# Patient Record
Sex: Male | Born: 1960
Health system: Southern US, Community
[De-identification: ages and names within clinical notes are randomized; demographics above are authoritative.]

## PROBLEM LIST (undated history)

## (undated) DIAGNOSIS — E785 Hyperlipidemia, unspecified: Secondary | ICD-10-CM

## (undated) DIAGNOSIS — I639 Cerebral infarction, unspecified: Secondary | ICD-10-CM

## (undated) DIAGNOSIS — I10 Essential (primary) hypertension: Secondary | ICD-10-CM

## (undated) DIAGNOSIS — M109 Gout, unspecified: Secondary | ICD-10-CM

## (undated) HISTORY — DX: Hyperlipidemia, unspecified: E78.5

## (undated) HISTORY — DX: Essential (primary) hypertension: I10

## (undated) HISTORY — PX: HERNIA REPAIR: SHX51

## (undated) HISTORY — PX: NO PAST SURGERIES: SHX2092

## (undated) HISTORY — DX: Cerebral infarction, unspecified: I63.9

## (undated) HISTORY — DX: Gout, unspecified: M10.9

---

## 2009-10-29 ENCOUNTER — Encounter: Payer: Self-pay | Admitting: Internal Medicine

## 2010-04-11 NOTE — Letter (Signed)
Summary: Previsit letter  [facility]  [address] [place], [place] [address]   Phone: [phone]  Fax: [phone]       10/29/2009 MRN: [id number]  Bruce Guerrero [address] [place], [place]  [address]  Dear Mr. Atienza,  Welcome to the Gastroenterology Division at [facility].    You are scheduled to see a nurse for your pre-procedure visit on 12-14-09 at 1:30pm on the 3rd floor at [facility], [address]  We ask that you try to arrive at our office 15 minutes prior to your appointment time to allow for check-in.  Your nurse visit will consist of discussing your medical and surgical history, your immediate family medical history, and your medications.    Please bring a complete list of all your medications or, if you prefer, bring the medication bottles and we will list them.  We will need to be aware of both prescribed and over the counter drugs.  We will need to know exact dosage information as well.  If you are on blood thinners (Coumadin, Plavix, Aggrenox, Ticlid, etc.) please call our office today/prior to your appointment, as we need to consult with your physician about holding your medication.   Please be prepared to read and sign documents such as consent forms, a financial agreement, and acknowledgement forms.  If necessary, and with your consent, a friend or relative is welcome to sit-in on the nurse visit with you.  Please bring your insurance card so that we may make a copy of it.  If your insurance requires a referral to see a specialist, please bring your referral form from your primary care physician.  No co-pay is required for this nurse visit.     If you cannot keep your appointment, please call [phone] to cancel or reschedule prior to your appointment date.  This allows [name] the opportunity to schedule an appointment for another patient in need of care.    Thank you for choosing [facility] for your medical needs.   We appreciate the opportunity to care for you.  Please visit [name] at our website  to learn more about our practice.                     Sincerely.                                                                                                                   The Gastroenterology Division

## 2012-03-10 DIAGNOSIS — I639 Cerebral infarction, unspecified: Secondary | ICD-10-CM

## 2012-03-10 HISTORY — DX: Cerebral infarction, unspecified: I63.9

## 2012-07-05 ENCOUNTER — Other Ambulatory Visit: Payer: Self-pay | Admitting: *Deleted

## 2012-07-05 MED ORDER — COLCHICINE 0.6 MG PO TABS: ORAL_TABLET | ORAL | Status: DC

## 2012-07-05 NOTE — Telephone Encounter (Signed)
LAST SEEN 12/10/11

## 2012-08-04 ENCOUNTER — Other Ambulatory Visit: Payer: Self-pay | Admitting: Family Medicine

## 2012-08-05 ENCOUNTER — Ambulatory Visit (INDEPENDENT_AMBULATORY_CARE_PROVIDER_SITE_OTHER): Payer: BC Managed Care – PPO | Admitting: Physician Assistant

## 2012-08-05 ENCOUNTER — Encounter: Payer: Self-pay | Admitting: Physician Assistant

## 2012-08-05 ENCOUNTER — Telehealth: Payer: Self-pay | Admitting: Nurse Practitioner

## 2012-08-05 VITALS — BP 179/89 | HR 79 | Temp 99.0°F | Ht 64.5 in | Wt 200.2 lb

## 2012-08-05 DIAGNOSIS — I10 Essential (primary) hypertension: Secondary | ICD-10-CM | POA: Insufficient documentation

## 2012-08-05 DIAGNOSIS — M109 Gout, unspecified: Secondary | ICD-10-CM

## 2012-08-05 MED ORDER — PREDNISONE (PAK) 10 MG PO TABS: 10.0000 mg | ORAL_TABLET | Freq: Every day | ORAL | Status: DC

## 2012-08-05 MED ORDER — COLCHICINE 0.6 MG PO TABS: ORAL_TABLET | ORAL | Status: DC

## 2012-08-05 NOTE — Patient Instructions (Signed)
Gout  Gout is an inflammatory condition (arthritis) caused by a buildup of uric acid crystals in the joints. Uric acid is a chemical that is normally present in the blood. Under some circumstances, uric acid can form into crystals in your joints. This causes joint redness, soreness, and swelling (inflammation). Repeat attacks are common. Over time, uric acid crystals can form into masses (tophi) near a joint, causing disfigurement. Gout is treatable and often preventable.  CAUSES   The disease begins with elevated levels of uric acid in the blood. Uric acid is produced by your body when it breaks down a naturally found substance called purines. This also happens when you eat certain foods such as meats and fish. Causes of an elevated uric acid level include:   Being passed down from parent to child (heredity).   Diseases that cause increased uric acid production (obesity, psoriasis, some cancers).   Excessive alcohol use.   Diet, especially diets rich in meat and seafood.   Medicines, including certain cancer-fighting drugs (chemotherapy), diuretics, and aspirin.   Chronic kidney disease. The kidneys are no longer able to remove uric acid well.   Problems with metabolism.  Conditions strongly associated with gout include:   Obesity.   High blood pressure.   High cholesterol.   Diabetes.  Not everyone with elevated uric acid levels gets gout. It is not understood why some people get gout and others do not. Surgery, joint injury, and eating too much of certain foods are some of the factors that can lead to gout.  SYMPTOMS    An attack of gout comes on quickly. It causes intense pain with redness, swelling, and warmth in a joint.   Fever can occur.   Often, only one joint is involved. Certain joints are more commonly involved:   Base of the big toe.   Knee.   Ankle.   Wrist.   Finger.  Without treatment, an attack usually goes away in a few days to weeks. Between attacks, you usually will not have  symptoms, which is different from many other forms of arthritis.  DIAGNOSIS   Your caregiver will suspect gout based on your symptoms and exam. Removal of fluid from the joint (arthrocentesis) is done to check for uric acid crystals. Your caregiver will give you a medicine that numbs the area (local anesthetic) and use a needle to remove joint fluid for exam. Gout is confirmed when uric acid crystals are seen in joint fluid, using a special microscope. Sometimes, blood, urine, and X-ray tests are also used.  TREATMENT   There are 2 phases to gout treatment: treating the sudden onset (acute) attack and preventing attacks (prophylaxis).  Treatment of an Acute Attack   Medicines are used. These include anti-inflammatory medicines or steroid medicines.   An injection of steroid medicine into the affected joint is sometimes necessary.   The painful joint is rested. Movement can worsen the arthritis.   You may use warm or cold treatments on painful joints, depending which works best for you.   Discuss the use of coffee, vitamin C, or cherries with your caregiver. These may be helpful treatment options.  Treatment to Prevent Attacks  After the acute attack subsides, your caregiver may advise prophylactic medicine. These medicines either help your kidneys eliminate uric acid from your body or decrease your uric acid production. You may need to stay on these medicines for a very long time.  The early phase of treatment with prophylactic medicine can be associated   with an increase in acute gout attacks. For this reason, during the first few months of treatment, your caregiver may also advise you to take medicines usually used for acute gout treatment. Be sure you understand your caregiver's directions.  You should also discuss dietary treatment with your caregiver. Certain foods such as meats and fish can increase uric acid levels. Other foods such as dairy can decrease levels. Your caregiver can give you a list of foods  to avoid.  HOME CARE INSTRUCTIONS    Do not take aspirin to relieve pain. This raises uric acid levels.   Only take over-the-counter or prescription medicines for pain, discomfort, or fever as directed by your caregiver.   Rest the joint as much as possible. When in bed, keep sheets and blankets off painful areas.   Keep the affected joint raised (elevated).   Use crutches if the painful joint is in your leg.   Drink enough water and fluids to keep your urine clear or pale yellow. This helps your body get rid of uric acid. Do not drink alcoholic beverages. They slow the passage of uric acid.   Follow your caregiver's dietary instructions. Pay careful attention to the amount of protein you eat. Your daily diet should emphasize fruits, vegetables, whole grains, and fat-free or low-fat milk products.   Maintain a healthy body weight.  SEEK MEDICAL CARE IF:    You have an oral temperature above 102 F (38.9 C).   You develop diarrhea, vomiting, or any side effects from medicines.   You do not feel better in 24 hours, or you are getting worse.  SEEK IMMEDIATE MEDICAL CARE IF:    Your joint becomes suddenly more tender and you have:   Chills.   An oral temperature above 102 F (38.9 C), not controlled by medicine.  MAKE SURE YOU:    Understand these instructions.   Will watch your condition.   Will get help right away if you are not doing well or get worse.  Document Released: 02/22/2000 Document Revised: 05/19/2011 Document Reviewed: 06/04/2009  [employer] Patient Information 2014 [employer].

## 2012-08-05 NOTE — Telephone Encounter (Signed)
appt made

## 2012-08-05 NOTE — Progress Notes (Signed)
Subjective:     Patient ID: Bruce Guerrero, male   DOB: 1960/10/28, 52 y.o.   MRN: [id number]  HPI Pt with a flare of his gout to the L wrist + Pain and swelling Hx of same to the same wrist He has tried Indocin but this causes N/V Prefers Colchicine Pt admits to large caff intake and poor water intake  Review of Systems  All other systems reviewed and are negative.       Objective:   Physical Exam  Nursing note and vitals reviewed. + edema to the L wrist + TTP entire wrist to the thenar area Decrease in ROM due to pain Good pulses/sensory Good ROM fingers     Assessment:     1. Gout   2. HTN (hypertension)        Plan:     Stressed the importance of hydrating Try to minimize caff Rf of meds to pharmacy Keep reg f/u for HTN

## 2012-08-20 ENCOUNTER — Encounter: Payer: Self-pay | Admitting: Family Medicine

## 2012-08-20 ENCOUNTER — Ambulatory Visit (INDEPENDENT_AMBULATORY_CARE_PROVIDER_SITE_OTHER): Payer: BC Managed Care – PPO | Admitting: Family Medicine

## 2012-08-20 ENCOUNTER — Telehealth: Payer: Self-pay | Admitting: General Practice

## 2012-08-20 VITALS — BP 164/86 | HR 82 | Temp 98.4°F | Ht 64.0 in | Wt 194.0 lb

## 2012-08-20 DIAGNOSIS — H109 Unspecified conjunctivitis: Secondary | ICD-10-CM

## 2012-08-20 MED ORDER — POLYMYXIN B-TRIMETHOPRIM 10000-0.1 UNIT/ML-% OP SOLN: 1.0000 [drp] | OPHTHALMIC | Status: DC

## 2012-08-20 NOTE — Telephone Encounter (Signed)
appt given  

## 2012-08-20 NOTE — Patient Instructions (Signed)

## 2012-08-20 NOTE — Progress Notes (Signed)
  Subjective:    Patient ID: Bruce Guerrero, male    DOB: Nov 02, 1960, 52 y.o.   MRN: [id number]  HPI This 52 y.o. male presents for evaluation of left eye irritation and discomfort.  He states he was drilling a plexiglass yesterday but didn't remember any trauma or injury.   Review of Systems No chest pain, SOB, HA, dizziness, vision change, N/V, diarrhea, constipation, dysuria, urinary urgency or frequency, myalgias, arthralgias or rash.      Objective:   Physical Exam Vital signs noted  Well developed well nourished male.  HEENT - Head atraumatic Normocephalic                Eyes - PERRLA, Conjuctiva - Injected OS and no FB is seen.  Flouresciene exam- Normal OS. EOMI.                Ears - EAC's Wnl TM's Wnl Gross Hearing WNL                Nose - Nares patent                 Throat - oropharanx wnl Respiratory - Lungs CTA bilateral Cardiac - RRR S1 and S2 without murmur        Assessment & Plan:  Conjunctivitis Polytrim eye gtt's. If not better then follow up prn.

## 2012-09-28 ENCOUNTER — Other Ambulatory Visit: Payer: Self-pay | Admitting: *Deleted

## 2012-09-28 DIAGNOSIS — M109 Gout, unspecified: Secondary | ICD-10-CM

## 2012-09-28 NOTE — Telephone Encounter (Signed)
Patient last seen on 08-21-12. Please advise.

## 2012-09-29 ENCOUNTER — Other Ambulatory Visit: Payer: Self-pay | Admitting: Nurse Practitioner

## 2012-09-30 ENCOUNTER — Other Ambulatory Visit: Payer: Self-pay | Admitting: Family Medicine

## 2012-09-30 DIAGNOSIS — M109 Gout, unspecified: Secondary | ICD-10-CM

## 2012-09-30 MED ORDER — COLCHICINE 0.6 MG PO TABS: ORAL_TABLET | ORAL | Status: DC

## 2012-09-30 NOTE — Telephone Encounter (Signed)
Colchicine sent to pharmacyp

## 2012-12-01 ENCOUNTER — Encounter: Payer: Self-pay | Admitting: General Practice

## 2012-12-01 ENCOUNTER — Other Ambulatory Visit: Payer: Self-pay | Admitting: Family Medicine

## 2012-12-01 ENCOUNTER — Ambulatory Visit (INDEPENDENT_AMBULATORY_CARE_PROVIDER_SITE_OTHER): Payer: BC Managed Care – PPO | Admitting: General Practice

## 2012-12-01 VITALS — BP 184/99 | HR 85 | Temp 98.5°F | Ht 64.0 in | Wt 198.5 lb

## 2012-12-01 DIAGNOSIS — M109 Gout, unspecified: Secondary | ICD-10-CM

## 2012-12-01 DIAGNOSIS — I10 Essential (primary) hypertension: Secondary | ICD-10-CM

## 2012-12-01 MED ORDER — PREDNISONE (PAK) 10 MG PO TABS: ORAL_TABLET | ORAL | Status: DC

## 2012-12-01 MED ORDER — AMLODIPINE BESYLATE 10 MG PO TABS: 10.0000 mg | ORAL_TABLET | Freq: Every day | ORAL | Status: DC

## 2012-12-01 MED ORDER — COLCHICINE 0.6 MG PO TABS: ORAL_TABLET | ORAL | Status: DC

## 2012-12-01 NOTE — Patient Instructions (Addendum)
Gout  Gout is an inflammatory condition (arthritis) caused by a buildup of uric acid crystals in the joints. Uric acid is a chemical that is normally present in the blood. Under some circumstances, uric acid can form into crystals in your joints. This causes joint redness, soreness, and swelling (inflammation). Repeat attacks are common. Over time, uric acid crystals can form into masses (tophi) near a joint, causing disfigurement. Gout is treatable and often preventable.  CAUSES   The disease begins with elevated levels of uric acid in the blood. Uric acid is produced by your body when it breaks down a naturally found substance called purines. This also happens when you eat certain foods such as meats and fish. Causes of an elevated uric acid level include:   Being passed down from parent to child (heredity).   Diseases that cause increased uric acid production (obesity, psoriasis, some cancers).   Excessive alcohol use.   Diet, especially diets rich in meat and seafood.   Medicines, including certain cancer-fighting drugs (chemotherapy), diuretics, and aspirin.   Chronic kidney disease. The kidneys are no longer able to remove uric acid well.   Problems with metabolism.  Conditions strongly associated with gout include:   Obesity.   High blood pressure.   High cholesterol.   Diabetes.  Not everyone with elevated uric acid levels gets gout. It is not understood why some people get gout and others do not. Surgery, joint injury, and eating too much of certain foods are some of the factors that can lead to gout.  SYMPTOMS    An attack of gout comes on quickly. It causes intense pain with redness, swelling, and warmth in a joint.   Fever can occur.   Often, only one joint is involved. Certain joints are more commonly involved:   Base of the big toe.   Knee.   Ankle.   Wrist.   Finger.  Without treatment, an attack usually goes away in a few days to weeks. Between attacks, you usually will not have  symptoms, which is different from many other forms of arthritis.  DIAGNOSIS   Your caregiver will suspect gout based on your symptoms and exam. Removal of fluid from the joint (arthrocentesis) is done to check for uric acid crystals. Your caregiver will give you a medicine that numbs the area (local anesthetic) and use a needle to remove joint fluid for exam. Gout is confirmed when uric acid crystals are seen in joint fluid, using a special microscope. Sometimes, blood, urine, and X-ray tests are also used.  TREATMENT   There are 2 phases to gout treatment: treating the sudden onset (acute) attack and preventing attacks (prophylaxis).  Treatment of an Acute Attack   Medicines are used. These include anti-inflammatory medicines or steroid medicines.   An injection of steroid medicine into the affected joint is sometimes necessary.   The painful joint is rested. Movement can worsen the arthritis.   You may use warm or cold treatments on painful joints, depending which works best for you.   Discuss the use of coffee, vitamin C, or cherries with your caregiver. These may be helpful treatment options.  Treatment to Prevent Attacks  After the acute attack subsides, your caregiver may advise prophylactic medicine. These medicines either help your kidneys eliminate uric acid from your body or decrease your uric acid production. You may need to stay on these medicines for a very long time.  The early phase of treatment with prophylactic medicine can be associated   with an increase in acute gout attacks. For this reason, during the first few months of treatment, your caregiver may also advise you to take medicines usually used for acute gout treatment. Be sure you understand your caregiver's directions.  You should also discuss dietary treatment with your caregiver. Certain foods such as meats and fish can increase uric acid levels. Other foods such as dairy can decrease levels. Your caregiver can give you a list of foods  to avoid.  HOME CARE INSTRUCTIONS    Do not take aspirin to relieve pain. This raises uric acid levels.   Only take over-the-counter or prescription medicines for pain, discomfort, or fever as directed by your caregiver.   Rest the joint as much as possible. When in bed, keep sheets and blankets off painful areas.   Keep the affected joint raised (elevated).   Use crutches if the painful joint is in your leg.   Drink enough water and fluids to keep your urine clear or pale yellow. This helps your body get rid of uric acid. Do not drink alcoholic beverages. They slow the passage of uric acid.   Follow your caregiver's dietary instructions. Pay careful attention to the amount of protein you eat. Your daily diet should emphasize fruits, vegetables, whole grains, and fat-free or low-fat milk products.   Maintain a healthy body weight.  SEEK MEDICAL CARE IF:    You have an oral temperature above 102 F (38.9 C).   You develop diarrhea, vomiting, or any side effects from medicines.   You do not feel better in 24 hours, or you are getting worse.  SEEK IMMEDIATE MEDICAL CARE IF:    Your joint becomes suddenly more tender and you have:   Chills.   An oral temperature above 102 F (38.9 C), not controlled by medicine.  MAKE SURE YOU:    Understand these instructions.   Will watch your condition.   Will get help right away if you are not doing well or get worse.  Document Released: 02/22/2000 Document Revised: 05/19/2011 Document Reviewed: 06/04/2009  [employer] Patient Information 2014 [employer].

## 2012-12-01 NOTE — Progress Notes (Signed)
  Subjective:    Patient ID: Bruce Guerrero, male    DOB: [birth year], 52 y.o.   MRN: [id number]  HPI Patient presents today with complaints of gout pain, in his left elbow and wrist. He reports onset was Monday night. Reports having this gouty pain since age 52 or 52. He reports colcrys and prednisone is very effective. He reports redness, warmth, and swelling to elbow and wrist area.     Review of Systems  Constitutional: Negative for fever and chills.  Respiratory: Negative for chest tightness and shortness of breath.   Cardiovascular: Negative for chest pain and palpitations.  Genitourinary: Negative for difficulty urinating.  Musculoskeletal: Positive for joint swelling.       Left elbow swelling       Objective:   Physical Exam  Constitutional: He is oriented to person, place, and time. He appears well-developed and well-nourished.  Cardiovascular: Normal rate, regular rhythm and normal heart sounds.   Pulmonary/Chest: Effort normal and breath sounds normal. No respiratory distress. He exhibits no tenderness.  Neurological: He is alert and oriented to person, place, and time.  Skin: Skin is warm and dry.  Erythema, warmth, and edema noted to left elbow. Slight edema noted to left wrist area.   Psychiatric: He has a normal mood and affect.          Assessment & Plan:  1. Gouty arthritis - colchicine (COLCRYS) 0.6 MG tablet; TAKE 2 TABLETS AT ONSET, THEN 1 BY MOUTH 1 HOUR LATER  Dispense: 30 tablet; Refill: 2 - predniSONE (STERAPRED UNI-PAK) 10 MG tablet; Take as directed  Dispense: 21 tablet; Refill: 0  2. Hypertension - amLODipine (NORVASC) 10 MG tablet; Take 1 tablet (10 mg total) by mouth daily.  Dispense: 30 tablet; Refill: 2 -take medication as directed and don't allow self to run out of medications -work on healthy eating and regular exercise -RTO if symptoms worsen or unresolved -Patient verbalized understanding -Coralie Keens, FNP-C

## 2013-01-31 ENCOUNTER — Encounter: Payer: Self-pay | Admitting: Nurse Practitioner

## 2013-01-31 ENCOUNTER — Ambulatory Visit (INDEPENDENT_AMBULATORY_CARE_PROVIDER_SITE_OTHER): Payer: BC Managed Care – PPO | Admitting: Nurse Practitioner

## 2013-01-31 VITALS — BP 190/97 | HR 73 | Temp 97.7°F | Ht 64.0 in | Wt 193.0 lb

## 2013-01-31 DIAGNOSIS — I10 Essential (primary) hypertension: Secondary | ICD-10-CM

## 2013-01-31 DIAGNOSIS — R531 Weakness: Secondary | ICD-10-CM

## 2013-01-31 DIAGNOSIS — M6281 Muscle weakness (generalized): Secondary | ICD-10-CM

## 2013-01-31 NOTE — Progress Notes (Signed)
  Subjective:    Patient ID: Bruce Guerrero, male    DOB: 05-16-1960, 52 y.o.   MRN: [id number]  HPI Patient was awoken during the night and felt numb down his right side-went back to sleep and woke up fine- later today it occurred again- he siad he was standing up at work and he went to take a step and his right leg felt out of control and shortly after his right arm went numb as well. Denies vision problems or slurred speech.    Review of Systems  All other systems reviewed and are negative.       Objective:   Physical Exam  Constitutional: He is oriented to person, place, and time. He appears well-developed and well-nourished.  HENT:  Head: Normocephalic.  Right Ear: External ear normal.  Left Ear: External ear normal.  Nose: Nose normal.  Mouth/Throat: Oropharynx is clear and moist.  Eyes: Conjunctivae and EOM are normal. Pupils are equal, round, and reactive to light.  Neck: Normal range of motion. Neck supple.  Cardiovascular: Normal rate, regular rhythm and normal heart sounds.   Pulmonary/Chest: Effort normal and breath sounds normal.  Abdominal: Soft. Bowel sounds are normal.  Musculoskeletal: Normal range of motion.  Neurological: He is alert and oriented to person, place, and time. He has normal reflexes. No cranial nerve deficit.  Skin: Skin is warm.  Psychiatric: He has a normal mood and affect. His behavior is normal. Judgment and thought content normal.    BP 190/97  Pulse 73  Temp(Src) 97.7 F (36.5 C) (Oral)  Ht 5\' 4"  (1.626 m)  Wt 193 lb (87.544 kg)  BMI 33.11 kg/m2       Assessment & Plan:   1. Right sided weakness   2. Hypertension    To er- son to take him to [facility], FNP

## 2013-01-31 NOTE — Patient Instructions (Signed)
Ischemic Stroke A stroke (cerebrovascular accident) is the sudden death of brain tissue. It is a medical emergency. A stroke can cause permanent loss of brain function. This can cause problems with different parts of your body. A transient ischemic attack (TIA) is different because it does not cause permanent damage. A TIA is a short-lived problem of poor blood flow affecting a part of the brain. A TIA is also a serious problem because having a TIA greatly increases the chances of having a stroke. When symptoms first develop, you cannot know if the problem might be a stroke or TIA. CAUSES  A stroke is caused by a decrease of oxygen supply to an area of your brain. It is usually the result of a small blood clot or collection of cholesterol or fat (plaque) that blocks blood flow in the brain. A stroke can also be caused by blocked or damaged carotid arteries.  RISK FACTORS  High blood pressure (hypertension).  High cholesterol.  Diabetes mellitus.  Heart disease.  The build up of plaque in the blood vessels (peripheral artery disease or atherosclerosis).  The build up of plaque in the blood vessels providing blood and oxygen to the brain (carotid artery stenosis).  An abnormal heart rhythm (atrial fibrillation).  Obesity.  Smoking.  Taking oral contraceptives (especially in combination with smoking).  Physical inactivity.  A diet high in fats, salt (sodium), and calories.  Alcohol use.  Use of illegal drugs (especially cocaine and methamphetamine).  Being African American.  Being over the age of 55.  Family history of stroke.  Previous history of blood clots, stroke, TIA, or heart attack.  Sickle cell disease. SYMPTOMS  These symptoms usually develop suddenly, or may be newly present upon awakening from sleep:  Sudden weakness or numbness of the face, arm, or leg, especially on one side of the body.  Sudden trouble walking or difficulty moving arms or legs.  Sudden  confusion.  Sudden personality changes.  Trouble speaking (aphasia) or understanding.  Difficulty swallowing.  Sudden trouble seeing in one or both eyes.  Double vision.  Dizziness.  Loss of balance or coordination.  Sudden severe headache with no known cause.  Trouble reading or writing. DIAGNOSIS  Your caregiver can often determine the presence or absence of a stroke based on your symptoms, history, and physical exam. Computed tomography (CT) of the brain is usually performed to confirm the stroke, determine causes, and determine stroke severity. Other tests may be done to find the cause of the stroke. These tests may include:  Electrocardiography.  Continuous heart monitoring.  Echocardiography.  Carotid ultrasonography.  Magnetic resonance imaging (MRI).  A scan of the brain circulation.  Blood tests. PREVENTION  The risk of a stroke can be decreased by appropriately treating high blood pressure, high cholesterol, diabetes, heart disease, and obesity and by quitting smoking, limiting alcohol, and staying physically active. TREATMENT  Time is of the essence. It is important to seek treatment within 3 4 hours of the start of symptoms because you may receive a medicine to dissolve the clot (thrombolytic) that cannot be given after that time. Even if you do not know when your symptoms began, get treatment as soon as possible. After the 4 hour window has passed, treatment may include rest, oxygen, intravenous (IV) fluids, and medicines to thin the blood (anticoagulants). Treatment of stroke depends on the duration, severity, and cause of your symptoms. Medicines and diet may be used to address diabetes, high blood pressure, and other risk   factors. Physical, speech, and occupational therapists will assess you and work to improve any functions impaired by the stroke. Measures will be taken to prevent short-term and long-term complications, including infection from breathing  foreign material into the lungs (aspiration pneumonia), blood clots in the legs, bedsores, and falls. Rarely, surgery may be needed to remove large blood clots or to open up blocked arteries. HOME CARE INSTRUCTIONS   Take all medicines prescribed by your caregiver. Follow the directions carefully. Medicines may be used to control risk factors for a stroke. Be sure you understand all your medicine instructions.  You may be told to take aspirin or the anticoagulant warfarin. Warfarin needs to be taken exactly as instructed.  Too much and too little warfarin are both dangerous. Too much warfarin increases the risk of bleeding. Too little warfarin continues to allow the risk for blood clots. While taking warfarin, you will need to have regular blood tests to measure your blood clotting time. These blood tests usually include both the PT and INR tests. The PT and INR results allow your caregiver to adjust your dose of warfarin. The dose can change for many reasons. It is critically important that you take warfarin exactly as prescribed, and that you have your PT and INR levels drawn exactly as directed.  Many foods, especially foods high in vitamin K can interfere with warfarin and affect the PT and INR results. Foods high in vitamin K include spinach, kale, broccoli, cabbage, collard and turnip greens, brussels sprouts, peas, cauliflower, seaweed, and parsley as well as beef and pork liver, green tea, and soybean oil. You should eat a consistent amount of foods high in vitamin K. Avoid major changes in your diet, or notify your caregiver before changing your diet. Arrange a visit with a dietitian to answer your questions.  Many medicines can interfere with warfarin and affect the PT and INR results. You must tell your caregiver about any and all medicines you take, this includes all vitamins and supplements. Be especially cautious with aspirin and anti-inflammatory medicines. Do not take or discontinue any  prescribed or over-the-counter medicine except on the advice of your caregiver or pharmacist.  Warfarin can have side effects, such as excessive bruising or bleeding. You will need to hold pressure over cuts for longer than usual. Your caregiver or pharmacist will discuss other potential side effects.  Avoid sports or activities that may cause injury or bleeding.  Be mindful when shaving, flossing your teeth, or handling sharp objects.  Alcohol can change the body's ability to handle warfarin. It is best to avoid alcoholic drinks or consume only very small amounts while taking warfarin. Notify your caregiver if you change your alcohol intake.  Notify your dentist or other caregivers before procedures.  If swallow studies have determined that your swallowing reflex is present, you should eat healthy foods. A diet that includes 5 or more servings of fruits and vegetables a day may reduce the risk of stroke. Foods may need to be a special consistency (soft or pureed), or small bites may need to be taken in order to avoid aspirating or choking. Certain diets may be prescribed to address high blood pressure, high cholesterol, diabetes, or obesity.  A low-sodium, low-saturated fat, low-trans fat, low-cholesterol diet is recommended to manage high blood pressure.  A low-saturated fat, low-trans fat, low-cholesterol, and high-fiber diet may control cholesterol levels.  A controlled-carbohydrate, controlled-sugar diet is recommended to manage diabetes.  A reduced-calorie, low-sodium, low-saturated fat, low-trans fat, low-cholesterol diet   is recommended to manage obesity.  Maintain a healthy weight.  Stay physically active. It is recommended that you get at least 30 minutes of activity on most or all days.  Do not smoke.  Limit alcohol use even if you are not taking warfarin. Moderate alcohol use is considered to be:  No more than 2 drinks each day for men.  No more than 1 drink each day for  nonpregnant women.  Stop drug abuse.  Home safety. A safe home environment is important to reduce the risk of falls. Your caregiver may arrange for specialists to evaluate your home. Having grab bars in the bedroom and bathroom is often important. Your caregiver may arrange for equipment to be used at home, such as raised toilets and a seat for the shower.  Physical, occupational, and speech therapy. Ongoing therapy may be needed to maximize your recovery after a stroke. If you have been advised to use a walker or a cane, use it at all times. Be sure to keep your therapy appointments.  Follow all instructions for follow-up with your caregiver. This is very important. This includes any referrals, physical therapy, rehabilitation, and lab tests. Proper follow up can prevent another stroke from occurring. SEEK MEDICAL CARE IF:  You have personality changes.  You have difficulty swallowing.  You are seeing double.  You have dizziness.  You have a fever.  You have skin breakdown. SEEK IMMEDIATE MEDICAL CARE IF:  Any of these symptoms may represent a serious problem that is an emergency. Do not wait to see if the symptoms will go away. Get medical help right away. Call your local emergency services (911 in [place] Do not drive yourself to the hospital.  You have sudden weakness or numbness of the face, arm, or leg, especially on one side of the body.  You have sudden trouble walking or difficulty moving arms or legs.  You have sudden confusion.  You have trouble speaking (aphasia) or understanding.  You have sudden trouble seeing in one or both eyes.  You have a loss of balance or coordination.  You have a sudden, severe headache with no known cause.  You have new chest pain or an irregular heartbeat.  You have a partial or total loss of consciousness.   Document Released: 02/24/2005 Document Revised: 10/27/2012 Document Reviewed: 10/05/2011 [employer] Patient Information 2014  [employer]  

## 2013-02-01 ENCOUNTER — Encounter (HOSPITAL_COMMUNITY): Payer: Self-pay | Admitting: Emergency Medicine

## 2013-02-01 ENCOUNTER — Inpatient Hospital Stay (HOSPITAL_COMMUNITY)
Admission: EM | Admit: 2013-02-01 | Discharge: 2013-02-08 | DRG: 065 | Disposition: A | Discharge status: Rehabilitation Facility | Payer: BC Managed Care – PPO | Attending: Family Medicine | Admitting: Family Medicine

## 2013-02-01 ENCOUNTER — Emergency Department (HOSPITAL_COMMUNITY): Payer: BC Managed Care – PPO

## 2013-02-01 ENCOUNTER — Inpatient Hospital Stay (HOSPITAL_COMMUNITY): Payer: BC Managed Care – PPO

## 2013-02-01 DIAGNOSIS — G8100 Flaccid hemiplegia affecting unspecified side: Secondary | ICD-10-CM | POA: Diagnosis present

## 2013-02-01 DIAGNOSIS — Z79899 Other long term (current) drug therapy: Secondary | ICD-10-CM

## 2013-02-01 DIAGNOSIS — M109 Gout, unspecified: Secondary | ICD-10-CM | POA: Diagnosis present

## 2013-02-01 DIAGNOSIS — Z7982 Long term (current) use of aspirin: Secondary | ICD-10-CM

## 2013-02-01 DIAGNOSIS — I639 Cerebral infarction, unspecified: Secondary | ICD-10-CM | POA: Diagnosis present

## 2013-02-01 DIAGNOSIS — I6330 Cerebral infarction due to thrombosis of unspecified cerebral artery: Principal | ICD-10-CM | POA: Diagnosis present

## 2013-02-01 DIAGNOSIS — R1310 Dysphagia, unspecified: Secondary | ICD-10-CM | POA: Diagnosis present

## 2013-02-01 DIAGNOSIS — Z836 Family history of other diseases of the respiratory system: Secondary | ICD-10-CM

## 2013-02-01 DIAGNOSIS — R29810 Facial weakness: Secondary | ICD-10-CM | POA: Diagnosis present

## 2013-02-01 DIAGNOSIS — Z23 Encounter for immunization: Secondary | ICD-10-CM

## 2013-02-01 DIAGNOSIS — I6789 Other cerebrovascular disease: Secondary | ICD-10-CM | POA: Diagnosis present

## 2013-02-01 DIAGNOSIS — E785 Hyperlipidemia, unspecified: Secondary | ICD-10-CM | POA: Diagnosis present

## 2013-02-01 DIAGNOSIS — F17200 Nicotine dependence, unspecified, uncomplicated: Secondary | ICD-10-CM | POA: Diagnosis present

## 2013-02-01 DIAGNOSIS — M62838 Other muscle spasm: Secondary | ICD-10-CM | POA: Diagnosis present

## 2013-02-01 DIAGNOSIS — I10 Essential (primary) hypertension: Secondary | ICD-10-CM | POA: Diagnosis present

## 2013-02-01 DIAGNOSIS — I517 Cardiomegaly: Secondary | ICD-10-CM

## 2013-02-01 DIAGNOSIS — N181 Chronic kidney disease, stage 1: Secondary | ICD-10-CM | POA: Diagnosis present

## 2013-02-01 DIAGNOSIS — Z833 Family history of diabetes mellitus: Secondary | ICD-10-CM

## 2013-02-01 DIAGNOSIS — I129 Hypertensive chronic kidney disease with stage 1 through stage 4 chronic kidney disease, or unspecified chronic kidney disease: Secondary | ICD-10-CM | POA: Diagnosis present

## 2013-02-01 DIAGNOSIS — R4701 Aphasia: Secondary | ICD-10-CM | POA: Diagnosis present

## 2013-02-01 DIAGNOSIS — I6350 Cerebral infarction due to unspecified occlusion or stenosis of unspecified cerebral artery: Secondary | ICD-10-CM

## 2013-02-01 LAB — RAPID URINE DRUG SCREEN, HOSP PERFORMED
Amphetamines: NOT DETECTED
Benzodiazepines: NOT DETECTED
Cocaine: NOT DETECTED
Opiates: NOT DETECTED
Tetrahydrocannabinol: NOT DETECTED

## 2013-02-01 LAB — URINE MICROSCOPIC-ADD ON

## 2013-02-01 LAB — COMPREHENSIVE METABOLIC PANEL
AST: 17 U/L (ref 0–37)
Albumin: 4.4 g/dL (ref 3.5–5.2)
BUN: 15 mg/dL (ref 6–23)
Chloride: 100 mEq/L (ref 96–112)
Creatinine, Ser: 1.13 mg/dL (ref 0.50–1.35)
Glucose, Bld: 96 mg/dL (ref 70–99)
Potassium: 3.7 mEq/L (ref 3.5–5.1)
Total Bilirubin: 0.5 mg/dL (ref 0.3–1.2)
Total Protein: 7.5 g/dL (ref 6.0–8.3)

## 2013-02-01 LAB — URINALYSIS, ROUTINE W REFLEX MICROSCOPIC
Bilirubin Urine: NEGATIVE
Glucose, UA: NEGATIVE mg/dL
Ketones, ur: NEGATIVE mg/dL
Protein, ur: NEGATIVE mg/dL
Urobilinogen, UA: 0.2 mg/dL (ref 0.0–1.0)
pH: 6 (ref 5.0–8.0)

## 2013-02-01 LAB — DIFFERENTIAL
Basophils Absolute: 0 10*3/uL (ref 0.0–0.1)
Basophils Relative: 0 % (ref 0–1)
Eosinophils Absolute: 0.1 10*3/uL (ref 0.0–0.7)
Monocytes Absolute: 0.5 10*3/uL (ref 0.1–1.0)
Neutro Abs: 5.9 10*3/uL (ref 1.7–7.7)
Neutrophils Relative %: 67 % (ref 43–77)

## 2013-02-01 LAB — CBC
HCT: 48.5 % (ref 39.0–52.0)
MCH: 32.7 pg (ref 26.0–34.0)
MCHC: 35.7 g/dL (ref 30.0–36.0)
RDW: 13.3 % (ref 11.5–15.5)

## 2013-02-01 LAB — TROPONIN I
Troponin I: <0.3 ng/mL (ref <0.30)
Troponin I: <0.3 ng/mL (ref <0.30)

## 2013-02-01 LAB — HEMOGLOBIN A1C: Hgb A1c MFr Bld: 5.5 % (ref <5.7)

## 2013-02-01 LAB — POCT I-STAT, CHEM 8
BUN: 14 mg/dL (ref 6–23)
Calcium, Ion: 1.17 mmol/L (ref 1.12–1.23)
Chloride: 102 mEq/L (ref 96–112)
Creatinine, Ser: 1.3 mg/dL (ref 0.50–1.35)
Potassium: 3.8 mEq/L (ref 3.5–5.1)
Sodium: 141 mEq/L (ref 135–145)
TCO2: 27 mmol/L (ref 0–100)

## 2013-02-01 LAB — APTT: aPTT: 32 seconds (ref 24–37)

## 2013-02-01 LAB — ETHANOL: Alcohol, Ethyl (B): <11 mg/dL (ref 0–11)

## 2013-02-01 LAB — PROTIME-INR: INR: 0.92 (ref 0.00–1.49)

## 2013-02-01 MED ORDER — ASPIRIN 300 MG RE SUPP: 300.0000 mg | Freq: Every day | RECTAL | Status: DC

## 2013-02-01 MED ORDER — LORAZEPAM 2 MG/ML IJ SOLN
INTRAMUSCULAR | Status: AC
  Filled 2013-02-01: qty 1

## 2013-02-01 MED ORDER — LORAZEPAM 2 MG/ML IJ SOLN: 1.0000 mg | Freq: Once | INTRAMUSCULAR | Status: DC

## 2013-02-01 MED ORDER — ONDANSETRON HCL 4 MG PO TABS: 4.0000 mg | ORAL_TABLET | Freq: Four times a day (QID) | ORAL | Status: DC | PRN

## 2013-02-01 MED ORDER — SODIUM CHLORIDE 0.9 % IV SOLN
INTRAVENOUS | Status: AC
  Administered 2013-02-01: 11:00:00 via INTRAVENOUS

## 2013-02-01 MED ORDER — GADOBENATE DIMEGLUMINE 529 MG/ML IV SOLN
20.0000 mL | Freq: Once | INTRAVENOUS | Status: AC | PRN
  Administered 2013-02-01: 18 mL via INTRAVENOUS

## 2013-02-01 MED ORDER — ASPIRIN EC 325 MG PO TBEC
325.0000 mg | DELAYED_RELEASE_TABLET | Freq: Every day | ORAL | Status: DC
  Administered 2013-02-01 – 2013-02-02 (×2): 325 mg via ORAL
  Filled 2013-02-01 (×2): qty 1

## 2013-02-01 MED ORDER — HYDRALAZINE HCL 20 MG/ML IJ SOLN
10.0000 mg | Freq: Four times a day (QID) | INTRAMUSCULAR | Status: DC | PRN
  Administered 2013-02-01: 10 mg via INTRAVENOUS
  Filled 2013-02-01: qty 1

## 2013-02-01 MED ORDER — LORAZEPAM 2 MG/ML IJ SOLN
1.0000 mg | Freq: Once | INTRAMUSCULAR | Status: AC
  Administered 2013-02-01: 1 mg via INTRAVENOUS
  Filled 2013-02-01: qty 1

## 2013-02-01 MED ORDER — HEPARIN SODIUM (PORCINE) 5000 UNIT/ML IJ SOLN
5000.0000 [IU] | Freq: Three times a day (TID) | INTRAMUSCULAR | Status: DC
  Administered 2013-02-01 – 2013-02-08 (×20): 5000 [IU] via SUBCUTANEOUS
  Filled 2013-02-01 (×24): qty 1

## 2013-02-01 MED ORDER — HYDROCODONE-ACETAMINOPHEN 5-325 MG PO TABS
1.0000 | ORAL_TABLET | ORAL | Status: DC | PRN
  Administered 2013-02-02 – 2013-02-08 (×13): 2 via ORAL
  Filled 2013-02-01 (×13): qty 2

## 2013-02-01 MED ORDER — ONDANSETRON HCL 4 MG/2ML IJ SOLN: 4.0000 mg | Freq: Four times a day (QID) | INTRAMUSCULAR | Status: DC | PRN

## 2013-02-01 MED ORDER — ALBUTEROL SULFATE (5 MG/ML) 0.5% IN NEBU: 2.5000 mg | INHALATION_SOLUTION | RESPIRATORY_TRACT | Status: DC | PRN

## 2013-02-01 MED ORDER — INFLUENZA VAC SPLIT QUAD 0.5 ML IM SUSP
0.5000 mL | INTRAMUSCULAR | Status: AC
  Administered 2013-02-02: 0.5 mL via INTRAMUSCULAR
  Filled 2013-02-01: qty 0.5

## 2013-02-01 MED ORDER — POLYETHYLENE GLYCOL 3350 17 G PO PACK
17.0000 g | PACK | Freq: Every day | ORAL | Status: DC | PRN
  Filled 2013-02-01: qty 1

## 2013-02-01 MED ORDER — GUAIFENESIN-DM 100-10 MG/5ML PO SYRP: 5.0000 mL | ORAL_SOLUTION | ORAL | Status: DC | PRN

## 2013-02-01 NOTE — Progress Notes (Addendum)
Pt arrived 4N26 at 1343.

## 2013-02-01 NOTE — ED Notes (Signed)
Pt to department via EMS- pt reports that he was at work yesterday and started having some weakness in the right leg. Reports that the weakness has continued into today and now having the weakness in the right arm. Pt also has some facial droop. Bp-190/108 168/82 CBG-107

## 2013-02-01 NOTE — Progress Notes (Signed)
VASCULAR LAB PRELIMINARY  PRELIMINARY  PRELIMINARY  PRELIMINARY  Carotid duplex completed.    Preliminary report:  Bilateral:  1-39% ICA stenosis.  Vertebral artery flow is antegrade.     Joscelyn Hardrick, RVS 02/01/2013, 2:59 PM

## 2013-02-01 NOTE — ED Notes (Addendum)
Met Neuro i dr n [facility]. Pt will not be able to do MRI at this time pt having carotids done pt [id number] speaking w/ nurses and dr requesting oral swab

## 2013-02-01 NOTE — Progress Notes (Signed)
Called ED for report  

## 2013-02-01 NOTE — H&P (Signed)
[facility]                                                                                    Patient Demographics  Bruce Guerrero, is a 52 y.o. male  MRN: [id number]   DOB - [birth year]  Admit Date - 02/01/2013  Outpatient Primary MD for the patient is Rudi Heap, MD   With History of -  Past Medical History  Diagnosis Date  . Gout   . Hypertension       History reviewed. No pertinent past surgical history.  in for   Chief Complaint  Patient presents with  . Weakness     HPI  Bruce Guerrero  is a 52 y.o. male, history of hypertension and gout who is in good state of health until yesterday evening some time he started experiencing facial droop, slurred speech along with right-sided weakness, he was some reason did not seek medical attention until about 12-14 hours later this morning when he came to the ER with absolute flaccid paralysis on the right side, facial droop, dysarthria and some expressive aphasia, head CT was unremarkable, he was clinically diagnosed with acute CVA, his EKG had some nonspecific changes and I was requested to see the patient.   Patient currently besides his weakness and speech difficulty has no subjective complaints including no headache, no chest abdominal pain, no fever chills. No shortness of breath.    Review of Systems    In addition to the HPI above,  No Fever-chills, No Headache, No changes with Vision or hearing, No problems swallowing food or Liquids, No Chest pain, Cough or Shortness of Breath, No Abdominal pain, No Nausea or Vommitting, Bowel movements are regular, No Blood in stool or Urine, No dysuria, No new skin rashes or bruises, No new joints pains-aches,  No new weakness, tingling, numbness in any extremity, except right-sided weakness as above No recent weight gain or loss, No polyuria, polydypsia or polyphagia, No significant Mental Stressors.  A full 10 point Review of Systems was done, except as stated  above, all other Review of Systems were negative.   Social History History  Substance Use Topics  . Smoking status: Never Smoker   . Smokeless tobacco: Not on file  . Alcohol Use: Yes      Family History Family History  Problem Relation Age of Onset  . COPD Mother   . COPD Father   . Cancer Sister   . Hyperlipidemia Brother   . Diabetes Brother       Prior to Admission medications   Medication Sig Start Date End Date Taking? Authorizing Provider  amLODipine (NORVASC) 10 MG tablet Take 10 mg by mouth daily. 12/01/12  Yes Mae Shelda Altes, FNP  aspirin 325 MG tablet Take 325 mg by mouth daily as needed for mild pain.   Yes Historical Provider, MD  colchicine 0.6 MG tablet Take 0.6 mg by mouth daily. 12/01/12  Yes Mae Shelda Altes, FNP    No Known Allergies  Physical Exam  Vitals  Blood pressure 162/85, pulse 58, temperature 97.6 F (36.4 C), temperature source Oral, resp. rate 14, SpO2 96.00%.   1. General middle-aged  Caucasian male lying in hospital bed in no distress, has a visible facial droop  2. Normal affect and insight, Not Suicidal or Homicidal, Awake Alert, Oriented X 3.  3. No F.N deficits, ALL C.Nerves Intact, right sided strength is at best 1/5, 5 x 5 on the left  4. Ears and Eyes appear Normal, Conjunctivae clear, PERRLA. Moist Oral Mucosa.  5. Supple Neck, No JVD, No cervical lymphadenopathy appriciated, No Carotid Bruits.  6. Symmetrical Chest wall movement, Good air movement bilaterally, CTAB.  7. RRR, No Gallops, Rubs or Murmurs, No Parasternal Heave.  8. Positive Bowel Sounds, Abdomen Soft, Non tender, No organomegaly appriciated,No rebound -guarding or rigidity.  9.  No Cyanosis, Normal Skin Turgor, No Skin Rash or Bruise.  10. Good muscle tone,  joints appear normal , no effusions, Normal ROM.  11. No Palpable Lymph Nodes in Neck or Axillae     Data Review  CBC  Recent Labs Lab 02/01/13 0836 02/01/13 0911  WBC 8.8  --   HGB  17.3* 17.0  HCT 48.5 50.0  PLT 187  --   MCV 91.7  --   MCH 32.7  --   MCHC 35.7  --   RDW 13.3  --   LYMPHSABS 2.2  --   MONOABS 0.5  --   EOSABS 0.1  --   BASOSABS 0.0  --    ------------------------------------------------------------------------------------------------------------------  Chemistries   Recent Labs Lab 02/01/13 0836 02/01/13 0911  NA 139 141  K 3.7 3.8  CL 100 102  CO2 25  --   GLUCOSE 96 98  BUN 15 14  CREATININE 1.13 1.30  CALCIUM 9.8  --   AST 17  --   ALT 20  --   ALKPHOS 88  --   BILITOT 0.5  --    ------------------------------------------------------------------------------------------------------------------ CrCl is unknown because both a height and weight (above a minimum accepted value) are required for this calculation. ------------------------------------------------------------------------------------------------------------------ No results found for this basename: TSH, T4TOTAL, FREET3, T3FREE, THYROIDAB,  in the last 72 hours   Coagulation profile  Recent Labs Lab 02/01/13 0836  INR 0.92   ------------------------------------------------------------------------------------------------------------------- No results found for this basename: DDIMER,  in the last 72 hours -------------------------------------------------------------------------------------------------------------------  Cardiac Enzymes  Recent Labs Lab 02/01/13 0836  TROPONINI <0.30   ------------------------------------------------------------------------------------------------------------------ No components found with this basename: POCBNP,    ---------------------------------------------------------------------------------------------------------------  Urinalysis No results found for this basename: colorurine, appearanceur, labspec, phurine, glucoseu, hgbur, bilirubinur, ketonesur, proteinur, urobilinogen, nitrite, leukocytesur     ----------------------------------------------------------------------------------------------------------------  Imaging results:   Ct Head Wo Contrast  02/01/2013   CLINICAL DATA:  Right-sided weakness  EXAM: CT HEAD WITHOUT CONTRAST  TECHNIQUE: Contiguous axial images were obtained from the base of the skull through the vertex without intravenous contrast.  COMPARISON:  MRI of the brain dated September 20, 2008.  FINDINGS: The ventricles are normal in size and position. There is no intracranial hemorrhage nor intracranial mass effect. There is no evidence of an evolving ischemic infarction. The cerebellum and brainstem are normal in density. There are no abnormal intracranial calcifications.  At bone window settings the observed portions of the paranasal sinuses and mastoid air cells are clear. There is no evidence of an acute skull fracture.  IMPRESSION: 1. There is no evidence of an acute ischemic or hemorrhagic infarction. 2. There is no intracranial mass effect nor hydrocephalus. 3. If there is strong clinical concerns of acute ischemic processes, MRI would be useful if the patient can tolerate the procedure.  Electronically Signed   By: David  [place]   On: 02/01/2013 08:59    My personal review of EKG: Rhythm NSR, early repolarization along with some ST depression in lateral leads   Assessment & Plan    1. Clinical diagnoses of left MCA territory ischemic CVA - with dense right-sided hemiparesis, facial droop, some dysarthria and expressive aphasia. Neuro has been consulted, head CT is unremarkable, for now aspirin rectal suppository, will start full stroke workup including MRI MRA brain and neck, echogram, A1c, lipid panel, he will be seen by PT-OT-speech. Permissive hypertension for now.    2. Nonspecific EKG changes. Check echogram for #1 above, cycle troponins, aspirin should suffice for now. He is chest pain and chest symptom free.    3. History of hypertension and gout. No  acute issues. Holding home medications, allowing permissive hypertension, as needed IV hydralazine with written parameters. Once able to take by mouth we'll resume home dose colchicine.      DVT Prophylaxis  Lovenox   AM Labs Ordered, also please review Full Orders  Family Communication: Admission, patients condition and plan of care including tests being ordered have been discussed with the patient and son who indicate understanding and agree with the plan and Code Status.  Code Status full  Likely DC to  Rehab  Condition Fair  Time spent in minutes : 35    SINGH,PRASHANT K M.D on 02/01/2013 at 10:45 AM  Between 7am to 7pm - Pager - [phone]  After 7pm go to [email or web address] - password [email or web address]  And look for the night coverage person covering me after hours  [facility] Office  [phone]

## 2013-02-01 NOTE — ED Provider Notes (Signed)
CSN: [id number]     Arrival date & time 02/01/13  0804 History   First MD Initiated Contact with Patient 02/01/13 0809     Chief Complaint  Patient presents with  . Weakness   (Consider location/radiation/quality/duration/timing/severity/associated sxs/prior Treatment) HPI Comments: Patient presents with progressively worsening right-sided weakness that started yesterday morning. Last seen normal time was 10 AM yesterday. He developed some weakness in his right leg it progressed to his right arm and some facial droop and slurred speech. Denies any headache, chest pain, shortness of breath, nausea vomiting. Denies any vision change. Denies any difficulty swallowing. He has a history of hypertension and gout states compliance with his medications. He is a smoker.  The history is provided by the patient and the EMS personnel.    Past Medical History  Diagnosis Date  . Gout   . Hypertension    History reviewed. No pertinent past surgical history. Family History  Problem Relation Age of Onset  . COPD Mother   . COPD Father   . Cancer Sister   . Hyperlipidemia Brother   . Diabetes Brother    History  Substance Use Topics  . Smoking status: Never Smoker   . Smokeless tobacco: Not on file  . Alcohol Use: Yes    Review of Systems  Constitutional: Negative for fever, activity change and appetite change.  HENT: Negative for rhinorrhea.   Respiratory: Negative for cough, chest tightness and shortness of breath.   Cardiovascular: Negative for chest pain.  Gastrointestinal: Negative for nausea, vomiting and abdominal pain.  Genitourinary: Negative for dysuria and hematuria.  Musculoskeletal: Negative for back pain.  Skin: Negative for rash.  Neurological: Positive for weakness. Negative for dizziness, light-headedness and headaches.  A complete 10 system review of systems was obtained and all systems are negative except as noted in the HPI and PMH.    Allergies  Review of patient's  allergies indicates no known allergies.  Home Medications   Current Outpatient Rx  Name  Route  Sig  Dispense  Refill  . amLODipine (NORVASC) 10 MG tablet   Oral   Take 10 mg by mouth daily.         Marland Kitchen aspirin 325 MG tablet   Oral   Take 325 mg by mouth daily as needed for mild pain.         Marland Kitchen colchicine 0.6 MG tablet   Oral   Take 0.6 mg by mouth daily.          BP 199/97  Pulse 89  Temp(Src) 97.6 F (36.4 C) (Oral)  Resp 22  SpO2 99% Physical Exam  Constitutional: He is oriented to person, place, and time. He appears well-developed and well-nourished. No distress.  HENT:  Head: Normocephalic and atraumatic.  Mouth/Throat: Oropharynx is clear and moist. No oropharyngeal exudate.  Eyes: Conjunctivae and EOM are normal. Pupils are equal, round, and reactive to light.  Neck: Normal range of motion. Neck supple.  Cardiovascular: Normal rate, regular rhythm and normal heart sounds.   No murmur heard. Pulmonary/Chest: Effort normal and breath sounds normal. No respiratory distress.  Abdominal: Soft. There is no tenderness. There is no rebound and no guarding.  Musculoskeletal: He exhibits no edema and no tenderness.  Neurological: He is alert and oriented to person, place, and time. A cranial nerve deficit is present.  Slurred speech, right-sided facial droop, right arm is flaccid and unable to raise off bed. Weakness raising right leg off bed. No ataxia on finger to  nose on the left. Unable to test with the right.  Skin: Skin is warm.    ED Course  Procedures (including critical care time) Labs Review Labs Reviewed  CBC - Abnormal; Notable for the following:    Hemoglobin 17.3 (*)    All other components within normal limits  COMPREHENSIVE METABOLIC PANEL - Abnormal; Notable for the following:    GFR calc non Af Amer 74 (*)    GFR calc Af Amer 85 (*)    All other components within normal limits  ETHANOL  PROTIME-INR  APTT  DIFFERENTIAL  TROPONIN I  GLUCOSE,  CAPILLARY  TROPONIN I  URINE RAPID DRUG SCREEN (HOSP PERFORMED)  URINALYSIS, ROUTINE W REFLEX MICROSCOPIC  TROPONIN I  TROPONIN I  HEMOGLOBIN A1C  LIPID PANEL  POCT I-STAT, CHEM 8  POCT I-STAT TROPONIN I   Imaging Review Ct Head Wo Contrast  02/01/2013   CLINICAL DATA:  Right-sided weakness  EXAM: CT HEAD WITHOUT CONTRAST  TECHNIQUE: Contiguous axial images were obtained from the base of the skull through the vertex without intravenous contrast.  COMPARISON:  MRI of the brain dated September 20, 2008.  FINDINGS: The ventricles are normal in size and position. There is no intracranial hemorrhage nor intracranial mass effect. There is no evidence of an evolving ischemic infarction. The cerebellum and brainstem are normal in density. There are no abnormal intracranial calcifications.  At bone window settings the observed portions of the paranasal sinuses and mastoid air cells are clear. There is no evidence of an acute skull fracture.  IMPRESSION: 1. There is no evidence of an acute ischemic or hemorrhagic infarction. 2. There is no intracranial mass effect nor hydrocephalus. 3. If there is strong clinical concerns of acute ischemic processes, MRI would be useful if the patient can tolerate the procedure.   Electronically Signed   By: David  [place]   On: 02/01/2013 08:59    EKG Interpretation    Date/Time:  Tuesday February 01 2013 08:10:53 EST Ventricular Rate:  69 PR Interval:  130 QRS Duration: 114 QT Interval:  410 QTC Calculation: 439 R Axis:   91 Text Interpretation:  Sinus rhythm Borderline intraventricular conduction delay Repol abnrm suggests ischemia, diffuse leads Minimal ST elevation, lateral leads No previous ECGs available Confirmed by Tinesha Siegrist  MD, Maneh Sieben ([id number]) on 02/01/2013 8:18:14 AM            MDM   1. CVA (cerebral infarction)   2. Gout   3. HTN (hypertension)    Concern for CVA. Last seen normal yesterday morning. Patient is not a TPA candidate secondary to  delayed presentation.  Patient denies any chest pain or shortness of breath. EKG shows nonspecific ST changes. CT is negative for hemorrhage. Will allow permissive hypertension. Order MRI. Clinically patient appears to have left MCA infarct. Admission discussed with Dr. Thedore Mins. Again patient is not a TPA candidate secondary to his late presentation last seen normal 24 hours ago. Dr. Leroy Kennedy neurology will consult.   Glynn Octave, MD 02/01/13 1233

## 2013-02-01 NOTE — ED Notes (Signed)
Pt states that yesterdaty he was normal and thenhe  Could move rt leg very well it got better and then he woke up this am w/ rt side not working well at all has rt sided facial droop slurred speech and weak grip on rt side marked difference in strength

## 2013-02-01 NOTE — Progress Notes (Signed)
  Echocardiogram 2D Echocardiogram has been performed.  Bruce Guerrero 02/01/2013, 1:15 PM

## 2013-02-01 NOTE — ED Notes (Signed)
Pt to MRI and then they called to say pt was not able to tolerate , I  called  Neuro to tell them,

## 2013-02-01 NOTE — Consult Note (Signed)
Referring Physician: Rancour    Chief Complaint: Stroke  HPI:                                                                                                                                         Bruce Guerrero is an 52 y.o. male who was normal yesterday.. While at work he noted he had right leg weakness which hen progressed to right arm weakness by the end of the day.  He did not seek medical attention at that time.  This AM he noted right facial droop along with previous symptoms. On consultation his right arm is flaccid (Per patient he was able to move his right arm on arrival to ED) with right leg paresis and right facial droop. HE states he does take his medication daily including his ASA.   Date last known well: Date: 01/31/2013 Time last known well: Time: 10:00 tPA Given: No: out of window  Past Medical History  Diagnosis Date  . Gout   . Hypertension     History reviewed. No pertinent past surgical history.  Family History  Problem Relation Age of Onset  . COPD Mother   . COPD Father   . Cancer Sister   . Hyperlipidemia Brother   . Diabetes Brother    Social History:  reports that he has never smoked. He does not have any smokeless tobacco history on file. He reports that he drinks alcohol. He reports that he does not use illicit drugs.  Allergies: No Known Allergies  Medications:                                                                                                                           Current Facility-Administered Medications  Medication Dose Route Frequency Provider Last Rate Last Dose  . 0.9 %  sodium chloride infusion   Intravenous STAT Glynn Octave, MD       Current Outpatient Prescriptions  Medication Sig Dispense Refill  . amLODipine (NORVASC) 10 MG tablet Take 10 mg by mouth daily.      Marland Kitchen aspirin 325 MG tablet Take 325 mg by mouth daily as needed for mild pain.      Marland Kitchen colchicine 0.6 MG tablet Take 0.6 mg by mouth daily.         ROS:  History obtained from the patient  General ROS: negative for - chills, fatigue, fever, night sweats, weight gain or weight loss Psychological ROS: negative for - behavioral disorder, hallucinations, memory difficulties, mood swings or suicidal ideation Ophthalmic ROS: negative for - blurry vision, double vision, eye pain or loss of vision ENT ROS: negative for - epistaxis, nasal discharge, oral lesions, sore throat, tinnitus or vertigo Allergy and Immunology ROS: negative for - hives or itchy/watery eyes Hematological and Lymphatic ROS: negative for - bleeding problems, bruising or swollen lymph nodes Endocrine ROS: negative for - galactorrhea, hair pattern changes, polydipsia/polyuria or temperature intolerance Respiratory ROS: negative for - cough, hemoptysis, shortness of breath or wheezing Cardiovascular ROS: negative for - chest pain, dyspnea on exertion, edema or irregular heartbeat Gastrointestinal ROS: negative for - abdominal pain, diarrhea, hematemesis, nausea/vomiting or stool incontinence Genito-Urinary ROS: negative for - dysuria, hematuria, incontinence or urinary frequency/urgency Musculoskeletal ROS: negative for - joint swelling or muscular weakness Neurological ROS: as noted in HPI Dermatological ROS: negative for rash and skin lesion changes  Neurologic Examination:                                                                                                      Blood pressure 179/94, pulse 94, temperature 97.6 F (36.4 C), temperature source Oral, resp. rate 20, SpO2 96.00%.   Mental Status: Alert, oriented, thought content appropriate.  Speech dysarthric without evidence of aphasia.  Able to follow 3 step commands without difficulty. Cranial Nerves: II: Discs flat bilaterally; Visual fields grossly normal, pupils equal, round, reactive  to light and accommodation III,IV, VI: ptosis not present, extra-ocular motions intact bilaterally V,VII: smile asymmetric on the right, facial light touch sensation normal bilaterally VIII: hearing normal bilaterally IX,X: gag reflex present XI: bilateral shoulder shrug XII: midline tongue extension without atrophy or fasciculations  Motor: Right : Upper extremity   0/5    Left:     Upper extremity   5/5  Lower extremity   1/5     Lower extremity   5/5 Tone and bulk:normal tone throughout; no atrophy noted Sensory: Pinprick and light touch intact throughout, bilaterally Deep Tendon Reflexes:  Right: Upper Extremity   Left: Upper extremity   biceps (C-5 to C-6) 3/4   biceps (C-5 to C-6) 2/4 tricep (C7) 2/4    triceps (C7) 2/4 Brachioradialis (C6) 3/4  Brachioradialis (C6) 2/4  Lower Extremity Lower Extremity  quadriceps (L-2 to L-4) 2/4   quadriceps (L-2 to L-4) 2/4 Achilles (S1) 2/4   Achilles (S1) 2/4  Plantars: Right: upgoing   Left: downgoing Cerebellar: normal finger-to-nose,  normal heel-to-shin test on the left Gait: not tested CV: pulses palpable throughout    Results for orders placed during the hospital encounter of 02/01/13 (from the past 48 hour(s))  ETHANOL     Status: None   Collection Time    02/01/13  8:36 AM      Result Value Range   Alcohol, Ethyl (B) <11  0 - 11 mg/dL   Comment:  LOWEST DETECTABLE LIMIT FOR     SERUM ALCOHOL IS 11 mg/dL     FOR MEDICAL PURPOSES ONLY  PROTIME-INR     Status: None   Collection Time    02/01/13  8:36 AM      Result Value Range   Prothrombin Time 12.2  11.6 - 15.2 seconds   INR 0.92  0.00 - 1.49  APTT     Status: None   Collection Time    02/01/13  8:36 AM      Result Value Range   aPTT 32  24 - 37 seconds  CBC     Status: Abnormal   Collection Time    02/01/13  8:36 AM      Result Value Range   WBC 8.8  4.0 - 10.5 K/uL   RBC 5.29  4.22 - 5.81 MIL/uL   Hemoglobin 17.3 (*) 13.0 - 17.0 g/dL   HCT 16.1   09.6 - 04.5 %   MCV 91.7  78.0 - 100.0 fL   MCH 32.7  26.0 - 34.0 pg   MCHC 35.7  30.0 - 36.0 g/dL   RDW 40.9  81.1 - 91.4 %   Platelets 187  150 - 400 K/uL  DIFFERENTIAL     Status: None   Collection Time    02/01/13  8:36 AM      Result Value Range   Neutrophils Relative % 67  43 - 77 %   Neutro Abs 5.9  1.7 - 7.7 K/uL   Lymphocytes Relative 26  12 - 46 %   Lymphs Abs 2.2  0.7 - 4.0 K/uL   Monocytes Relative 6  3 - 12 %   Monocytes Absolute 0.5  0.1 - 1.0 K/uL   Eosinophils Relative 1  0 - 5 %   Eosinophils Absolute 0.1  0.0 - 0.7 K/uL   Basophils Relative 0  0 - 1 %   Basophils Absolute 0.0  0.0 - 0.1 K/uL  COMPREHENSIVE METABOLIC PANEL     Status: Abnormal   Collection Time    02/01/13  8:36 AM      Result Value Range   Sodium 139  135 - 145 mEq/L   Potassium 3.7  3.5 - 5.1 mEq/L   Chloride 100  96 - 112 mEq/L   CO2 25  19 - 32 mEq/L   Glucose, Bld 96  70 - 99 mg/dL   BUN 15  6 - 23 mg/dL   Creatinine, Ser 7.82  0.50 - 1.35 mg/dL   Calcium 9.8  8.4 - 95.6 mg/dL   Total Protein 7.5  6.0 - 8.3 g/dL   Albumin 4.4  3.5 - 5.2 g/dL   AST 17  0 - 37 U/L   ALT 20  0 - 53 U/L   Alkaline Phosphatase 88  39 - 117 U/L   Total Bilirubin 0.5  0.3 - 1.2 mg/dL   GFR calc non Af Amer 74 (*) >90 mL/min   GFR calc Af Amer 85 (*) >90 mL/min   Comment: (NOTE)     The eGFR has been calculated using the CKD EPI equation.     This calculation has not been validated in all clinical situations.     eGFR's persistently <90 mL/min signify possible Chronic Kidney     Disease.  TROPONIN I     Status: None   Collection Time    02/01/13  8:36 AM      Result Value Range   Troponin  I <0.30  <0.30 ng/mL   Comment:            Due to the release kinetics of cTnI,     a negative result within the first hours     of the onset of symptoms does not rule out     myocardial infarction with certainty.     If myocardial infarction is still suspected,     repeat the test at appropriate intervals.   GLUCOSE, CAPILLARY     Status: None   Collection Time    02/01/13  8:59 AM      Result Value Range   Glucose-Capillary 97  70 - 99 mg/dL  POCT I-STAT TROPONIN I     Status: None   Collection Time    02/01/13  9:10 AM      Result Value Range   Troponin i, poc 0.01  0.00 - 0.08 ng/mL   Comment 3            Comment: Due to the release kinetics of cTnI,     a negative result within the first hours     of the onset of symptoms does not rule out     myocardial infarction with certainty.     If myocardial infarction is still suspected,     repeat the test at appropriate intervals.  POCT I-STAT, CHEM 8     Status: None   Collection Time    02/01/13  9:11 AM      Result Value Range   Sodium 141  135 - 145 mEq/L   Potassium 3.8  3.5 - 5.1 mEq/L   Chloride 102  96 - 112 mEq/L   BUN 14  6 - 23 mg/dL   Creatinine, Ser 1.61  0.50 - 1.35 mg/dL   Glucose, Bld 98  70 - 99 mg/dL   Calcium, Ion 0.96  0.45 - 1.23 mmol/L   TCO2 27  0 - 100 mmol/L   Hemoglobin 17.0  13.0 - 17.0 g/dL   HCT 40.9  81.1 - 91.4 %   Ct Head Wo Contrast  02/01/2013   CLINICAL DATA:  Right-sided weakness  EXAM: CT HEAD WITHOUT CONTRAST  TECHNIQUE: Contiguous axial images were obtained from the base of the skull through the vertex without intravenous contrast.  COMPARISON:  MRI of the brain dated September 20, 2008.  FINDINGS: The ventricles are normal in size and position. There is no intracranial hemorrhage nor intracranial mass effect. There is no evidence of an evolving ischemic infarction. The cerebellum and brainstem are normal in density. There are no abnormal intracranial calcifications.  At bone window settings the observed portions of the paranasal sinuses and mastoid air cells are clear. There is no evidence of an acute skull fracture.  IMPRESSION: 1. There is no evidence of an acute ischemic or hemorrhagic infarction. 2. There is no intracranial mass effect nor hydrocephalus. 3. If there is strong clinical concerns of  acute ischemic processes, MRI would be useful if the patient can tolerate the procedure.   Electronically Signed   By: David  [place]   On: 02/01/2013 08:59    Assessment and plan discussed with with attending physician and they are in agreement.    Felicie Morn PA-C [facility] [phone]  02/01/2013, 10:16 AM   Assessment: 52 y.o. male with new onset right face, arm and leg weakness which started yesterday at 10 AM (24 hours ago).  Likely right MCA distribution or [place] infarct.  Recommend: 1. HgbA1c, fasting lipid panel 2. MRI, MRA  of the brain without contrast and MRA neck with contrast 3. PT consult, OT consult, Speech consult 4. Echocardiogram 7. Prophylactic therapy-Antiplatelet med: Plavix - dose 75 mg daily 8. Risk  Factor modification    Stroke Risk Factors - hypertension  Patient seen and examined together with physician assistant and I concur with the assessment and plan.  Wyatt Portela, MD

## 2013-02-02 LAB — LIPID PANEL
Cholesterol: 207 mg/dL — ABNORMAL HIGH (ref 0–200)
HDL: 29 mg/dL — ABNORMAL LOW (ref >39)
LDL Cholesterol: 117 mg/dL — ABNORMAL HIGH (ref 0–99)
LDL Cholesterol: 141 mg/dL — ABNORMAL HIGH (ref 0–99)
Total CHOL/HDL Ratio: 6.1 RATIO
Triglycerides: 151 mg/dL — ABNORMAL HIGH (ref <150)
Triglycerides: 180 mg/dL — ABNORMAL HIGH (ref <150)

## 2013-02-02 LAB — CBC
HCT: 48.4 % (ref 39.0–52.0)
Hemoglobin: 17.3 g/dL — ABNORMAL HIGH (ref 13.0–17.0)
MCHC: 35.7 g/dL (ref 30.0–36.0)
MCV: 94.2 fL (ref 78.0–100.0)
RBC: 5.14 MIL/uL (ref 4.22–5.81)
RDW: 13.6 % (ref 11.5–15.5)
WBC: 11.5 10*3/uL — ABNORMAL HIGH (ref 4.0–10.5)

## 2013-02-02 LAB — BASIC METABOLIC PANEL
BUN: 14 mg/dL (ref 6–23)
CO2: 24 mEq/L (ref 19–32)
Chloride: 98 mEq/L (ref 96–112)
Creatinine, Ser: 1.01 mg/dL (ref 0.50–1.35)
GFR calc non Af Amer: 84 mL/min — ABNORMAL LOW (ref >90)
Glucose, Bld: 99 mg/dL (ref 70–99)
Potassium: 3.4 mEq/L — ABNORMAL LOW (ref 3.5–5.1)

## 2013-02-02 LAB — HEMOGLOBIN A1C: Hgb A1c MFr Bld: 5.6 % (ref <5.7)

## 2013-02-02 MED ORDER — ATORVASTATIN CALCIUM 20 MG PO TABS
20.0000 mg | ORAL_TABLET | Freq: Every day | ORAL | Status: DC
  Filled 2013-02-02: qty 1

## 2013-02-02 MED ORDER — LORAZEPAM 0.5 MG PO TABS
0.5000 mg | ORAL_TABLET | Freq: Four times a day (QID) | ORAL | Status: DC | PRN
  Administered 2013-02-02 – 2013-02-07 (×8): 0.5 mg via ORAL
  Filled 2013-02-02 (×8): qty 1

## 2013-02-02 MED ORDER — ATORVASTATIN CALCIUM 80 MG PO TABS
80.0000 mg | ORAL_TABLET | Freq: Every day | ORAL | Status: DC
  Administered 2013-02-03 – 2013-02-07 (×5): 80 mg via ORAL
  Filled 2013-02-02 (×7): qty 1

## 2013-02-02 MED ORDER — CLOPIDOGREL BISULFATE 75 MG PO TABS
75.0000 mg | ORAL_TABLET | Freq: Every day | ORAL | Status: DC
  Administered 2013-02-03 – 2013-02-08 (×6): 75 mg via ORAL
  Filled 2013-02-02 (×7): qty 1

## 2013-02-02 MED ORDER — AMLODIPINE BESYLATE 10 MG PO TABS
10.0000 mg | ORAL_TABLET | Freq: Every day | ORAL | Status: DC
  Administered 2013-02-02 – 2013-02-08 (×7): 10 mg via ORAL
  Filled 2013-02-02 (×7): qty 1

## 2013-02-02 NOTE — Clinical Social Work Psychosocial (Signed)
Clinical Social Work Department BRIEF PSYCHOSOCIAL ASSESSMENT 02/02/2013  Patient:  Bruce Guerrero, Bruce Guerrero     Account Number:  [id number]     Admit date:  02/01/2013  Clinical Social Worker:  Sherre Lain  Date/Time:  02/02/2013 02:50 PM  Referred by:  Physician  Date Referred:  02/02/2013 Referred for  SNF Placement   Other Referral:   none.   Interview type:  Patient Other interview type:   Pt's son, Bruce Guerrero, was also present at bedside.    PSYCHOSOCIAL DATA Living Status:  FAMILY Admitted from facility:   Level of care:   Primary support name:  Bruce Guerrero Primary support relationship to patient:  CHILD, ADULT Degree of support available:   Pt's son is supportive at bedside.    CURRENT CONCERNS Current Concerns  Post-Acute Placement  Financial Resources   Other Concerns:   none.    SOCIAL WORK ASSESSMENT / PLAN CSW met with pt and pt's son, Bruce Guerrero, at bedside. CSW explained role at [facility]. Pt and pt's son were agreeable to speaking with CSW. Pt stated that he was not open to the SNF placement plan, but would allow CSW to fax out clinical information to [facility], [place]. CSW to present bed offers to pt and continue to follow for discharge planning needs.   Assessment/plan status:  Psychosocial Support/Ongoing Assessment of Needs Other assessment/ plan:   none.   Information/referral to community resources:   [facility] placement.  Financial counseling services.    PATIENTS/FAMILYS RESPONSE TO PLAN OF CARE: Pt and pt's family were understanding and agreeable to CSW plan of care.       [name], LCSWA Clinical Social Worker [phone]

## 2013-02-02 NOTE — Consult Note (Signed)
Physical Medicine and Rehabilitation Consult Reason for Consult: CVA Referring Physician: [facility]   HPI: Bruce Guerrero is a 52 y.o. right-handed male with history of hypertension. Patient was independent prior to admission working full time. Admitted 02/01/2013 with right-sided weakness and slurred speech. MRI of the brain showed a moderately enlarged deep white matter infarction on the left. MRA of the head with no large vessel occlusion identified. Carotid Dopplers with no ICA stenosis. Echocardiogram pending. Patient did not receive TPA. Neurology services consulted with workup ongoing presently maintained on aspirin for CVA prophylaxis with the addition of subcutaneous heparin for DVT prophylaxis. He is tolerating a regular diet. Physical therapy evaluation  completed 02/02/2013 with recommendations for physical medicine rehabilitation consult to consider inpatient rehabilitation services.  Patient sitting in chair with his brother. Brother asking questions about stroke prognosis. Patient starts nodding off to sleep  Patient was reportedly working full time prior to his stroke Left knee pain, patient thinks it's gout related  Review of Systems  Musculoskeletal: Positive for joint pain and myalgias.  All other systems reviewed and are negative.   Past Medical History  Diagnosis Date  . Gout   . Hypertension    History reviewed. No pertinent past surgical history. Family History  Problem Relation Age of Onset  . COPD Mother   . COPD Father   . Cancer Sister   . Hyperlipidemia Brother   . Diabetes Brother    Social History:  reports that he has never smoked. He does not have any smokeless tobacco history on file. He reports that he drinks alcohol. He reports that he does not use illicit drugs. Allergies: No Known Allergies Medications Prior to Admission  Medication Sig Dispense Refill  . amLODipine (NORVASC) 10 MG tablet Take 10 mg by mouth daily.      Marland Kitchen aspirin 325 MG tablet  Take 325 mg by mouth daily as needed for mild pain.      Marland Kitchen colchicine 0.6 MG tablet Take 0.6 mg by mouth daily.        Home: Home Living Family/patient expects to be discharged to:: Private residence Living Arrangements: Children (2 sons) Available Help at Discharge: Family Type of Home: House Home Access: Stairs to enter [employer] of Steps: 2 Home Layout: Two level;Able to live on main level with bedroom/bathroom Alternate Level Stairs-Number of Steps: 8 Alternate Level Stairs-Rails: Can reach both Home Equipment: None  Functional History:   Functional Status:  Mobility: Bed Mobility Bed Mobility: Not assessed Transfers Transfers: Sit to Stand;Stand to Sit Sit to Stand: 2: Max assist Stand to Sit: 2: Max assist Ambulation/Gait Ambulation/Gait Assistance: 1: +2 Total assist Ambulation/Gait: Patient Percentage: 30% Ambulation Distance (Feet): 5 Feet Assistive device: 2 person hand held assist Ambulation/Gait Assistance Details: Block R leg, support trunk during weight shift.  Assist step on R side with hand or leg to advance. Gait Pattern: Step-to pattern Stairs: No    ADL:    Cognition: Cognition Overall Cognitive Status: Within Functional Limits for tasks assessed Orientation Level: Oriented to person;Oriented to situation Cognition Arousal/Alertness: Lethargic Behavior During Therapy: WFL for tasks assessed/performed Overall Cognitive Status: Within Functional Limits for tasks assessed  Blood pressure 152/82, pulse 72, temperature 97.7 F (36.5 C), temperature source Oral, resp. rate 18, height 5\' 4"  (1.626 m), weight 87.045 kg (191 lb 14.4 oz), SpO2 97.00%. Physical Exam  Constitutional: He appears well-developed.  Eyes: EOM are normal.  Neck: Normal range of motion. Neck supple. No thyromegaly present.  Cardiovascular:  Normal rate and regular rhythm.   Respiratory: Effort normal and breath sounds normal. No respiratory distress.  GI: Soft.  Bowel sounds are normal. He exhibits no distension.  Neurological:  Anxious.Speech is dysarthric but intelligible.Follows simple commands.Oriented x 3.Fair overall insight  Skin: Skin is warm and dry.   motor strength is 0/5 in the right deltoid, bicep, tricep, grip, hip flexor, knee extensors, ankle dorsi flexion plantar flexor. 5/5 on the left side in the upper and lower limb Sensation diminished to light touch in the right upper and right lower limb. Visual fields are intact to confrontation testing. Sitting balance is fair Right cranial nerve 7 central   Results for orders placed during the hospital encounter of 02/01/13 (from the past 24 hour(s))  GLUCOSE, CAPILLARY     Status: None   Collection Time    02/01/13  8:59 AM      Result Value Range   Glucose-Capillary 97  70 - 99 mg/dL  POCT I-STAT TROPONIN I     Status: None   Collection Time    02/01/13  9:10 AM      Result Value Range   Troponin i, poc 0.01  0.00 - 0.08 ng/mL   Comment 3           POCT I-STAT, CHEM 8     Status: None   Collection Time    02/01/13  9:11 AM      Result Value Range   Sodium 141  135 - 145 mEq/L   Potassium 3.8  3.5 - 5.1 mEq/L   Chloride 102  96 - 112 mEq/L   BUN 14  6 - 23 mg/dL   Creatinine, Ser 4.09  0.50 - 1.35 mg/dL   Glucose, Bld 98  70 - 99 mg/dL   Calcium, Ion 8.11  9.14 - 1.23 mmol/L   TCO2 27  0 - 100 mmol/L   Hemoglobin 17.0  13.0 - 17.0 g/dL   HCT 78.2  95.6 - 21.3 %  URINE RAPID DRUG SCREEN (HOSP PERFORMED)     Status: None   Collection Time    02/01/13 11:40 AM      Result Value Range   Opiates NONE DETECTED  NONE DETECTED   Cocaine NONE DETECTED  NONE DETECTED   Benzodiazepines NONE DETECTED  NONE DETECTED   Amphetamines NONE DETECTED  NONE DETECTED   Tetrahydrocannabinol NONE DETECTED  NONE DETECTED   Barbiturates NONE DETECTED  NONE DETECTED  URINALYSIS, ROUTINE W REFLEX MICROSCOPIC     Status: Abnormal   Collection Time    02/01/13 11:40 AM      Result Value Range    Color, Urine YELLOW  YELLOW   APPearance CLEAR  CLEAR   Specific Gravity, Urine 1.013  1.005 - 1.030   pH 6.0  5.0 - 8.0   Glucose, UA NEGATIVE  NEGATIVE mg/dL   Hgb urine dipstick MODERATE (*) NEGATIVE   Bilirubin Urine NEGATIVE  NEGATIVE   Ketones, ur NEGATIVE  NEGATIVE mg/dL   Protein, ur NEGATIVE  NEGATIVE mg/dL   Urobilinogen, UA 0.2  0.0 - 1.0 mg/dL   Nitrite NEGATIVE  NEGATIVE   Leukocytes, UA NEGATIVE  NEGATIVE  URINE MICROSCOPIC-ADD ON     Status: None   Collection Time    02/01/13 11:40 AM      Result Value Range   RBC / HPF 3-6  <3 RBC/hpf  TROPONIN I     Status: None   Collection Time    02/01/13  11:45 AM      Result Value Range   Troponin I <0.30  <0.30 ng/mL  HEMOGLOBIN A1C     Status: None   Collection Time    02/01/13 11:45 AM      Result Value Range   Hemoglobin A1C 5.5  <5.7 %   Mean Plasma Glucose 111  <117 mg/dL  TROPONIN I     Status: None   Collection Time    02/01/13  4:29 PM      Result Value Range   Troponin I <0.30  <0.30 ng/mL  TROPONIN I     Status: None   Collection Time    02/01/13 10:55 PM      Result Value Range   Troponin I <0.30  <0.30 ng/mL  LIPID PANEL     Status: Abnormal   Collection Time    02/02/13  1:10 AM      Result Value Range   Cholesterol 176  0 - 200 mg/dL   Triglycerides 161 (*) <150 mg/dL   HDL 29 (*) >09 mg/dL   Total CHOL/HDL Ratio 6.1     VLDL 30  0 - 40 mg/dL   LDL Cholesterol 604 (*) 0 - 99 mg/dL  LIPID PANEL     Status: Abnormal   Collection Time    02/02/13  3:56 AM      Result Value Range   Cholesterol 207 (*) 0 - 200 mg/dL   Triglycerides 540 (*) <150 mg/dL   HDL 30 (*) >98 mg/dL   Total CHOL/HDL Ratio 6.9     VLDL 36  0 - 40 mg/dL   LDL Cholesterol 119 (*) 0 - 99 mg/dL  BASIC METABOLIC PANEL     Status: Abnormal   Collection Time    02/02/13  3:56 AM      Result Value Range   Sodium 136  135 - 145 mEq/L   Potassium 3.4 (*) 3.5 - 5.1 mEq/L   Chloride 98  96 - 112 mEq/L   CO2 24  19 - 32 mEq/L    Glucose, Bld 99  70 - 99 mg/dL   BUN 14  6 - 23 mg/dL   Creatinine, Ser 1.47  0.50 - 1.35 mg/dL   Calcium 9.7  8.4 - 82.9 mg/dL   GFR calc non Af Amer 84 (*) >90 mL/min   GFR calc Af Amer >90  >90 mL/min  CBC     Status: Abnormal   Collection Time    02/02/13  3:56 AM      Result Value Range   WBC 11.5 (*) 4.0 - 10.5 K/uL   RBC 5.14  4.22 - 5.81 MIL/uL   Hemoglobin 17.3 (*) 13.0 - 17.0 g/dL   HCT 56.2  13.0 - 86.5 %   MCV 94.2  78.0 - 100.0 fL   MCH 33.7  26.0 - 34.0 pg   MCHC 35.7  30.0 - 36.0 g/dL   RDW 78.4  69.6 - 29.5 %   Platelets 178  150 - 400 K/uL   Ct Head Wo Contrast  02/01/2013   CLINICAL DATA:  Right-sided weakness  EXAM: CT HEAD WITHOUT CONTRAST  TECHNIQUE: Contiguous axial images were obtained from the base of the skull through the vertex without intravenous contrast.  COMPARISON:  MRI of the brain dated September 20, 2008.  FINDINGS: The ventricles are normal in size and position. There is no intracranial hemorrhage nor intracranial mass effect. There is no evidence of an evolving ischemic  infarction. The cerebellum and brainstem are normal in density. There are no abnormal intracranial calcifications.  At bone window settings the observed portions of the paranasal sinuses and mastoid air cells are clear. There is no evidence of an acute skull fracture.  IMPRESSION: 1. There is no evidence of an acute ischemic or hemorrhagic infarction. 2. There is no intracranial mass effect nor hydrocephalus. 3. If there is strong clinical concerns of acute ischemic processes, MRI would be useful if the patient can tolerate the procedure.   Electronically Signed   By: David  [place]   On: 02/01/2013 08:59   Mr Maxine Glenn Head Wo Contrast  02/01/2013   CLINICAL DATA:  Stroke.  Right-sided weakness.  EXAM: MRI HEAD WITHOUT AND WITH CONTRAST  MRA HEAD WITHOUT CONTRAST  MRA NECK WITHOUT AND WITH CONTRAST  TECHNIQUE: Multiplanar, multiecho pulse sequences of the brain and surrounding structures were  obtained without and with intravenous contrast. Angiographic images of the Circle of Willis were obtained using MRA technique without intravenous contrast. Angiographic images of the neck were obtained using MRA technique without and with intravenous contrast. Carotid stenosis measurements (when applicable) are obtained utilizing NASCET criteria, using the distal internal carotid diameter as the denominator.  CONTRAST:  18mL MULTIHANCE GADOBENATE DIMEGLUMINE 529 MG/ML IV SOLN  COMPARISON:  CT head 02/01/2013  FINDINGS: MRI HEAD FINDINGS  Image quality degraded by motion. The patient was claustrophobic. Postcontrast images are especially degraded by motion.  Acute infarct in the deep white matter on the left adjacent to the ventricle. This measures 15 x 23 mm and is a moderately large white matter infarct. No other acute infarct.  Ventricle size is normal. No midline shift. Mild chronic microvascular ischemic change in the white matter. No hemorrhage or mass lesion. No enhancing lesions are seen postcontrast.  MRA HEAD FINDINGS  Image quality degraded by significant motion. This is a limited examination.  Distal left vertebral artery is patent to the basilar. Distal right vertebral artery does not contribute to the basilar and may end in AICA. The basilar is patent. Posterior cerebral arteries are patent bilaterally. Cerebellar arteries are not adequately evaluated due to motion.  The internal carotid artery is patent bilaterally. Anterior and middle cerebral arteries are patent bilaterally. I cannot assess for intracranial stenosis given the amount of artifact from motion.  MRA NECK FINDINGS  Right carotid artery is widely patent. Right carotid bifurcation is normal.  Left carotid artery is widely patent without significant stenosis.  There is loss of flow related signal in the proximal vertebral artery bilaterally which may be due to severe stenosis versus tortuosity. Left vertebral artery is dominant and patent to  the basilar. Right vertebral artery is smaller and ends in PICA.  IMPRESSION: Moderately large deep white matter infarct on the left.  MRA is significantly degraded by motion. No large vessel occlusion is identified.  No significant carotid artery stenosis  Severe stenosis versus and tortuosity at the origin of the vertebral artery bilaterally. Left vertebral artery is dominant.   Electronically Signed   By: Marlan Palau M.D.   On: 02/01/2013 20:10   Mr Angiogram Neck W Wo Contrast  02/01/2013   CLINICAL DATA:  Stroke.  Right-sided weakness.  EXAM: MRI HEAD WITHOUT AND WITH CONTRAST  MRA HEAD WITHOUT CONTRAST  MRA NECK WITHOUT AND WITH CONTRAST  TECHNIQUE: Multiplanar, multiecho pulse sequences of the brain and surrounding structures were obtained without and with intravenous contrast. Angiographic images of the Circle of Willis were obtained using  MRA technique without intravenous contrast. Angiographic images of the neck were obtained using MRA technique without and with intravenous contrast. Carotid stenosis measurements (when applicable) are obtained utilizing NASCET criteria, using the distal internal carotid diameter as the denominator.  CONTRAST:  18mL MULTIHANCE GADOBENATE DIMEGLUMINE 529 MG/ML IV SOLN  COMPARISON:  CT head 02/01/2013  FINDINGS: MRI HEAD FINDINGS  Image quality degraded by motion. The patient was claustrophobic. Postcontrast images are especially degraded by motion.  Acute infarct in the deep white matter on the left adjacent to the ventricle. This measures 15 x 23 mm and is a moderately large white matter infarct. No other acute infarct.  Ventricle size is normal. No midline shift. Mild chronic microvascular ischemic change in the white matter. No hemorrhage or mass lesion. No enhancing lesions are seen postcontrast.  MRA HEAD FINDINGS  Image quality degraded by significant motion. This is a limited examination.  Distal left vertebral artery is patent to the basilar. Distal right  vertebral artery does not contribute to the basilar and may end in AICA. The basilar is patent. Posterior cerebral arteries are patent bilaterally. Cerebellar arteries are not adequately evaluated due to motion.  The internal carotid artery is patent bilaterally. Anterior and middle cerebral arteries are patent bilaterally. I cannot assess for intracranial stenosis given the amount of artifact from motion.  MRA NECK FINDINGS  Right carotid artery is widely patent. Right carotid bifurcation is normal.  Left carotid artery is widely patent without significant stenosis.  There is loss of flow related signal in the proximal vertebral artery bilaterally which may be due to severe stenosis versus tortuosity. Left vertebral artery is dominant and patent to the basilar. Right vertebral artery is smaller and ends in PICA.  IMPRESSION: Moderately large deep white matter infarct on the left.  MRA is significantly degraded by motion. No large vessel occlusion is identified.  No significant carotid artery stenosis  Severe stenosis versus and tortuosity at the origin of the vertebral artery bilaterally. Left vertebral artery is dominant.   Electronically Signed   By: Marlan Palau M.D.   On: 02/01/2013 20:10   Mr Bruce Guerrero Contrast  02/01/2013   CLINICAL DATA:  Stroke.  Right-sided weakness.  EXAM: MRI HEAD WITHOUT AND WITH CONTRAST  MRA HEAD WITHOUT CONTRAST  MRA NECK WITHOUT AND WITH CONTRAST  TECHNIQUE: Multiplanar, multiecho pulse sequences of the brain and surrounding structures were obtained without and with intravenous contrast. Angiographic images of the Circle of Willis were obtained using MRA technique without intravenous contrast. Angiographic images of the neck were obtained using MRA technique without and with intravenous contrast. Carotid stenosis measurements (when applicable) are obtained utilizing NASCET criteria, using the distal internal carotid diameter as the denominator.  CONTRAST:  18mL MULTIHANCE  GADOBENATE DIMEGLUMINE 529 MG/ML IV SOLN  COMPARISON:  CT head 02/01/2013  FINDINGS: MRI HEAD FINDINGS  Image quality degraded by motion. The patient was claustrophobic. Postcontrast images are especially degraded by motion.  Acute infarct in the deep white matter on the left adjacent to the ventricle. This measures 15 x 23 mm and is a moderately large white matter infarct. No other acute infarct.  Ventricle size is normal. No midline shift. Mild chronic microvascular ischemic change in the white matter. No hemorrhage or mass lesion. No enhancing lesions are seen postcontrast.  MRA HEAD FINDINGS  Image quality degraded by significant motion. This is a limited examination.  Distal left vertebral artery is patent to the basilar. Distal right vertebral artery does  not contribute to the basilar and may end in AICA. The basilar is patent. Posterior cerebral arteries are patent bilaterally. Cerebellar arteries are not adequately evaluated due to motion.  The internal carotid artery is patent bilaterally. Anterior and middle cerebral arteries are patent bilaterally. I cannot assess for intracranial stenosis given the amount of artifact from motion.  MRA NECK FINDINGS  Right carotid artery is widely patent. Right carotid bifurcation is normal.  Left carotid artery is widely patent without significant stenosis.  There is loss of flow related signal in the proximal vertebral artery bilaterally which may be due to severe stenosis versus tortuosity. Left vertebral artery is dominant and patent to the basilar. Right vertebral artery is smaller and ends in PICA.  IMPRESSION: Moderately large deep white matter infarct on the left.  MRA is significantly degraded by motion. No large vessel occlusion is identified.  No significant carotid artery stenosis  Severe stenosis versus and tortuosity at the origin of the vertebral artery bilaterally. Left vertebral artery is dominant.   Electronically Signed   By: Marlan Palau M.D.   On:  02/01/2013 20:10    Assessment/Plan: Diagnosis: Left subcortical white matter infarct with right flaccid hemiplegia and right him he sensory deficits 1. Does the need for close, 24 hr/day medical supervision in concert with the patient's rehab needs make it unreasonable for this patient to be served in a less intensive setting? Yes 2. Co-Morbidities requiring supervision/potential complications: Gout, hypertension 3. Due to bladder management, bowel management, safety, skin/wound care, disease management, medication administration, pain management and patient education, does the patient require 24 hr/day rehab nursing? Yes 4. Does the patient require coordinated care of a physician, rehab nurse, PT (1-2 hrs/day, 5 days/week), OT (1-2 hrs/day, 5 days/week) and SLP (0.5-1 hrs/day, 5 days/week) to address physical and functional deficits in the context of the above medical diagnosis(es)? Yes Addressing deficits in the following areas: balance, endurance, locomotion, strength, transferring, bowel/bladder control, bathing, dressing, feeding, grooming, toileting and cognition 5. Can the patient actively participate in an intensive therapy program of at least 3 hrs of therapy per day at least 5 days per week? Yes 6. The potential for patient to make measurable gains while on inpatient rehab is good 7. Anticipated functional outcomes upon discharge from inpatient rehab are min assist mobility with PT, min assist ADLs with OT, improve awareness of deficits, medication management with SLP. 8. Estimated rehab length of stay to reach the above functional goals is: 3 weeks 9. Does the patient have adequate social supports to accommodate these discharge functional goals? Potentially 10. Anticipated D/C setting: Home 11. Anticipated post D/C treatments: HH therapy 12. Overall Rehab/Functional Prognosis: good  RECOMMENDATIONS: This patient's condition is appropriate for continued rehabilitative care in the  following setting: CIR Patient has agreed to participate in recommended program. Yes Note that insurance prior authorization may be required for reimbursement for recommended care.  Comment: Son can assist at home 24 7    02/02/2013

## 2013-02-02 NOTE — Progress Notes (Signed)
Physical Therapy Evaluation  Past Medical History  Diagnosis Date  . Gout   . Hypertension    History reviewed. No pertinent past surgical history.   02/02/13 0700  PT Visit Information  Last PT Received On 02/02/13  Assistance Needed +2  History of Present Illness Bruce Guerrero is an 52 y.o. male. While at work he noted he had right leg weakness which hen progressed to right arm weakness by the end of the day. He did not seek medical attention at that time. He noted right facial droop along with previous symptoms. On consultation his right arm is flaccid (Per patient he was able to move his right arm on arrival to ED) with right leg paresis and right facial droop. HE states he does take his medication daily including his ASA.   Precautions  Precautions Fall  Restrictions  Weight Bearing Restrictions No  Home Living  Family/patient expects to be discharged to: Private residence  Living Arrangements Children (2 sons)  Available Help at Discharge Family  Type of Home House  Home Access Stairs to enter  Entrance Stairs-Number of Steps 2  Home Layout Two level;Able to live on main level with bedroom/bathroom  Alternate Level Stairs-Number of Steps 8  Alternate Level Stairs-Rails Can reach both  Home Equipment None  Prior Function  Level of Independence Independent  Communication  Communication Expressive difficulties (increased time required, occasionally difficult to [place])  Cognition  Arousal/Alertness Lethargic  Behavior During Therapy [facility] for tasks assessed/performed  Overall Cognitive Status Within Functional Limits for tasks assessed  Upper Extremity Assessment  Upper Extremity Assessment RUE deficits/detail  RUE Deficits / Details flaccid  Lower Extremity Assessment  Lower Extremity Assessment RLE deficits/detail;LLE deficits/detail  RLE Deficits / Details unable to achieve voluntary motion against gravity.  Need to assess in gravity eliminated position next  session  RLE Sensation (can feel light touch and pain)  LLE Deficits / Details WNL  Bed Mobility  Bed Mobility Not assessed  Transfers  Transfers Sit to Stand;Stand to Sit  Sit to Stand 2: Max assist  Stand to Sit 2: Max assist  Details for Transfer Assistance Need to block R leg from buckling.  Also needs trunk support for control during standing.  R arm is flaccid and requires assistance for stability to avoid subluxation.    Ambulation/Gait  Ambulation/Gait Assistance 1: +2 Total assist  Ambulation/Gait: Patient Percentage 30%  Ambulation Distance (Feet) 5 Feet  Assistive device 2 person hand held assist  Ambulation/Gait Assistance Details Block R leg, support trunk during weight shift.  Assist step on R side with hand or leg to advance.  Gait Pattern Step-to pattern  Stairs No  Modified Rankin (Stroke Patients Only)  Pre-Morbid Rankin Score 0  Modified Rankin 5  Balance  Balance Assessed Yes  Dynamic Standing Balance  Dynamic Standing - Balance Support During functional activity  Dynamic Standing - Level of Assistance 2: Max assist  Dynamic Standing - Balance Activities Reaching across midline;Reaching for objects  Dynamic Standing - Comments block right knee and assist trunk during activities  PT - End of Session  Equipment Utilized During Treatment Gait belt  Activity Tolerance Patient tolerated treatment well;Patient limited by fatigue  Patient left in chair;with call bell/phone within reach  Nurse Communication Mobility status  PT Assessment  PT Recommendation/Assessment Patient needs continued PT services  PT Problem List Decreased strength;Decreased activity tolerance;Decreased balance;Decreased mobility;Decreased coordination  PT Therapy Diagnosis  Difficulty walking;Generalized weakness;Hemiplegia dominant side  PT Plan  PT Frequency Min 4X/week  PT Treatment/Interventions DME instruction;Gait training;Functional mobility training;Therapeutic activities;Therapeutic  exercise;Balance training;Neuromuscular re-education;Patient/family education  PT Recommendation  Recommendations for Other Services Rehab consult  Follow Up Recommendations CIR  PT equipment None recommended by PT  Individuals Consulted  Consulted and Agree with Results and Recommendations Patient;Family member/caregiver  Family Member Consulted youngest son  Acute Rehab PT Goals  Patient Stated Goal get better  PT Goal Formulation With patient  Time For Goal Achievement 02/16/13  Potential to Achieve Goals Good  PT Time Calculation  PT Start Time 0742  PT Stop Time 0807  PT Time Calculation (min) 25 min   Barrie Dunker, SPT Pager:  [phone]

## 2013-02-02 NOTE — Clinical Social Work Placement (Addendum)
Clinical Social Work Department CLINICAL SOCIAL WORK PLACEMENT NOTE 02/04/2013  Patient:  Bruce Guerrero, Bruce Guerrero  Account Number:  [id number] Admit date:  02/01/2013  Clinical Social Worker:  Irving Burton [place], LCSWA  Date/time:  02/02/2013 02:46 PM  Clinical Social Work is seeking post-discharge placement for this patient at the following level of care:   SKILLED NURSING   (*CSW will update this form in Epic as items are completed)   02/02/2013  Patient/family provided with [facility] Department of Clinical Social Works list of facilities offering this level of care within the geographic area requested by the patient (or if unable, by the patients family).  02/02/2013  Patient/family informed of their freedom to choose among providers that offer the needed level of care, that participate in Medicare, Medicaid or managed care program needed by the patient, have an available bed and are willing to accept the patient.  02/02/2013  Patient/family informed of [facility] ownership interest in [facility], as well as of the fact that they are under no obligation to receive care at this facility.  PASARR submitted to EDS on 02/02/2013 PASARR number received from EDS on 02/02/2013  FL2 transmitted to all facilities in geographic area requested by pt/family on  02/02/2013 FL2 transmitted to all facilities within larger geographic area on   Patient informed that his/her managed care company has contracts with or will negotiate with  certain facilities, including the following:     Patient/family informed of bed offers received:  02/04/2013 Patient chooses bed at  Physician recommends and patient chooses bed at    Patient to be transferred to  on   Patient to be transferred to facility by   The following physician request were entered in Epic:   Additional Comments:   [name], Theresia Majors Clinical Social Worker [phone]

## 2013-02-02 NOTE — Evaluation (Signed)
Speech Language Pathology Evaluation Patient Details Name: Bruce Guerrero MRN: [id number] DOB: [birth year] Today's Date: 02/02/2013 Time: 7829-5621 SLP Time Calculation (min): 12 min  Problem List:  Patient Active Problem List   Diagnosis Date Noted  . CVA (cerebral infarction) 02/01/2013  . Gout 08/05/2012  . HTN (hypertension) 08/05/2012   Past Medical History:  Past Medical History  Diagnosis Date  . Gout   . Hypertension    Past Surgical History: History reviewed. No pertinent past surgical history. HPI:  52 y.o. male with new onset right face, arm and leg weakness;  MRI revealed acute infarct in the deep white matter on the left adjacent to the ventricle. This measures 15 x 23 mm and is a moderately large white matter infarct.    Assessment / Plan / Recommendation Clinical Impression  Pt with acute left subcortical CVA presents with a mild dysarthria of speech and cranial nerve deficits.  Basic elements of language/cognition are intact.  Passed RN stroke swallow screen and reports no difficulty swallowing.   Rec SLP f/u for mild dysarthria; consideration of CIR given premorbid status and work Counselling psychologist as Production designer, theatre/television/film of a Theatre manager.        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[place])   Cognition  Overall Cognitive Status: Within Functional Limits for tasks assessed    Comprehension  Auditory Comprehension Overall Auditory Comprehension: Appears within functional limits for tasks assessed Yes/No Questions: Within Functional Limits Commands: Within Functional Limits Visual  Recognition/Discrimination Discrimination: Within Function Limits Reading Comprehension Reading Status: Within funtional limits    Expression Expression Primary Mode of Expression: Verbal Verbal Expression Overall Verbal Expression: Appears within functional limits for tasks assessed Written Expression Dominant Hand: Right   Oral / Motor Oral Motor/Sensory Function Overall Oral Motor/Sensory Function: Impaired (Impaired right CN V, VII, XII) Motor Speech Overall Motor Speech: Impaired Articulation: Impaired Level of Impairment: Conversation Intelligibility: Intelligibility reduced Phrase: 75-100% accurate Sentence: 75-100% accurate Conversation: 75-100% accurate   Sofya Moustafa L. Samson Frederic, [place] CCC/SLP Pager [phone]      Blenda Mounts Laurice 02/02/2013, 9:34 AM

## 2013-02-02 NOTE — Progress Notes (Signed)
Stroke Team Progress Note  HISTORY Bruce Guerrero is an 52 y.o. male who was normal yesterday.. While at work he noted he had right leg weakness which hen progressed to right arm weakness by the end of the day. He did not seek medical attention at that time. This AM he noted right facial droop along with previous symptoms. On consultation his right arm is flaccid (Per patient he was able to move his right arm on arrival to ED) with right leg paresis and right facial droop. HE states he does take his medication daily including his ASA.   Date last known well: Date: 01/31/2013  Time last known well: Time: 10:00  tPA Given: No: out of window  Patient was not a TPA candidate secondary to out of treatment window. He was admitted  for further evaluation and treatment.  SUBJECTIVE No new neurological changes.   OBJECTIVE Most recent Vital Signs: Filed Vitals:   02/01/13 1500 02/01/13 2154 02/02/13 0119 02/02/13 0530  BP:  154/81 148/72 152/82  Pulse:  78 71 72  Temp:  97.5 F (36.4 C) 97.6 F (36.4 C) 97.7 F (36.5 C)  TempSrc:  Axillary Axillary Oral  Resp:  18 18 18   Height: 5\' 4"  (1.626 m)     Weight: 87.045 kg (191 lb 14.4 oz)     SpO2:  97% 99% 97%   CBG (last 3)   Recent Labs  02/01/13 0859  GLUCAP 97    IV Fluid Intake:     MEDICATIONS  . aspirin EC  325 mg Oral Daily  . heparin  5,000 Units Subcutaneous Q8H  . influenza vac split quadrivalent PF  0.5 mL Intramuscular Tomorrow-1000  . LORazepam  1 mg Intravenous Once   PRN:  albuterol, guaiFENesin-dextromethorphan, hydrALAZINE, HYDROcodone-acetaminophen, ondansetron (ZOFRAN) IV, ondansetron, polyethylene glycol  Diet:  Cardiac thin liquids Activity:  Ambulate up to chair DVT Prophylaxis:  SCD  CLINICALLY SIGNIFICANT STUDIES Basic Metabolic Panel:  Recent Labs Lab 02/01/13 0836 02/01/13 0911 02/02/13 0356  NA 139 141 136  K 3.7 3.8 3.4*  CL 100 102 98  CO2 25  --  24  GLUCOSE 96 98 99  BUN 15 14 14    CREATININE 1.13 1.30 1.01  CALCIUM 9.8  --  9.7   Liver Function Tests:  Recent Labs Lab 02/01/13 0836  AST 17  ALT 20  ALKPHOS 88  BILITOT 0.5  PROT 7.5  ALBUMIN 4.4   CBC:  Recent Labs Lab 02/01/13 0836 02/01/13 0911 02/02/13 0356  WBC 8.8  --  11.5*  NEUTROABS 5.9  --   --   HGB 17.3* 17.0 17.3*  HCT 48.5 50.0 48.4  MCV 91.7  --  94.2  PLT 187  --  178   Coagulation:  Recent Labs Lab 02/01/13 0836  LABPROT 12.2  INR 0.92   Cardiac Enzymes:  Recent Labs Lab 02/01/13 1145 02/01/13 1629 02/01/13 2255  TROPONINI <0.30 <0.30 <0.30   Urinalysis:  Recent Labs Lab 02/01/13 1140  COLORURINE YELLOW  LABSPEC 1.013  PHURINE 6.0  GLUCOSEU NEGATIVE  HGBUR MODERATE*  BILIRUBINUR NEGATIVE  KETONESUR NEGATIVE  PROTEINUR NEGATIVE  UROBILINOGEN 0.2  NITRITE NEGATIVE  LEUKOCYTESUR NEGATIVE   Lipid Panel    Component Value Date/Time   CHOL 207* 02/02/2013 0356   TRIG 180* 02/02/2013 0356   HDL 30* 02/02/2013 0356   CHOLHDL 6.9 02/02/2013 0356   VLDL 36 02/02/2013 0356   LDLCALC 141* 02/02/2013 0356   HgbA1C  Lab Results  Component Value Date   HGBA1C 5.5 02/01/2013    Urine Drug Screen:     Component Value Date/Time   LABOPIA NONE DETECTED 02/01/2013 1140   COCAINSCRNUR NONE DETECTED 02/01/2013 1140   LABBENZ NONE DETECTED 02/01/2013 1140   AMPHETMU NONE DETECTED 02/01/2013 1140   THCU NONE DETECTED 02/01/2013 1140   LABBARB NONE DETECTED 02/01/2013 1140    Alcohol Level:  Recent Labs Lab 02/01/13 0836  ETH <11    Ct Head [name] 02/01/2013    1. There is no evidence of an acute ischemic or hemorrhagic infarction. 2. There is no intracranial mass effect nor hydrocephalus. 3. If there is strong clinical concerns of acute ischemic processes, MRI would be useful if the patient can tolerate the procedure.      Bruce Guerrero [name] 02/01/2013 Image quality degraded by motion. The patient was claustrophobic. Postcontrast images are  especially degraded by motion.  Acute infarct in the deep white matter on the left adjacent to the ventricle. This measures 15 x 23 mm and is a moderately large white matter infarct. No other acute infarct.  Ventricle size is normal. No midline shift. Mild chronic microvascular ischemic change in the white matter. No hemorrhage or mass lesion. No enhancing lesions are seen postcontrast.    Bruce Guerrero 02/01/2013    Moderately large deep white matter infarct on the left.  MRA is significantly degraded by motion. No large vessel occlusion is identified.  No significant carotid artery stenosis  Severe stenosis versus and tortuosity at the origin of the vertebral artery bilaterally. Left vertebral artery is dominant.    Bruce Guerrero [name] 02/01/2013    Image quality degraded by motion. The patient was claustrophobic. Postcontrast images are especially degraded by motion.  Acute infarct in the deep white matter on the left adjacent to the ventricle. This measures 15 x 23 mm and is a moderately large white matter infarct. No other acute infarct.  Ventricle size is normal. No midline shift. Mild chronic microvascular ischemic change in the white matter. No hemorrhage or mass lesion. No enhancing lesions are seen postcontrast.   2D Echocardiogram   EF 55%, wall motion normal, LA normal in size, no ASD or PFO  Carotid Doppler   Bilateral - 1% to 39% ICA stenosis. Vertebral artery flow is antegrade.   CXR    EKG  normal sinus rhythm.   Therapy Recommendations CIR  Physical Exam   Alert, oriented, thought content appropriate. Speech dysarthric without evidence of aphasia. Able to follow 3 step commands without difficulty.  Cranial Nerves:  II: Discs flat bilaterally; Visual fields grossly normal, pupils equal, round, reactive to light and accommodation  III,IV, VI: ptosis not present, extra-ocular motions intact bilaterally  V,VII: smile asymmetric on the right, facial light  touch sensation normal bilaterally  VIII: hearing normal bilaterally  IX,X: gag reflex present  XI: bilateral shoulder shrug  XII: midline tongue extension without atrophy or fasciculations  Motor:  Right : Upper extremity 0/5 Left: Upper extremity 5/5 can barely bend toes Lower extremity 1/5 Lower extremity 5/5  Tone and bulk:normal tone throughout; no atrophy noted  Sensory: Pinprick and light touch intact throughout, bilaterally  Deep Tendon Reflexes:  Right: Upper Extremity Left: Upper extremity  biceps (C-5 to C-6) 3/4 biceps (C-5 to C-6) 2/4  tricep (C7) 2/4 triceps (C7) 2/4  Brachioradialis (C6) 3/4 Brachioradialis (C6) 2/4  Lower Extremity Lower Extremity  quadriceps (L-2 to L-4) 2/4  quadriceps (L-2 to L-4) 2/4  Achilles (S1) 2/4 Achilles (S1) 2/4  Plantars:  Right: upgoing Left: downgoing  Cerebellar:  normal finger-to-nose, normal heel-to-shin test on the left  Gait: not tested  CV: pulses palpable throughout     ASSESSMENT Bruce Guerrero is a 52 y.o. male presenting with right hemiparesis, right facial droop. Imaging confirms a moderately large deep white matter infarct on the left.  Infarct felt to be thrombotic secondary to small vessel disease.  On aspirin 325 mg orally every day prior to admission. Now on aspirin 325 mg orally every day for secondary stroke prevention. Patient with resultant right hemiparesis. Work up completed.   Hyperlipidemia, LDL 141, goal < 100 in non diabetic patients, add statin  HGB A1C 5.5  hypertension  Hospital day # 1  TREATMENT/PLAN  Change to Plavix 75mg  daily for secondary stroke prevention.  Risk factor modification  Statin added.  Therapy as per recommendations.Likely need inpatient rehab  Have patient follow up with Dr. Pearlean Brownie in 2 months in stroke clinic.  Gwendolyn Lima. Manson Passey, [facility], MBA, MHA [facility] Pager: [phone] 02/02/2013 9:45 AM  I have personally obtained a history, examined the  patient, evaluated imaging results, and formulated the assessment and plan of care. I agree with the above. Delia Heady, MD

## 2013-02-02 NOTE — Evaluation (Signed)
Occupational Therapy Evaluation Patient Details Name: Bruce Guerrero MRN: [id number] DOB: [birth year] Today's Date: 02/02/2013 Time: 0865-7846 OT Time Calculation (min): 24 min  OT Assessment / Plan / Recommendation History of present illness Bruce Guerrero is an 52 y.o. male. While at work he noted he had right leg weakness which hen progressed to right arm weakness by the end of the day. He did not seek medical attention at that time. He noted right facial droop along with previous symptoms. On consultation his right arm is flaccid (Per patient he was able to move his right arm on arrival to ED) with right leg paresis and right facial droop. HE states he does take his medication daily including his ASA.    Clinical Impression   Pt presents with below problem list. Pt independent with ADLs, PTA. Pt will benefit from acute OT to increase independence prior to d/c. Recommending CIR for additional rehab.    OT Assessment  Patient needs continued OT Services    Follow Up Recommendations  CIR;Supervision/Assistance - 24 hour    Barriers to Discharge      Equipment Recommendations  Other (comment) (defer to next venue)    Recommendations for Other Services Rehab consult  Frequency  Min 2X/week    Precautions / Restrictions Precautions Precautions: Fall Restrictions Weight Bearing Restrictions: No   Pertinent Vitals/Pain No pain reported.     ADL  Eating/Feeding: Minimal assistance Where Assessed - Eating/Feeding: Chair Grooming: Minimal assistance Where Assessed - Grooming: Supported sitting Upper Body Bathing: Minimal assistance Where Assessed - Upper Body Bathing: Supported sitting Lower Body Bathing: Maximal assistance Where Assessed - Lower Body Bathing: Lean right and/or left Upper Body Dressing: Moderate assistance Where Assessed - Upper Body Dressing: Supported sitting Lower Body Dressing: Maximal assistance Where Assessed - Lower Body Dressing: Lean right  and/or left Toilet Transfer: +2 Total assistance Toilet Transfer: Patient Percentage: 40% (70% for sit to stand from [facility]; Stand pivot 40%) Toilet Transfer Method: Sit to stand;Stand pivot Toilet Transfer Equipment: Bedside commode Toileting - Clothing Manipulation and Hygiene: Maximal assistance Where Assessed - Engineer, mining and Hygiene: Lean right and/or left Tub/Shower Transfer Method: Not assessed Equipment Used: Gait belt Transfers/Ambulation Related to ADLs: +2 Total A (50%) for sit <> stand transfer and 40% for stand pivot transfer. ADL Comments: Educated on retrograde massage for right hand to help decrease edema as well as gently using left hand to move right digits. Educated to position Rt arm on pillow whether sitting in chair or laying in bed. Educated family that they can perform hand over hand to use right upper extremities for activities. Educated on dressing technique. Educated that weightbearing through right arm is beneficial. Educated to decrease sodium intake as it increases chance for stroke.    OT Diagnosis: Hemiplegia dominant side  OT Problem List: Decreased strength;Decreased activity tolerance;Impaired balance (sitting and/or standing);Impaired vision/perception;Impaired UE functional use;Increased edema;Impaired sensation;Decreased knowledge of precautions;Decreased knowledge of use of DME or AE;Decreased coordination OT Treatment Interventions: Self-care/ADL training;Therapeutic exercise;Neuromuscular education;DME and/or AE instruction;Therapeutic activities;Visual/perceptual remediation/compensation;Patient/family education;Balance training   OT Goals(Current goals can be found in the care plan section) Acute Rehab OT Goals Patient Stated Goal: not stated OT Goal Formulation: With patient Time For Goal Achievement: 02/09/13 Potential to Achieve Goals: Good ADL Goals Pt Will Perform Grooming: with set-up;with supervision;sitting Pt Will Perform  Upper Body Bathing: with set-up;with supervision;sitting Pt Will Perform Lower Body Bathing: with min assist;sitting/lateral leans Pt Will Perform Upper Body Dressing: with set-up;with supervision;sitting  Pt Will Perform Lower Body Dressing: with min assist;sitting/lateral leans Pt Will Transfer to Toilet: with min assist;stand pivot transfer;bedside commode Pt Will Perform Toileting - Clothing Manipulation and hygiene: sitting/lateral leans;with min assist Additional ADL Goal #1: Pt will be independent in positioning RUE appropriately.  Visit Information  Last OT Received On: 02/02/13 Assistance Needed: +2 History of Present Illness: Bruce Guerrero is an 52 y.o. male. While at work he noted he had right leg weakness which hen progressed to right arm weakness by the end of the day. He did not seek medical attention at that time. He noted right facial droop along with previous symptoms. On consultation his right arm is flaccid (Per patient he was able to move his right arm on arrival to ED) with right leg paresis and right facial droop. HE states he does take his medication daily including his ASA.        Prior Functioning     Home Living Family/patient expects to be discharged to:: Private residence Living Arrangements: Children (2 sons) Available Help at Discharge: Family Type of Home: House Home Access: Stairs to enter [employer] of Steps: 2 Home Layout: Two level;Able to live on main level with bedroom/bathroom Alternate Level Stairs-Number of Steps: 8 Alternate Level Stairs-Rails: Can reach both Home Equipment: None  Lives With: Son (77 year old son; son not employed) Prior Function Level of Independence: Independent Communication Communication: Expressive difficulties (increased time) Dominant Hand: Right         Vision/Perception Vision - Assessment Vision Assessment: Vision tested Tracking/Visual Pursuits: Other (comment) (difficult with tracking on  both sides) Visual Fields: Other (comment) (inconsistent with field testing in Rt superior quadrant)   Cognition  Cognition Arousal/Alertness: Lethargic Behavior During Therapy: WFL for tasks assessed/performed Overall Cognitive Status: Within Functional Limits for tasks assessed    Extremity/Trunk Assessment Upper Extremity Assessment Upper Extremity Assessment: RUE deficits/detail RUE Deficits / Details: flaccid RUE Sensation: decreased light touch RUE Coordination: decreased fine motor;decreased gross motor Lower Extremity Assessment Lower Extremity Assessment: Defer to PT evaluation     Mobility Bed Mobility Bed Mobility: Not assessed Transfers Transfers: Sit to Stand;Stand to Sit Sit to Stand: 1: +2 Total assist;From chair/3-in-1 Sit to Stand: Patient Percentage: 50% Stand to Sit: 1: +2 Total assist;To chair/3-in-1 Stand to Sit: Patient Percentage: 50% Details for Transfer Assistance: Stand pivot +2 Total (40%). Blocking right knee and also helped move right leg during transfer. Tried to have pt weightbear with RUE during transfers.      Exercise     Balance     End of Session OT - End of Session Equipment Utilized During Treatment: Gait belt Activity Tolerance: Patient tolerated treatment well Patient left: in chair;with call bell/phone within reach;with chair alarm set;with family/visitor present  GO     Earlie Raveling OTR/L [phone] 02/02/2013, 1:10 PM

## 2013-02-02 NOTE — Clinical Social Work Note (Signed)
CSW received notice from [employer] planning/management officer regarding financial questions from pt. CSW left message with financial counselor Arline Asp, [phone]) leaving referral for someone to speak with pt and his son at their earliest convenience regarding the pt's concerns. CSW to follow-up on Friday.  [name], LCSWA Clinical Social Worker [phone]

## 2013-02-02 NOTE — Progress Notes (Deleted)
Physical Therapy Treatment Patient Details Name: Bruce Guerrero MRN: [id number] DOB: 01/28/1961 Today's Date: 02/02/2013 Time: 7829-5621 PT Time Calculation (min): 25 min  PT Assessment / Plan / Recommendation  History of Present Illness Bruce Guerrero is an 52 y.o. male. While at work he noted he had right leg weakness which hen progressed to right arm weakness by the end of the day. He did not seek medical attention at that time. He noted right facial droop along with previous symptoms. On consultation his right arm is flaccid (Per patient he was able to move his right arm on arrival to ED) with right leg paresis and right facial droop. HE states he does take his medication daily including his ASA.    PT Comments   Patient is lethargic this morning but is compliant to work with PT.  He states that he thinks he can do more than he has been doing, and wants to try to do more.  Patient has limited functional mobility at this acute stage of CVA that could limit him once d/c'ed that needs to be addressed.  Due to his prognosis and willingness to work towards improvement, I believe Pt is an excellent for Inpatient Rehab to increase his function as he continues to work with Acute Care PT.  Follow Up Recommendations  CIR     Does the patient have the potential to tolerate intense rehabilitation   Yes  Barriers to Discharge        Equipment Recommendations  None recommended by PT    Recommendations for Other Services Rehab consult  Frequency Min 4X/week         Precautions / Restrictions Precautions Precautions: Fall Restrictions Weight Bearing Restrictions: No   Pertinent Vitals/Pain None reported    Mobility  Bed Mobility Bed Mobility: Not assessed Transfers Transfers: Sit to Stand;Stand to Sit Sit to Stand: 2: Max assist x 3 reps  Stand to Sit: 2: Max assist Details for Transfer Assistance: Need to block R leg from buckling.  Also needs trunk support for control during  standing.  R arm is flaccid and requires assistance for stability to avoid subluxation.   Ambulation/Gait Ambulation/Gait Assistance: 1: +2 Total assist Ambulation/Gait: Patient Percentage: 30% Ambulation Distance (Feet): 5 Feet Assistive device: 2 person hand held assist Ambulation/Gait Assistance Details: Block R leg, support trunk during weight shift.  Assist step on R side with hand or leg to advance. Gait Pattern: Step-to pattern Stairs: No Modified Rankin (Stroke Patients Only) Pre-Morbid Rankin Score: No symptoms Modified Rankin: Severe disability    Exercises     PT Diagnosis: Difficulty walking;Generalized weakness;Hemiplegia dominant side  PT Problem List: Decreased strength;Decreased activity tolerance;Decreased balance;Decreased mobility;Decreased coordination PT Treatment Interventions: DME instruction;Gait training;Functional mobility training;Therapeutic activities;Therapeutic exercise;Balance training;Neuromuscular re-education;Patient/family education   PT Goals (current goals can now be found in the care plan section) Acute Rehab PT Goals Patient Stated Goal: get better PT Goal Formulation: With patient Time For Goal Achievement: 02/16/13 Potential to Achieve Goals: Good  Visit Information  Last PT Received On: 02/02/13 History of Present Illness: Bruce Guerrero is an 52 y.o. male. While at work he noted he had right leg weakness which hen progressed to right arm weakness by the end of the day. He did not seek medical attention at that time. He noted right facial droop along with previous symptoms. On consultation his right arm is flaccid (Per patient he was able to move his right arm on arrival to ED) with right  leg paresis and right facial droop. HE states he does take his medication daily including his ASA.     Subjective Data  Patient Stated Goal: get better   Cognition  Cognition Arousal/Alertness: Lethargic Behavior During Therapy: WFL for tasks  assessed/performed Overall Cognitive Status: Within Functional Limits for tasks assessed    Balance  Balance Balance Assessed: Yes Dynamic Standing Balance Dynamic Standing - Balance Support: During functional activity Dynamic Standing - Level of Assistance: 2: Max assist Dynamic Standing - Balance Activities: Reaching across midline;Reaching for objects Dynamic Standing - Comments: block right knee and assist trunk during activities  End of Session PT - End of Session Equipment Utilized During Treatment: Gait belt Activity Tolerance: Patient tolerated treatment well;Patient limited by fatigue Patient left: in chair;with call bell/phone within reach Nurse Communication: Mobility status   GP    Barrie Dunker, SPT Pager:  [phone]  Barrie Dunker 02/02/2013, 8:35 AM

## 2013-02-02 NOTE — Progress Notes (Signed)
   CARE MANAGEMENT NOTE 02/02/2013  Patient:  Bruce Guerrero, Bruce Guerrero   Account Number:  [id number]  Date Initiated:  02/02/2013  Documentation initiated by:  Jiles Crocker  Subjective/Objective Assessment:   ADMITTED WITH CVA     Action/Plan:   CM FOLLOWING FOR DCP   Anticipated DC Date:  02/08/2013   Anticipated DC Plan:  POSSIBLY HOME W HOME HEALTH SERVICES, AWAITING FOR PT/OT EVALS      Status of service:  In process, will continue to follow Medicare Important Message given?  NA - LOS <3 / Initial given by admissions (If response is "NO", the following Medicare IM given date fields will be blank)  Per UR Regulation:  Reviewed for med. necessity/level of care/duration of stay  Comments:  11/26/2014Abelino Derrick RN,BSN,MHA [phone]

## 2013-02-02 NOTE — Progress Notes (Signed)
DENISE BRAMBLETT ZOX:[id number] DOB: November 06, 1960 DOA: 02/01/2013 PCP: Rudi Heap, MD  Brief narrative: 52 yr old ?, no specific prior history other than hyperlipidemia hypertension admitted 02/01/13 with facial droop, slurred speech, right-sided weakness and had absolute flaccid paralysis right-sided facial droop dysarthria and expressive face he. Clinically had acute CVA and workup ensued.  TPA not given because of time interval that had passed since symptoms started    Past medical history-As per Problem list Chart reviewed as below- Reviewed  Consultants:  Neurology  Procedures:  Multiple  Antibiotics:  None   Subjective  Frustrated at his functional deficits but resigned to working with therapy. States that he has occasional spasms of his muscles. Admits that he has not been completely compliant on some of his meds    Objective    Interim History: None  Telemetry: Sinus rhythm sinus tach   Objective: Filed Vitals:   02/02/13 0530 02/02/13 0952 02/02/13 1343 02/02/13 1803  BP: 152/82 156/84 160/81 189/98  Pulse: 72 95 74 93  Temp: 97.7 F (36.5 C) 97.3 F (36.3 C) 98 F (36.7 C) 97.8 F (36.6 C)  TempSrc: Oral Oral Oral Oral  Resp: 18 20 18 20   Height:      Weight:      SpO2: 97% 97%  99%    Intake/Output Summary (Last 24 hours) at 02/02/13 1911 Last data filed at 02/02/13 1359  Gross per 24 hour  Intake    480 ml  Output   1225 ml  Net   -745 ml    Exam:  General: Alert pleasant  Cardiovascular: S1-S2 no murmur rub or gallop see above  Respiratory: Clinically clear  Abdomen: Soft nontender nondistended  Skinno lower extremity  Neurocomplete hemiplegia right side, sensory intact bilaterally however he reflexes are brisk 3+ bilaterally. Finger-nose-finger normal on left however cannot perform on right as is completely flaccid.  Data Reviewed: Basic Metabolic Panel:  Recent Labs Lab 02/01/13 0836 02/01/13 0911 02/02/13 0356  NA 139  141 136  K 3.7 3.8 3.4*  CL 100 102 98  CO2 25  --  24  GLUCOSE 96 98 99  BUN 15 14 14   CREATININE 1.13 1.30 1.01  CALCIUM 9.8  --  9.7   Liver Function Tests:  Recent Labs Lab 02/01/13 0836  AST 17  ALT 20  ALKPHOS 88  BILITOT 0.5  PROT 7.5  ALBUMIN 4.4   No results found for this basename: LIPASE, AMYLASE,  in the last 168 hours No results found for this basename: AMMONIA,  in the last 168 hours CBC:  Recent Labs Lab 02/01/13 0836 02/01/13 0911 02/02/13 0356  WBC 8.8  --  11.5*  NEUTROABS 5.9  --   --   HGB 17.3* 17.0 17.3*  HCT 48.5 50.0 48.4  MCV 91.7  --  94.2  PLT 187  --  178   Cardiac Enzymes:  Recent Labs Lab 02/01/13 0836 02/01/13 1145 02/01/13 1629 02/01/13 2255  TROPONINI <0.30 <0.30 <0.30 <0.30   BNP: No components found with this basename: POCBNP,  CBG:  Recent Labs Lab 02/01/13 0859  GLUCAP 97    No results found for this or any previous visit (from the past 240 hour(s)).   Studies:              All Imaging reviewed and is as per above notation   Scheduled Meds: . atorvastatin  20 mg Oral q1800  . [START ON 02/03/2013] clopidogrel  75 mg  Oral Q breakfast  . heparin  5,000 Units Subcutaneous Q8H  . LORazepam  1 mg Intravenous Once   Continuous Infusions:    Assessment/Plan: 1. Deep white matter infarct left side-changed on this admission to Plavix 75 daily for secondary prevention. Dopplers 139% bilaterally, echocardiogram nonspecific for PFO 2. Hypertension-add Norvasc 10 mg tonight. Outside of the window of ischemic penumbra insult if tightly controlled 3. Cramps-unclear etiology. Monitor 4. Hyperlipidemia-add high intensity statin atorvastatin 80 mg 5. CKD stage I -currently stable  Code Status: Full  Family Communication: Discussed with the brother at bedside  Disposition Plan: Probable CIR?   Pleas Koch, MD  [facility] Pager [phone] 02/02/2013, 7:11 PM    LOS: 1 day

## 2013-02-02 NOTE — Progress Notes (Signed)
Agree with SPT.    Tylin Stradley, PT [phone]  

## 2013-02-03 ENCOUNTER — Inpatient Hospital Stay (HOSPITAL_COMMUNITY): Payer: BC Managed Care – PPO

## 2013-02-03 DIAGNOSIS — I6330 Cerebral infarction due to thrombosis of unspecified cerebral artery: Secondary | ICD-10-CM

## 2013-02-03 MED ORDER — CARVEDILOL 6.25 MG PO TABS
6.2500 mg | ORAL_TABLET | Freq: Two times a day (BID) | ORAL | Status: DC
  Administered 2013-02-03 – 2013-02-04 (×2): 6.25 mg via ORAL
  Filled 2013-02-03 (×4): qty 1

## 2013-02-03 MED ORDER — COLCHICINE 0.6 MG PO TABS
0.6000 mg | ORAL_TABLET | Freq: Every day | ORAL | Status: DC
  Administered 2013-02-03 – 2013-02-08 (×6): 0.6 mg via ORAL
  Filled 2013-02-03 (×6): qty 1

## 2013-02-03 MED ORDER — COLCHICINE 0.6 MG PO TABS
0.6000 mg | ORAL_TABLET | Freq: Once | ORAL | Status: AC
  Administered 2013-02-03: 0.6 mg via ORAL
  Filled 2013-02-03: qty 1

## 2013-02-03 MED ORDER — ALBUTEROL SULFATE (5 MG/ML) 0.5% IN NEBU
2.5000 mg | INHALATION_SOLUTION | Freq: Once | RESPIRATORY_TRACT | Status: AC
  Administered 2013-02-03: 2.5 mg via RESPIRATORY_TRACT
  Filled 2013-02-03: qty 0.5

## 2013-02-03 NOTE — Progress Notes (Signed)
Bruce Guerrero ZOX:[id number] DOB: May 12, 1960 DOA: 02/01/2013 PCP: Rudi Heap, MD  Brief narrative: 52 yr old ?, no specific prior history other than hyperlipidemia hypertension admitted 02/01/13 with facial droop, slurred speech, right-sided weakness and had absolute flaccid paralysis right-sided facial droop dysarthria and expressive face he. Clinically had acute CVA and workup ensued.  TPA not given because of time interval that had passed since symptoms started    Past medical history-As per Problem list Chart reviewed as below- Reviewed  Consultants:  Neurology  Procedures:  Multiple  Antibiotics:  None   Subjective  Frustrated at his functional deficits seems a little happier today. Friend in room. Had a chest x-ray as well as breathing treatment last night which was normal    Objective    Interim History: None  Telemetry: Sinus rhythm sinus tach   Objective: Filed Vitals:   02/03/13 0630 02/03/13 0940 02/03/13 1100 02/03/13 1500  BP:  147/85 145/88 155/84  Pulse:   74 86  Temp:   98 F (36.7 C) 98.1 F (36.7 C)  TempSrc:   Oral Oral  Resp:   18 18  Height:      Weight:      SpO2: 94%  98% 98%    Intake/Output Summary (Last 24 hours) at 02/03/13 1632 Last data filed at 02/03/13 1300  Gross per 24 hour  Intake    240 ml  Output    350 ml  Net   -110 ml    Exam:  General: Alert pleasant  Cardiovascular: S1-S2 no murmur rub or gallop see above  Respiratory: Clinically clear  Abdomen: Soft nontender nondistended  Skinno lower extremity  Neurocomplete hemiplegia right side, sensory intact bilaterally however he reflexes are brisk 3+ bilaterally. Finger-nose-finger normal on left however cannot perform on right as is completely flaccid.  Data Reviewed: Basic Metabolic Panel:  Recent Labs Lab 02/01/13 0836 02/01/13 0911 02/02/13 0356  NA 139 141 136  K 3.7 3.8 3.4*  CL 100 102 98  CO2 25  --  24  GLUCOSE 96 98 99  BUN 15 14 14     CREATININE 1.13 1.30 1.01  CALCIUM 9.8  --  9.7   Liver Function Tests:  Recent Labs Lab 02/01/13 0836  AST 17  ALT 20  ALKPHOS 88  BILITOT 0.5  PROT 7.5  ALBUMIN 4.4   No results found for this basename: LIPASE, AMYLASE,  in the last 168 hours No results found for this basename: AMMONIA,  in the last 168 hours CBC:  Recent Labs Lab 02/01/13 0836 02/01/13 0911 02/02/13 0356  WBC 8.8  --  11.5*  NEUTROABS 5.9  --   --   HGB 17.3* 17.0 17.3*  HCT 48.5 50.0 48.4  MCV 91.7  --  94.2  PLT 187  --  178   Cardiac Enzymes:  Recent Labs Lab 02/01/13 0836 02/01/13 1145 02/01/13 1629 02/01/13 2255  TROPONINI <0.30 <0.30 <0.30 <0.30   BNP: No components found with this basename: POCBNP,  CBG:  Recent Labs Lab 02/01/13 0859  GLUCAP 97    No results found for this or any previous visit (from the past 240 hour(s)).   Studies:              All Imaging reviewed and is as per above notation   Scheduled Meds: . amLODipine  10 mg Oral Daily  . atorvastatin  80 mg Oral q1800  . clopidogrel  75 mg Oral Q breakfast  .  colchicine  0.6 mg Oral Daily  . heparin  5,000 Units Subcutaneous Q8H  . LORazepam  1 mg Intravenous Once   Continuous Infusions:    Assessment/Plan: 1. Deep white matter infarct left side-changed on this admission to Plavix 75 daily for secondary prevention. Dopplers 1-39% bilaterally, echocardiogram nonspecific for PFO 2. Hypertension-add Norvasc 10 mg 11/26, added Coreg low dose 6.25 3. Cramps-unclear etiology. Monitor 4. Hyperlipidemia-add high intensity statin atorvastatin 80 mg 5. CKD stage I -currently stable  Code Status: Full  Family Communication: Discussed with the brother at bedside  Disposition Plan: Probable CIR in 1-2 days   Pleas Koch, MD  [facility] Pager [phone] 02/03/2013, 4:32 PM    LOS: 2 days

## 2013-02-04 LAB — CBC WITH DIFFERENTIAL/PLATELET
Basophils Absolute: 0 10*3/uL (ref 0.0–0.1)
Basophils Relative: 0 % (ref 0–1)
Eosinophils Absolute: 0.1 10*3/uL (ref 0.0–0.7)
Eosinophils Relative: 0 % (ref 0–5)
HCT: 45.2 % (ref 39.0–52.0)
Hemoglobin: 16.1 g/dL (ref 13.0–17.0)
Lymphocytes Relative: 11 % — ABNORMAL LOW (ref 12–46)
Lymphs Abs: 1.4 10*3/uL (ref 0.7–4.0)
MCH: 32.9 pg (ref 26.0–34.0)
MCHC: 35.6 g/dL (ref 30.0–36.0)
MCV: 92.4 fL (ref 78.0–100.0)
Monocytes Absolute: 1.3 10*3/uL — ABNORMAL HIGH (ref 0.1–1.0)
Monocytes Relative: 10 % (ref 3–12)
Neutro Abs: 10.3 10*3/uL — ABNORMAL HIGH (ref 1.7–7.7)
Neutrophils Relative %: 79 % — ABNORMAL HIGH (ref 43–77)
Platelets: 167 10*3/uL (ref 150–400)
RBC: 4.89 MIL/uL (ref 4.22–5.81)
RDW: 13.6 % (ref 11.5–15.5)
WBC: 13.1 10*3/uL — ABNORMAL HIGH (ref 4.0–10.5)

## 2013-02-04 MED ORDER — CARVEDILOL 12.5 MG PO TABS
12.5000 mg | ORAL_TABLET | Freq: Two times a day (BID) | ORAL | Status: DC
  Administered 2013-02-05 – 2013-02-08 (×7): 12.5 mg via ORAL
  Filled 2013-02-04 (×9): qty 1

## 2013-02-04 NOTE — Progress Notes (Signed)
Bruce Guerrero ZOX:[id number] DOB: September 18, 1960 DOA: 02/01/2013 PCP: Rudi Heap, MD  Brief narrative: 52 yr old ?, no specific prior history other than hyperlipidemia hypertension admitted 02/01/13 with facial droop, slurred speech, right-sided weakness and had absolute flaccid paralysis right-sided facial droop dysarthria and expressive face he. Clinically had acute CVA and workup ensued.  TPA not given because of time interval that had passed since symptoms started    Past medical history-As per Problem list Chart reviewed as below- Reviewed  Consultants:  Neurology  Procedures:  Multiple  Antibiotics:  None   Subjective  Doing fair, son at bedside, left knee he still warm but less painful Tolerating diet fairly   Objective    Interim History: None  Telemetry: Sinus rhythm sinus tach   Objective: Filed Vitals:   02/03/13 2202 02/04/13 0227 02/04/13 0628 02/04/13 1300  BP: 129/74 135/77 159/81 154/80  Pulse: 85 72 77 81  Temp: 98 F (36.7 C) 97.7 F (36.5 C) 97.6 F (36.4 C) 98 F (36.7 C)  TempSrc: Oral Oral Oral Oral  Resp: 20 18 18 18   Height:      Weight:      SpO2: 95% 97% 97% 97%    Intake/Output Summary (Last 24 hours) at 02/04/13 1728 Last data filed at 02/04/13 1300  Gross per 24 hour  Intake    240 ml  Output    400 ml  Net   -160 ml    Exam:  General: Alert pleasant  Cardiovascular: S1-S2 no murmur rub or gallop see above  Respiratory: Clinically clear  Abdomen: Soft nontender nondistended  Skinno lower extremity  Neurocomplete hemiplegia right side  Data Reviewed: Basic Metabolic Panel:  Recent Labs Lab 02/01/13 0836 02/01/13 0911 02/02/13 0356  NA 139 141 136  K 3.7 3.8 3.4*  CL 100 102 98  CO2 25  --  24  GLUCOSE 96 98 99  BUN 15 14 14   CREATININE 1.13 1.30 1.01  CALCIUM 9.8  --  9.7   Liver Function Tests:  Recent Labs Lab 02/01/13 0836  AST 17  ALT 20  ALKPHOS 88  BILITOT 0.5  PROT 7.5  ALBUMIN  4.4   No results found for this basename: LIPASE, AMYLASE,  in the last 168 hours No results found for this basename: AMMONIA,  in the last 168 hours CBC:  Recent Labs Lab 02/01/13 0836 02/01/13 0911 02/02/13 0356 02/04/13 0333  WBC 8.8  --  11.5* 13.1*  NEUTROABS 5.9  --   --  10.3*  HGB 17.3* 17.0 17.3* 16.1  HCT 48.5 50.0 48.4 45.2  MCV 91.7  --  94.2 92.4  PLT 187  --  178 167   Cardiac Enzymes:  Recent Labs Lab 02/01/13 0836 02/01/13 1145 02/01/13 1629 02/01/13 2255  TROPONINI <0.30 <0.30 <0.30 <0.30   BNP: No components found with this basename: POCBNP,  CBG:  Recent Labs Lab 02/01/13 0859  GLUCAP 97    No results found for this or any previous visit (from the past 240 hour(s)).   Studies:              All Imaging reviewed and is as per above notation   Scheduled Meds: . amLODipine  10 mg Oral Daily  . atorvastatin  80 mg Oral q1800  . carvedilol  6.25 mg Oral BID WC  . clopidogrel  75 mg Oral Q breakfast  . colchicine  0.6 mg Oral Daily  . heparin  5,000 Units Subcutaneous  Q8H  . LORazepam  1 mg Intravenous Once   Continuous Infusions:    Assessment/Plan: 1. Deep white matter infarct left side-changed on this admission to Plavix 75 daily for secondary prevention. Dopplers 1-39% bilaterally, echocardiogram nonspecific for PFO 2. Hypertension-add Norvasc 10 mg 11/26, added Coreg low dose 6.25-increased on 01/2011.5 3. Cramps-unclear etiology. Monitor seems to have resolved 4. Hyperlipidemia-add high intensity statin atorvastatin 80 mg 5. CKD stage I -currently stable   Code Status: Full  Family Communication: Discussed with the brother at bedside  Disposition Plan: Probable CIR when available   Pleas Koch, MD  [facility] Pager [phone] 02/04/2013, 5:28 PM    LOS: 3 days

## 2013-02-04 NOTE — Progress Notes (Signed)
Physical Therapy Treatment Patient Details Name: Bruce Guerrero MRN: [id number] DOB: 1960-12-23 Today's Date: 02/04/2013 Time: 8657-8469 PT Time Calculation (min): 28 min  PT Assessment / Plan / Recommendation  History of Present Illness Bruce Guerrero is an 52 y.o. male. While at work he noted he had right leg weakness which hen progressed to right arm weakness by the end of the day. He did not seek medical attention at that time. He noted right facial droop along with previous symptoms. On consultation his right arm is flaccid (Per patient he was able to move his right arm on arrival to ED) with right leg paresis and right facial droop. HE states he does take his medication daily including his ASA.    PT Comments   Pt indicating he feels his gout is flaring up in his R knee.  RN made aware.  Gout affecting ability to block R knee as pain is in anterior R knee.  Will continue to follow.    Follow Up Recommendations  CIR     Does the patient have the potential to tolerate intense rehabilitation     Barriers to Discharge        Equipment Recommendations  None recommended by PT    Recommendations for Other Services    Frequency Min 4X/week   Progress towards PT Goals Progress towards PT goals: Progressing toward goals  Plan Current plan remains appropriate    Precautions / Restrictions Precautions Precautions: Fall Restrictions Weight Bearing Restrictions: No   Pertinent Vitals/Pain R knee gout flare up.  RN medicated.      Mobility  Bed Mobility Bed Mobility: Not assessed Transfers Transfers: Sit to Stand;Stand to Sit Sit to Stand: 1: +2 Total assist;With upper extremity assist;From chair/3-in-1 Sit to Stand: Patient Percentage: 50% Stand to Sit: 1: +2 Total assist;With upper extremity assist;To chair/3-in-1 Stand to Sit: Patient Percentage: 50% Details for Transfer Assistance: pt requires cues for L UE use and positioning LEs.  R LE blocked.  Facilitation to wt  shift towards L during trasnfers.   Ambulation/Gait Ambulation/Gait Assistance: 1: +2 Total assist Ambulation/Gait: Patient Percentage: 30% Ambulation Distance (Feet): 7 Feet Assistive device: 2 person hand held assist Ambulation/Gait Assistance Details: 2 person A to facilitate wt shifting, blocking R LE, movement of R LE, and for trunk extension.   Gait Pattern: Step-to pattern;Decreased step length - left;Decreased stance time - right;Trunk flexed;Lateral trunk lean to left Stairs: No Wheelchair Mobility Wheelchair Mobility: No Modified Rankin (Stroke Patients Only) Pre-Morbid Rankin Score: No symptoms Modified Rankin: Severe disability    Exercises     PT Diagnosis:    PT Problem List:   PT Treatment Interventions:     PT Goals (current goals can now be found in the care plan section) Acute Rehab PT Goals Time For Goal Achievement: 02/16/13 Potential to Achieve Goals: Good  Visit Information  Last PT Received On: 02/04/13 Assistance Needed: +2 History of Present Illness: Bruce Guerrero is an 52 y.o. male. While at work he noted he had right leg weakness which hen progressed to right arm weakness by the end of the day. He did not seek medical attention at that time. He noted right facial droop along with previous symptoms. On consultation his right arm is flaccid (Per patient he was able to move his right arm on arrival to ED) with right leg paresis and right facial droop. HE states he does take his medication daily including his ASA.     Subjective  Data  Subjective: I think my gout is flaring up.     Cognition  Cognition Arousal/Alertness: Awake/alert Behavior During Therapy: WFL for tasks assessed/performed Overall Cognitive Status: Within Functional Limits for tasks assessed    Balance  Balance Balance Assessed: No  End of Session PT - End of Session Equipment Utilized During Treatment: Gait belt Activity Tolerance: Patient tolerated treatment well Patient  left: in chair;with call bell/phone within reach;with family/visitor present Nurse Communication: Mobility status   GP     Sunny Schlein, [place] [phone] 02/04/2013, 10:43 AM

## 2013-02-05 LAB — BASIC METABOLIC PANEL
BUN: 34 mg/dL — ABNORMAL HIGH (ref 6–23)
Chloride: 95 mEq/L — ABNORMAL LOW (ref 96–112)
Creatinine, Ser: 1.6 mg/dL — ABNORMAL HIGH (ref 0.50–1.35)
GFR calc Af Amer: 56 mL/min — ABNORMAL LOW (ref >90)
GFR calc non Af Amer: 48 mL/min — ABNORMAL LOW (ref >90)
Glucose, Bld: 109 mg/dL — ABNORMAL HIGH (ref 70–99)
Potassium: 3.9 mEq/L (ref 3.5–5.1)

## 2013-02-05 NOTE — Progress Notes (Signed)
PT was found sitting on the edge of the bed. Son stated that pt felt that he was smothering while laying in the bed BP and O2 stats were taken and charted in epic. Lung sound were diminished in all quads. Pt requested to get in the chair after attempts were made to adjust the bed chair alarm was placed and pt was informed to call if sob or chest pain occurs. Will continue to monitor. Ilean Skill LPN

## 2013-02-05 NOTE — Progress Notes (Signed)
Bruce Guerrero:[id number] DOB: 12-05-1960 DOA: 02/01/2013 PCP: Rudi Heap, MD  Brief narrative: 52 yr old ?, no specific prior history other than hyperlipidemia hypertension admitted 02/01/13 with facial droop, slurred speech, right-sided weakness and had absolute flaccid paralysis right-sided facial droop dysarthria and expressive face he. Clinically had acute CVA and workup ensued.  TPA not given because of time interval that had passed since symptoms started    Past medical history-As per Problem list Chart reviewed as below- Reviewed  Consultants:  Neurology  Procedures:  Multiple  Antibiotics:  None   Subjective  Doing great. No issues. Tolerating diet fairly.  Is bored.   Objective    Interim History: None  Telemetry: Sinus rhythm sinus tach   Objective: Filed Vitals:   02/05/13 0401 02/05/13 0524 02/05/13 1008 02/05/13 1451  BP: 130/71 141/74 130/68 150/79  Pulse:  64 76 63  Temp:  97.5 F (36.4 C) 97.8 F (36.6 C) 97.7 F (36.5 C)  TempSrc:  Oral Oral Oral  Resp:  18 18 20   Height:      Weight:      SpO2:  96% 96% 98%    Intake/Output Summary (Last 24 hours) at 02/05/13 1641 Last data filed at 02/05/13 1452  Gross per 24 hour  Intake    680 ml  Output    600 ml  Net     80 ml    Exam:  General: Alert pleasant  Cardiovascular: S1-S2 no murmur rub or gallop see above  Respiratory: Clinically clear  Abdomen: Soft nontender nondistended  Skinno lower extremity  Neurocomplete hemiplegia right side  Data Reviewed: Basic Metabolic Panel:  Recent Labs Lab 02/01/13 0836 02/01/13 0911 02/02/13 0356 02/05/13 0632  NA 139 141 136 136  K 3.7 3.8 3.4* 3.9  CL 100 102 98 95*  CO2 25  --  24 28  GLUCOSE 96 98 99 109*  BUN 15 14 14  34*  CREATININE 1.13 1.30 1.01 1.60*  CALCIUM 9.8  --  9.7 10.1   Liver Function Tests:  Recent Labs Lab 02/01/13 0836  AST 17  ALT 20  ALKPHOS 88  BILITOT 0.5  PROT 7.5  ALBUMIN 4.4    No results found for this basename: LIPASE, AMYLASE,  in the last 168 hours No results found for this basename: AMMONIA,  in the last 168 hours CBC:  Recent Labs Lab 02/01/13 0836 02/01/13 0911 02/02/13 0356 02/04/13 0333  WBC 8.8  --  11.5* 13.1*  NEUTROABS 5.9  --   --  10.3*  HGB 17.3* 17.0 17.3* 16.1  HCT 48.5 50.0 48.4 45.2  MCV 91.7  --  94.2 92.4  PLT 187  --  178 167   Cardiac Enzymes:  Recent Labs Lab 02/01/13 0836 02/01/13 1145 02/01/13 1629 02/01/13 2255  TROPONINI <0.30 <0.30 <0.30 <0.30   BNP: No components found with this basename: POCBNP,  CBG:  Recent Labs Lab 02/01/13 0859  GLUCAP 97    No results found for this or any previous visit (from the past 240 hour(s)).   Studies:              All Imaging reviewed and is as per above notation   Scheduled Meds: . amLODipine  10 mg Oral Daily  . atorvastatin  80 mg Oral q1800  . carvedilol  12.5 mg Oral BID WC  . clopidogrel  75 mg Oral Q breakfast  . colchicine  0.6 mg Oral Daily  . heparin  5,000 Units Subcutaneous Q8H  . LORazepam  1 mg Intravenous Once   Continuous Infusions:    Assessment/Plan: 1. Deep white matter infarct left side-changed on this admission to Plavix 75 daily for secondary prevention. Dopplers 1-39% bilaterally, echocardiogram nonspecific for PFO 2. Hypertension-add Norvasc 10 mg 11/26, added Coreg low dose 6.25-increased on 01/2011.5 3. Cramps-unclear etiology. Monitor seems to have resolved 4. Hyperlipidemia-add high intensity statin atorvastatin 80 mg 5. CKD stage I -currently stable   Code Status: Full  Family Communication: Discussed with the brother at bedside  Disposition Plan: Probable CIR vs SNF when available   Pleas Koch, MD  [facility] Pager [phone] 02/05/2013, 4:41 PM    LOS: 4 days

## 2013-02-06 NOTE — Progress Notes (Signed)
Bruce Guerrero:[id number] DOB: Jun 17, 1960 DOA: 02/01/2013 PCP: Rudi Heap, MD  Brief narrative: 52 yr old ?, no specific prior history other than hyperlipidemia hypertension admitted 02/01/13 with facial droop, slurred speech, right-sided weakness and had absolute flaccid paralysis right-sided facial droop dysarthria and expressive face he. Clinically had acute CVA and workup ensued.  TPA not given because of time interval that had passed since symptoms started    Past medical history-As per Problem list Chart reviewed as below- Reviewed  Consultants:  Neurology  Procedures:  Multiple  Antibiotics:  None   Subjective  Doing great. No issues. Tolerating diet fairly.  Is bored.   Objective    Interim History: None  Telemetry: Sinus rhythm sinus tach   Objective: Filed Vitals:   02/01/13 1500 02/06/13 0209 02/06/13 0544 02/06/13 0943  BP:  176/92 131/75 128/70  Pulse:  69 61 71  Temp:  98 F (36.7 C) 97.8 F (36.6 C) 97.8 F (36.6 C)  TempSrc:  Oral Oral Oral  Resp:  20 20 20   Height: 5\' 4"  (1.626 m)     Weight: 87.045 kg (191 lb 14.4 oz)     SpO2:  99% 97% 98%    Intake/Output Summary (Last 24 hours) at 02/06/13 1503 Last data filed at 02/06/13 0700  Gross per 24 hour  Intake    220 ml  Output   1200 ml  Net   -980 ml    Exam:  General: Alert pleasant  Cardiovascular: S1-S2 no murmur rub or gallop see above  Respiratory: Clinically clear  Abdomen: Soft nontender nondistended  Skinno lower extremity  Neurocomplete hemiplegia right side  Data Reviewed: Basic Metabolic Panel:  Recent Labs Lab 02/01/13 0836 02/01/13 0911 02/02/13 0356 02/05/13 0632  NA 139 141 136 136  K 3.7 3.8 3.4* 3.9  CL 100 102 98 95*  CO2 25  --  24 28  GLUCOSE 96 98 99 109*  BUN 15 14 14  34*  CREATININE 1.13 1.30 1.01 1.60*  CALCIUM 9.8  --  9.7 10.1   Liver Function Tests:  Recent Labs Lab 02/01/13 0836  AST 17  ALT 20  ALKPHOS 88  BILITOT  0.5  PROT 7.5  ALBUMIN 4.4   No results found for this basename: LIPASE, AMYLASE,  in the last 168 hours No results found for this basename: AMMONIA,  in the last 168 hours CBC:  Recent Labs Lab 02/01/13 0836 02/01/13 0911 02/02/13 0356 02/04/13 0333  WBC 8.8  --  11.5* 13.1*  NEUTROABS 5.9  --   --  10.3*  HGB 17.3* 17.0 17.3* 16.1  HCT 48.5 50.0 48.4 45.2  MCV 91.7  --  94.2 92.4  PLT 187  --  178 167   Cardiac Enzymes:  Recent Labs Lab 02/01/13 0836 02/01/13 1145 02/01/13 1629 02/01/13 2255  TROPONINI <0.30 <0.30 <0.30 <0.30   BNP: No components found with this basename: POCBNP,  CBG:  Recent Labs Lab 02/01/13 0859  GLUCAP 97    No results found for this or any previous visit (from the past 240 hour(s)).   Studies:              All Imaging reviewed and is as per above notation   Scheduled Meds: . amLODipine  10 mg Oral Daily  . atorvastatin  80 mg Oral q1800  . carvedilol  12.5 mg Oral BID WC  . clopidogrel  75 mg Oral Q breakfast  . colchicine  0.6 mg Oral  Daily  . heparin  5,000 Units Subcutaneous Q8H  . LORazepam  1 mg Intravenous Once   Continuous Infusions:    Assessment/Plan: 1. Deep white matter infarct left side-changed on this admission to Plavix 75 daily for secondary prevention. Dopplers 1-39% bilaterally, echocardiogram nonspecific for PFO 2. Hypertension-add Norvasc 10 mg 11/26, added Coreg low dose 6.25-increased on 01/2011.5 3. Cramps-unclear etiology. Monitor seems to have resolved 4. Hyperlipidemia-add high intensity statin atorvastatin 80 mg 5. CKD stage I -currently stable   Code Status: Full  Family Communication: Discussed with the brother at bedside  Disposition Plan: Probable CIR vs SNF when available--likely discharge 12/1   Pleas Koch, MD  [facility] Pager [phone] 02/06/2013, 3:03 PM    LOS: 5 days

## 2013-02-07 MED ORDER — CARVEDILOL 12.5 MG PO TABS: 12.5000 mg | ORAL_TABLET | Freq: Two times a day (BID) | ORAL | Status: DC

## 2013-02-07 MED ORDER — ATORVASTATIN CALCIUM 80 MG PO TABS: 80.0000 mg | ORAL_TABLET | Freq: Every day | ORAL | Status: DC

## 2013-02-07 MED ORDER — AMLODIPINE BESYLATE 10 MG PO TABS: 10.0000 mg | ORAL_TABLET | Freq: Every day | ORAL | Status: DC

## 2013-02-07 MED ORDER — CLOPIDOGREL BISULFATE 75 MG PO TABS: 75.0000 mg | ORAL_TABLET | Freq: Every day | ORAL | Status: DC

## 2013-02-07 NOTE — Progress Notes (Signed)
Occupational Therapy Treatment Patient Details Name: Bruce Guerrero MRN: [id number] DOB: 09/09/1960 Today's Date: 02/07/2013 Time: 4098-1191 OT Time Calculation (min): 33 min  OT Assessment / Plan / Recommendation  History of present illness Bruce Guerrero is an 52 y.o. male. While at work he noted he had right leg weakness which hen progressed to right arm weakness by the end of the day. He did not seek medical attention at that time. He noted right facial droop along with previous symptoms. On consultation his right arm is flaccid (Per patient he was able to move his right arm on arrival to ED) with right leg paresis and right facial droop. HE states he does take his medication daily including his ASA.    OT comments  Pt progressing towards goals. Pt using hemi walker for ambulation today. Pt performed grooming tasks as well as UB/LB dressing and toileting. Continue to recommend CIR for additional rehab.   Follow Up Recommendations  CIR;Supervision/Assistance - 24 hour    Barriers to Discharge       Equipment Recommendations  Other (comment) (defer to next venue)    Recommendations for Other Services Rehab consult  Frequency Min 2X/week   Progress towards OT Goals Progress towards OT goals: Progressing toward goals  Plan Discharge plan remains appropriate    Precautions / Restrictions Precautions Precautions: Fall Restrictions Weight Bearing Restrictions: No   Pertinent Vitals/Pain Uncomfort in Rt knee with dressing. Repositioned.     ADL  Grooming: Wash/dry face;Teeth care;Brushing hair;Moderate assistance (OT performed hand over hand assistance as well) Where Assessed - Grooming: Supported sitting;Supported standing Upper Body Dressing: Minimal assistance Where Assessed - Upper Body Dressing: Supported sitting Lower Body Dressing: Minimal assistance (donned/doffed socks and pants) Where Assessed - Lower Body Dressing: Supported sit to Art therapist Transfer: +2  Total assistance;Moderate assistance Toilet Transfer: Patient Percentage: 40% Toilet Transfer Method: Sit to stand;Stand pivot (Mod A-sit to stand and +2 Total A Stand pivot) Toilet Transfer Equipment: Comfort height toilet Toileting - Clothing Manipulation and Hygiene: +2 Total assistance Toileting - Clothing Manipulation and Hygiene: Patient Percentage: 40% Where Assessed - Toileting Clothing Manipulation and Hygiene: Sit to stand from 3-in-1 or toilet Equipment Used: Gait belt;Other (comment) (hemi walker) Transfers/Ambulation Related to ADLs: +2 total A for stand pivot transfer (40%) and ambulation (50%). Mod A for sit to stand from commode and Min A from recliner chair. Min A for stand to sit transfer. ADL Comments: Educated and performed retrograde massage. Educated to use Lt hand to gently move Rt digits. Educated that it is beneficial to bear weight through RUE. OT assisted in positioning Rt arm on armrest for sit to stand transfer from chair. Pt practiced LB and UB dressing. Educated on dressing technique.  OT provided hand over hand assistance with grooming tasks and explained family can do this too. Pt stood at sink and brushed teeth.     OT Diagnosis:    OT Problem List:   OT Treatment Interventions:     OT Goals(current goals can now be found in the care plan section) Acute Rehab OT Goals Patient Stated Goal: to get back to doing things he was doing before OT Goal Formulation: With patient Time For Goal Achievement: 02/09/13 Potential to Achieve Goals: Good ADL Goals Pt Will Perform Grooming: with set-up;with supervision;sitting Pt Will Perform Upper Body Bathing: with set-up;with supervision;sitting Pt Will Perform Lower Body Bathing: with min assist;sitting/lateral leans Pt Will Perform Upper Body Dressing: with set-up;with supervision;sitting Pt Will Perform Lower  Body Dressing: with min assist;sitting/lateral leans Pt Will Transfer to Toilet: with min assist;stand pivot  transfer;bedside commode Pt Will Perform Toileting - Clothing Manipulation and hygiene: sitting/lateral leans;with min assist Additional ADL Goal #1: Pt will be independent in positioning RUE appropriately.  Visit Information  Last OT Received On: 02/07/13 Assistance Needed: +2 History of Present Illness: Bruce Guerrero is an 52 y.o. male. While at work he noted he had right leg weakness which hen progressed to right arm weakness by the end of the day. He did not seek medical attention at that time. He noted right facial droop along with previous symptoms. On consultation his right arm is flaccid (Per patient he was able to move his right arm on arrival to ED) with right leg paresis and right facial droop. HE states he does take his medication daily including his ASA.     Subjective Data      Prior Functioning       Cognition  Cognition Arousal/Alertness: Awake/alert Behavior During Therapy: WFL for tasks assessed/performed Overall Cognitive Status: Within Functional Limits for tasks assessed    Mobility  Bed Mobility Bed Mobility: Not assessed Transfers Transfers: Sit to Stand;Stand to Sit Sit to Stand: 4: Min assist;With upper extremity assist;From chair/3-in-1;3: Mod assist;From toilet Stand to Sit: 4: Min assist;To chair/3-in-1;To toilet Details for Transfer Assistance: Assist to control descent when sitting. +2 total A for stand pivot transfer (40%).    Exercises         End of Session OT - End of Session Equipment Utilized During Treatment: Other (comment);Gait belt (hemi walker) Activity Tolerance: Patient tolerated treatment well Patient left: in chair;with call bell/phone within reach;with chair alarm set  GO     Earlie Raveling OTR/L [phone] 02/07/2013, 3:49 PM

## 2013-02-07 NOTE — PMR Pre-admission (Signed)
PMR Admission Coordinator Pre-Admission Assessment   Patient: Bruce Guerrero is an 52 y.o., male MRN: [id number] DOB: Aug 20, 1960 Height: 5\' 4"  (162.6 cm) Weight: 81.2 kg (179 lb 0.2 oz)              Insurance Information HMO:     PPO: yes     PCP:      IPA:      80/20:      OTHER:  PRIMARY: [employer] [place]      Policy#: [id number]      Subscriber: pt CM Name: Shela Nevin    Phone#: [phone]     Fax#: [phone] use the rehab tool provided to fax information update in 7 days 12/2 - 10/2 Pre-Cert#: [id number]      Employer: [employer] Benefits:  Phone #: [phone]     Name: 12/1 Eff. Date: 03/10/12     Deduct: $2000      Out of Pocket Max: $2500      Life Max: none CIR: $500 copay per admit then 80%      SNF: $50 copay per admit then 80% Outpatient: 80%     Co-Pay: 30 visits combined Home Health: 80%      Co-Pay: 60 visits DME: 80%     Co-Pay: 20% Providers: in network  SECONDARY: none  Medicaid Application Date:       Case Manager:  Disability Application Date:       Case Worker:   Emergency Conservator, museum/gallery Information   Name Relation Home Work Mobile   [place] Brother   [phone]   [place], [place] [phone]  [phone]     Current Medical History  Patient Admitting Diagnosis: Left subcortical white matter infarct with right flaccid hemiplegia and right him he sensory deficits  History of Present Illness: Bruce Guerrero is a 52 y.o. right-handed male with history of hypertension. Patient was independent prior to admission working full time. Admitted 02/01/2013 with right-sided weakness and slurred speech. MRI of the brain showed a moderately enlarged deep white matter infarction on the left. MRA of the head with no large vessel occlusion identified. Carotid Dopplers with no ICA stenosis. Echocardiogram pending. Patient did not receive TPA. Neurology services consulted with workup ongoing presently maintained on aspirin for CVA  prophylaxis with the addition of subcutaneous heparin for DVT prophylaxis. He is tolerating a regular diet. Physical therapy evaluation completed 02/02/2013 with recommendations for physical medicine rehabilitation consult to consider inpatient rehabilitation services.  HTN- added Norvasc and Coreg. Added statin for hyperlipidemia.CKD stage 1 stable.  Total: 11  NIH     Past Medical History  Past Medical History  Diagnosis Date  . Gout   . Hypertension     Family History  family history includes COPD in his father and mother; Cancer in his sister; Diabetes in his brother; Hyperlipidemia in his brother.  Prior Rehab/Hospitalizations: none   Current Medications  Current facility-administered medications:albuterol (PROVENTIL) (5 MG/ML) 0.5% nebulizer solution 2.5 mg, 2.5 mg, Nebulization, Q2H PRN, Leroy Sea, MD;  amLODipine (NORVASC) tablet 10 mg, 10 mg, Oral, Daily, Rhetta Mura, MD, 10 mg at 02/08/13 1159;  atorvastatin (LIPITOR) tablet 80 mg, 80 mg, Oral, q1800, Rhetta Mura, MD, 80 mg at 02/07/13 1707 carvedilol (COREG) tablet 12.5 mg, 12.5 mg, Oral, BID WC, Rhetta Mura, MD, 12.5 mg at 02/08/13 0846;  clopidogrel (PLAVIX) tablet 75 mg, 75 mg, Oral, Q breakfast, Cathlyn Parsons, PA-C, 75 mg at 02/08/13 0846;  colchicine tablet 0.6 mg, 0.6  mg, Oral, Daily, Acey Lav, MD, 0.6 mg at 02/08/13 1200;  guaiFENesin-dextromethorphan (ROBITUSSIN DM) 100-10 MG/5ML syrup 5 mL, 5 mL, Oral, Q4H PRN, Leroy Sea, MD heparin injection 5,000 Units, 5,000 Units, Subcutaneous, Q8H, Leroy Sea, MD, 5,000 Units at 02/08/13 0901;  hydrALAZINE (APRESOLINE) injection 10 mg, 10 mg, Intravenous, Q6H PRN, Leroy Sea, MD, 10 mg at 02/01/13 1554;  HYDROcodone-acetaminophen (NORCO/VICODIN) 5-325 MG per tablet 1-2 tablet, 1-2 tablet, Oral, Q4H PRN, Leroy Sea, MD, 2 tablet at 02/07/13 2132 LORazepam (ATIVAN) injection 1 mg, 1 mg, Intravenous, Once, Leroy Sea, MD;   LORazepam (ATIVAN) tablet 0.5 mg, 0.5 mg, Oral, Q6H PRN, Rhetta Mura, MD, 0.5 mg at 02/07/13 2132;  ondansetron (ZOFRAN) injection 4 mg, 4 mg, Intravenous, Q6H PRN, Leroy Sea, MD;  ondansetron (ZOFRAN) tablet 4 mg, 4 mg, Oral, Q6H PRN, Leroy Sea, MD polyethylene glycol (MIRALAX / GLYCOLAX) packet 17 g, 17 g, Oral, Daily PRN, Leroy Sea, MD  Patients Current Diet: Cardiac  Precautions / Restrictions Precautions Precautions: Fall Restrictions Weight Bearing Restrictions: No   Prior Activity Level Community (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 [employer] / Equipment Home Assistive Devices/Equipment: None Home Equipment: None  Prior Functional Level Prior Function Level of Independence: Independent  Current Functional Level Cognition  Overall Cognitive Status: Within Functional Limits for tasks assessed Orientation Level: Oriented X4    Extremity Assessment (includes Sensation/Coordination)          ADLs  Eating/Feeding: Minimal assistance Where Assessed - Eating/Feeding: Chair Grooming: Wash/dry face;Teeth care;Brushing hair;Moderate assistance (OT performed hand over hand assistance as well) Where Assessed - Grooming: Supported sitting;Supported standing Upper Body Bathing: Minimal assistance Where Assessed - Upper Body Bathing: Supported sitting Lower Body Bathing: Maximal assistance Where Assessed - Lower Body Bathing: Lean right and/or left Upper Body Dressing: Minimal assistance Where Assessed - Upper Body Dressing: Supported sitting Lower Body Dressing: Minimal assistance (donned/doffed socks and pants) Where Assessed - Lower Body Dressing: Supported sit to Art therapist Transfer: +2 Total assistance;Moderate assistance Toilet Transfer: Patient Percentage: 40% Toilet Transfer Method: Sit to stand;Stand pivot (Mod A-sit to stand and +2 Total A Stand pivot) Toilet Transfer Equipment: Comfort height toilet Toileting -  Clothing Manipulation and Hygiene: +2 Total assistance Toileting - Clothing Manipulation and Hygiene: Patient Percentage: 40% Where Assessed - Toileting Clothing Manipulation and Hygiene: Sit to stand from 3-in-1 or toilet Tub/Shower Transfer Method: Not assessed Equipment Used: Gait belt;Other (comment) (hemi walker) Transfers/Ambulation Related to ADLs: +2 total A for stand pivot transfer (40%) and ambulation (50%). Mod A for sit to stand from commode and Min A from recliner chair. Min A for stand to sit transfer. ADL Comments: Educated and performed retrograde massage. Educated to use Lt hand to gently move Rt digits. Educated that it is beneficial to bear weight through RUE. OT assisted in positioning Rt arm on armrest for sit to stand transfer from chair. Pt practiced LB and UB dressing. Educated on dressing technique.  OT provided hand over hand assistance with grooming tasks and explained family can do this too. Pt stood at sink and brushed teeth.     Mobility  Bed Mobility: Not assessed    Transfers  Transfers: Sit to Stand;Stand to Sit Sit to Stand: 4: Min assist;With upper extremity assist;From chair/3-in-1;3: Mod assist;From toilet Sit to Stand: Patient Percentage: 50% Stand to Sit: 4: Min assist;To chair/3-in-1;To toilet Stand to Sit: Patient Percentage: 50%    Ambulation / Gait /  Stairs / Psychologist, prison and probation services  Ambulation/Gait Ambulation/Gait Assistance: 1: +2 Total assist Ambulation/Gait: Patient Percentage: 40% Ambulation Distance (Feet): 8 Feet Assistive device: Hemi-walker Ambulation/Gait Assistance Details: Trialed use of hemi-walker in L UE to allowpt to use L UE to A with support during mobility.  Step-by-step cues for safe technique and A with wtshifting, blocking R knee, and occasional movement of R LE.   Gait Pattern: Decreased step length - left;Decreased stance time - right Stairs: No Wheelchair Mobility Wheelchair Mobility: No    Posture / Balance Dynamic  Standing Balance Dynamic Standing - Balance Support: During functional activity Dynamic Standing - Level of Assistance: 2: Max assist Dynamic Standing - Balance Activities: Reaching across midline;Reaching for objects Dynamic Standing - Comments: block right knee and assist trunk during activities    Special needs/care consideration Bowel mgmt: continent Bladder mgmt: contient    Previous Home Environment Living Arrangements: Children  Lives With: Son Available Help at Discharge: Family Type of Home: House Home Layout: Two level;Able to live on main level with bedroom/bathroom Alternate Level Stairs-Rails: Can reach both Alternate Level Stairs-Number of Steps: 8 Home Access: Stairs to enter Entrance Stairs-Number of Steps: 2 Home Care Services: No  Discharge Living Setting Plans for Discharge Living Setting: Patient's home;Lives with (comment) (13 yo son) Type of Home at Discharge: House Discharge Home Layout: Two level;Able to live on main level with bedroom/bathroom Alternate Level Stairs-Rails: Can reach both Alternate Level Stairs-Number of Steps: 8 steps Discharge Home Access: Stairs to enter Entrance Stairs-Number of Steps: 2 steps Discharge Bathroom Shower/Tub: Tub/shower unit Discharge Bathroom Toilet: Standard Discharge Bathroom Accessibility: Yes How Accessible: Accessible via walker Does the patient have any problems obtaining your medications?: No  Social/Family/Support Systems Patient Roles: Parent;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[employer]) Contact Information: Mable [place], son and Brother, Marcial Pacas Anticipated Caregiver: son Anticipated Caregiver's Contact Information: see above Ability/Limitations of Caregiver: son unemployed Engineer, structural Availability: 24/7 Discharge Plan Discussed with Primary Caregiver: Yes Is Caregiver In Agreement with Plan?: Yes Does Caregiver/Family have Issues with Lodging/Transportation while Pt is in Rehab?: No    Goals/Additional  Needs Patient/Family Goal for Rehab: min assist with PT and OT Expected length of stay: ELOS 3 weeks Pt/Family Agrees to Admission and willing to participate: Yes Program Orientation Provided & Reviewed with Pt/Caregiver Including Roles  & Responsibilities: Yes   Decrease burden of Care through IP rehab admission: n/a  Possible need for SNF placement upon discharge:not anticipated  Patient Condition: This patient's medical and functional status has changed since the consult dated: 02/02/13 in which the Rehabilitation Physician determined and documented that the patient's condition is appropriate for intensive rehabilitative care in an inpatient rehabilitation facility. See "History of Present Illness" (above) for medical update. Functional changes are: overall mod to max assist. Patient's medical and functional status update has been discussed with the Rehabilitation physician and patient remains appropriate for inpatient rehabilitation. Will admit to inpatient rehab today.  Preadmission Screen Completed By:  Clois Dupes, 02/08/2013 12:20 PM ______________________________________________________________________   Discussed status with Dr. Riley Kill on 02/08/13 at  1220 and received telephone approval for admission today.  Admission Coordinator:  Clois Dupes, time 1610 Date 02/08/2013.

## 2013-02-07 NOTE — Progress Notes (Signed)
Speech Language Pathology Treatment: Cognitive-Linquistic  Patient Details Name: Bruce Guerrero MRN: [id number] DOB: Dec 30, 1960 Today's Date: 02/07/2013 Time: 7829-5621 SLP Time Calculation (min): 17 min  Assessment / Plan / Recommendation Clinical Impression  Skilled treatment session focused on addressing dysarthria goals and patient education.  SLP facilitated session with verbal education and handout regarding effective speech intelligibly compensatory strategies.  Patient overall Supervision with intelligibility throughout session; right sided labial weakness appeared to be most limiting factor and as a result SLP demonstrated and patient return demonstrated oral motor exercises to address this.  Patient performed exercise more accurately with mirror present.  Recommend acute follow up x1 and then all other skilled intervention can be deferred to next level of care.        HPI HPI: 52 y.o. male with new onset right face, arm and leg weakness;  MRI revealed acute infarct in the deep white matter on the left adjacent to the ventricle. This measures 15 x 23 mm and is a moderately large white matter infarct.    Pertinent Vitals none  SLP Plan  Continue with current plan of care    Recommendations                Follow up Recommendations: Inpatient Rehab Plan: Continue with current plan of care    GO    Charlane Ferretti., CCC-SLP [phone]  Bruce Guerrero 02/07/2013, 4:44 PM

## 2013-02-07 NOTE — Discharge Summary (Signed)
Physician Discharge Summary  Bruce Guerrero ZOX:[id number] DOB: 08/19/1960 DOA: 02/01/2013  PCP: Rudi Heap, MD  Admit date: 02/01/2013 Discharge date: 02/07/2013  Time spent: 20 minutes  Recommendations for Outpatient Follow-up:  1. Needs Plavix 75 mg daily for secondary prevention 2. Will benefit probably from higher level of care including rehabilitation and aggressive rehabilitation 3. Might benefit from psychiatry visits and antidepressant  Discharge Diagnoses:  Principal Problem:   CVA (cerebral infarction) Active Problems:   Gout   HTN (hypertension)   Discharge Condition: Stable  Diet recommendation: Heart healthy 2 g salt  Filed Weights   02/01/13 1500 02/07/13 0500  Weight: 87.045 kg (191 lb 14.4 oz) 81.2 kg (179 lb 0.2 oz)    History of present illness:  52 yr old ?, no specific prior history other than hyperlipidemia hypertension admitted 02/01/13 with facial droop, slurred speech, right-sided weakness and had absolute flaccid paralysis right-sided facial droop dysarthria and expressive face he. Clinically had acute CVA and workup ensued. TPA not given because of time interval that had passed since symptoms started  Hospital Course:   1. Deep white matter infarct left side-changed on this admission to Plavix 75 daily for secondary prevention. Dopplers 1-39% bilaterally, echocardiogram nonspecific for PFO.  Will need extensive therapy 2. Hypertension-add Norvasc 10 mg 11/26, added Coreg low dose 6.25-increased on 11/20.14 3. Cramps-unclear etiology. Monitor seems to have resolved 4. Hyperlipidemia-add high intensity statin atorvastatin 80 mg 5. CKD stage I -currently stable  6. Gout-continue chronic colchicine   Discharge Exam: Filed Vitals:   02/07/13 1003  BP: 144/74  Pulse: 63  Temp: 97.3 F (36.3 C)  Resp: 18   Alert pleasant oriented no apparent distress General: EOMI, NCAT Cardiovascular: S1-S2 no murmur or gallop Respiratory: Clinically  clear Dense right-sided hemiparesis with mild right facial droop  Discharge Instructions  Discharge Orders   Future Appointments Provider Department Dept Phone   02/25/2013 2:15 PM Mary-Margaret Daphine Deutscher, FNP [facility] [phone]   03/07/2013 3:50 PM Coralie Keens, FNP [facility] [phone]   Future Orders Complete By Expires   Diet - low sodium heart healthy  As directed    Diet - low sodium heart healthy  As directed    Discharge instructions  As directed    Comments:     Will need skilled nursing home vs. inpatient rehabilitation stay Continue Plavix lifelong   Increase activity slowly  As directed    Increase activity slowly  As directed        Medication List    STOP taking these medications       aspirin 325 MG tablet      TAKE these medications       amLODipine 10 MG tablet  Commonly known as:  NORVASC  Take 10 mg by mouth daily.     amLODipine 10 MG tablet  Commonly known as:  NORVASC  Take 1 tablet (10 mg total) by mouth daily.     atorvastatin 80 MG tablet  Commonly known as:  LIPITOR  Take 1 tablet (80 mg total) by mouth daily at 6 PM.     carvedilol 12.5 MG tablet  Commonly known as:  COREG  Take 1 tablet (12.5 mg total) by mouth 2 (two) times daily with a meal.     clopidogrel 75 MG tablet  Commonly known as:  PLAVIX  Take 1 tablet (75 mg total) by mouth daily with breakfast.     colchicine 0.6 MG tablet  Take 0.6 mg by mouth daily.       No Known Allergies    The results of significant diagnostics from this hospitalization (including imaging, microbiology, ancillary and laboratory) are listed below for reference.    Significant Diagnostic Studies: Ct Head Wo Contrast  02/01/2013   CLINICAL DATA:  Right-sided weakness  EXAM: CT HEAD WITHOUT CONTRAST  TECHNIQUE: Contiguous axial images were obtained from the base of the skull through the vertex without intravenous contrast.  COMPARISON:   MRI of the brain dated September 20, 2008.  FINDINGS: The ventricles are normal in size and position. There is no intracranial hemorrhage nor intracranial mass effect. There is no evidence of an evolving ischemic infarction. The cerebellum and brainstem are normal in density. There are no abnormal intracranial calcifications.  At bone window settings the observed portions of the paranasal sinuses and mastoid air cells are clear. There is no evidence of an acute skull fracture.  IMPRESSION: 1. There is no evidence of an acute ischemic or hemorrhagic infarction. 2. There is no intracranial mass effect nor hydrocephalus. 3. If there is strong clinical concerns of acute ischemic processes, MRI would be useful if the patient can tolerate the procedure.   Electronically Signed   By: David  [place]   On: 02/01/2013 08:59   Mr Maxine Glenn Head Wo Contrast  02/01/2013   CLINICAL DATA:  Stroke.  Right-sided weakness.  EXAM: MRI HEAD WITHOUT AND WITH CONTRAST  MRA HEAD WITHOUT CONTRAST  MRA NECK WITHOUT AND WITH CONTRAST  TECHNIQUE: Multiplanar, multiecho pulse sequences of the brain and surrounding structures were obtained without and with intravenous contrast. Angiographic images of the Circle of Willis were obtained using MRA technique without intravenous contrast. Angiographic images of the neck were obtained using MRA technique without and with intravenous contrast. Carotid stenosis measurements (when applicable) are obtained utilizing NASCET criteria, using the distal internal carotid diameter as the denominator.  CONTRAST:  18mL MULTIHANCE GADOBENATE DIMEGLUMINE 529 MG/ML IV SOLN  COMPARISON:  CT head 02/01/2013  FINDINGS: MRI HEAD FINDINGS  Image quality degraded by motion. The patient was claustrophobic. Postcontrast images are especially degraded by motion.  Acute infarct in the deep white matter on the left adjacent to the ventricle. This measures 15 x 23 mm and is a moderately large white matter infarct. No other acute  infarct.  Ventricle size is normal. No midline shift. Mild chronic microvascular ischemic change in the white matter. No hemorrhage or mass lesion. No enhancing lesions are seen postcontrast.  MRA HEAD FINDINGS  Image quality degraded by significant motion. This is a limited examination.  Distal left vertebral artery is patent to the basilar. Distal right vertebral artery does not contribute to the basilar and may end in AICA. The basilar is patent. Posterior cerebral arteries are patent bilaterally. Cerebellar arteries are not adequately evaluated due to motion.  The internal carotid artery is patent bilaterally. Anterior and middle cerebral arteries are patent bilaterally. I cannot assess for intracranial stenosis given the amount of artifact from motion.  MRA NECK FINDINGS  Right carotid artery is widely patent. Right carotid bifurcation is normal.  Left carotid artery is widely patent without significant stenosis.  There is loss of flow related signal in the proximal vertebral artery bilaterally which may be due to severe stenosis versus tortuosity. Left vertebral artery is dominant and patent to the basilar. Right vertebral artery is smaller and ends in PICA.  IMPRESSION: Moderately large deep white matter infarct on the left.  MRA is  significantly degraded by motion. No large vessel occlusion is identified.  No significant carotid artery stenosis  Severe stenosis versus and tortuosity at the origin of the vertebral artery bilaterally. Left vertebral artery is dominant.   Electronically Signed   By: Marlan Palau M.D.   On: 02/01/2013 20:10   Mr Angiogram Neck W Wo Contrast  02/01/2013   CLINICAL DATA:  Stroke.  Right-sided weakness.  EXAM: MRI HEAD WITHOUT AND WITH CONTRAST  MRA HEAD WITHOUT CONTRAST  MRA NECK WITHOUT AND WITH CONTRAST  TECHNIQUE: Multiplanar, multiecho pulse sequences of the brain and surrounding structures were obtained without and with intravenous contrast. Angiographic images of the  Circle of Willis were obtained using MRA technique without intravenous contrast. Angiographic images of the neck were obtained using MRA technique without and with intravenous contrast. Carotid stenosis measurements (when applicable) are obtained utilizing NASCET criteria, using the distal internal carotid diameter as the denominator.  CONTRAST:  18mL MULTIHANCE GADOBENATE DIMEGLUMINE 529 MG/ML IV SOLN  COMPARISON:  CT head 02/01/2013  FINDINGS: MRI HEAD FINDINGS  Image quality degraded by motion. The patient was claustrophobic. Postcontrast images are especially degraded by motion.  Acute infarct in the deep white matter on the left adjacent to the ventricle. This measures 15 x 23 mm and is a moderately large white matter infarct. No other acute infarct.  Ventricle size is normal. No midline shift. Mild chronic microvascular ischemic change in the white matter. No hemorrhage or mass lesion. No enhancing lesions are seen postcontrast.  MRA HEAD FINDINGS  Image quality degraded by significant motion. This is a limited examination.  Distal left vertebral artery is patent to the basilar. Distal right vertebral artery does not contribute to the basilar and may end in AICA. The basilar is patent. Posterior cerebral arteries are patent bilaterally. Cerebellar arteries are not adequately evaluated due to motion.  The internal carotid artery is patent bilaterally. Anterior and middle cerebral arteries are patent bilaterally. I cannot assess for intracranial stenosis given the amount of artifact from motion.  MRA NECK FINDINGS  Right carotid artery is widely patent. Right carotid bifurcation is normal.  Left carotid artery is widely patent without significant stenosis.  There is loss of flow related signal in the proximal vertebral artery bilaterally which may be due to severe stenosis versus tortuosity. Left vertebral artery is dominant and patent to the basilar. Right vertebral artery is smaller and ends in PICA.   IMPRESSION: Moderately large deep white matter infarct on the left.  MRA is significantly degraded by motion. No large vessel occlusion is identified.  No significant carotid artery stenosis  Severe stenosis versus and tortuosity at the origin of the vertebral artery bilaterally. Left vertebral artery is dominant.   Electronically Signed   By: Marlan Palau M.D.   On: 02/01/2013 20:10   Mr Laqueta Jean ZO Contrast  02/01/2013   CLINICAL DATA:  Stroke.  Right-sided weakness.  EXAM: MRI HEAD WITHOUT AND WITH CONTRAST  MRA HEAD WITHOUT CONTRAST  MRA NECK WITHOUT AND WITH CONTRAST  TECHNIQUE: Multiplanar, multiecho pulse sequences of the brain and surrounding structures were obtained without and with intravenous contrast. Angiographic images of the Circle of Willis were obtained using MRA technique without intravenous contrast. Angiographic images of the neck were obtained using MRA technique without and with intravenous contrast. Carotid stenosis measurements (when applicable) are obtained utilizing NASCET criteria, using the distal internal carotid diameter as the denominator.  CONTRAST:  18mL MULTIHANCE GADOBENATE DIMEGLUMINE 529 MG/ML IV SOLN  COMPARISON:  CT head 02/01/2013  FINDINGS: MRI HEAD FINDINGS  Image quality degraded by motion. The patient was claustrophobic. Postcontrast images are especially degraded by motion.  Acute infarct in the deep white matter on the left adjacent to the ventricle. This measures 15 x 23 mm and is a moderately large white matter infarct. No other acute infarct.  Ventricle size is normal. No midline shift. Mild chronic microvascular ischemic change in the white matter. No hemorrhage or mass lesion. No enhancing lesions are seen postcontrast.  MRA HEAD FINDINGS  Image quality degraded by significant motion. This is a limited examination.  Distal left vertebral artery is patent to the basilar. Distal right vertebral artery does not contribute to the basilar and may end in AICA. The  basilar is patent. Posterior cerebral arteries are patent bilaterally. Cerebellar arteries are not adequately evaluated due to motion.  The internal carotid artery is patent bilaterally. Anterior and middle cerebral arteries are patent bilaterally. I cannot assess for intracranial stenosis given the amount of artifact from motion.  MRA NECK FINDINGS  Right carotid artery is widely patent. Right carotid bifurcation is normal.  Left carotid artery is widely patent without significant stenosis.  There is loss of flow related signal in the proximal vertebral artery bilaterally which may be due to severe stenosis versus tortuosity. Left vertebral artery is dominant and patent to the basilar. Right vertebral artery is smaller and ends in PICA.  IMPRESSION: Moderately large deep white matter infarct on the left.  MRA is significantly degraded by motion. No large vessel occlusion is identified.  No significant carotid artery stenosis  Severe stenosis versus and tortuosity at the origin of the vertebral artery bilaterally. Left vertebral artery is dominant.   Electronically Signed   By: Marlan Palau M.D.   On: 02/01/2013 20:10   Dg Chest Port 1 View  02/03/2013   CLINICAL DATA:  New onset wheezing and cough.  EXAM: PORTABLE CHEST - 1 VIEW  COMPARISON:  None.  FINDINGS: The heart size and mediastinal contours are within normal limits. Both lungs are clear. The visualized skeletal structures are unremarkable.  IMPRESSION: No active disease.   Electronically Signed   By: Burman Nieves M.D.   On: 02/03/2013 06:36    Microbiology: No results found for this or any previous visit (from the past 240 hour(s)).   Labs: Basic Metabolic Panel:  Recent Labs Lab 02/01/13 0836 02/01/13 0911 02/02/13 0356 02/05/13 0632  NA 139 141 136 136  K 3.7 3.8 3.4* 3.9  CL 100 102 98 95*  CO2 25  --  24 28  GLUCOSE 96 98 99 109*  BUN 15 14 14  34*  CREATININE 1.13 1.30 1.01 1.60*  CALCIUM 9.8  --  9.7 10.1   Liver  Function Tests:  Recent Labs Lab 02/01/13 0836  AST 17  ALT 20  ALKPHOS 88  BILITOT 0.5  PROT 7.5  ALBUMIN 4.4   No results found for this basename: LIPASE, AMYLASE,  in the last 168 hours No results found for this basename: AMMONIA,  in the last 168 hours CBC:  Recent Labs Lab 02/01/13 0836 02/01/13 0911 02/02/13 0356 02/04/13 0333  WBC 8.8  --  11.5* 13.1*  NEUTROABS 5.9  --   --  10.3*  HGB 17.3* 17.0 17.3* 16.1  HCT 48.5 50.0 48.4 45.2  MCV 91.7  --  94.2 92.4  PLT 187  --  178 167   Cardiac Enzymes:  Recent Labs Lab 02/01/13 0836  02/01/13 1145 02/01/13 1629 02/01/13 2255  TROPONINI <0.30 <0.30 <0.30 <0.30   BNP: BNP (last 3 results) No results found for this basename: PROBNP,  in the last 8760 hours CBG:  Recent Labs Lab 02/01/13 0859  GLUCAP 97       Signed:  Rhetta Mura  [facility] 02/07/2013, 11:33 AM

## 2013-02-07 NOTE — Progress Notes (Signed)
Physical Therapy Treatment Patient Details Name: Bruce Guerrero MRN: [id number] DOB: 1960/12/02 Today's Date: 02/07/2013 Time: 4098-1191 PT Time Calculation (min): 13 min  PT Assessment / Plan / Recommendation  History of Present Illness Bruce Guerrero is an 52 y.o. male. While at work he noted he had right leg weakness which hen progressed to right arm weakness by the end of the day. He did not seek medical attention at that time. He noted right facial droop along with previous symptoms. On consultation his right arm is flaccid (Per patient he was able to move his right arm on arrival to ED) with right leg paresis and right facial droop. HE states he does take his medication daily including his ASA.    PT Comments   Pt motivated and making progress.  Pt able to use hemi-walker today to A with balance and support during ambulation.  OT arrived after ambulating and pt left with OT.  Will continue to follow.    Follow Up Recommendations  CIR     Does the patient have the potential to tolerate intense rehabilitation     Barriers to Discharge        Equipment Recommendations  None recommended by PT    Recommendations for Other Services    Frequency Min 4X/week   Progress towards PT Goals Progress towards PT goals: Progressing toward goals  Plan Current plan remains appropriate    Precautions / Restrictions Precautions Precautions: Fall Restrictions Weight Bearing Restrictions: No   Pertinent Vitals/Pain Denied pain.      Mobility  Bed Mobility Bed Mobility: Not assessed Transfers Transfers: Sit to Stand;Stand to Sit Sit to Stand: 4: Min assist;With upper extremity assist;From chair/3-in-1;3: Mod assist;From toilet Stand to Sit: 4: Min assist;To chair/3-in-1;To toilet Details for Transfer Assistance: Assist to control descent when sitting. +2 total A for stand pivot transfer (40%). Ambulation/Gait Ambulation/Gait Assistance: 1: +2 Total assist Ambulation/Gait: Patient  Percentage: 40% Ambulation Distance (Feet): 8 Feet Assistive device: Hemi-walker Ambulation/Gait Assistance Details: Trialed use of hemi-walker in L UE to allowpt to use L UE to A with support during mobility.  Step-by-step cues for safe technique and A with wtshifting, blocking R knee, and occasional movement of R LE.   Gait Pattern: Decreased step length - left;Decreased stance time - right Stairs: No Wheelchair Mobility Wheelchair Mobility: No Modified Rankin (Stroke Patients Only) Pre-Morbid Rankin Score: No symptoms Modified Rankin: Severe disability    Exercises     PT Diagnosis:    PT Problem List:   PT Treatment Interventions:     PT Goals (current goals can now be found in the care plan section) Acute Rehab PT Goals Time For Goal Achievement: 02/16/13 Potential to Achieve Goals: Good  Visit Information  Last PT Received On: 02/07/13 Assistance Needed: +2 History of Present Illness: Bruce Guerrero is an 52 y.o. male. While at work he noted he had right leg weakness which hen progressed to right arm weakness by the end of the day. He did not seek medical attention at that time. He noted right facial droop along with previous symptoms. On consultation his right arm is flaccid (Per patient he was able to move his right arm on arrival to ED) with right leg paresis and right facial droop. HE states he does take his medication daily including his ASA.     Subjective Data  Subjective: I'm bored just sitting here all day.     Cognition  Cognition Arousal/Alertness: Awake/alert Behavior During Therapy:  WFL for tasks assessed/performed Overall Cognitive Status: Within Functional Limits for tasks assessed    Balance  Balance Balance Assessed: No  End of Session PT - End of Session Equipment Utilized During Treatment: Gait belt Activity Tolerance: Patient tolerated treatment well Patient left: in chair (with OT) Nurse Communication: Mobility status   GP     Sunny Schlein, [place] [phone] 02/07/2013, 3:00 PM

## 2013-02-07 NOTE — Progress Notes (Signed)
I met with pt at bedside. Discussed inpt rehab venue as an option for his recovery. I will seek insurance approval with [employer] to admit pt to inpt rehab. [phone]

## 2013-02-08 ENCOUNTER — Inpatient Hospital Stay (HOSPITAL_COMMUNITY)
Admission: RE | Admit: 2013-02-08 | Discharge: 2013-02-26 | DRG: 945 | Disposition: A | Discharge status: Home / Self Care | Payer: BC Managed Care – PPO | Source: Intra-hospital | Attending: Physical Medicine & Rehabilitation | Admitting: Physical Medicine & Rehabilitation

## 2013-02-08 ENCOUNTER — Encounter (HOSPITAL_COMMUNITY): Payer: Self-pay | Admitting: *Deleted

## 2013-02-08 DIAGNOSIS — E785 Hyperlipidemia, unspecified: Secondary | ICD-10-CM | POA: Diagnosis present

## 2013-02-08 DIAGNOSIS — I6330 Cerebral infarction due to thrombosis of unspecified cerebral artery: Secondary | ICD-10-CM | POA: Diagnosis present

## 2013-02-08 DIAGNOSIS — Z5189 Encounter for other specified aftercare: Principal | ICD-10-CM

## 2013-02-08 DIAGNOSIS — M109 Gout, unspecified: Secondary | ICD-10-CM

## 2013-02-08 DIAGNOSIS — G8190 Hemiplegia, unspecified affecting unspecified side: Secondary | ICD-10-CM | POA: Diagnosis present

## 2013-02-08 DIAGNOSIS — I498 Other specified cardiac arrhythmias: Secondary | ICD-10-CM | POA: Diagnosis not present

## 2013-02-08 DIAGNOSIS — I10 Essential (primary) hypertension: Secondary | ICD-10-CM

## 2013-02-08 DIAGNOSIS — M25561 Pain in right knee: Secondary | ICD-10-CM | POA: Diagnosis present

## 2013-02-08 DIAGNOSIS — T448X5A Adverse effect of centrally-acting and adrenergic-neuron-blocking agents, initial encounter: Secondary | ICD-10-CM | POA: Diagnosis not present

## 2013-02-08 DIAGNOSIS — F411 Generalized anxiety disorder: Secondary | ICD-10-CM | POA: Diagnosis present

## 2013-02-08 DIAGNOSIS — I639 Cerebral infarction, unspecified: Secondary | ICD-10-CM | POA: Diagnosis present

## 2013-02-08 DIAGNOSIS — Y9210 Unspecified residential institution as the place of occurrence of the external cause: Secondary | ICD-10-CM | POA: Diagnosis not present

## 2013-02-08 LAB — CBC
HCT: 46.7 % (ref 39.0–52.0)
Hemoglobin: 16.5 g/dL (ref 13.0–17.0)
MCHC: 35.3 g/dL (ref 30.0–36.0)
Platelets: 230 10*3/uL (ref 150–400)
RBC: 4.94 MIL/uL (ref 4.22–5.81)
WBC: 8.6 10*3/uL (ref 4.0–10.5)

## 2013-02-08 LAB — CREATININE, SERUM
Creatinine, Ser: 1.33 mg/dL (ref 0.50–1.35)
GFR calc Af Amer: 70 mL/min — ABNORMAL LOW (ref >90)
GFR calc non Af Amer: 60 mL/min — ABNORMAL LOW (ref >90)

## 2013-02-08 MED ORDER — HYDROCODONE-ACETAMINOPHEN 5-325 MG PO TABS
1.0000 | ORAL_TABLET | ORAL | Status: DC | PRN
  Administered 2013-02-13 – 2013-02-15 (×3): 2 via ORAL
  Administered 2013-02-17: 1 via ORAL
  Filled 2013-02-08: qty 1
  Filled 2013-02-08 (×2): qty 2
  Filled 2013-02-08: qty 1

## 2013-02-08 MED ORDER — LORAZEPAM 0.5 MG PO TABS
0.5000 mg | ORAL_TABLET | Freq: Four times a day (QID) | ORAL | Status: DC | PRN
  Administered 2013-02-08 – 2013-02-24 (×24): 0.5 mg via ORAL
  Filled 2013-02-08 (×24): qty 1

## 2013-02-08 MED ORDER — ACETAMINOPHEN 325 MG PO TABS
325.0000 mg | ORAL_TABLET | ORAL | Status: DC | PRN
  Administered 2013-02-14 – 2013-02-17 (×2): 650 mg via ORAL
  Filled 2013-02-08 (×2): qty 2

## 2013-02-08 MED ORDER — ATORVASTATIN CALCIUM 80 MG PO TABS
80.0000 mg | ORAL_TABLET | Freq: Every day | ORAL | Status: DC
  Administered 2013-02-08 – 2013-02-25 (×18): 80 mg via ORAL
  Filled 2013-02-08 (×19): qty 1

## 2013-02-08 MED ORDER — ONDANSETRON HCL 4 MG PO TABS: 4.0000 mg | ORAL_TABLET | Freq: Four times a day (QID) | ORAL | Status: DC | PRN

## 2013-02-08 MED ORDER — CLOPIDOGREL BISULFATE 75 MG PO TABS
75.0000 mg | ORAL_TABLET | Freq: Every day | ORAL | Status: DC
  Administered 2013-02-09 – 2013-02-26 (×18): 75 mg via ORAL
  Filled 2013-02-08 (×19): qty 1

## 2013-02-08 MED ORDER — COLCHICINE 0.6 MG PO TABS
0.6000 mg | ORAL_TABLET | Freq: Every day | ORAL | Status: DC
  Administered 2013-02-09 – 2013-02-26 (×18): 0.6 mg via ORAL
  Filled 2013-02-08 (×19): qty 1

## 2013-02-08 MED ORDER — ALBUTEROL SULFATE (5 MG/ML) 0.5% IN NEBU
2.5000 mg | INHALATION_SOLUTION | RESPIRATORY_TRACT | Status: DC | PRN
  Filled 2013-02-08: qty 0.5

## 2013-02-08 MED ORDER — POLYETHYLENE GLYCOL 3350 17 G PO PACK
17.0000 g | PACK | Freq: Every day | ORAL | Status: DC | PRN
  Filled 2013-02-08: qty 1

## 2013-02-08 MED ORDER — HEPARIN SODIUM (PORCINE) 5000 UNIT/ML IJ SOLN
5000.0000 [IU] | Freq: Three times a day (TID) | INTRAMUSCULAR | Status: DC
  Administered 2013-02-08 – 2013-02-26 (×53): 5000 [IU] via SUBCUTANEOUS
  Filled 2013-02-08 (×56): qty 1

## 2013-02-08 MED ORDER — ONDANSETRON HCL 4 MG/2ML IJ SOLN: 4.0000 mg | Freq: Four times a day (QID) | INTRAMUSCULAR | Status: DC | PRN

## 2013-02-08 MED ORDER — AMLODIPINE BESYLATE 10 MG PO TABS
10.0000 mg | ORAL_TABLET | Freq: Every day | ORAL | Status: DC
  Administered 2013-02-09 – 2013-02-26 (×18): 10 mg via ORAL
  Filled 2013-02-08 (×20): qty 1

## 2013-02-08 MED ORDER — SORBITOL 70 % SOLN: 30.0000 mL | Freq: Every day | Status: DC | PRN

## 2013-02-08 MED ORDER — GUAIFENESIN-DM 100-10 MG/5ML PO SYRP
5.0000 mL | ORAL_SOLUTION | ORAL | Status: DC | PRN
  Administered 2013-02-11: 5 mL via ORAL
  Filled 2013-02-08: qty 5

## 2013-02-08 MED ORDER — CARVEDILOL 12.5 MG PO TABS
12.5000 mg | ORAL_TABLET | Freq: Two times a day (BID) | ORAL | Status: DC
  Administered 2013-02-08 – 2013-02-19 (×23): 12.5 mg via ORAL
  Filled 2013-02-08 (×26): qty 1

## 2013-02-08 MED ORDER — HEPARIN SODIUM (PORCINE) 5000 UNIT/ML IJ SOLN: 5000.0000 [IU] | Freq: Three times a day (TID) | INTRAMUSCULAR | Status: DC

## 2013-02-08 NOTE — Significant Event (Signed)
No acute changes Ready for d/c to CIR  Pleas Koch, MD [facility] [phone]

## 2013-02-08 NOTE — Progress Notes (Signed)
I have insurance approval and plan to admit pt to inpt rehab today. Patient is in agreement. [phone]

## 2013-02-08 NOTE — H&P (Signed)
Physical Medicine and Rehabilitation Admission H&P  Chief Complaint   Patient presents with   .  Weakness   :  Chief complaint: Right-sided weakness  HPI: Bruce Guerrero is a 52 y.o. right-handed male with history of hypertension. Patient was independent prior to admission working full time. Admitted 02/01/2013 with right-sided weakness and slurred speech. MRI of the brain showed a moderately enlarged deep white matter infarction on the left. MRA of the head with no large vessel occlusion identified. Carotid Dopplers with no ICA stenosis. Echocardiogram with ejection fraction of 60% grade 1 diastolic dysfunction. Patient did not receive TPA. Neurology services consulted presently maintained on Plavix for CVA prophylaxis with the addition of subcutaneous heparin for DVT prophylaxis. He is tolerating a regular diet. Physical and occupational therapy evaluations completed 02/02/2013 with recommendations for physical medicine rehabilitation consult to consider inpatient rehabilitation services. Patient was felt to be a good candidate for inpatient rehabilitation services and was admitted for comprehensive rehabilitation program    ROS Review of Systems  Musculoskeletal: Positive for joint pain and myalgias.  All other systems reviewed and are negative  Past Medical History   Diagnosis  Date   .  Gout    .  Hypertension     History reviewed. No pertinent past surgical history.  Family History   Problem  Relation  Age of Onset   .  COPD  Mother    .  COPD  Father    .  Cancer  Sister    .  Hyperlipidemia  Brother    .  Diabetes  Brother     Social History: reports that he has never smoked. He does not have any smokeless tobacco history on file. He reports that he drinks alcohol. He reports that he does not use illicit drugs.  Allergies: No Known Allergies  Medications Prior to Admission   Medication  Sig  Dispense  Refill   .  amLODipine (NORVASC) 10 MG tablet  Take 10 mg by mouth daily.      .  colchicine 0.6 MG tablet  Take 0.6 mg by mouth daily.     .  [DISCONTINUED] aspirin 325 MG tablet  Take 325 mg by mouth daily as needed for mild pain.      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[place])  Functional Status:  Mobility:  Bed Mobility  Bed Mobility: Not assessed  Transfers  Transfers: Sit to Stand;Stand to Sit  Sit to Stand: 4: Min assist;With upper extremity assist;From chair/3-in-1;3: Mod assist;From toilet  Sit to Stand: Patient Percentage: 50%  Stand to Sit: 4: Min assist;To chair/3-in-1;To toilet  Stand to Sit: Patient Percentage: 50%  Ambulation/Gait  Ambulation/Gait Assistance: 1: +2 Total assist  Ambulation/Gait: Patient Percentage: 40%  Ambulation Distance (Feet): 8 Feet  Assistive device: Hemi-walker  Ambulation/Gait Assistance Details: Trialed use of hemi-walker in L UE to allowpt to use L UE to A with support during mobility. Step-by-step cues for safe technique and A with wtshifting, blocking R knee, and  occasional movement of R LE.  Gait Pattern: Decreased step length - left;Decreased stance time - right  Stairs: No  Wheelchair Mobility  Wheelchair Mobility: No  ADL:  ADL  Eating/Feeding: Minimal assistance  Where Assessed - Eating/Feeding: Chair  Grooming: Wash/dry face;Teeth care;Brushing hair;Moderate assistance (OT performed hand over hand assistance as well)  Where Assessed - Grooming: Supported sitting;Supported standing  Upper Body Bathing: Minimal  assistance  Where Assessed - Upper Body Bathing: Supported sitting  Lower Body Bathing: Maximal assistance  Where Assessed - Lower Body Bathing: Lean right and/or left  Upper Body Dressing: Minimal assistance  Where Assessed - Upper Body Dressing: Supported sitting  Lower Body Dressing: Minimal assistance (donned/doffed socks and pants)  Where Assessed - Lower Body Dressing: Supported sit to Musician Transfer: +2 Total assistance;Moderate assistance  Toilet Transfer Method: Sit to stand;Stand pivot (Mod A-sit to stand and +2 Total A Stand pivot)  Toilet Transfer Equipment: Comfort height toilet  Tub/Shower Transfer Method: Not assessed  Equipment Used: Gait belt;Other (comment) (hemi walker)  Transfers/Ambulation Related to ADLs: +2 total A for stand pivot transfer (40%) and ambulation (50%). Mod A for sit to stand from commode and Min A from recliner chair. Min A for stand to sit transfer.  ADL Comments: Educated and performed retrograde massage. Educated to use Lt hand to gently move Rt digits. Educated that it is beneficial to bear weight through RUE. OT assisted in positioning Rt arm on armrest for sit to stand transfer from chair. Pt practiced LB and UB dressing. Educated on dressing technique. OT provided hand over hand assistance with grooming tasks and explained family can do this too. Pt stood at sink and brushed teeth.  Cognition:  Cognition  Overall Cognitive Status: Within Functional Limits for tasks assessed  Orientation Level: Oriented X4  Cognition  Arousal/Alertness: Awake/alert  Behavior During Therapy: WFL for tasks assessed/performed  Overall Cognitive Status: Within Functional Limits for tasks assessed    Physical Exam:  Blood pressure 138/87, pulse 60, temperature 98.3 F (36.8 C), temperature source Oral, resp. rate 20, height 5\' 4"  (1.626 m), weight 81.2 kg (179 lb 0.2 oz), SpO2 97.00%.  Constitutional: He appears well-developed. Sitting EOB HEENT: oral mucosa  pink and moist Eyes: EOM are normal.  Neck: Normal range of motion. Neck supple. No thyromegaly present.  Cardiovascular: Normal rate and regular rhythm. No murmurs or rubs Respiratory: Effort normal and breath sounds normal. No respiratory distress. No wheezes GI: Soft. Bowel sounds are normal. He exhibits no distension.  Neurological:  Anxious.Speech is slightly dysarthric but very intelligible.Follows simple commands.Oriented x 3.Fair to good overall insight   motor strength is 0/5 in the right deltoid, bicep, tricep, grip. He had trace movement with right HE and HAD, 0/5 withknee extensors, ankle dorsi flexion plantar flexor. 5/5 on the left side in the upper and lower limb  Sensation 1+/2 to light touch in the right upper and right lower limb. Visual fields are intact. No spatial inateention  Sitting balance is good. Right cranial nerve 7 central, right tongue deviation Musculoskeletal: mild pain right knee/ +/- effusion Skin: grossly intact    Post Admission Physician Evaluation:  1. Functional deficits secondary to left subcortical white matter infarct. 2. Patient is admitted to receive collaborative, interdisciplinary care between the physiatrist, rehab nursing staff, and therapy team. 3. Patient's level of medical complexity and substantial therapy needs in context of that medical necessity cannot be provided at a lesser intensity of care such as a SNF.  4. Patient has experienced substantial functional loss from his/her baseline which was documented above under the "Functional History" and "Functional Status" headings. Judging by the patient's diagnosis, physical exam, and functional history, the patient has potential for functional progress which will result in measurable gains while on inpatient rehab. These gains will be of substantial and practical use upon discharge in facilitating mobility and self-care at the household level. 5. Physiatrist will provide 24 hour management of  medical needs as well as oversight of the therapy plan/treatment and provide guidance as appropriate regarding the interaction of the two. 6. 24 hour rehab nursing will assist with bladder management, bowel management, safety, skin/wound care, disease management, medication administration, pain management and patient education and help integrate therapy concepts, techniques,education, etc. 7. PT will assess and treat for/with: Lower extremity strength, range of motion, stamina, balance, functional mobility, safety, adaptive techniques and equipment, NMR, education. Goals are: supervision to min assist with w/c mobility, transfer, min to mod assist with short dx gait. 8. OT will assess and treat for/with: ADL's, functional mobility, safety, upper extremity strength, adaptive techniques and equipment, NMR, education. Goals are: mod I to min assist. 9. SLP will assess and treat for/with: speech, communication. Goals are: mod I. 10. Case Management and Social Worker will assess and treat for psychological issues and discharge planning. 11. Team conference will be held weekly to assess progress toward goals and to determine barriers to discharge. 12. Patient will receive at least 3 hours of therapy per day at least 5 days per week. 13. ELOS: 20-24 days  14. Prognosis: excellent   Medical Problem List and Plan:  1. Thrombotic left subcortical white matter infarct  2. DVT Prophylaxis/Anticoagulation: Subcutaneous heparin. Monitor platelet counts any signs of bleeding  3. Pain Management: Hydrocodone as needed. Monitor with increased mobility  4. Mood/anxiety. Ativan 0.5 mg every 6 hours as needed.  5. Neuropsych: This patient is capable of making decisions on his own behalf.  6. Hypertension. Norvasc 10 mg daily, Coreg 12.5 mg twice a day. Monitor with increased mobility  7. History of gout. Continue colchicine as prior to admission. Has had some pain in the right knee 8. Hyperlipidemia. Lipitor    Ranelle Oyster, MD, [facility] Physical Medicine & Rehabilitation   12/2/201

## 2013-02-08 NOTE — Progress Notes (Signed)
Patient alert and oriented x4.  Patient is requesting that the bed alarm be turned off.  RN explained to patient that the alarm is used as a reminder for patients to call for assistance and to alert staff that a patient needs assistance; also explained that the alarm is used the first 24 hours on all newly admitted patients.  Patient verbalizes understanding of the use of the bed alarm and continues to refuse to have it activated.  Patient agrees to call for assistance when transferring and also signed the Fall Prevention Patient Safety Plan.  Will continue to monitor.

## 2013-02-08 NOTE — Progress Notes (Signed)
Patient admitted from 4N.  Patient alert and oriented x4; denies pain; vitals stable.  Patient and son oriented to room and unit.  Will continue to monitor.

## 2013-02-09 ENCOUNTER — Inpatient Hospital Stay (HOSPITAL_COMMUNITY): Payer: BC Managed Care – PPO | Admitting: Speech Pathology

## 2013-02-09 ENCOUNTER — Inpatient Hospital Stay (HOSPITAL_COMMUNITY): Payer: BC Managed Care – PPO | Admitting: Rehabilitation

## 2013-02-09 ENCOUNTER — Inpatient Hospital Stay (HOSPITAL_COMMUNITY): Payer: BC Managed Care – PPO | Admitting: Occupational Therapy

## 2013-02-09 DIAGNOSIS — G8110 Spastic hemiplegia affecting unspecified side: Secondary | ICD-10-CM

## 2013-02-09 DIAGNOSIS — I6330 Cerebral infarction due to thrombosis of unspecified cerebral artery: Secondary | ICD-10-CM

## 2013-02-09 LAB — COMPREHENSIVE METABOLIC PANEL
Albumin: 4 g/dL (ref 3.5–5.2)
BUN: 24 mg/dL — ABNORMAL HIGH (ref 6–23)
CO2: 29 mEq/L (ref 19–32)
Calcium: 10.2 mg/dL (ref 8.4–10.5)
Chloride: 100 mEq/L (ref 96–112)
Creatinine, Ser: 1.29 mg/dL (ref 0.50–1.35)
GFR calc Af Amer: 73 mL/min — ABNORMAL LOW (ref >90)
GFR calc non Af Amer: 63 mL/min — ABNORMAL LOW (ref >90)
Total Bilirubin: 0.8 mg/dL (ref 0.3–1.2)
Total Protein: 7.9 g/dL (ref 6.0–8.3)

## 2013-02-09 LAB — CBC WITH DIFFERENTIAL/PLATELET
Basophils Relative: 0 % (ref 0–1)
Eosinophils Relative: 1 % (ref 0–5)
HCT: 45.8 % (ref 39.0–52.0)
Hemoglobin: 16.1 g/dL (ref 13.0–17.0)
Lymphocytes Relative: 26 % (ref 12–46)
MCH: 33.3 pg (ref 26.0–34.0)
MCHC: 35.2 g/dL (ref 30.0–36.0)
MCV: 94.8 fL (ref 78.0–100.0)
Monocytes Absolute: 0.6 10*3/uL (ref 0.1–1.0)
Monocytes Relative: 7 % (ref 3–12)
Neutro Abs: 5.3 10*3/uL (ref 1.7–7.7)
Neutrophils Relative %: 65 % (ref 43–77)
Platelets: 239 10*3/uL (ref 150–400)
RDW: 13.1 % (ref 11.5–15.5)
WBC: 8.1 10*3/uL (ref 4.0–10.5)

## 2013-02-09 NOTE — Progress Notes (Signed)
Patient information reviewed and entered into eRehab system by Domnique Vanegas, RN, CRRN, PPS Coordinator.  Information including medical coding and functional independence measure will be reviewed and updated through discharge.    

## 2013-02-09 NOTE — Evaluation (Addendum)
Speech Language Pathology Assessment and Plan  Patient Details  Name: Bruce Guerrero MRN: [id number] Date of Birth: 24-Nov-1960  SLP Diagnosis: Dysarthria  Rehab Potential: Good ELOS: ~1 week for SLP; defer CIR LOS to OT/PT   Today's Date: 02/09/2013 Time: 4098-1191 Time Calculation (min): 31 min  Problem List:  Patient Active Problem List   Diagnosis Date Noted  . CVA (cerebral infarction) 02/01/2013  . Gout 08/05/2012  . HTN (hypertension) 08/05/2012   Past Medical History:  Past Medical History  Diagnosis Date  . Gout   . Hypertension    Past Surgical History: History reviewed. No pertinent past surgical history.  Assessment / Plan / Recommendation Clinical Impression  Bruce Guerrero is a 52 y.o. right-handed male with history of hypertension. Patient was independent prior to admission working full time. Admitted 02/01/2013 with right-sided weakness and slurred speech. MRI of the brain showed a moderately enlarged deep white matter infarction on the left. MRA of the head with no large vessel occlusion identified. Carotid Dopplers with no ICA stenosis. Echocardiogram with ejection fraction of 60% grade 1 diastolic dysfunction. Patient did not receive TPA. Neurology services consulted presently maintained on Plavix for CVA prophylaxis with the addition of subcutaneous heparin for DVT prophylaxis. He is tolerating a regular diet. Physical and occupational therapy evaluations completed 02/02/2013 with recommendations for physical medicine rehabilitation consult to consider inpatient rehabilitation services. Patient was felt to be a good candidate for inpatient rehabilitation services and was admitted for comprehensive rehabilitation program 02/08/13.  Orders received for SLE; and Speech Evaluation completed.  Patient demonstrates grossly intact cognition and language abilities that are [facility].  Patient continues to demonstrate right labial weakness, which impacts his overall speech  intelligibility.  Patient is currently requiring Supervision level cues to complete oral motor exercises, to identify and utilize speech intelligibility strategies and therefore skilled SLP services are recommended for 3x/day for 1 week.     SLP initiated treatment session with requests for patient to return demonstration of oral motor exercises that were introduced on acute care and patient required Supervision level question cues to recall and demonstrate them.  Following SLP introduction of an external aid patient was Mod I.    SLP Assessment  Patient will need skilled Speech Lanaguage Pathology Services during CIR admission    Recommendations  Oral Care Recommendations: Oral care BID Patient destination: Home Follow up Recommendations: None Equipment Recommended: None recommended by SLP    SLP Frequency 3 out of 7 days   SLP Treatment/Interventions Cueing hierarchy;Environmental controls;Functional tasks;Internal/external aids;Oral motor exercises;Patient/family education;Therapeutic Activities    Pain Pain Assessment Pain Assessment: No/denies pain Pain Score: 0-No pain Prior Functioning WFL  Short Term Goals: Week 1: SLP Short Term Goal 1 (Week 1): Patient will identify 2 helpful compensatory strategies that increase intelligibility with Mod I. SLP Short Term Goal 2 (Week 1): Patient will self-monitor and correct intelligibility errors with increased wait time.  See FIM for current functional status Refer to Care Plan for Long Term Goals  Recommendations for other services: None  Discharge Criteria: Patient will be discharged from SLP if patient refuses treatment 3 consecutive times without medical reason, if treatment goals not met, if there is a change in medical status, if patient makes no progress towards goals or if patient is discharged from hospital.  The above assessment, treatment plan, treatment alternatives and goals were discussed and mutually agreed upon: by  patient  Fae Pippin, M.A., CCC-SLP [phone]  Bruce Guerrero 02/09/2013, 2:50 PM

## 2013-02-09 NOTE — Evaluation (Signed)
Occupational Therapy Assessment and Plan  Patient Details  Name: Bruce Guerrero MRN: [id number] Date of Birth: Jul 06, 1960  OT Diagnosis: abnormal posture, flaccid hemiplegia and hemiparesis, hemiplegia affecting dominant side and muscle weakness (generalized) Rehab Potential: Rehab Potential: Excellent ELOS: 21 days   Today's Date: 02/09/2013 Time: 0902-1001 Time Calculation (min): 59 min  Problem List:  Patient Active Problem List   Diagnosis Date Noted  . CVA (cerebral infarction) 02/01/2013  . Gout 08/05/2012  . HTN (hypertension) 08/05/2012    Past Medical History:  Past Medical History  Diagnosis Date  . Gout   . Hypertension    Past Surgical History: History reviewed. No pertinent past surgical history.  Assessment & Plan Clinical Impression: Patient is a 52 y.o. year old male with recent admission to the hospital on 02/01/2013 with right-sided weakness and slurred speech. MRI of the brain showed a moderately enlarged deep white matter infarction on the left. MRA of the head with no large vessel occlusion identified.  Patient transferred to CIR on 02/08/2013 .    Patient currently requires max with basic self-care skills secondary to muscle weakness and muscle paralysis and impaired timing and sequencing, unbalanced muscle activation and decreased coordination.  Prior to hospitalization, patient could complete ADLs and IADLs with min.  Patient will benefit from skilled intervention to decrease level of assist with basic self-care skills, increase independence with basic self-care skills and increase level of independence with iADL prior to discharge home with son and 24 hour supervision from family.  Anticipate patient will require minimal physical assistance and follow up outpatient.  OT - End of Session Activity Tolerance: Tolerates 30+ min activity without fatigue Endurance Deficit: No OT Assessment Rehab Potential: Excellent Barriers to Discharge: Decreased  caregiver support Barriers to Discharge Comments: pt will need 24 hour supervision, family will have to arrange OT Patient demonstrates impairments in the following area(s): Balance;Motor;Safety;Sensory OT Basic ADL's Functional Problem(s): Eating;Grooming;Bathing;Dressing;Toileting OT Advanced ADL's Functional Problem(s): Simple Meal Preparation OT Transfers Functional Problem(s): Toilet;Tub/Shower OT Additional Impairment(s): Fuctional Use of Upper Extremity OT Plan OT Intensity: Minimum of 1-2 x/day, 45 to 90 minutes OT Frequency: 5 out of 7 days OT Duration/Estimated Length of Stay: 21 days OT Treatment/Interventions: Metallurgist training;Community reintegration;Discharge planning;Functional mobility training;Functional electrical stimulation;DME/adaptive equipment instruction;Neuromuscular re-education;Patient/family education;Pain management;Therapeutic Activities;Psychosocial support;Splinting/orthotics;UE/LE Coordination activities;UE/LE Strength taining/ROM;Self Care/advanced ADL retraining;Therapeutic Exercise OT Self Feeding Anticipated Outcome(s): modified independent OT Basic Self-Care Anticipated Outcome(s): supervision OT Toileting Anticipated Outcome(s): supervision OT Bathroom Transfers Anticipated Outcome(s): min assist OT Recommendation Patient destination: Home Follow Up Recommendations: Outpatient OT Equipment Recommended: 3 in 1 bedside comode;Tub/shower bench   OT Evaluation Precautions/Restrictions  Precautions Precautions: Fall Precaution Comments: right hemiparesis Restrictions Weight Bearing Restrictions: No  Pain Pain Assessment Pain Assessment: No/denies pain Home Living/Prior Functioning Home Living Available Help at Discharge: Family;Available 24 hours/day Type of Home: House Home Access: Stairs to enter [employer] of Steps: 3 Entrance Stairs-Rails: None Home Layout: One level Additional Comments: feels that w/c would fit, but  unsure of door width at time of eval  Lives With: Son;Family Prior Function Level of Independence: Independent with basic ADLs;Independent with gait  Able to Take Stairs?: Yes Driving: Yes Vocation: Full time employment Leisure: Hobbies-yes (Comment) Comments: work, likes to watch TV, but usually takes care of mother in spare time ADL  See FIM scale  Vision/Perception  Vision - History Baseline Vision: No visual deficits Patient Visual Report: No change from baseline Vision - Assessment Eye Alignment: Within Functional Limits Vision Assessment:  Vision tested Ocular Range of Motion: Within Functional Limits Tracking/Visual Pursuits: Able to track stimulus in all quads without difficulty Saccades: Within functional limits Visual Fields: No apparent deficits Perception Perception: Within Functional Limits Praxis Praxis: Intact  Cognition Overall Cognitive Status: Within Functional Limits for tasks assessed Arousal/Alertness: Awake/alert Orientation Level: Oriented X4 Attention: Sustained Sustained Attention: Appears intact (with selfcare tasks) Memory: Appears intact Awareness: Appears intact (during OT session) Safety/Judgment: Appears intact Comments: Pt demonstrated good awareness of his deficits and that he is currenlty not safe to get up without assistance.   Sensation Sensation Light Touch: Appears Intact Stereognosis: Not tested Hot/Cold: Not tested Proprioception: Appears Intact Additional Comments: Pt with intact light touch and proprioception in the RUE. Coordination Gross Motor Movements are Fluid and Coordinated: No Fine Motor Movements are Fluid and Coordinated: No Coordination and Movement Description: Pt is currently Brunnstrum stage I in the right hand and stage II in the right arm.  Does not use functionally for any aspect of bathing or dressing. Motor  Motor Motor: Hemiplegia;Abnormal postural alignment and control Motor - Skilled Clinical  Observations: Pt with hemiparesis of the right UE and LE.  Demonstrates forwad flexion in standing with decreased ability to maintain right hip or knee extension. Mobility  Transfers Transfers: Sit to Stand;Stand to Sit Sit to Stand: 2: Max assist;Without upper extremity assist;From chair/3-in-1 Sit to Stand Details: Manual facilitation for weight shifting;Verbal cues for sequencing Stand to Sit: 2: Max assist;With armrests Stand to Sit Details (indicate cue type and reason): Verbal cues for sequencing;Manual facilitation for weight shifting  Trunk/Postural Assessment  Cervical Assessment Cervical Assessment: Within Functional Limits Thoracic Assessment Thoracic Assessment: Within Functional Limits Lumbar Assessment Lumbar Assessment: Within Functional Limits Postural Control Postural Control: Deficits on evaluation Trunk Control: Pt sits in a posterior pelvic tilt with increased weightshift and passive elongation on the left side.    Balance Dynamic Standing Balance Dynamic Standing - Balance Support: Left upper extremity supported Dynamic Standing - Level of Assistance: 1: +1 Total assist Dynamic Standing - Balance Activities: Lateral lean/weight shifting;Other (comment) Dynamic Standing - Comments: during transfers with ADL tasks Extremity/Trunk Assessment RUE Assessment RUE Assessment: Exceptions to [facility] RUE Strength RUE Overall Strength Comments: Pt with Brunnstrum stage II in the right arm and stage I in the hand.  PROM WFLs for all joints.  Pt reporting slight elbow pain with end range elbow flexion. LUE Assessment LUE Assessment: Within Functional Limits  FIM:  FIM - Eating Eating Activity: 5: Supervision/cues FIM - Grooming Grooming Steps: Wash, rinse, dry face;Brush, comb hair Grooming: 4: Patient completes 3 of 4 or 4 of 5 steps FIM - Bathing Bathing Steps Patient Completed: Chest;Right Arm;Abdomen;Right upper leg;Left upper leg Bathing: 3: Mod-Patient completes 5-7  105f 10 parts or 50-74% FIM - Upper Body Dressing/Undressing Upper body dressing/undressing steps patient completed: Put head through opening of pull over shirt/dress;Thread/unthread left sleeve of pullover shirt/dress;Pull shirt over trunk Upper body dressing/undressing: 4: Min-Patient completed 75 plus % of tasks FIM - Lower Body Dressing/Undressing Lower body dressing/undressing steps patient completed: Thread/unthread left pants leg;Don/Doff left sock Lower body dressing/undressing: 2: Max-Patient completed 25-49% of tasks FIM - Toileting Toileting steps completed by patient: Performs perineal hygiene Toileting: 2: Max-Patient completed 1 of 3 steps   Refer to Care Plan for Long Term Goals  Recommendations for other services: None  Discharge Criteria: Patient will be discharged from OT if patient refuses treatment 3 consecutive times without medical reason, if treatment goals not met, if there  is a change in medical status, if patient makes no progress towards goals or if patient is discharged from hospital.  The above assessment, treatment plan, treatment alternatives and goals were discussed and mutually agreed upon: by patient and by family  Began education on selfcare retraining, hemi dressing techniques, sit to stand and standing balance, as well as RUE positioning.  Provided 1/2 lap tray to support the RUE as well.  Pt currently overall max assist for basic selfcare tasks.  Bucky Grigg OTR/L 02/09/2013, 12:14 PM

## 2013-02-09 NOTE — Progress Notes (Signed)
Patient alert and oriented x4. Patient requested from previous RN that bed alarm not be used and would alert staff when transfer assistance was needed. Patient had family member in room all night and received transfer assistance from chair to bed and bed to chair on several occasions. Patient and family member have verbal understanding of safety plan and fall prevention safety plan. Will continue to monitor for safety.

## 2013-02-09 NOTE — Evaluation (Signed)
Physical Therapy Assessment and Plan  Patient Details  Name: Bruce Guerrero MRN: [id number] Date of Birth: Jul 12, 1960  PT Diagnosis: Abnormal posture, Abnormality of gait, Hemiparesis dominant and Muscle weakness Rehab Potential: Good ELOS: 21 days   Today's Date: 02/09/2013 Time: 4098-1191 Time Calculation (min): 59 min  Problem List:  Patient Active Problem List   Diagnosis Date Noted  . CVA (cerebral infarction) 02/01/2013  . Gout 08/05/2012  . HTN (hypertension) 08/05/2012    Past Medical History:  Past Medical History  Diagnosis Date  . Gout   . Hypertension    Past Surgical History: History reviewed. No pertinent past surgical history.  Assessment & Plan Clinical Impression: Patient is a 52 y.o. year old male with recent admission to the hospital on 02/01/2013 with right-sided weakness and slurred speech. MRI of the brain showed a moderately enlarged deep white matter infarction on the left. MRA of the head with no large vessel occlusion identified. Patient transferred to CIR on 02/08/2013 . Patient transferred to CIR on 02/08/2013 .   Patient currently requires max with mobility secondary to muscle weakness and decreased coordination and decreased motor planning.  Prior to hospitalization, patient was independent  with mobility and lived with Son;Family in a House home.  Home access is 3Stairs to enter.  Patient will benefit from skilled PT intervention to maximize safe functional mobility, minimize fall risk and decrease caregiver burden for planned discharge home with 24 hour assist.  Anticipate patient will benefit from follow up HH vs OP at discharge.  PT - End of Session Activity Tolerance: Endurance does not limit participation in activity Endurance Deficit: No PT Assessment Rehab Potential: Good Barriers to Discharge: Inaccessible home environment PT Patient demonstrates impairments in the following area(s): Balance;Motor;Endurance PT Transfers Functional  Problem(s): Bed Mobility;Bed to Chair;Car;Furniture PT Locomotion Functional Problem(s): Ambulation;Wheelchair Mobility;Stairs PT Plan PT Intensity: Minimum of 1-2 x/day ,45 to 90 minutes PT Frequency: 5 out of 7 days PT Duration Estimated Length of Stay: 21 days PT Treatment/Interventions: Ambulation/gait training;Balance/vestibular training;Community reintegration;Discharge planning;DME/adaptive equipment instruction;Functional electrical stimulation;Functional mobility training;Neuromuscular re-education;Patient/family education;Splinting/orthotics;Stair training;Therapeutic Activities;Therapeutic Exercise;UE/LE Strength taining/ROM;UE/LE Coordination activities;Wheelchair propulsion/positioning PT Transfers Anticipated Outcome(s): min assist PT Locomotion Anticipated Outcome(s): min assist PT Recommendation Follow Up Recommendations: Home health PT;Outpatient PT;24 hour supervision/assistance (pending progress) Patient destination: Home Equipment Recommended: Wheelchair (measurements);Wheelchair cushion (measurements);To be determined  Skilled Therapeutic Intervention Performed all assessment during evaluation portion of session.  See details below.  Also initiated gait training with use of hemi walker and ace wrap on RLE to provide DF assist during swing phase of gait.  Note he is able to advance RLE, however does compensate with trunk extension and ability to advance LE decreases with fatigue.  See full details under locomotion section below.  Discussed possible need for AFO at time of D/C, possible equipment needs, follow up therapy at D/C and rehab schedule while at CIR.  Pt and brother verbalize understanding.    PT Evaluation Precautions/Restrictions Precautions Precautions: Fall Precaution Comments: right hemiparesis Restrictions Weight Bearing Restrictions: No General Chart Reviewed: Yes Family/Caregiver Present: Yes (brother) Vital Signs  Pain Pain Assessment Pain  Assessment: No/denies pain Pain Score: 0-No pain Home Living/Prior Functioning Home Living Available Help at Discharge: Family;Available 24 hours/day Type of Home: House Home Access: Stairs to enter [employer] of Steps: 3 Entrance Stairs-Rails: None Home Layout: One level Additional Comments: feels that w/c would fit, but unsure of door width at time of eval  Lives With: Son;Family Prior Function Level of  Independence: Independent with basic ADLs;Independent with gait  Able to Take Stairs?: Yes Driving: Yes Vocation: Full time employment Leisure: Hobbies-yes (Comment) Comments: work, likes to watch TV, but usually takes care of mother in spare time Vision/Perception  Vision - History Baseline Vision: No visual deficits Patient Visual Report: No change from baseline Vision - Assessment Eye Alignment: Within Functional Limits Vision Assessment: Vision tested Ocular Range of Motion: Within Functional Limits Tracking/Visual Pursuits: Able to track stimulus in all quads without difficulty Saccades: Within functional limits Visual Fields: No apparent deficits Perception Perception: Within Functional Limits Praxis Praxis: Intact  Cognition Overall Cognitive Status: Within Functional Limits for tasks assessed Arousal/Alertness: Awake/alert Orientation Level: Oriented X4 Attention: Sustained Sustained Attention: Appears intact (with selfcare tasks) Memory: Appears intact Awareness: Appears intact (during OT session) Safety/Judgment: Appears intact Comments: Pt demonstrated good awareness of his deficits and that he is currenlty not safe to get up without assistance.   Sensation Sensation Light Touch: Appears Intact Stereognosis: Not tested Hot/Cold: Not tested Proprioception: Appears Intact Additional Comments: Pt with intact light touch and proprioception in the RLE Coordination Gross Motor Movements are Fluid and Coordinated: No Fine Motor Movements are Fluid  and Coordinated: No Coordination and Movement Description: Pt with 2- to 2/5 hip flex, however no other active movement in RLE noted at time of eval.  Heel Shin Test: normal L over R heel shin test Motor  Motor Motor: Hemiplegia;Abnormal postural alignment and control Motor - Skilled Clinical Observations: Pt with hemiparesis of the right UE and LE.  Demonstrates forwad flexion in standing with decreased ability to maintain right hip or knee extension.  Mobility Bed Mobility Bed Mobility: Supine to Sit;Sit to Supine Supine to Sit: 5: Supervision;HOB flat Supine to Sit Details: Verbal cues for technique;Verbal cues for precautions/safety Sit to Supine: 5: Supervision Sit to Supine - Details: Verbal cues for precautions/safety;Verbal cues for technique Transfers Transfers: Yes Sit to Stand: 2: Max assist;From chair/3-in-1;With upper extremity assist Sit to Stand Details: Manual facilitation for weight shifting;Verbal cues for sequencing;Manual facilitation for weight bearing Stand to Sit: 2: Max assist;With armrests Stand to Sit Details (indicate cue type and reason): Verbal cues for sequencing;Manual facilitation for weight shifting;Manual facilitation for weight bearing Stand Pivot Transfers: 2: Max assist Stand Pivot Transfer Details: Verbal cues for sequencing;Verbal cues for technique;Verbal cues for precautions/safety;Manual facilitation for weight bearing;Manual facilitation for weight shifting Locomotion  Ambulation Ambulation: Yes Ambulation/Gait Assistance: 1: +2 Total assist Ambulation Distance (Feet): 25 Feet (then another 10' x 2 reps) Assistive device: Hemi-walker (handrail in hallway ) Ambulation/Gait Assistance Details: Verbal cues for sequencing;Verbal cues for technique;Verbal cues for precautions/safety;Verbal cues for gait pattern;Verbal cues for safe use of DME/AE;Manual facilitation for weight shifting;Manual facilitation for placement;Manual facilitation for weight  bearing Ambulation/Gait Assistance Details: Assessed gait in hallway with use of L handrail and progressed to gait training with hemi walker and ace wrap on RLE to assist with DF during swing phase of gait.   Requires max assist (+2 for chair follow) with manual facilitation for weight shift R for increased WB through extremity with assist at R knee to prevent buckle and also for weight shift L for improved clearance.  Note he is able to clear RLE most of time, however compensates slightly with trunk extension.  Also note that he tends to decrease weight shift R causing trunk to flex during gait.  Able to correct with min verbal and manual cues.   Gait Gait: Yes Gait Pattern: Decreased step length -  left;Decreased stance time - right;Step-to pattern;Decreased weight shift to right;Right foot flat;Narrow base of support;Trunk flexed Stairs / Additional Locomotion Stairs: Yes Stairs Assistance: 2: Max Estate agent Assistance Details: Verbal cues for sequencing;Verbal cues for technique;Verbal cues for precautions/safety;Verbal cues for gait pattern;Manual facilitation for weight shifting;Manual facilitation for placement;Manual facilitation for weight bearing Stair Management Technique: One rail Left;Step to pattern;Forwards Number of Stairs: 5 Height of Stairs: 4 Wheelchair Mobility Wheelchair Mobility: Yes Wheelchair Assistance: 4: Administrator, sports Details: Verbal cues for sequencing;Verbal cues for technique;Verbal cues for precautions/safety;Manual facilitation for placement Wheelchair Propulsion: Left upper extremity;Left lower extremity Wheelchair Parts Management: Needs assistance Distance: 100  Trunk/Postural Assessment  Cervical Assessment Cervical Assessment: Within Functional Limits Thoracic Assessment Thoracic Assessment: Within Functional Limits Lumbar Assessment Lumbar Assessment: Within Functional Limits Postural Control Postural Control: Deficits on  evaluation Trunk Control: Pt sits in a posterior pelvic tilt with increased weightshift and passive elongation on the left side.    Balance Balance Balance Assessed: Yes Static Standing Balance Static Standing - Balance Support: No upper extremity supported Static Standing - Level of Assistance: 2: Max assist Dynamic Standing Balance Dynamic Standing - Balance Support: Left upper extremity supported Dynamic Standing - Level of Assistance: 1: +1 Total assist Dynamic Standing - Balance Activities: Lateral lean/weight shifting;Other (comment) Dynamic Standing - Comments: during transfers with ADL tasks Extremity Assessment  RUE Assessment RUE Assessment: Exceptions to [facility] RUE Strength RUE Overall Strength Comments: Pt with Brunnstrum stage II in the right arm and stage I in the hand.  PROM WFLs for all joints.  Pt reporting slight elbow pain with end range elbow flexion. LUE Assessment LUE Assessment: Within Functional Limits RLE Assessment RLE Assessment: Exceptions to [facility] RLE Strength RLE Overall Strength: Deficits RLE Overall Strength Comments: pt with 2- to2/5 hip flex, otherwise no active movement noted in RLE during eval.  LLE Assessment LLE Assessment: Within Functional Limits  FIM:  FIM - Banker Devices: Arm rests Bed/Chair Transfer: 5: Supine > Sit: Supervision (verbal cues/safety issues);5: Sit > Supine: Supervision (verbal cues/safety issues);2: Bed > Chair or W/C: Max A (lift and lower assist);2: Chair or W/C > Bed: Max A (lift and lower assist) FIM - Locomotion: Wheelchair Distance: 100 Locomotion: Wheelchair: 2: Travels 50 - 149 ft with minimal assistance (Pt.>75%) FIM - Locomotion: Ambulation Locomotion: Ambulation Assistive Devices: Walker - Hemi (L handrail and hemi walker) Ambulation/Gait Assistance: 1: +2 Total assist Locomotion: Ambulation: 1: Two helpers FIM - Locomotion: Stairs Locomotion: Building control surveyor:  Radio broadcast assistant - 1 Locomotion: Stairs: 2: Up and Down 4 - 11 stairs with maximal assistance (Pt: 25 - 49%)   Refer to Care Plan for Long Term Goals  Recommendations for other services: None  Discharge Criteria: Patient will be discharged from PT if patient refuses treatment 3 consecutive times without medical reason, if treatment goals not met, if there is a change in medical status, if patient makes no progress towards goals or if patient is discharged from hospital.  The above assessment, treatment plan, treatment alternatives and goals were discussed and mutually agreed upon: by patient and by family  Vista Deck 02/09/2013, 1:19 PM

## 2013-02-09 NOTE — Progress Notes (Signed)
Occupational Therapy Session Note  Patient Details  Name: Bruce Guerrero MRN: [id number] Date of Birth: [birth year]  Today's Date: 02/09/2013 Time: 1440-1510 Time Calculation (min): 30 min  Short Term Goals: Week 1:  OT Short Term Goal 1 (Week 1): Pt will perform all bathing LB bathing with mod assist including sit to stand. OT Short Term Goal 2 (Week 1): Pt will donn pullover shirt with supervision 2/3 sessions. OT Short Term Goal 3 (Week 1): Pt will perform LB dressing with mod assist sit to stand, with min instructional cueing for hemi dressing techniques. OT Short Term Goal 4 (Week 1): Pt will perform stand pivot transfer to the 3:1 with mod assist. OT Short Term Goal 5 (Week 1): Pt will perform self AAROM/PROM exercises for the RUE with supervision following handout.  Skilled Therapeutic Interventions/Progress Updates:  Patient resting in bed upon arrival with brother at his bedside.  Brother left shortly after session began.  Engaged in bed mobility, RUE active and self ROM exercises in supine and in sitting, standing balance, bed><recliner 2 times.  Patient is abel to initiate active shoulder flex, ADduction, horizonal ADduction, and elbow extension during functional mobility, upon command and during weight bearing.  Patient required max vcs not to leave his RUE behind with bed mobility when rolling to left.  Patient is able to initiate movement to cross arm over body before roll.  Patient also required cues to look at his RUE/hand when trying to use it to perform more successfully.  Patient requested to sit in recliner at end of session with all items in reach and review of need to call if want to get out of reclienr and brother arrived to provide supervision.   Therapy Documentation Precautions:  Precautions Precautions: Fall Precaution Comments: right hemiparesis Restrictions Weight Bearing Restrictions: No Pain: Pain Assessment Pain Assessment: No/denies  pain  Therapy/Group: Individual Therapy  [name] 02/09/2013, 4:35 PM

## 2013-02-09 NOTE — Progress Notes (Signed)
52 y.o. right-handed male with history of hypertension. Patient was independent prior to admission working full time. Admitted 02/01/2013 with right-sided weakness and slurred speech. MRI of the brain showed a moderately enlarged deep white matter infarction on the left. MRA of the head with no large vessel occlusion identified. Carotid Dopplers with no ICA stenosis. Echocardiogram with ejection fraction of 60% grade 1 diastolic dysfunction. Patient did not receive TPA. Neurology services consulted presently maintained on Plavix for CVA prophylaxis with the addition of subcutaneous heparin for DVT prophylaxis. He is tolerating a regular diet  Subjective/Complaints: Slept okay last night. No pain complaints. No shortness of breath or chest pain.  Review of systems: Negative except for weakness on the right side  Objective: Vital Signs: Blood pressure 151/82, pulse 66, temperature 98 F (36.7 C), temperature source Oral, resp. rate 17, height 5\' 5"  (1.651 m), weight 80.7 kg (177 lb 14.6 oz), SpO2 94.00%. No results found. Results for orders placed during the hospital encounter of 02/08/13 (from the past 72 hour(s))  CBC     Status: None   Collection Time    02/08/13  6:36 PM      Result Value Range   WBC 8.6  4.0 - 10.5 K/uL   RBC 4.94  4.22 - 5.81 MIL/uL   Hemoglobin 16.5  13.0 - 17.0 g/dL   HCT 16.1  09.6 - 04.5 %   MCV 94.5  78.0 - 100.0 fL   MCH 33.4  26.0 - 34.0 pg   MCHC 35.3  30.0 - 36.0 g/dL   RDW 40.9  81.1 - 91.4 %   Platelets 230  150 - 400 K/uL  CREATININE, SERUM     Status: Abnormal   Collection Time    02/08/13  6:36 PM      Result Value Range   Creatinine, Ser 1.33  0.50 - 1.35 mg/dL   GFR calc non Af Amer 60 (*) >90 mL/min   GFR calc Af Amer 70 (*) >90 mL/min   Comment: (NOTE)     The eGFR has been calculated using the CKD EPI equation.     This calculation has not been validated in all clinical situations.     eGFR's persistently <90 mL/min signify possible  Chronic Kidney     Disease.  CBC WITH DIFFERENTIAL     Status: None   Collection Time    02/09/13  7:01 AM      Result Value Range   WBC 8.1  4.0 - 10.5 K/uL   RBC 4.83  4.22 - 5.81 MIL/uL   Hemoglobin 16.1  13.0 - 17.0 g/dL   HCT 78.2  95.6 - 21.3 %   MCV 94.8  78.0 - 100.0 fL   MCH 33.3  26.0 - 34.0 pg   MCHC 35.2  30.0 - 36.0 g/dL   RDW 08.6  57.8 - 46.9 %   Platelets 239  150 - 400 K/uL   Neutrophils Relative % 65  43 - 77 %   Neutro Abs 5.3  1.7 - 7.7 K/uL   Lymphocytes Relative 26  12 - 46 %   Lymphs Abs 2.1  0.7 - 4.0 K/uL   Monocytes Relative 7  3 - 12 %   Monocytes Absolute 0.6  0.1 - 1.0 K/uL   Eosinophils Relative 1  0 - 5 %   Eosinophils Absolute 0.1  0.0 - 0.7 K/uL   Basophils Relative 0  0 - 1 %   Basophils Absolute 0.0  0.0 - 0.1 K/uL  COMPREHENSIVE METABOLIC PANEL     Status: Abnormal   Collection Time    02/09/13  7:01 AM      Result Value Range   Sodium 140  135 - 145 mEq/L   Potassium 4.4  3.5 - 5.1 mEq/L   Chloride 100  96 - 112 mEq/L   CO2 29  19 - 32 mEq/L   Glucose, Bld 126 (*) 70 - 99 mg/dL   BUN 24 (*) 6 - 23 mg/dL   Creatinine, Ser 1.61  0.50 - 1.35 mg/dL   Calcium 09.6  8.4 - 04.5 mg/dL   Total Protein 7.9  6.0 - 8.3 g/dL   Albumin 4.0  3.5 - 5.2 g/dL   AST 84 (*) 0 - 37 U/L   ALT 98 (*) 0 - 53 U/L   Alkaline Phosphatase 107  39 - 117 U/L   Total Bilirubin 0.8  0.3 - 1.2 mg/dL   GFR calc non Af Amer 63 (*) >90 mL/min   GFR calc Af Amer 73 (*) >90 mL/min   Comment: (NOTE)     The eGFR has been calculated using the CKD EPI equation.     This calculation has not been validated in all clinical situations.     eGFR's persistently <90 mL/min signify possible Chronic Kidney     Disease.     HEENT: normal Cardio: RRR and no murmurs Resp: CTA B/L and unlabored GI: BS positive and non tender, nondistended Extremity:  Pulses positive and No Edema Skin:   Intact Neuro: Alert/Oriented, Flat, Cranial Nerve Abnormalities Right central 7, Normal  Sensory, Abnormal Motor Trace right shoulder adduction, trace right hip extension and Dysarthric Musc/Skel:  Normal and Other No shoulder pain with range of motion General no acute distress   Assessment/Plan: 1. Functional deficits secondary to Left subcortical infarct with severe right hemiplegia which require 3+ hours per day of interdisciplinary therapy in a comprehensive inpatient rehab setting. Physiatrist is providing close team supervision and 24 hour management of active medical problems listed below. Physiatrist and rehab team continue to assess barriers to discharge/monitor patient progress toward functional and medical goals. FIM:                                  Medical Problem List and Plan:  1. Thrombotic left subcortical white matter infarct  2. DVT Prophylaxis/Anticoagulation: Subcutaneous heparin. Monitor platelet counts any signs of bleeding  3. Pain Management: Hydrocodone as needed. Monitor with increased mobility  4. Mood/anxiety. Ativan 0.5 mg every 6 hours as needed.  5. Neuropsych: This patient is capable of making decisions on his own behalf.  6. Hypertension. Norvasc 10 mg daily, Coreg 12.5 mg twice a day. Monitor with increased mobility  7. History of gout. Continue colchicine as prior to admission. Has had some pain in the right knee  8. Hyperlipidemia. Lipitor    LOS (Days) 1 A FACE TO FACE EVALUATION WAS PERFORMED  Jaquanda Wickersham E 02/09/2013, 9:06 AM

## 2013-02-10 ENCOUNTER — Inpatient Hospital Stay (HOSPITAL_COMMUNITY): Payer: BC Managed Care – PPO | Admitting: Speech Pathology

## 2013-02-10 ENCOUNTER — Inpatient Hospital Stay (HOSPITAL_COMMUNITY): Payer: BC Managed Care – PPO | Admitting: Rehabilitation

## 2013-02-10 ENCOUNTER — Inpatient Hospital Stay (HOSPITAL_COMMUNITY): Payer: BC Managed Care – PPO | Admitting: Occupational Therapy

## 2013-02-10 ENCOUNTER — Inpatient Hospital Stay (HOSPITAL_COMMUNITY): Payer: BC Managed Care – PPO

## 2013-02-10 DIAGNOSIS — G8110 Spastic hemiplegia affecting unspecified side: Secondary | ICD-10-CM

## 2013-02-10 DIAGNOSIS — I6330 Cerebral infarction due to thrombosis of unspecified cerebral artery: Secondary | ICD-10-CM

## 2013-02-10 NOTE — Progress Notes (Signed)
I have reviewed and agree with the attached treatment note.  Genie Wenke, OTR/L 

## 2013-02-10 NOTE — Progress Notes (Signed)
Physical Therapy Session Note  Patient Details  Name: Bruce Guerrero MRN: [id number] Date of Birth: 1960-09-25  Today's Date: 02/10/2013 Time: 1430-1530 Time Calculation (min): 60 min  Short Term Goals: Week 1:  PT Short Term Goal 1 (Week 1): Pt will perform dynamic standing balance with mod assist PT Short Term Goal 2 (Week 1): Pt will perform stand pivot transfers R and L at mod assist with safe technique PT Short Term Goal 3 (Week 1): Pt will perform w/c mobility x 150' at supervision level using L hemi technique PT Short Term Goal 4 (Week 1): Pt will ambulate x 50' w/ LRAD at max assist of one person  Skilled Therapeutic Interventions/Progress Updates:   Pt received sitting in recliner in room.  Brother, Gwynneth Munson present during session and wanting to perform hands on transfer training in order to assist pt in his room with transfers.  Performed 5 reps of squat pivot transfer L and R with demonstration from PT on first rep with step by step instruction on guarding R knee, proper body mechanics and proper handling techniques at pts waist.  Brother returned demonstration and signed him ONLY to perform transfers with pt.  Educated that other family members will also have to be signed off prior to assisting pt with transfers.  Focus of session was dynamic standing balance to focus on NMR through RLE.  See details below.  Also performed gait training in hallway with use of L handrail.  See details below.  Pt left in recliner in room with education for R arm to be propped on pillow to decrease shoulder injury.  All needs in reach.    Therapy Documentation Precautions:  Precautions Precautions: Fall Precaution Comments: right hemiparesis Restrictions Weight Bearing Restrictions: No   Vital Signs: Therapy Vitals Temp: 97.9 F (36.6 C) Temp src: Oral Pulse Rate: 61 Resp: 18 BP: 150/81 mmHg Patient Position, if appropriate: Lying Oxygen Therapy SpO2: 96 % O2 Device: None (Room  air) Pain: Mild pain in R knee, states that it is gout related.    Locomotion : Ambulation Ambulation: Yes Ambulation/Gait Assistance: 1: +2 Total assist Ambulation Distance (Feet): 25 Feet (x2 reps) Assistive device:  (L handrail) Ambulation/Gait Assistance Details: Tactile cues for initiation;Verbal cues for sequencing;Verbal cues for gait pattern;Manual facilitation for weight shifting;Manual facilitation for placement;Manual facilitation for weight bearing;Verbal cues for technique;Verbal cues for precautions/safety Ambulation/Gait Assistance Details: Continue with gait training in hallway using L hanrail for UE support and ace wrap on RLE for increased DF assist.  Provided manual facilitation for R weight shift and weight bearing through RLE, blocking R knee to prevent buckle and provide facilitation for quad control.   Provided min cues for upright posture, however note he did much better today with upright posture and on second rep did better with increased WB through RLE for more controlled L step.   Gait Gait: Yes Gait Pattern: Impaired Gait Pattern: Decreased step length - left;Decreased stance time - right;Step-to pattern;Decreased stride length;Decreased dorsiflexion - right;Decreased weight shift to right;Right flexed knee in stance;Right foot flat;Trunk flexed;Narrow base of support Wheelchair Mobility Distance: 150    Other Treatments: Treatments Therapeutic Activity: Attempted to perform tall kneeling activity for increased WB through RLE/UE, however pt unable to tolerate increased weight on knees.  Transitioned to standing activity reaching diagonally and side to side to simulate functional activity and also to increase weight shift to the R and also assist at R knee to facilitate increased quad and also for glute  activaiton.  Pt noted to be impulsive with reaching and placing items and also tends to reach for moveable table for support.  Provided max cues for safety and increased  controlled movements for more sustained muscle activation.  Neuromuscular Facilitation: Right;Lower Extremity;Activity to increase motor control;Activity to increase sustained activation;Activity to increase lateral weight shifting;Forced use  See FIM for current functional status  Therapy/Group: Individual Therapy  Vista Deck 02/10/2013, 4:48 PM

## 2013-02-10 NOTE — Progress Notes (Signed)
52 y.o. right-handed male with history of hypertension. Patient was independent prior to admission working full time. Admitted 02/01/2013 with right-sided weakness and slurred speech. MRI of the brain showed a moderately enlarged deep white matter infarction on the left. MRA of the head with no large vessel occlusion identified. Carotid Dopplers with no ICA stenosis. Echocardiogram with ejection fraction of 60% grade 1 diastolic dysfunction. Patient did not receive TPA. Neurology services consulted presently maintained on Plavix for CVA prophylaxis with the addition of subcutaneous heparin for DVT prophylaxis. He is tolerating a regular diet  Subjective/Complaints: Slept okay last night. No pain complaints. Did ok in PT/OT yesterday, no fatigue  Review of systems: Negative except for weakness on the right side  Objective: Vital Signs: Blood pressure 141/80, pulse 59, temperature 98 F (36.7 C), temperature source Oral, resp. rate 17, height 5\' 5"  (1.651 m), weight 82.101 kg (181 lb), SpO2 94.00%. No results found. Results for orders placed during the hospital encounter of 02/08/13 (from the past 72 hour(s))  CBC     Status: None   Collection Time    02/08/13  6:36 PM      Result Value Range   WBC 8.6  4.0 - 10.5 K/uL   RBC 4.94  4.22 - 5.81 MIL/uL   Hemoglobin 16.5  13.0 - 17.0 g/dL   HCT 16.1  09.6 - 04.5 %   MCV 94.5  78.0 - 100.0 fL   MCH 33.4  26.0 - 34.0 pg   MCHC 35.3  30.0 - 36.0 g/dL   RDW 40.9  81.1 - 91.4 %   Platelets 230  150 - 400 K/uL  CREATININE, SERUM     Status: Abnormal   Collection Time    02/08/13  6:36 PM      Result Value Range   Creatinine, Ser 1.33  0.50 - 1.35 mg/dL   GFR calc non Af Amer 60 (*) >90 mL/min   GFR calc Af Amer 70 (*) >90 mL/min   Comment: (NOTE)     The eGFR has been calculated using the CKD EPI equation.     This calculation has not been validated in all clinical situations.     eGFR's persistently <90 mL/min signify possible Chronic  Kidney     Disease.  CBC WITH DIFFERENTIAL     Status: None   Collection Time    02/09/13  7:01 AM      Result Value Range   WBC 8.1  4.0 - 10.5 K/uL   RBC 4.83  4.22 - 5.81 MIL/uL   Hemoglobin 16.1  13.0 - 17.0 g/dL   HCT 78.2  95.6 - 21.3 %   MCV 94.8  78.0 - 100.0 fL   MCH 33.3  26.0 - 34.0 pg   MCHC 35.2  30.0 - 36.0 g/dL   RDW 08.6  57.8 - 46.9 %   Platelets 239  150 - 400 K/uL   Neutrophils Relative % 65  43 - 77 %   Neutro Abs 5.3  1.7 - 7.7 K/uL   Lymphocytes Relative 26  12 - 46 %   Lymphs Abs 2.1  0.7 - 4.0 K/uL   Monocytes Relative 7  3 - 12 %   Monocytes Absolute 0.6  0.1 - 1.0 K/uL   Eosinophils Relative 1  0 - 5 %   Eosinophils Absolute 0.1  0.0 - 0.7 K/uL   Basophils Relative 0  0 - 1 %   Basophils Absolute 0.0  0.0 -  0.1 K/uL  COMPREHENSIVE METABOLIC PANEL     Status: Abnormal   Collection Time    02/09/13  7:01 AM      Result Value Range   Sodium 140  135 - 145 mEq/L   Potassium 4.4  3.5 - 5.1 mEq/L   Chloride 100  96 - 112 mEq/L   CO2 29  19 - 32 mEq/L   Glucose, Bld 126 (*) 70 - 99 mg/dL   BUN 24 (*) 6 - 23 mg/dL   Creatinine, Ser 1.61  0.50 - 1.35 mg/dL   Calcium 09.6  8.4 - 04.5 mg/dL   Total Protein 7.9  6.0 - 8.3 g/dL   Albumin 4.0  3.5 - 5.2 g/dL   AST 84 (*) 0 - 37 U/L   ALT 98 (*) 0 - 53 U/L   Alkaline Phosphatase 107  39 - 117 U/L   Total Bilirubin 0.8  0.3 - 1.2 mg/dL   GFR calc non Af Amer 63 (*) >90 mL/min   GFR calc Af Amer 73 (*) >90 mL/min   Comment: (NOTE)     The eGFR has been calculated using the CKD EPI equation.     This calculation has not been validated in all clinical situations.     eGFR's persistently <90 mL/min signify possible Chronic Kidney     Disease.     HEENT: normal Cardio: RRR and no murmurs Resp: CTA B/L and unlabored GI: BS positive and non tender, nondistended Extremity:  Pulses positive and No Edema Skin:   Intact Neuro: Alert/Oriented, Flat, Cranial Nerve Abnormalities Right central 7, Normal Sensory,  Abnormal Motor Trace right shoulder adduction, trace right hip extension and Dysarthric Musc/Skel:  Normal and Other No shoulder pain with range of motion General no acute distress   Assessment/Plan: 1. Functional deficits secondary to Left subcortical infarct with severe right hemiplegia which require 3+ hours per day of interdisciplinary therapy in a comprehensive inpatient rehab setting. Physiatrist is providing close team supervision and 24 hour management of active medical problems listed below. Physiatrist and rehab team continue to assess barriers to discharge/monitor patient progress toward functional and medical goals. FIM: FIM - Bathing Bathing Steps Patient Completed: Chest;Right Arm;Abdomen;Right upper leg;Left upper leg Bathing: 3: Mod-Patient completes 5-7 1f 10 parts or 50-74%  FIM - Upper Body Dressing/Undressing Upper body dressing/undressing steps patient completed: Put head through opening of pull over shirt/dress;Thread/unthread left sleeve of pullover shirt/dress;Pull shirt over trunk Upper body dressing/undressing: 4: Min-Patient completed 75 plus % of tasks FIM - Lower Body Dressing/Undressing Lower body dressing/undressing steps patient completed: Thread/unthread left pants leg;Don/Doff left sock Lower body dressing/undressing: 2: Max-Patient completed 25-49% of tasks  FIM - Toileting Toileting steps completed by patient: Performs perineal hygiene Toileting: 2: Max-Patient completed 1 of 3 steps     FIM - Bed/Chair Transfer Bed/Chair Transfer Assistive Devices: Arm rests Bed/Chair Transfer: 5: Supine > Sit: Supervision (verbal cues/safety issues);5: Sit > Supine: Supervision (verbal cues/safety issues);2: Bed > Chair or W/C: Max A (lift and lower assist);2: Chair or W/C > Bed: Max A (lift and lower assist)  FIM - Locomotion: Wheelchair Distance: 100 Locomotion: Wheelchair: 2: Travels 50 - 149 ft with minimal assistance (Pt.>75%) FIM - Locomotion:  Ambulation Locomotion: Ambulation Assistive Devices: Walker - Hemi (L handrail and hemi walker) Ambulation/Gait Assistance: 1: +2 Total assist Locomotion: Ambulation: 1: Two helpers  Comprehension Comprehension Mode: Auditory Comprehension: 6-Follows complex conversation/direction: With extra time/assistive device  Expression Expression Mode: Verbal Expression: 5-Expresses complex 90%  of the time/cues < 10% of the time  Social Interaction Social Interaction: 5-Interacts appropriately 90% of the time - Needs monitoring or encouragement for participation or interaction.  Problem Solving Problem Solving: 6-Solves complex problems: With extra time  Memory Memory: 6-More than reasonable amt of time  Medical Problem List and Plan:  1. Thrombotic left subcortical white matter infarct  2. DVT Prophylaxis/Anticoagulation: Subcutaneous heparin. Monitor platelet counts any signs of bleeding  3. Pain Management: Hydrocodone as needed. Monitor with increased mobility  4. Mood/anxiety. Ativan 0.5 mg every 6 hours as needed.  5. Neuropsych: This patient is capable of making decisions on his own behalf.  6. Hypertension. Norvasc 10 mg daily, Coreg 12.5 mg twice a day. Monitor with increased mobility  7. History of gout. Continue colchicine as prior to admission. No current pain in the right knee  8. Hyperlipidemia. Lipitor    LOS (Days) 2 A FACE TO FACE EVALUATION WAS PERFORMED  KIRSTEINS,ANDREW E 02/10/2013, 6:24 AM

## 2013-02-10 NOTE — IPOC Note (Signed)
Overall Plan of Care [facility]) Patient Details Name: Bruce Guerrero MRN: [id number] DOB: 08/29/1960  Admitting Diagnosis: CVA  Hospital Problems: Active Problems:   CVA (cerebral infarction)     Functional Problem List: Nursing Endurance;Motor;Safety;Pain  PT Balance;Motor;Endurance  OT Balance;Motor;Safety;Sensory  SLP Linguistic  TR         Basic ADL's: OT Eating;Grooming;Bathing;Dressing;Toileting     Advanced  ADL's: OT Simple Meal Preparation     Transfers: PT Bed Mobility;Bed to Chair;Car;Furniture  OT Toilet;Tub/Shower     Locomotion: PT Ambulation;Wheelchair Mobility;Stairs     Additional Impairments: OT Fuctional Use of Upper Extremity  SLP Communication expression    TR      Anticipated Outcomes Item Anticipated Outcome  Self Feeding modified independent  Swallowing      Basic self-care  supervision  Toileting  supervision   Bathroom Transfers min assist  Bowel/Bladder  Continent of bowel and bladder  Transfers  min assist  Locomotion  min assist  Communication  Mod I   Cognition     Pain  </=3  Safety/Judgment  No falls with injury   Therapy Plan: PT Intensity: Minimum of 1-2 x/day ,45 to 90 minutes PT Frequency: 5 out of 7 days PT Duration Estimated Length of Stay: 21 days OT Intensity: Minimum of 1-2 x/day, 45 to 90 minutes OT Frequency: 5 out of 7 days OT Duration/Estimated Length of Stay: 21 days SLP Intensity: Minumum of 1-2 x/day, 30 to 90 minutes SLP Frequency: 3 out of 7 days SLP Duration/Estimated Length of Stay: ~1 week for SLP; defer CIR LOS to OT/PT       Team Interventions: Nursing Interventions Patient/Family Education;Bladder Management;Bowel Management;Disease Management/Prevention;Pain Management;Skin Care/Wound Management;Psychosocial Support  PT interventions Ambulation/gait training;Balance/vestibular training;Community reintegration;Discharge planning;DME/adaptive equipment instruction;Functional  electrical stimulation;Functional mobility training;Neuromuscular re-education;Patient/family education;Splinting/orthotics;Stair training;Therapeutic Activities;Therapeutic Exercise;UE/LE Strength taining/ROM;UE/LE Coordination activities;Wheelchair propulsion/positioning  OT Interventions Balance/vestibular training;Community reintegration;Discharge planning;Functional mobility training;Functional electrical stimulation;DME/adaptive equipment instruction;Neuromuscular re-education;Patient/family education;Pain management;Therapeutic Activities;Psychosocial support;Splinting/orthotics;UE/LE Coordination activities;UE/LE Strength taining/ROM;Self Care/advanced ADL retraining;Therapeutic Exercise  SLP Interventions Cueing hierarchy;Environmental controls;Functional tasks;Internal/external aids;Oral motor exercises;Patient/family education;Therapeutic Activities  TR Interventions    SW/CM Interventions      Team Discharge Planning: Destination: PT-Home ,OT- Home , SLP-Home Projected Follow-up: PT-Home health PT;Outpatient PT;24 hour supervision/assistance (pending progress), OT-  Outpatient OT, SLP-None Projected Equipment Needs: PT-Wheelchair (measurements);Wheelchair cushion (measurements);To be determined, OT- 3 in 1 bedside comode;Tub/shower bench, SLP-None recommended by SLP Equipment Details: PT- , OT-  Patient/family involved in discharge planning: PT- Patient;Family member/caregiver,  OT-Patient, SLP-Patient  MD ELOS: 3wks Medical Rehab Prognosis:  Good Assessment: 52 y.o. right-handed male with history of hypertension. Patient was independent prior to admission working full time. Admitted 02/01/2013 with right-sided weakness and slurred speech. MRI of the brain showed a moderately enlarged deep white matter infarction on the left. MRA of the head with no large vessel occlusion identified. Carotid Dopplers with no ICA stenosis. Echocardiogram with ejection fraction of 60% grade 1 diastolic  dysfunction. Patient did not receive TPA. Neurology services consulted presently maintained on Plavix for CVA prophylaxis with the addition of subcutaneous heparin for DVT prophylaxis  Now requiring 24/7 Rehab RN,MD, as well as CIR level PT, OT and SLP.  Treatment team will focus on ADLs and mobility with goals set at sup/minA   See Team Conference Notes for weekly updates to the plan of care

## 2013-02-10 NOTE — Progress Notes (Signed)
I have read and agree with treatment note.  Becky Ameli Sangiovanni, PT 

## 2013-02-10 NOTE — Progress Notes (Signed)
Social Work Patient ID: Bruce Guerrero, male   DOB: 1960/03/29, 52 y.o.   MRN: [id number]  CSW met with pt on 02-09-13 to complete assessment and to update him on team conference discussion. Explained that wednesdays are team conference days and that the team meets all together to discuss pt's goals and that is was determined that pt could be d/c'd on 03-01-13.  Pt was pleased to have a targeted d/c date.  Also talked to pt about needing some minimal physical assistance and he expressed understanding and that his son who lives with him will be there 24/7 with him.  Pt has a good awareness of his limitations.  He does not have cognitive or swallowing concerns.  Pt is hard working and motivated and CSW encouraged him to keep that up because that will help him a lot in rehab.

## 2013-02-10 NOTE — Progress Notes (Signed)
Physical Therapy Session Note  Patient Details  Name: Bruce Guerrero MRN: [id number] Date of Birth: 1960-11-28  Today's Date: 02/10/2013 Time: 7829-5621 Time Calculation (min): 61 min  Short Term Goals: Week 1:  PT Short Term Goal 1 (Week 1): Pt will perform dynamic standing balance with mod assist PT Short Term Goal 2 (Week 1): Pt will perform stand pivot transfers R and L at mod assist with safe technique PT Short Term Goal 3 (Week 1): Pt will perform w/c mobility x 150' at supervision level using L hemi technique PT Short Term Goal 4 (Week 1): Pt will ambulate x 50' w/ LRAD at max assist of one person  Skilled Therapeutic Interventions/Progress Updates:    Pt received seated in w/c with son and brother present. Pt propelled w/c to therapy gym 150 ft using hemi technique supervision. Pt transferred w/c>mat stand pivot with max A. Pt performed sit>supine supervision, demonstrating good use of compensatory techniques for RLE but required VC for safe placement of RUE. Performed supine NMR including BLE exercise (dorsiflexion, plantarflexion, heel slides, quad set) x10 providing assist/PROM on RLE and LLE performing movement actively. Pt performed active horizontal adduction with RUE x5 to assist with rolling L. Pt performed rolling left and left side lying>sit supervision. Performed sit<>partial stand (lifting hips off mat) 2x10 with manual facilitation for weight shift to R, weight bearing through RLE, and eccentric quad control. Pt performed sit<>stand x10 on kinetron for visual and tactile feedback for equal weight bearing through BLE. Pt demonstrated improved weight bearing through R and quadricep activation as activity progressed. Pt ambulated 25 ft in hallway using L rail with max A. Gait details below. Returned pt to room in w/c, pt transferred w/c>recliner max A. Pt left seated in recliner with needs in reach and brother present.   Therapy Documentation Precautions:   Precautions Precautions: Fall Precaution Comments: right hemiparesis Restrictions Weight Bearing Restrictions: No Pain: Pain Assessment Pain Assessment: No/denies pain Mobility: Bed Mobility Bed Mobility: Rolling Left;Supine to Sit;Sit to Supine Rolling Left: 5: Supervision Supine to Sit: 5: Supervision Sit to Supine: 5: Supervision Transfers Transfers: Yes Sit to Stand: 2: Max assist;With upper extremity assist Stand to Sit: 2: Max assist;With upper extremity assist Stand Pivot Transfers: 2: Max Actuary Details: Verbal cues for sequencing;Manual facilitation for weight bearing;Manual facilitation for placement;Manual facilitation for weight shifting Locomotion : Ambulation Ambulation: Yes Ambulation/Gait Assistance: 2: Max assist Ambulation Distance (Feet): 25 Feet Assistive device:  (L rail) Ambulation/Gait Assistance Details: Tactile cues for initiation;Verbal cues for sequencing;Verbal cues for gait pattern;Manual facilitation for weight shifting;Manual facilitation for placement;Manual facilitation for weight bearing Ambulation/Gait Assistance Details: Ambulation in hallway with L rail, ace wrap on R foot for DR assist. Manual facilitation for weight shift/weight bearing through R, blocking R knee to prevent buckling, and manual facilitation for upright posture and for neutral hip position during stance on R. Pt flexes trunk significantly when advancing LLE. Pt able to advance RLE with min tactile cues and verbal cues for initiation.  Gait Gait: Yes Gait Pattern: Decreased step length - left;Decreased stance time - right;Step-to pattern;Decreased stride length;Decreased dorsiflexion - right;Decreased weight shift to right;Right flexed knee in stance;Right foot flat;Trunk flexed;Narrow base of support Wheelchair Mobility Wheelchair Assistance: 5: Supervision Wheelchair Propulsion: Left upper extremity;Left lower extremity Distance: 150   See FIM for  current functional status  Therapy/Group: Individual Therapy  Charissa Knowles 02/10/2013, 11:33 AM

## 2013-02-10 NOTE — Progress Notes (Signed)
[facility] Individual Statement of Services  Patient Name:  Bruce Guerrero  Date:  02/10/2013  Welcome to the [facility].  Our goal is to provide you with an individualized program based on your diagnosis and situation, designed to meet your specific needs.  With this comprehensive rehabilitation program, you will be expected to participate in at least 3 hours of rehabilitation therapies Monday-Friday, with modified therapy programming on the weekends.  Your rehabilitation program will include the following services:  Physical Therapy (PT), Occupational Therapy (OT), Speech Therapy (ST), 24 hour per day rehabilitation nursing, Therapeutic Recreation (TR), Neuropsychology, Case Management (Social Worker), Rehabilitation Medicine, Nutrition Services and Pharmacy Services  Weekly team conferences will be held on Wednesdays to discuss your progress.  Your Social Worker will talk with you frequently to get your input and to update you on team discussions.  Team conferences with you and your family in attendance may also be held.  Expected length of stay:  3 weeks  Overall anticipated outcome:  Minimal Assistance  Depending on your progress and recovery, your program may change. Your Social Worker will coordinate services and will keep you informed of any changes. Your Social Worker's name and contact numbers are listed  below.  The following services may also be recommended but are not provided by the [facility]:   Driving Evaluations  Home Health Rehabiltiation Services  Outpatient Rehabilitation Services  Vocational Rehabilitation   Arrangements will be made to provide these services after discharge if needed.  Arrangements include referral to agencies that provide these services.  Your insurance has been verified to be:  [employer] Your primary doctor is:  Dr. Rudi Heap  Pertinent information will be shared  with your doctor and your insurance company.  Social Worker:  Staci Acosta, LCSW  [phone] or ([phone]  Information discussed with and copy given to patient by: Elvera Lennox, 02/10/2013, 11:14 AM

## 2013-02-10 NOTE — Progress Notes (Signed)
Speech Language Pathology Daily Session Note  Patient Details  Name: Bruce Guerrero MRN: [id number] Date of Birth: Sep 26, 1960  Today's Date: 02/10/2013 Time: 7829-5621 Time Calculation (min): 30 min  Short Term Goals: Week 2:    Skilled Therapeutic Interventions: Skilled treatment session focused on addressing speech intelligibility goals.  SLP facilitated session with discussion regarding effective strategies to increase intelligibility and patient was able to identify 1 independently and required question cues for the second.  Additionally, patient self-corrected errors in 1/3 opportunities.  Audio recorder was used as feedback for patient to hear his own intelligibility.  Recommend follow up x1 next week then discharge.   FIM:  Social Interaction Social Interaction: 6-Interacts appropriately with others with medication or extra time (anti-anxiety, antidepressant). Problem Solving Problem Solving: 6-Solves complex problems: With extra time Memory Memory: 5-Requires cues to use assistive device  Pain Pain Assessment Pain Assessment: No/denies pain  Therapy/Group: Individual Therapy  Charlane Ferretti., CCC-SLP [phone]  [name] 02/10/2013, 11:21 AM

## 2013-02-10 NOTE — Progress Notes (Signed)
Occupational Therapy Session Note  Patient Details  Name: Bruce Guerrero MRN: [id number] Date of Birth: [birth year]  Today's Date: 02/10/2013 Time: 0731-0828 Time Calculation (min): 57 min  Short Term Goals: Week 1:  OT Short Term Goal 1 (Week 1): Pt will perform all bathing LB bathing with mod assist including sit to stand. OT Short Term Goal 2 (Week 1): Pt will donn pullover shirt with supervision 2/3 sessions. OT Short Term Goal 3 (Week 1): Pt will perform LB dressing with mod assist sit to stand, with min instructional cueing for hemi dressing techniques. OT Short Term Goal 4 (Week 1): Pt will perform stand pivot transfer to the 3:1 with mod assist. OT Short Term Goal 5 (Week 1): Pt will perform self AAROM/PROM exercises for the RUE with supervision following handout.  Skilled Therapeutic Interventions/Progress Updates:  Pt seen for ADL retraining with focus on bathing and dressing. Pt sitting EOB upon arrival with son Bruce Guerrero present. Pt required Mod assist stand pivot EOB > w/c. Pt verbalized wanting to take shower this session. Transferred w/c <> shower stand pivot with  Mod assist with use of grab bars. Pt completed bathing in shower at sit > stand level requiring Mod assist to stand to wash buttocks. Pt able to wash feet this session by bending forward however required verbal cues to not hit head against grab bar. Educated pt on using hemi technique for washing left arm, pt then demonstrated. Completed dressing at sink sit > stand level. Pt able to don undershirt and polo with supervision this session with verbal cues to thread right arm first. Pt able to thread left leg in pants this session however had difficulty threading right leg. Educated pt on crossing right leg over left to don pants, however pt could not. Provided stool for pt to place right foot to assist in threading right leg however pt still unable to at this time. Required Min-Mod assist to stand to pull up pants with  assistance to pull up right side. Educated pt on donning socks using hemi technique, pt able to don left sock however had difficulty donning right sock. Pt became very frustrated and stated he had no patience. Therapist encouraged pt and discussed trying to slow down. Therapist then demonstrated donning sock using hemi technique, pt stated he would continue to practice. Pt donned left shoe this session, however needed assistance for donning right shoe. Introduced shoe buttons to pt and educated on fastening shoes. Pt demonstrated fastening with use of shoe buttons. Pt left in w/c in room with son and brother. Educated on not standing without assistance due to fall risk and decreased balance.   Therapy Documentation Precautions:  Precautions Precautions: Fall Precaution Comments: right hemiparesis Restrictions Weight Bearing Restrictions: No Pain: Pain Assessment Pain Assessment: No/denies pain    See FIM for current functional status  Therapy/Group: Individual Therapy  Donna Christen 02/10/2013, 11:42 AM

## 2013-02-10 NOTE — Progress Notes (Signed)
Social Work Assessment and Plan  Patient Details  Name: Bruce Guerrero MRN: [id number] Date of Birth: Feb 21, 1961  Today's Date: 02/10/2013  Problem List:  Patient Active Problem List   Diagnosis Date Noted  . CVA (cerebral infarction) 02/01/2013  . Gout 08/05/2012  . HTN (hypertension) 08/05/2012   Past Medical History:  Past Medical History  Diagnosis Date  . Gout   . Hypertension    Past Surgical History: History reviewed. No pertinent past surgical history. Social History:  reports that he has never smoked. He does not have any smokeless tobacco history on file. He reports that he does not drink alcohol or use illicit drugs.  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  CSW encouraged him to check with employer about any short tem disability for which he may be eligible. Legal Hisotry/Current Legal Issues: None Guardian/Conservator: N/A   Abuse/Neglect Physical Abuse: Denies Verbal Abuse: Denies Sexual Abuse: Denies Exploitation of patient/patient's resources: Denies Self-Neglect: Denies  Emotional Status Pt's affect, behavior adn adjustment status: Pt is appropriately frustrated with his condition post stroke.  Otherwise, he is motivated and hard working and wants to get better. 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  She has paid caregivers during the day, 5 days a week, and familly is with her at the other times. Pyschiatric History: None reported Substance Abuse History: None reported  Patient / Family Perceptions, Expectations & Goals Pt/Family understanding of illness & functional limitations: Pt feels he has a good understanding of his condition and limitations and has received good information from the physicians. Premorbid pt/family roles/activities: Pt worked 80 hours a week and then helped care for his mother.  Liked to watch TV if he has any spare time. Anticipated changes in roles/activities/participation: Pt's ability to work and care for his mother. Pt/family expectations/goals: "I want to get stronger.  I want to have more use of my right arm.  And I want to know it's going to keep getting better."  [employer]: None Premorbid Home Care/DME Agencies: None Transportation available at discharge: family Resource referrals recommended: Neuropsychology;Support group (specify)  Discharge Planning Living Arrangements: Children;Other relatives (brother - works during the day in [place]) Support Systems: Children;Other relatives;Friends/neighbors Type of Residence: Private residence Insurance Resources: Media planner (specify) (Blue Cross Blue Shield) Financial Resources: Employment Financial Screen Referred: No Living Expenses: Psychologist, sport and exercise Management: Patient Does the patient have any problems obtaining your medications?: No Home Management: Pt was doing this.  Son living with him while out of work.  Patient/Family Preliminary Plans: Son will be with pt to assist 24/7. Barriers to Discharge: Steps Social Work Anticipated Follow Up Needs: HH/OP;Support Group Expected length of stay: 3 weeks  Clinical Impression CSW met with pt while his friend was present (with pt's permission) to complete assessment and  introduce self and role.  Pt is very motivated to regain his strength and movement in his right side and  has good family support.  Pt has been a caregiver to his mother, as well as working 80 hours/ week.  Pt feels he may have brought his medical problems on himself with how hard he has been working and all the stress he's put him self under.  CSW briefly talked with pt about any changes he can make going forward and he has some ideas.  Pt clearly expressed some frustration with himself and his situation.  CSW provided supportive listening and told him we would talk about this more and CSW will continue to check on his mood.  Pt's friend stated that they will all be keeping an eye on him and how he is coping with his condition.  Pt was hopeful for recovery and wants to be reminded of progress and hope that things can continue to improve.  Pt's family plans to provide assistance to pt 24/7 and pt expressed the importance of that.  CSW encouraged pt to check with Human Resources to determine if he has short term disability available to him.  Pt was appreciative of CSW visit.  CSW will continue to follow and assist pt as needed.  Bruce Guerrero, [place] Deck 02/10/2013, 11:32 AM

## 2013-02-11 ENCOUNTER — Encounter (HOSPITAL_COMMUNITY): Payer: BC Managed Care – PPO | Admitting: Occupational Therapy

## 2013-02-11 ENCOUNTER — Inpatient Hospital Stay (HOSPITAL_COMMUNITY): Payer: BC Managed Care – PPO | Admitting: Occupational Therapy

## 2013-02-11 ENCOUNTER — Inpatient Hospital Stay (HOSPITAL_COMMUNITY): Payer: BC Managed Care – PPO | Admitting: Physical Therapy

## 2013-02-11 ENCOUNTER — Inpatient Hospital Stay (HOSPITAL_COMMUNITY): Payer: BC Managed Care – PPO

## 2013-02-11 DIAGNOSIS — I6330 Cerebral infarction due to thrombosis of unspecified cerebral artery: Secondary | ICD-10-CM

## 2013-02-11 DIAGNOSIS — G8110 Spastic hemiplegia affecting unspecified side: Secondary | ICD-10-CM

## 2013-02-11 MED ORDER — MENTHOL 3 MG MT LOZG
1.0000 | LOZENGE | OROMUCOSAL | Status: DC | PRN
  Filled 2013-02-11 (×2): qty 9

## 2013-02-11 NOTE — Progress Notes (Signed)
I have reviewed and I agree with the following treatment note.  Audra Hall, PT, DPT  

## 2013-02-11 NOTE — Patient Care Conference (Signed)
Inpatient RehabilitationTeam Conference and Plan of Care Update Date: 02/09/2013   Time: 11:45 AM    Patient Name: Bruce Guerrero      Medical Record Number: [id number]  Date of Birth: Mar 11, 1960 Sex: Male         Room/Bed: 4W01C/4W01C-01 Payor Info: Payor: BLUE CROSS BLUE SHIELD / Plan: BCBS PPO OUT OF STATE / Product Type: *No Product type* /    Admitting Diagnosis: CVA  Admit Date/Time:  02/08/2013  4:56 PM Admission Comments: No comment available   Primary Diagnosis:  <principal problem not specified> Principal Problem: <principal problem not specified>  Patient Active Problem List   Diagnosis Date Noted  . CVA (cerebral infarction) 02/01/2013  . Gout 08/05/2012  . HTN (hypertension) 08/05/2012    Expected Discharge Date: Expected Discharge Date: 03/01/13  Team Members Present: Physician leading conference: Dr. Claudette Laws Social Worker Present: Dossie Der, LCSW;Jenny Dejanique Ruehl, LCSW Nurse Present: Other (comment) Andi Devon, RN) PT Present: Edman Circle, PT;Emily Marya Amsler, PT OT Present: Rosalio Loud, OT;Kris Gellert, Heath Lark, OT SLP Present: Fae Pippin, SLP PPS Coordinator present : Tora Duck, RN, CRRN     Current Status/Progress Goal Weekly Team Focus  Medical   severe R HP, no cognitive prob, dysarthria  maximize fxnl status for home d/c  initiate therapy prog   Bowel/Bladder   Continent of bowel and bladder; LBM 12/2  Continent of bowel and bladder  Remain continent with routine toileting   Swallow/Nutrition/ Hydration             ADL's   Pt is currenlty min assist level for UB selfcare and max assist for LB selfcare.  Max assist also needed for stand pivot transfers.    Pt with Brunnstum stage II movement in the right arm and hand.    min assist for bathing, supervision dressing, min assist toileting and toilet transfers  selfcare retraining, transfer training, neuromuscular re-education, pt/family education   Mobility   Currently  supervision for bed mobility, max assist for stand pivot transfers, max assist for standing, gait and stair negotiation  min assist overall  NMR for RUE/LE, transfers, standing balance, gait, pt/family education   Communication   eval pending         Safety/Cognition/ Behavioral Observations            Pain   Denies  </=3  Monitor for signs/symptoms of pain   Skin   Bruising to abdomen  No new skin breakdown  Ensure patient turns self q2hr in bed and boosts self in chair    Rehab Goals Patient on target to meet rehab goals: Yes Rehab Goals Revised: Pt just evaluated day of conference *See Care Plan and progress notes for long and short-term goals.  Barriers to Discharge: physical assist for all ADL and mb    Possible Resolutions to Barriers:  see above    Discharge Planning/Teaching Needs:  Pt plans to return to his home with his sons and brother to stay with him.  Sons and brother will need to come in for family education closer to d/c.   Team Discussion:  Pt with dense right hemiparesis, upper and lower.  Pt has some dysarthria, but good awareness and no swallowing issues.  Pt transfers with family in the room.  He has 2 sons and a brother who visit often.  Pt is very motivated and has min assist level goals or higher.  Prior to his stroke, pt was working at [employer] about 80 hours/ week  for the last 30 years.  Revisions to Treatment Plan:  Pt just being evaluated.   Continued Need for Acute Rehabilitation Level of Care: The patient requires daily medical management by a physician with specialized training in physical medicine and rehabilitation for the following conditions: Daily direction of a multidisciplinary physical rehabilitation program to ensure safe treatment while eliciting the highest outcome that is of practical value to the patient.: Yes Daily medical management of patient stability for increased activity during participation in an intensive rehabilitation regime.:  Yes Daily analysis of laboratory values and/or radiology reports with any subsequent need for medication adjustment of medical intervention for : Neurological problems  [name], [place]ck 02/11/2013, 10:04 AM

## 2013-02-11 NOTE — Progress Notes (Signed)
Physical Therapy Session Note  Patient Details  Name: Bruce Guerrero MRN: [id number] Date of Birth: 08/30/1960  Today's Date: 02/11/2013 Time: 1000-1030 Time Calculation (min): 30 min  Short Term Goals: Week 1:  PT Short Term Goal 1 (Week 1): Pt will perform dynamic standing balance with mod assist PT Short Term Goal 2 (Week 1): Pt will perform stand pivot transfers R and L at mod assist with safe technique PT Short Term Goal 3 (Week 1): Pt will perform w/c mobility x 150' at supervision level using L hemi technique PT Short Term Goal 4 (Week 1): Pt will ambulate x 50' w/ LRAD at max assist of one person  Skilled Therapeutic Interventions/Progress Updates:    Pt received seated in recliner with brother present. Pt transferred recliner>w/c stand pivot max A. Pt propelled w/c using hemi technique 150 ft to therapy gym supervision. Pt transferred w/c>mat stand pivot max A. Pt performed sit<>stand x10 with manual facilitation and verbal cues for weight shift and weight bearing through RLE. Pt demonstrated improved placement of RLE prior to standing this session and able to self correct positioning of feet. Engaged pt in standing at table with manual facilitation for weight bearing through RLE and RUE. Performed weight shifting and reaching R while playing checkers. Pt stood x7 minutes and x5 minutes before requiring rest break. Pt left with supervising PT to complete therapy session.   Therapy Documentation Precautions:  Precautions Precautions: Fall Precaution Comments: right hemiparesis Restrictions Weight Bearing Restrictions: No   Pain: Pain Assessment Pain Assessment: No/denies pain Pain Score: 0-No pain  See FIM for current functional status  Therapy/Group: Individual Therapy  Abdulrahman Bracey 02/11/2013, 12:30 PM

## 2013-02-11 NOTE — IPOC Note (Signed)
Overall Plan of Care [facility]) Patient Details Name: Bruce Guerrero MRN: [id number] DOB: 1960-04-22  Admitting Diagnosis: CVA  Hospital Problems: Active Problems:   CVA (cerebral infarction)     Functional Problem List: Nursing Endurance;Motor;Safety;Pain  PT Balance;Motor;Endurance  OT Balance;Motor;Safety;Sensory  SLP Linguistic  TR         Basic ADL's: OT Eating;Grooming;Bathing;Dressing;Toileting     Advanced  ADL's: OT Simple Meal Preparation     Transfers: PT Bed Mobility;Bed to Chair;Car;Furniture  OT Toilet;Tub/Shower     Locomotion: PT Ambulation;Wheelchair Mobility;Stairs     Additional Impairments: OT Fuctional Use of Upper Extremity  SLP Communication expression    TR      Anticipated Outcomes Item Anticipated Outcome  Self Feeding modified independent  Swallowing      Basic self-care  supervision  Toileting  supervision   Bathroom Transfers min assist  Bowel/Bladder  Continent of bowel and bladder  Transfers  min assist  Locomotion  min assist  Communication  Mod I   Cognition     Pain  </=3  Safety/Judgment  No falls with injury   Therapy Plan: PT Intensity: Minimum of 1-2 x/day ,45 to 90 minutes PT Frequency: 5 out of 7 days PT Duration Estimated Length of Stay: 21 days OT Intensity: Minimum of 1-2 x/day, 45 to 90 minutes OT Frequency: 5 out of 7 days OT Duration/Estimated Length of Stay: 21 days SLP Intensity: Minumum of 1-2 x/day, 30 to 90 minutes SLP Frequency: 3 out of 7 days SLP Duration/Estimated Length of Stay: ~1 week for SLP; defer CIR LOS to OT/PT       Team Interventions: Nursing Interventions Patient/Family Education;Bladder Management;Bowel Management;Disease Management/Prevention;Pain Management;Skin Care/Wound Management;Psychosocial Support  PT interventions Ambulation/gait training;Balance/vestibular training;Community reintegration;Discharge planning;DME/adaptive equipment instruction;Functional  electrical stimulation;Functional mobility training;Neuromuscular re-education;Patient/family education;Splinting/orthotics;Stair training;Therapeutic Activities;Therapeutic Exercise;UE/LE Strength taining/ROM;UE/LE Coordination activities;Wheelchair propulsion/positioning  OT Interventions Balance/vestibular training;Community reintegration;Discharge planning;Functional mobility training;Functional electrical stimulation;DME/adaptive equipment instruction;Neuromuscular re-education;Patient/family education;Pain management;Therapeutic Activities;Psychosocial support;Splinting/orthotics;UE/LE Coordination activities;UE/LE Strength taining/ROM;Self Care/advanced ADL retraining;Therapeutic Exercise  SLP Interventions Cueing hierarchy;Environmental controls;Functional tasks;Internal/external aids;Oral motor exercises;Patient/family education;Therapeutic Activities  TR Interventions    SW/CM Interventions Discharge Planning;Psychosocial Support;Patient/Family Education    Team Discharge Planning: Destination: PT-Home ,OT- Home , SLP-Home Projected Follow-up: PT-Home health PT;Outpatient PT;24 hour supervision/assistance (pending progress), OT-  Outpatient OT, SLP-None Projected Equipment Needs: PT-Wheelchair (measurements);Wheelchair cushion (measurements);To be determined, OT- 3 in 1 bedside comode;Tub/shower bench, SLP-None recommended by SLP Equipment Details: PT- , OT-  Patient/family involved in discharge planning: PT- Patient;Family member/caregiver,  OT-Patient, SLP-Patient  MD ELOS: 20 days Medical Rehab Prognosis:  Excellent Assessment: The patient has been admitted for CIR therapies. The team will be addressing, functional mobility, strength, stamina, balance, safety, adaptive techniques/equipment, self-care, bowel and bladder mgt, patient and caregiver education, NMR, cognition, communication. Goals have been set at supervision to min assist for mobility/locomotion and self-care, mod I with  communication.    Ranelle Oyster, MD, FAAPMR      See Team Conference Notes for weekly updates to the plan of care

## 2013-02-11 NOTE — Progress Notes (Signed)
52 y.o. right-handed male with history of hypertension. Patient was independent prior to admission working full time. Admitted 02/01/2013 with right-sided weakness and slurred speech. MRI of the brain showed a moderately enlarged deep white matter infarction on the left. MRA of the head with no large vessel occlusion identified. Carotid Dopplers with no ICA stenosis. Echocardiogram with ejection fraction of 60% grade 1 diastolic dysfunction. Patient did not receive TPA. Neurology services consulted presently maintained on Plavix for CVA prophylaxis with the addition of subcutaneous heparin for DVT prophylaxis. He is tolerating a regular diet  Subjective/Complaints: No pain c/os  Review of systems: Negative except for weakness on the right side  Objective: Vital Signs: Blood pressure 126/74, pulse 56, temperature 97.7 F (36.5 C), temperature source Oral, resp. rate 17, height 5\' 5"  (1.651 m), weight 82.101 kg (181 lb), SpO2 95.00%. No results found. Results for orders placed during the hospital encounter of 02/08/13 (from the past 72 hour(s))  CBC     Status: None   Collection Time    02/08/13  6:36 PM      Result Value Range   WBC 8.6  4.0 - 10.5 K/uL   RBC 4.94  4.22 - 5.81 MIL/uL   Hemoglobin 16.5  13.0 - 17.0 g/dL   HCT 16.1  09.6 - 04.5 %   MCV 94.5  78.0 - 100.0 fL   MCH 33.4  26.0 - 34.0 pg   MCHC 35.3  30.0 - 36.0 g/dL   RDW 40.9  81.1 - 91.4 %   Platelets 230  150 - 400 K/uL  CREATININE, SERUM     Status: Abnormal   Collection Time    02/08/13  6:36 PM      Result Value Range   Creatinine, Ser 1.33  0.50 - 1.35 mg/dL   GFR calc non Af Amer 60 (*) >90 mL/min   GFR calc Af Amer 70 (*) >90 mL/min   Comment: (NOTE)     The eGFR has been calculated using the CKD EPI equation.     This calculation has not been validated in all clinical situations.     eGFR's persistently <90 mL/min signify possible Chronic Kidney     Disease.  CBC WITH DIFFERENTIAL     Status: None    Collection Time    02/09/13  7:01 AM      Result Value Range   WBC 8.1  4.0 - 10.5 K/uL   RBC 4.83  4.22 - 5.81 MIL/uL   Hemoglobin 16.1  13.0 - 17.0 g/dL   HCT 78.2  95.6 - 21.3 %   MCV 94.8  78.0 - 100.0 fL   MCH 33.3  26.0 - 34.0 pg   MCHC 35.2  30.0 - 36.0 g/dL   RDW 08.6  57.8 - 46.9 %   Platelets 239  150 - 400 K/uL   Neutrophils Relative % 65  43 - 77 %   Neutro Abs 5.3  1.7 - 7.7 K/uL   Lymphocytes Relative 26  12 - 46 %   Lymphs Abs 2.1  0.7 - 4.0 K/uL   Monocytes Relative 7  3 - 12 %   Monocytes Absolute 0.6  0.1 - 1.0 K/uL   Eosinophils Relative 1  0 - 5 %   Eosinophils Absolute 0.1  0.0 - 0.7 K/uL   Basophils Relative 0  0 - 1 %   Basophils Absolute 0.0  0.0 - 0.1 K/uL  COMPREHENSIVE METABOLIC PANEL     Status:  Abnormal   Collection Time    02/09/13  7:01 AM      Result Value Range   Sodium 140  135 - 145 mEq/L   Potassium 4.4  3.5 - 5.1 mEq/L   Chloride 100  96 - 112 mEq/L   CO2 29  19 - 32 mEq/L   Glucose, Bld 126 (*) 70 - 99 mg/dL   BUN 24 (*) 6 - 23 mg/dL   Creatinine, Ser 4.78  0.50 - 1.35 mg/dL   Calcium 29.5  8.4 - 62.1 mg/dL   Total Protein 7.9  6.0 - 8.3 g/dL   Albumin 4.0  3.5 - 5.2 g/dL   AST 84 (*) 0 - 37 U/L   ALT 98 (*) 0 - 53 U/L   Alkaline Phosphatase 107  39 - 117 U/L   Total Bilirubin 0.8  0.3 - 1.2 mg/dL   GFR calc non Af Amer 63 (*) >90 mL/min   GFR calc Af Amer 73 (*) >90 mL/min   Comment: (NOTE)     The eGFR has been calculated using the CKD EPI equation.     This calculation has not been validated in all clinical situations.     eGFR's persistently <90 mL/min signify possible Chronic Kidney     Disease.     HEENT: normal Cardio: RRR and no murmurs Resp: CTA B/L and unlabored GI: BS positive and non tender, nondistended Extremity:  Pulses positive and No Edema Skin:   Intact Neuro: Alert/Oriented, Flat, Cranial Nerve Abnormalities Right central 7, Normal Sensory, Abnormal Motor Trace right shoulder adduction, trace right hip  extension and Dysarthric, 5/5 on Left side Musc/Skel:  Normal and Other No shoulder pain with range of motion General no acute distress   Assessment/Plan: 1. Functional deficits secondary to Left subcortical infarct with severe right hemiplegia which require 3+ hours per day of interdisciplinary therapy in a comprehensive inpatient rehab setting. Physiatrist is providing close team supervision and 24 hour management of active medical problems listed below. Physiatrist and rehab team continue to assess barriers to discharge/monitor patient progress toward functional and medical goals. FIM: FIM - Bathing Bathing Steps Patient Completed: Chest;Right upper leg;Right Arm;Left upper leg;Left Arm;Right lower leg (including foot);Abdomen;Left lower leg (including foot);Front perineal area;Buttocks Bathing: 3: Mod-Patient completes 5-7 51f 10 parts or 50-74%  FIM - Upper Body Dressing/Undressing Upper body dressing/undressing steps patient completed: Put head through opening of pull over shirt/dress;Thread/unthread left sleeve of pullover shirt/dress;Pull shirt over trunk;Thread/unthread right sleeve of pullover shirt/dresss Upper body dressing/undressing: 5: Set-up assist to: Obtain clothing/put away FIM - Lower Body Dressing/Undressing Lower body dressing/undressing steps patient completed: Thread/unthread left pants leg;Don/Doff left sock;Fasten/unfasten left shoe;Fasten/unfasten right shoe;Don/Doff left shoe;Thread/unthread left underwear leg Lower body dressing/undressing: 3: Mod-Patient completed 50-74% of tasks  FIM - Toileting Toileting steps completed by patient: Adjust clothing prior to toileting;Performs perineal hygiene Toileting Assistive Devices: Grab bar or rail for support Toileting: 3: Mod-Patient completed 2 of 3 steps  FIM - Diplomatic Services operational officer Devices: Grab bars;Elevated toilet seat Toilet Transfers: 2-From toilet/BSC: Max A (lift and lower assist);2-To  toilet/BSC: Max A (lift and lower assist)  FIM - Banker Devices: Arm rests (chair to chair) Bed/Chair Transfer: 3: Bed > Chair or W/C: Mod A (lift or lower assist);3: Chair or W/C > Bed: Mod A (lift or lower assist)  FIM - Locomotion: Wheelchair Distance: 150 Locomotion: Wheelchair: 5: Travels 150 ft or more: maneuvers on rugs and over door sills  with supervision, cueing or coaxing FIM - Locomotion: Ambulation Locomotion: Ambulation Assistive Devices:  (L handrail) Ambulation/Gait Assistance: 1: +2 Total assist Locomotion: Ambulation: 1: Two helpers (max assist with chair follow)  Comprehension Comprehension Mode: Auditory Comprehension: 6-Follows complex conversation/direction: With extra time/assistive device  Expression Expression Mode: Verbal Expression: 5-Expresses complex 90% of the time/cues < 10% of the time  Social Interaction Social Interaction: 6-Interacts appropriately with others with medication or extra time (anti-anxiety, antidepressant).  Problem Solving Problem Solving: 6-Solves complex problems: With extra time  Memory Memory: 5-Requires cues to use assistive device  Medical Problem List and Plan:  1. Thrombotic left subcortical white matter infarct Right hemiplegia pure motor 2. DVT Prophylaxis/Anticoagulation: Subcutaneous heparin. Monitor platelet counts any signs of bleeding  3. Pain Management: Hydrocodone as needed. Monitor with increased mobility  4. Mood/anxiety. Ativan 0.5 mg every 6 hours as needed.  5. Neuropsych: This patient is capable of making decisions on his own behalf.  6. Hypertension. Norvasc 10 mg daily, Coreg 12.5 mg twice a day. Monitor with increased mobility  7. History of gout. Continue colchicine as prior to admission. No current pain in the right knee  8. Hyperlipidemia. Lipitor    LOS (Days) 3 A FACE TO FACE EVALUATION WAS PERFORMED  KIRSTEINS,ANDREW E 02/11/2013, 8:10 AM

## 2013-02-11 NOTE — Progress Notes (Signed)
Occupational Therapy Session Note  Patient Details  Name: Bruce Guerrero MRN: [id number] Date of Birth: May 30, 1960  Today's Date: 02/11/2013 Time: 1478-2956 Time Calculation (min): 43 min  Short Term Goals: Week 1:  OT Short Term Goal 1 (Week 1): Pt will perform all bathing LB bathing with mod assist including sit to stand. OT Short Term Goal 2 (Week 1): Pt will donn pullover shirt with supervision 2/3 sessions. OT Short Term Goal 3 (Week 1): Pt will perform LB dressing with mod assist sit to stand, with min instructional cueing for hemi dressing techniques. OT Short Term Goal 4 (Week 1): Pt will perform stand pivot transfer to the 3:1 with mod assist. OT Short Term Goal 5 (Week 1): Pt will perform self AAROM/PROM exercises for the RUE with supervision following handout.  Skilled Therapeutic Interventions/Progress Updates:    Therapy session focused on NMR to RUE, sit<>stand, and functional transfers. Pt received in recliner chair and requiried mod assist for stand pivot transfer to w/c. Min assist for stand pivot transfer to L w/c>mat table. Engaged in card matching activity while weight bearing through RUE and while weightbearing in side lying propped on R elbow. Engaged in AROM and UE ranger following task with light weight added to it. Pt with active shoulder adduction and protraction/retraction. Engaged in sit<>stand x10 trials using BUE to push up from surface with therapist providing support at shoulder joint. Completed tub transfer with min assist during stand pivot and assist to manage RLE into tub. At end of session pt returned to room and left supine in bed with all needs in reach.    Therapy Documentation Precautions:  Precautions Precautions: Fall Precaution Comments: right hemiparesis Restrictions Weight Bearing Restrictions: No General:   Vital Signs:   Pain: No report of pain during therapy session.    Other Treatments:    See FIM for current functional  status  Therapy/Group: Individual Therapy  Daneil Dan 02/11/2013, 2:35 PM

## 2013-02-11 NOTE — Progress Notes (Signed)
Occupational Therapy Session Note  Patient Details  Name: BRADY SCHILLER MRN: [id number] Date of Birth: 08-07-1960  Today's Date: 02/11/2013 Time: 1500-1530 Time Calculation (min): 30 min  Short Term Goals: Week 1:  OT Short Term Goal 1 (Week 1): Pt will perform all bathing LB bathing with mod assist including sit to stand. OT Short Term Goal 2 (Week 1): Pt will donn pullover shirt with supervision 2/3 sessions. OT Short Term Goal 3 (Week 1): Pt will perform LB dressing with mod assist sit to stand, with min instructional cueing for hemi dressing techniques. OT Short Term Goal 4 (Week 1): Pt will perform stand pivot transfer to the 3:1 with mod assist. OT Short Term Goal 5 (Week 1): Pt will perform self AAROM/PROM exercises for the RUE with supervision following handout.  Skilled Therapeutic Interventions/Progress Updates:  Patient sitting EOB with brother present.  Engaged in NMR to include table top activity in standing with need to block left foot from traveling to midline secondary to overuse and balance response preference to bear weight onto LLE.  Encouraged not to use LUE for sit><stands to force equal weight through BLEs and controlled movements Neuromuscular Facilitation: Right;Upper Extremity;Lower Extremity;Forced use;Activity to increase motor control;Activity to increase sustained activation;Activity to increase lateral weight shifting RUE Weight Bearing Technique: Extended arm standing Response to Weight Bearing Technique: verbal and tactile cues required to maintain extended arm  Therapy Documentation Precautions:  Precautions Precautions: Fall Precaution Comments: right hemiparesis Restrictions Weight Bearing Restrictions: No Pain: Denies pain  Therapy/Group: Individual Therapy  Makynli Stills 02/11/2013, 5:51 PM

## 2013-02-11 NOTE — Progress Notes (Signed)
Physical Therapy Session Note  Patient Details  Name: Bruce Guerrero MRN: [id number] Date of Birth: 01/31/1961  Today's Date: 02/11/2013 Time: 1030-1058 Time Calculation (min): 28 min  Short Term Goals: Week 1:  PT Short Term Goal 1 (Week 1): Pt will perform dynamic standing balance with mod assist PT Short Term Goal 2 (Week 1): Pt will perform stand pivot transfers R and L at mod assist with safe technique PT Short Term Goal 3 (Week 1): Pt will perform w/c mobility x 150' at supervision level using L hemi technique PT Short Term Goal 4 (Week 1): Pt will ambulate x 50' w/ LRAD at max assist of one person  Skilled Therapeutic Interventions/Progress Updates:   Continued therapy session started with SPT; Continued dynamic standing balance/weight shift training at tall table with reaching forwards and to the R during checkers game with focus on active R pelvic anterior rotation and R lateral weight shift and acceptance on RLE with verbal and tactile cues for full R hip and knee extension with WB.  Required one sitting rest break secondary to LLE fatigue.  Changed to gait training in hallway with one UE support on wall rail on L during gait forwards x 25' x 2, retro x 25', and laterally to L and R x 10' each direction with ace wrap for DF assist and mod A overall for full advancement, placement and stabilization of RLE and verbal and visual cues (mirror) to maintain upright trunk posture and full R lateral weight shift during stance phase.  Required two sitting rest breaks and requested to end lateral stepping early secondary to fatigue.    Therapy Documentation Precautions:  Precautions Precautions: Fall Precaution Comments: right hemiparesis Restrictions Weight Bearing Restrictions: No Vital Signs: Therapy Vitals Pulse Rate: 61 BP: 125/78 mmHg Pain: Pain Assessment Pain Assessment: No/denies pain Pain Score: 0-No pain Locomotion : Ambulation Ambulation/Gait Assistance: 3: Mod  assist   See FIM for current functional status  Therapy/Group: Individual Therapy  Bruce Guerrero 02/11/2013, 11:06 AM

## 2013-02-11 NOTE — Plan of Care (Signed)
Problem: Food- and Nutrition-Related Knowledge Deficit (NB-1.1) Goal: Nutrition education Formal process to instruct or train a patient/client in a skill or to impart knowledge to help patients/clients voluntarily manage or modify food choices and eating behavior to maintain or improve health. Outcome: Completed/Met Date Met:  02/11/13 Nutrition Education Note  RD consulted for nutrition education regarding a Heart Healthy diet.   Lipid Panel     Component Value Date/Time    CHOL 207* 02/02/2013 0356    TRIG 180* 02/02/2013 0356    HDL 30* 02/02/2013 0356    CHOLHDL 6.9 02/02/2013 0356    VLDL 36 02/02/2013 0356    LDLCALC 141* 02/02/2013 0356    RD provided "Stroke Nutrition Therapy" handout from the [employer]. Reviewed patient's dietary recall. Provided examples on ways to decrease sodium and fat intake in diet. Discouraged intake of processed foods and use of salt shaker. Encouraged fresh fruits and vegetables as well as whole grain sources of carbohydrates to maximize fiber intake. Teach back method used.  Expect fair compliance.  Body mass index is 30.12 kg/(m^2). Pt meets criteria for obesity, class 1 based on current BMI.  Current diet order is Heart Healthy, patient is consuming approximately 100% of meals at this time. Labs and medications reviewed. No further nutrition interventions warranted at this time. RD contact information provided. If additional nutrition issues arise, please re-consult RD.  Joaquin Courts, RD, LDN, CNSC Pager [phone] After Hours Pager [phone]

## 2013-02-11 NOTE — Progress Notes (Signed)
Occupational Therapy Session Note  Patient Details  Name: Bruce Guerrero MRN: [id number] Date of Birth: Feb 03, 1961  Today's Date: 02/11/2013 Time: 0758-0900 Time Calculation (min): 62 min  Short Term Goals: Week 1:  OT Short Term Goal 1 (Week 1): Pt will perform all bathing LB bathing with mod assist including sit to stand. OT Short Term Goal 2 (Week 1): Pt will donn pullover shirt with supervision 2/3 sessions. OT Short Term Goal 3 (Week 1): Pt will perform LB dressing with mod assist sit to stand, with min instructional cueing for hemi dressing techniques. OT Short Term Goal 4 (Week 1): Pt will perform stand pivot transfer to the 3:1 with mod assist. OT Short Term Goal 5 (Week 1): Pt will perform self AAROM/PROM exercises for the RUE with supervision following handout.  Skilled Therapeutic Interventions/Progress Updates:   Patient opted to sponge bathe at sink as he showered yesterday.  Patient transitioning from sit to stand with min assist to close supervision.  In standing, patient with inactive right leg.  Patient able to extend knee with cueing to stand, but had difficulty with sustaining sufficient muscle activation in right leg.  Therefore min assist required during standing.  Patient with tendency to drift toward right with little attempt to self correct while standing.   Used second half of session to address neuromuscular re-education in right upper extremity.  Patient with preference to maintain weight shift onto left hip.  Slightly challenged to actively weight shift toward right side to encourage weight bearing on extended right arm.  Patient able to adduct right shoulder following weight bearing, patient also able with cueing to actively extend arm to serve as support. In supine, patient with increased shoulder adduction, flexion and extension, elbow flexion, and some elbow extension.  Re-education to encourage exhalation with effort during exercise, and utilizing correct muscle  groups for various movement patterns.  In sitting, patient able to actively flex shoulder and extend elbow for pre- reaching patterns.    Therapy Documentation Precautions:  Precautions Precautions: Fall Precaution Comments: right hemiparesis Restrictions Weight Bearing Restrictions: No   Pain:   Pain Assessment Pain Assessment: No/denies pain Pain Score: 0-No pain  See FIM for current functional status  Therapy/Group: Individual Therapy  Collier Salina 02/11/2013, 11:27 AM

## 2013-02-12 DIAGNOSIS — I6330 Cerebral infarction due to thrombosis of unspecified cerebral artery: Secondary | ICD-10-CM

## 2013-02-12 DIAGNOSIS — G8110 Spastic hemiplegia affecting unspecified side: Secondary | ICD-10-CM

## 2013-02-12 MED ORDER — BACLOFEN 5 MG HALF TABLET
5.0000 mg | ORAL_TABLET | Freq: Three times a day (TID) | ORAL | Status: DC | PRN
  Administered 2013-02-14 – 2013-02-25 (×9): 5 mg via ORAL
  Filled 2013-02-12 (×7): qty 1

## 2013-02-12 NOTE — Progress Notes (Signed)
52 y.o. right-handed male with history of hypertension. Patient was independent prior to admission working full time. Admitted 02/01/2013 with right-sided weakness and slurred speech. MRI of the brain showed a moderately enlarged deep white matter infarction on the left. MRA of the head with no large vessel occlusion identified. Carotid Dopplers with no ICA stenosis. Echocardiogram with ejection fraction of 60% grade 1 diastolic dysfunction. Patient did not receive TPA. Neurology services consulted presently maintained on Plavix for CVA prophylaxis with the addition of subcutaneous heparin for DVT prophylaxis. He is tolerating a regular diet  Subjective/Complaints: Notes cramping in his right foot. Otherwise doing well. Likes therapy  A 12 point review of systems has been performed and if not noted above is otherwise negative.  Objective: Vital Signs: Blood pressure 141/73, pulse 54, temperature 98.1 F (36.7 C), temperature source Oral, resp. rate 18, height 5\' 5"  (1.651 m), weight 82.101 kg (181 lb), SpO2 95.00%. No results found. No results found for this or any previous visit (from the past 72 hour(s)).   HEENT: normal Cardio: RRR and no murmurs Resp: CTA B/L and unlabored GI: BS positive and non tender, nondistended Extremity:  Pulses positive and No Edema Skin:   Intact Neuro: Alert/Oriented, Flat, Cranial Nerve Abnormalities Right central 7, Normal Sensory, Abnormal Motor Trace right shoulder adduction, trace right hip extension and Dysarthric, 5/5 on Left side Musc/Skel:  Normal and Other No shoulder pain with range of motion General no acute distress   Assessment/Plan: 1. Functional deficits secondary to Left subcortical infarct with severe right hemiplegia which require 3+ hours per day of interdisciplinary therapy in a comprehensive inpatient rehab setting. Physiatrist is providing close team supervision and 24 hour management of active medical problems listed  below. Physiatrist and rehab team continue to assess barriers to discharge/monitor patient progress toward functional and medical goals. FIM: FIM - Bathing Bathing Steps Patient Completed: Chest;Right Arm;Left Arm;Abdomen;Front perineal area;Buttocks;Right upper leg;Left upper leg;Right lower leg (including foot);Left lower leg (including foot) Bathing: 4: Steadying assist  FIM - Upper Body Dressing/Undressing Upper body dressing/undressing steps patient completed: Thread/unthread left sleeve of pullover shirt/dress;Put head through opening of pull over shirt/dress;Pull shirt over trunk Upper body dressing/undressing: 4: Min-Patient completed 75 plus % of tasks FIM - Lower Body Dressing/Undressing Lower body dressing/undressing steps patient completed: Thread/unthread left underwear leg;Pull underwear up/down;Thread/unthread left pants leg;Pull pants up/down;Don/Doff left shoe;Fasten/unfasten left shoe Lower body dressing/undressing: 3: Mod-Patient completed 50-74% of tasks  FIM - Toileting Toileting steps completed by patient: Adjust clothing prior to toileting;Performs perineal hygiene Toileting Assistive Devices: Grab bar or rail for support Toileting: 3: Mod-Patient completed 2 of 3 steps  FIM - Diplomatic Services operational officer Devices: Grab bars;Elevated toilet seat Toilet Transfers: 2-From toilet/BSC: Max A (lift and lower assist);2-To toilet/BSC: Max A (lift and lower assist)  FIM - Banker Devices: Arm rests Bed/Chair Transfer: 3: Bed > Chair or W/C: Mod A (lift or lower assist);3: Chair or W/C > Bed: Mod A (lift or lower assist)  FIM - Locomotion: Wheelchair Distance: 150 Locomotion: Wheelchair: 1: Total Assistance/staff pushes wheelchair (Pt<25%) FIM - Locomotion: Ambulation Locomotion: Ambulation Assistive Devices: Other (comment) (wall rail on L) Ambulation/Gait Assistance: 3: Mod assist Locomotion: Ambulation: 1: Travels  less than 50 ft with moderate assistance (Pt: 50 - 74%)  Comprehension Comprehension Mode: Auditory Comprehension: 6-Follows complex conversation/direction: With extra time/assistive device  Expression Expression Mode: Verbal Expression: 5-Expresses complex 90% of the time/cues < 10% of the time  Social Interaction  Social Interaction: 6-Interacts appropriately with others with medication or extra time (anti-anxiety, antidepressant).  Problem Solving Problem Solving: 5-Solves complex 90% of the time/cues < 10% of the time  Memory Memory: 5-Recognizes or recalls 90% of the time/requires cueing < 10% of the time  Medical Problem List and Plan:  1. Thrombotic left subcortical white matter infarct Right hemiplegia pure motor 2. DVT Prophylaxis/Anticoagulation: Subcutaneous heparin. Monitor platelet counts any signs of bleeding  3. Pain Management: Hydrocodone as needed. Monitor with increased mobility  4. Mood/anxiety. Ativan 0.5 mg every 6 hours as needed.  5. Neuropsych: This patient is capable of making decisions on his own behalf.  6. Hypertension. Norvasc 10 mg daily, Coreg 12.5 mg twice a day. Monitor with increased mobility  7. History of gout. Continue colchicine as prior to admission. No current pain in the right knee  8. Hyperlipidemia. Lipitor  9. RLE cramping: discussed pathology with pt. Add prn baclofen.  -pt will pursue stretching, heat   LOS (Days) 4 A FACE TO FACE EVALUATION WAS PERFORMED  SWARTZ,ZACHARY T 02/12/2013, 8:10 AM

## 2013-02-13 ENCOUNTER — Inpatient Hospital Stay (HOSPITAL_COMMUNITY): Payer: BC Managed Care – PPO | Admitting: *Deleted

## 2013-02-13 ENCOUNTER — Inpatient Hospital Stay (HOSPITAL_COMMUNITY): Payer: BC Managed Care – PPO | Admitting: Physical Therapy

## 2013-02-13 NOTE — Progress Notes (Addendum)
52 y.o. right-handed male with history of hypertension. Patient was independent prior to admission working full time. Admitted 02/01/2013 with right-sided weakness and slurred speech. MRI of the brain showed a moderately enlarged deep white matter infarction on the left. MRA of the head with no large vessel occlusion identified. Carotid Dopplers with no ICA stenosis. Echocardiogram with ejection fraction of 60% grade 1 diastolic dysfunction. Patient did not receive TPA. Neurology services consulted presently maintained on Plavix for CVA prophylaxis with the addition of subcutaneous heparin for DVT prophylaxis. He is tolerating a regular diet  Subjective/Complaints: Anxious last night. Couldn't lay flat to sleep without feeling sob. Ended up sleeping in recliner.  A 12 point review of systems has been performed and if not noted above is otherwise negative.  Objective: Vital Signs: Blood pressure 136/78, pulse 61, temperature 97.7 F (36.5 C), temperature source Oral, resp. rate 18, height 5\' 5"  (1.651 m), weight 82.101 kg (181 lb), SpO2 97.00%. No results found. No results found for this or any previous visit (from the past 72 hour(s)).   HEENT: normal Cardio: RRR and no murmurs Resp: CTA B/L and unlabored. No wheezes, rales GI: BS positive and non tender, nondistended Extremity:  Pulses positive and No Edema Skin:   Intact Neuro: Alert/Oriented, Flat, Cranial Nerve Abnormalities Right central 7, Normal Sensory, Abnormal Motor Trace right shoulder adduction, trace right hip extension and Dysarthric, 5/5 on Left side Musc/Skel:  Normal and Other No shoulder pain with range of motion General no acute distress, perhaps a little anxious   Assessment/Plan: 1. Functional deficits secondary to Left subcortical infarct with severe right hemiplegia which require 3+ hours per day of interdisciplinary therapy in a comprehensive inpatient rehab setting. Physiatrist is providing close team supervision  and 24 hour management of active medical problems listed below. Physiatrist and rehab team continue to assess barriers to discharge/monitor patient progress toward functional and medical goals. FIM: FIM - Bathing Bathing Steps Patient Completed: Chest;Right Arm;Left Arm;Abdomen;Front perineal area;Buttocks;Right upper leg;Left upper leg;Right lower leg (including foot);Left lower leg (including foot) Bathing: 4: Steadying assist  FIM - Upper Body Dressing/Undressing Upper body dressing/undressing steps patient completed: Thread/unthread left sleeve of pullover shirt/dress;Put head through opening of pull over shirt/dress;Pull shirt over trunk Upper body dressing/undressing: 4: Min-Patient completed 75 plus % of tasks FIM - Lower Body Dressing/Undressing Lower body dressing/undressing steps patient completed: Thread/unthread left underwear leg;Pull underwear up/down;Thread/unthread left pants leg;Pull pants up/down;Don/Doff left shoe;Fasten/unfasten left shoe Lower body dressing/undressing: 3: Mod-Patient completed 50-74% of tasks  FIM - Toileting Toileting steps completed by patient: Adjust clothing prior to toileting;Performs perineal hygiene Toileting Assistive Devices: Grab bar or rail for support Toileting: 3: Mod-Patient completed 2 of 3 steps  FIM - Diplomatic Services operational officer Devices: Grab bars;Elevated toilet seat Toilet Transfers: 2-From toilet/BSC: Max A (lift and lower assist);2-To toilet/BSC: Max A (lift and lower assist)  FIM - Banker Devices: Arm rests Bed/Chair Transfer: 3: Bed > Chair or W/C: Mod A (lift or lower assist);3: Chair or W/C > Bed: Mod A (lift or lower assist)  FIM - Locomotion: Wheelchair Distance: 150 Locomotion: Wheelchair: 1: Total Assistance/staff pushes wheelchair (Pt<25%) FIM - Locomotion: Ambulation Locomotion: Ambulation Assistive Devices: Other (comment) (wall rail on L) Ambulation/Gait  Assistance: 3: Mod assist Locomotion: Ambulation: 1: Travels less than 50 ft with moderate assistance (Pt: 50 - 74%)  Comprehension Comprehension Mode: Auditory Comprehension: 6-Follows complex conversation/direction: With extra time/assistive device  Expression Expression Mode: Verbal Expression: 5-Expresses complex  90% of the time/cues < 10% of the time  Social Interaction Social Interaction: 6-Interacts appropriately with others with medication or extra time (anti-anxiety, antidepressant).  Problem Solving Problem Solving: 5-Solves complex 90% of the time/cues < 10% of the time  Memory Memory: 5-Recognizes or recalls 90% of the time/requires cueing < 10% of the time  Medical Problem List and Plan:  1. Thrombotic left subcortical white matter infarct Right hemiplegia pure motor 2. DVT Prophylaxis/Anticoagulation: Subcutaneous heparin. Monitor platelet counts any signs of bleeding  3. Pain Management: Hydrocodone as needed. Monitor with increased mobility  4. Mood/anxiety. Ativan 0.5 mg every 6 hours as needed.  5. Neuropsych: This patient is capable of making decisions on his own behalf.  6. Hypertension. Norvasc 10 mg daily, Coreg 12.5 mg twice a day. Monitor with increased mobility  7. History of gout. Continue colchicine as prior to admission. No current pain in the right knee  8. Hyperlipidemia. Lipitor  9. RLE cramping: discussed pathology with pt. Add prn baclofen.  -pt will pursue stretching, heat 10. SOB--no signs of fluid overload. Appears to be anxiety driven  -has prn ativan ordered. Encouraged him to use this if anxiety recurs at night   LOS (Days) 5 A FACE TO FACE EVALUATION WAS PERFORMED  Noland Pizano T 02/13/2013, 7:51 AM

## 2013-02-13 NOTE — Progress Notes (Signed)
Physical Therapy Note  Patient Details  Name: DECARLO RIVET MRN: [id number] Date of Birth: 1961/02/11 Today's Date: 02/13/2013  4098-1191 (60 minutes) individual  Pain: no reported pain Focus of treatment: wc mobility training/ activity tolerance; Neuro re-ed RT LE; gait training with appropriate assistive device Treatment: Pt in recliner upon arrival; transfer stand /turn mod assist ; wc mobility 120 feet X 2 SBA using left extremities; transfer wc to mat (level) scoot min to SBA with reminder cues for legrest/armrest; sit to supine (mat) SBA with difficulty RT LE; supine to sit (mat) SBA; Neuro re-ed RT LE using powder board (gravity eliminated) AA hip flexion/extension , no isolated knee flexion or extension noted; gait 30 feet X 4 with RT PFRW + ace wrap right ankle mod assist ; pt able to partially advance RT LE without assist; returned to room with all needs within reach.   1330-1410 (40 minutes) individual Pain: no reported pain Focus of treatment: gait training; therapeutic exercise focused on strengthening/control RT LE/ activity tolerance Treatment: Pt up in wc ; wc mobility 120 feet SBA using left extremities; gait training- 50 feet X 2 rt. PFRW min /mod assist + RT prefab AFO; Nustep Level 4 X 10 minutes (3 minutes bilateral LEs only).   Dail Meece,JIM 02/13/2013, 9:19 AM

## 2013-02-13 NOTE — Progress Notes (Signed)
Occupational Therapy Note Patient Details  Name: ABELARDO SEIDNER MRN: [id number] Date of Birth: Oct 02, 1960 Today's Date: 02/13/2013  Time:  1000-1130  (90 min) Pain::  none Individual session  Focus of treatment was functional mobility, transfers,  Neuro-muscular reeducation, sitting balance, standing balance, therapeutic activities, postural control.  Pt. Transferred from recliner to wc with minimal assist.  Propelled wc from room to gym with supervision.  Pt positioned wc correctly beside mat, locked brakes, and transferred with minimal assist.  Addressed RUE NMRE in sit to stand with forced weight on RUE and mod assist to sustain the movement.  Engaged in RUE AAROM, closed chain on mat, stool , ball , bench.  Performed sit to stand and hold with RUE on bench and LUE held up in air.  Did activity to increase sustained activation of RUE in all planes.  Has shoulder elevation, scapula retraction, shoulder forward and backward flex/ext with Gravity eliminated.  No elbow flexion but minimal elbow extension with GE.  Provided physical cues for minimizing trunk involvement in arm movements.  Pt. transferred back to wc and went to room.  Washed hands at sink to removed dead, callused skin on palms and applied lotion.  Informed nursing.  Transferred wc to recliner with minimal assist and cues for increased eccentric control from stand to sit.  Left pt in recliner with call  bell,phone within reach. Brother, Butch in room as well.           Humberto Seals 02/13/2013, 11:04 AM

## 2013-02-14 ENCOUNTER — Inpatient Hospital Stay (HOSPITAL_COMMUNITY): Payer: BC Managed Care – PPO | Admitting: Occupational Therapy

## 2013-02-14 ENCOUNTER — Inpatient Hospital Stay (HOSPITAL_COMMUNITY): Payer: BC Managed Care – PPO | Admitting: Speech Pathology

## 2013-02-14 ENCOUNTER — Encounter (HOSPITAL_COMMUNITY): Payer: BC Managed Care – PPO

## 2013-02-14 ENCOUNTER — Inpatient Hospital Stay (HOSPITAL_COMMUNITY): Payer: BC Managed Care – PPO | Admitting: Physical Therapy

## 2013-02-14 DIAGNOSIS — I10 Essential (primary) hypertension: Secondary | ICD-10-CM

## 2013-02-14 DIAGNOSIS — M109 Gout, unspecified: Secondary | ICD-10-CM

## 2013-02-14 DIAGNOSIS — I6350 Cerebral infarction due to unspecified occlusion or stenosis of unspecified cerebral artery: Secondary | ICD-10-CM

## 2013-02-14 DIAGNOSIS — M25569 Pain in unspecified knee: Secondary | ICD-10-CM

## 2013-02-14 NOTE — Progress Notes (Signed)
Speech Language Pathology Daily Session Note & Discharge Summary  Patient Details  Name: Bruce Guerrero MRN: [id number] Date of Birth: 1960-06-12  Today's Date: 02/14/2013 Time: 0920-0950 Time Calculation (min): 30 min  Short Term Goals: Week 1: SLP Short Term Goal 1 (Week 1): Patient will identify 2 helpful compensatory strategies that increase intelligibility with Mod I. SLP Short Term Goal 2 (Week 1): Patient will self-monitor and correct intelligibility errors with increased wait time.  Skilled Therapeutic Interventions: Skilled treatment session focused on addressing speech intelligibility goals.  SLP facilitated session with open ended telephone and in person discussions regarding various topics and patient utilized strategies to increase intelligibility with overall Mod I.  Patient returned demonstration of oral motor exercises with Mod I with trace right sided labial weakness.  SLP educated patient regarding his ability to continue to perform exercise independently as well as recommendation for no further skilled SLP services at this time given that it is not impacting his intelligibility and patient in agreement.     FIM:  Comprehension Comprehension Mode: Auditory Comprehension: 6-Follows complex conversation/direction: With extra time/assistive device Expression Expression Mode: Verbal Expression: 6-Expresses complex ideas: With extra time/assistive device Social Interaction Social Interaction: 6-Interacts appropriately with others with medication or extra time (anti-anxiety, antidepressant). Problem Solving Problem Solving: 6-Solves complex problems: With extra time Memory Memory: 6-More than reasonable amt of time  Pain Pain Assessment Pain Assessment: No/denies pain  Therapy/Group: Individual Therapy   Speech Language Pathology Discharge Summary  Patient Details  Name: Bruce Guerrero MRN: [id number] Date of Birth: March 16, 1960  Today's Date:  02/14/2013  Patient has met 1 of 1 long term goals.  Patient to discharge at overall Modified Independent level.  Reasons goals not met: n/a   Clinical Impression/Discharge Summary: Patient met 1 out of 1 long term goals during CIR stay due to gains in speech intelligibility.  Patient is able to recall and utilize previously taught strategies as well as oral motor exercises.  As a result he is overall Mod I and, no further skilled SLP services are warranted at this time.       Care Partner:  Caregiver Able to Provide Assistance: Other (comment) (n/a)  Type of Caregiver Assistance:  (defer to OT/PT)  Recommendation:  None      Equipment: none   Reasons for discharge: Treatment goals met   Patient/Family Agrees with Progress Made and Goals Achieved: Yes   See FIM for current functional status  Charlane Ferretti., CCC-SLP [phone]  Delvecchio Madole 02/14/2013, 12:17 PM

## 2013-02-14 NOTE — Progress Notes (Signed)
52 y.o. right-handed male with history of hypertension. Patient was independent prior to admission working full time. Admitted 02/01/2013 with right-sided weakness and slurred speech. MRI of the brain showed a moderately enlarged deep white matter infarction on the left. MRA of the head with no large vessel occlusion identified. Carotid Dopplers with no ICA stenosis. Echocardiogram with ejection fraction of 60% grade 1 diastolic dysfunction. Patient did not receive TPA. Neurology services consulted presently maintained on Plavix for CVA prophylaxis with the addition of subcutaneous heparin for DVT prophylaxis. He is tolerating a regular diet  Subjective/Complaints: No pain c/os, slept well last noc but not previous nite  Review of systems: Negative except for weakness on the right side  Objective: Vital Signs: Blood pressure 143/82, pulse 56, temperature 97.2 F (36.2 C), temperature source Oral, resp. rate 16, height 5\' 5"  (1.651 m), weight 82.101 kg (181 lb), SpO2 96.00%. No results found. No results found for this or any previous visit (from the past 72 hour(s)).   HEENT: normal Cardio: RRR and no murmurs Resp: CTA B/L and unlabored GI: BS positive and non tender, nondistended Extremity:  Pulses positive and No Edema Skin:   Intact Neuro: Alert/Oriented, Flat, Cranial Nerve Abnormalities Right central 7, Normal Sensory, Abnormal Motor Trace right shoulder adduction, trace right hip extension, 2- R knee ext, 0/5 ankle and Dysarthric, 5/5 on Left side Musc/Skel:  Normal and Other No shoulder pain with range of motion General no acute distress   Assessment/Plan: 1. Functional deficits secondary to Left subcortical infarct with severe right hemiplegia which require 3+ hours per day of interdisciplinary therapy in a comprehensive inpatient rehab setting. Physiatrist is providing close team supervision and 24 hour management of active medical problems listed below. Physiatrist and rehab  team continue to assess barriers to discharge/monitor patient progress toward functional and medical goals. FIM: FIM - Bathing Bathing Steps Patient Completed: Chest;Right Arm;Left Arm;Abdomen;Front perineal area;Buttocks;Right upper leg;Left upper leg;Right lower leg (including foot);Left lower leg (including foot) Bathing: 4: Steadying assist  FIM - Upper Body Dressing/Undressing Upper body dressing/undressing steps patient completed: Thread/unthread left sleeve of pullover shirt/dress;Put head through opening of pull over shirt/dress;Pull shirt over trunk Upper body dressing/undressing: 4: Min-Patient completed 75 plus % of tasks FIM - Lower Body Dressing/Undressing Lower body dressing/undressing steps patient completed: Thread/unthread left underwear leg;Pull underwear up/down;Thread/unthread left pants leg;Pull pants up/down;Don/Doff left shoe;Fasten/unfasten left shoe Lower body dressing/undressing: 3: Mod-Patient completed 50-74% of tasks  FIM - Toileting Toileting steps completed by patient: Adjust clothing prior to toileting;Performs perineal hygiene Toileting Assistive Devices: Grab bar or rail for support Toileting: 3: Mod-Patient completed 2 of 3 steps  FIM - Diplomatic Services operational officer Devices: Grab bars;Elevated toilet seat Toilet Transfers: 2-From toilet/BSC: Max A (lift and lower assist);2-To toilet/BSC: Max A (lift and lower assist)  FIM - Press photographer Assistive Devices: Arm rests Bed/Chair Transfer: 4: Supine > Sit: Min A (steadying Pt. > 75%/lift 1 leg);4: Sit > Supine: Min A (steadying pt. > 75%/lift 1 leg)  FIM - Locomotion: Wheelchair Distance: 150 Locomotion: Wheelchair: 1: Total Assistance/staff pushes wheelchair (Pt<25%) FIM - Locomotion: Ambulation Locomotion: Ambulation Assistive Devices: Other (comment) (wall rail on L) Ambulation/Gait Assistance: 3: Mod assist Locomotion: Ambulation: 1: Travels less than 50 ft with  moderate assistance (Pt: 50 - 74%)  Comprehension Comprehension Mode: Auditory Comprehension: 6-Follows complex conversation/direction: With extra time/assistive device  Expression Expression Mode: Verbal Expression: 5-Expresses complex 90% of the time/cues < 10% of the time  Social Interaction  Social Interaction: 6-Interacts appropriately with others with medication or extra time (anti-anxiety, antidepressant).  Problem Solving Problem Solving: 5-Solves complex 90% of the time/cues < 10% of the time  Memory Memory: 5-Recognizes or recalls 90% of the time/requires cueing < 10% of the time  Medical Problem List and Plan:  1. Thrombotic left subcortical white matter infarct Right hemiplegia pure motor 2. DVT Prophylaxis/Anticoagulation: Subcutaneous heparin. Monitor platelet counts any signs of bleeding  3. Pain Management: Hydrocodone as needed. Monitor with increased mobility  4. Mood/anxiety. Ativan 0.5 mg every 6 hours as needed.  5. Neuropsych: This patient is capable of making decisions on his own behalf.  6. Hypertension. Norvasc 10 mg daily, Coreg 12.5 mg twice a day. Monitor with increased mobility  7. History of gout. Continue colchicine as prior to admission. No current pain in the right knee  8. Hyperlipidemia. Lipitor    LOS (Days) 6 A FACE TO FACE EVALUATION WAS PERFORMED  KIRSTEINS,ANDREW E 02/14/2013, 6:50 AM

## 2013-02-14 NOTE — Progress Notes (Signed)
Physical Therapy Session Note  Patient Details  Name: Bruce Guerrero MRN: [id number] Date of Birth: 1960-07-19  Today's Date: 02/14/2013 Time: 8295-6213 Time Calculation (min): 58 min  Short Term Goals: Week 1:  PT Short Term Goal 1 (Week 1): Pt will perform dynamic standing balance with mod assist PT Short Term Goal 2 (Week 1): Pt will perform stand pivot transfers R and L at mod assist with safe technique PT Short Term Goal 3 (Week 1): Pt will perform w/c mobility x 150' at supervision level using L hemi technique PT Short Term Goal 4 (Week 1): Pt will ambulate x 50' w/ LRAD at max assist of one person  Skilled Therapeutic Interventions/Progress Updates:    Pt received seated in recliner. Pt transferred recliner>w/c stand pivot mod A. Pt propelled w/c 150 ft to therapy gym supervision. Pt transferred w/c>mat stand pivot mod A. Sit >L side lying supervision. Performed NMR for RLE including active hip flexion/extension gravity eliminated and supine bridges. Manual facilitation and verbal cues for activation of RLE during bridges. Good activation of hip flexors and hip extensors gravity eliminated noted. Pt c/o tightness in front of leg during hip extension. Performed passive hip flexor stretch to increase mobility and decrease muscle tightness. Increased tone and some swelling noted R ankle. Discussed with pt that he has baclofen prescribed as needed and will need to ask for it if he is feeling spasms. Pt verbalized understanding. Attempted tall kneeling but ceased activity secondary to significant knee pain. Pt stated "it feels like I have gravel under my kneecap." Pt performed squat pivot transfers L and R x2 each with min guard and verbal cues for RLE placement and technique. Stand pivot transfers R and L x2 mod A. Pt performed seated forward reaching activity lifting hips off mat x10 with verbal cues and manual facilitation for slow, controlled descent. Pt demonstrated minimal weightbearing  through RLE first trial but increased weightbearing as activity progressed. Pt transferred mat>w/c and w/c>recliner stand pivot mod A. Pt left seated in recliner with needs in reach.   Therapy Documentation Precautions:  Precautions Precautions: Fall Precaution Comments: right hemiparesis Restrictions Weight Bearing Restrictions: No   Pain: Pain Assessment Pain Assessment: No/denies pain at beginning of session. Reported knee pain during kneeling activity, relived with repositioning.  Mobility: Bed Mobility Rolling Left: 5: Supervision Supine to Sit: 5: Supervision Sit to Supine: 5: Supervision Transfers Transfers: Yes Stand Pivot Transfers: 3: Mod assist Squat Pivot Transfers: 4: min guard Locomotion : Ambulation Ambulation/Gait Assistance: 3: Mod assist;2: Max Lawyer Distance: 150   See FIM for current functional status  Therapy/Group: Individual Therapy  Kristina Mcnorton 02/14/2013, 4:43 PM

## 2013-02-14 NOTE — Progress Notes (Signed)
Occupational Therapy Session Note  Patient Details  Name: Bruce Guerrero MRN: [id number] Date of Birth: 1960-06-24  Today's Date: 02/14/2013 Time: 1017-1100 Time Calculation (min): 43 min  Short Term Goals: Week 1:  OT Short Term Goal 1 (Week 1): Pt will perform all bathing LB bathing with mod assist including sit to stand. OT Short Term Goal 2 (Week 1): Pt will donn pullover shirt with supervision 2/3 sessions. OT Short Term Goal 3 (Week 1): Pt will perform LB dressing with mod assist sit to stand, with min instructional cueing for hemi dressing techniques. OT Short Term Goal 4 (Week 1): Pt will perform stand pivot transfer to the 3:1 with mod assist. OT Short Term Goal 5 (Week 1): Pt will perform self AAROM/PROM exercises for the RUE with supervision following handout.  Skilled Therapeutic Interventions/Progress Updates:    Pt seen for 1:1 OT with focus on RUE weight bearing to facilitate RUE movement.  Pt in recliner upon arrival and reports already dressing and not wanting to engage in bathing and dressing session but willing to participate in treatment session.  Pt performed stand pivot transfer to Lt with min assist, propelled w/c to therapy gym with good recall of hemi-technique.  Engaged in weight bearing through RUE with therapist providing manual facilitation to increase proper positioning to increase weight bearing while engaging in seated reaching task.  Following weight bearing engaged in towel glide activity and UE Ranger with support at elbow to decrease gravity and occasional tactile cues at pecs to decrease overuse of pecs.  Pt with horizontal adduction/abduction and elbow flexion with support at elbow this session.  Therapy Documentation Precautions:  Precautions Precautions: Fall Precaution Comments: right hemiparesis Restrictions Weight Bearing Restrictions: No General:   Vital Signs: Therapy Vitals Pulse Rate: 61 BP: 139/80 mmHg Pain:  Pt with no c/o pain this  session.  See FIM for current functional status  Therapy/Group: Individual Therapy  Rosalio Loud 02/14/2013, 11:03 AM

## 2013-02-14 NOTE — Progress Notes (Signed)
Physical Therapy Session Note  Patient Details  Name: Bruce Guerrero MRN: [id number] Date of Birth: 1960-06-29  Today's Date: 02/14/2013 Time: 1103-1200 Time Calculation (min): 57 min  Short Term Goals: Week 1:  PT Short Term Goal 1 (Week 1): Pt will perform dynamic standing balance with mod assist PT Short Term Goal 2 (Week 1): Pt will perform stand pivot transfers R and L at mod assist with safe technique PT Short Term Goal 3 (Week 1): Pt will perform w/c mobility x 150' at supervision level using L hemi technique PT Short Term Goal 4 (Week 1): Pt will ambulate x 50' w/ LRAD at max assist of one person  Skilled Therapeutic Interventions/Progress Updates:    Pt just returned to room from OT; pt reporting R knee sore from therapy yesterday; RN notified for pain meds.  Performed w/c mobility x 200' in controlled environment x 2 with supervision.  Changed RW from R PFRW to RW with hand splint on R and performed gait x 30' in controlled environment with mod-max A to maintain navigation of RW, assistance for full L lateral weight shift, full advancement of RLE (Ace wrap added for DF assist), stabilization of RLE in stance and verbal and tactile cues to maintain upright trunk control (pt tends to pitch trunk forwards to compensate for lack of hip extension strength); pt also presents with R trunk and pelvis retraction.  Changed to NMR on mat with sit > stand and reaching with LUE to place horseshoes on tall target forwards, up and to the L x 10 to facilitate RLE extension and L lateral weight shifting with min A; also performed sit <> squat with L lateral weight shifting and activation of RLE extension while reaching up for horseshoes on tall target and then performing squat to place horseshoes in box in front of pt on floor with min A.  Carryover of weight shifting in standing with alternating R and L weight shifting and alternating R and L foot taps to 4" step with focus on L lateral weight shift and  activation of RLE hip and knee flexion for swing phase and activation of R trunk and RLE extension in stance during LLE taps with mod-max A secondary to continued R hip weakness and instability.  Returned to room and transferred to recliner with min A for lunch.   Therapy Documentation Precautions:  Precautions Precautions: Fall Precaution Comments: right hemiparesis Restrictions Weight Bearing Restrictions: No Pain: Pain Assessment Pain Assessment: 0-10 Pain Score: 0-No pain Locomotion : Ambulation Ambulation/Gait Assistance: 3: Mod assist;2: Max Lawyer Distance: 150   See FIM for current functional status  Therapy/Group: Individual Therapy  Edman Circle [facility] 02/14/2013, 4:32 PM

## 2013-02-14 NOTE — Progress Notes (Signed)
I have reviewed and I agree with the following treatment note.  Ikram Riebe Hall, PT, DPT  

## 2013-02-15 ENCOUNTER — Inpatient Hospital Stay (HOSPITAL_COMMUNITY): Payer: BC Managed Care – PPO | Admitting: Physical Therapy

## 2013-02-15 ENCOUNTER — Encounter (HOSPITAL_COMMUNITY): Payer: BC Managed Care – PPO | Admitting: Occupational Therapy

## 2013-02-15 ENCOUNTER — Inpatient Hospital Stay (HOSPITAL_COMMUNITY): Payer: BC Managed Care – PPO

## 2013-02-15 ENCOUNTER — Inpatient Hospital Stay (HOSPITAL_COMMUNITY): Payer: BC Managed Care – PPO | Admitting: Occupational Therapy

## 2013-02-15 DIAGNOSIS — M25569 Pain in unspecified knee: Secondary | ICD-10-CM

## 2013-02-15 DIAGNOSIS — M109 Gout, unspecified: Secondary | ICD-10-CM

## 2013-02-15 DIAGNOSIS — I6330 Cerebral infarction due to thrombosis of unspecified cerebral artery: Secondary | ICD-10-CM

## 2013-02-15 DIAGNOSIS — I10 Essential (primary) hypertension: Secondary | ICD-10-CM

## 2013-02-15 DIAGNOSIS — G8110 Spastic hemiplegia affecting unspecified side: Secondary | ICD-10-CM

## 2013-02-15 DIAGNOSIS — M25561 Pain in right knee: Secondary | ICD-10-CM | POA: Diagnosis present

## 2013-02-15 MED ORDER — DICLOFENAC SODIUM 1 % TD GEL
2.0000 g | Freq: Four times a day (QID) | TRANSDERMAL | Status: DC
  Administered 2013-02-15 – 2013-02-22 (×18): 2 g via TOPICAL
  Filled 2013-02-15 (×2): qty 100

## 2013-02-15 NOTE — Progress Notes (Signed)
Physical Therapy Weekly Progress Note  Patient Details  Name: Bruce Guerrero MRN: [id number] Date of Birth: [birth year]  Today's Date: 02/15/2013 Time: 0900-1001 Time Calculation (min): 61 min  Patient has met 3 of 4 short term goals. Pt currently requires min A with squat pivot transfers and mod A for stand pivot transfers. Pt demonstrates improved weight bearing through RLE when completing sit>stand. Pt requires supervision for wheelchair propulsion secondary to difficulty with navigating tight spaces and turning L. Pt is able to ambulate with max A of 1 with hemi walker or single rail 30 ft secondary to fatigue. Pt demonstrates good carryover and is improving ability to self correct.   Patient continues to demonstrate the following deficits: R hemiplegia, balance, gait, weakness, pain, postural control and therefore will continue to benefit from skilled PT intervention to enhance overall performance with activity tolerance, balance, postural control, ability to compensate for deficits, functional use of  right upper extremity and right lower extremity and coordination.  Patient progressing toward long term goals..  Continue plan of care.  PT Short Term Goals Week 1:  PT Short Term Goal 1 (Week 1): Pt will perform dynamic standing balance with mod assist PT Short Term Goal 1 - Progress (Week 1): Met PT Short Term Goal 2 (Week 1): Pt will perform stand pivot transfers R and L at mod assist with safe technique PT Short Term Goal 2 - Progress (Week 1): Met PT Short Term Goal 3 (Week 1): Pt will perform w/c mobility x 150' at supervision level using L hemi technique PT Short Term Goal 3 - Progress (Week 1): Met PT Short Term Goal 4 (Week 1): Pt will ambulate x 50' w/ LRAD at max assist of one person PT Short Term Goal 4 - Progress (Week 1): Progressing toward goal Week 2:  PT Short Term Goal 1 (Week 2): Pt will perform squat pivot transfers R and L supervision PT Short Term Goal 2 (Week 2):  Pt will perform dynamic standing balance with min A PT Short Term Goal 3 (Week 2): Pt will ambulate 50 feet with LRAD with mod A PT Short Term Goal 4 (Week 2): Pt will perform bed mobility mod I  Skilled Therapeutic Interventions/Progress Updates:    Pt received seated in recliner. Pt transferred recliner>w/c min guard. Pt propelled w/c 200 ft using hemi technique supervision. Pt transferred w/c>mat supervision and sit>supine supervision. Performed NMR for RLE including active hip flexion and extension gravity eliminated, bilateral hip abduction, and bridges x10 each. Pt demonstrated improved R glut activation during bridges this session. Pt performed supine>sit supervision for safe placement of RUE and using LLE to assist with RLE off mat. Engaged pt in standing activity for R hip abduction activation against theraband. Mod A for balance and RLE support. Progressed to R hip abduction activation with L hip against tall table, LLE stepping forward x10 and LLE kicking box x15. Performed ambulation with L hemi walker x25 ft with max A for balance, RLE stability, and placement of RLE. Manual facilitation for hip abduction and R knee extension in stance. Verbal cues for upright trunk posture and step length. Pt had single LOB requiring max assist to regain balance secondary to foot placement/very narrow BOS. Returned to room, pt left seated in w/c with needs in reach.   Therapy Documentation Precautions:  Precautions Precautions: Fall Precaution Comments: right hemiparesis Restrictions Weight Bearing Restrictions: No   Pain: Pain Assessment Pain Assessment: 0-10 Pain Score: 1  Pain Type: Acute pain  Pain Location: Knee Intervention: repositioned Locomotion : Ambulation Ambulation/Gait Assistance: 2: Max assist Wheelchair Mobility Distance: 150   See FIM for current functional status  Therapy/Group: Individual Therapy  [name] 02/15/2013, 12:08 PM

## 2013-02-15 NOTE — Progress Notes (Signed)
Physical Therapy Session Note  Patient Details  Name: Bruce Guerrero MRN: [id number] Date of Birth: April 02, 1960  Today's Date: 02/15/2013 Time: 2956-2130 Time Calculation (min): 42 min  Short Term Goals: Week 2:  PT Short Term Goal 1 (Week 2): Pt will perform squat pivot transfers R and L supervision PT Short Term Goal 2 (Week 2): Pt will perform dynamic standing balance with min A PT Short Term Goal 3 (Week 2): Pt will ambulate 50 feet with LRAD with mod A PT Short Term Goal 4 (Week 2): Pt will perform bed mobility mod I  Skilled Therapeutic Interventions/Progress Updates:   Pt received in recliner; transferred to w/c with min A stand pivot with UE support on arm rest.  Performed w/c mobility in the room and on unit x 200' with supervision.  Performed NMR with NMES; see below for details.  Performed gait with LUE support on wall rail x 25' with mod A from therapist under RUE with facilitation for trunk control, lateral weight shifting and full weight acceptance on RLE and cues for activation of RLE extensors in stance for stability and assistance for placement of RLE secondary to increased tone in RLE ADD muscles causing narrow BOS; also required verbal cues for full step length LLE.    Therapy Documentation Precautions:  Precautions Precautions: Fall Precaution Comments: right hemiparesis Restrictions Weight Bearing Restrictions: No Pain:  No c/o pain Other Treatments: Treatments Neuromuscular Facilitation: Right;Lower Extremity;Activity to increase motor control;Activity to increase timing and sequencing;Activity to increase sustained activation;Activity to increase lateral weight shifting;Activity to increase anterior-posterior weight shifting with use of NMES for quad activation in open chain knee extension and during lateral weight shifts in standing with weight acceptance and activation of RLE glutes and quad in stance with LLE forwards and retro stepping with LUE support on wall  rail while NMES on.   Modalities Modalities: Archivist Stimulation Location: R quadriceps muscles Electrical Stimulation Action: R knee extension open chain and closed chain WB Electrical Stimulation Parameters: Please see EMPI unit; large muscle atrophy NMES Electrical Stimulation Goals: Neuromuscular facilitation  See FIM for current functional status  Therapy/Group: Individual Therapy  Edman Circle [facility] 02/15/2013, 2:58 PM

## 2013-02-15 NOTE — Progress Notes (Signed)
Occupational Therapy Session Note  Patient Details  Name: Bruce Guerrero MRN: [id number] Date of Birth: [birth year]  Today's Date: 02/15/2013 Time: 7829-5621 Time Calculation (min): 33 min  Skilled Therapeutic Interventions/Progress Updates:    Pt worked on RUE weightbearing on the therapy mat.  Began having pt reach across midline with the LUE while supporting himself with the RUE while moving rings across the ROM arc.  Pt needed min assist when reaching beyond BOS.  Pt using his head and left trunk to initiate most balance correction instead of using the weightbearing RUE.  Transitioned to using the tilted stool while having stool elevated on a step to increase difficulty.  Pt able to initiate shoulder flexion but needs cueing to not throw his trunk into the movement.  Also had pt hold stool in place with shoulder flexion and elbow extension while therapist provided resistance.  Pt transferred from mat to the wheelchair with mod assist to return to the room.      Therapy Documentation Precautions:  Precautions Precautions: Fall Precaution Comments: right hemiparesis Restrictions Weight Bearing Restrictions: No  Pain: Pain Assessment Pain Assessment: No/denies pain  ADL: See FIM for current functional status  Therapy/Group: Individual Therapy  [place] Strieter OTR/L 02/15/2013, 4:04 PM

## 2013-02-15 NOTE — Progress Notes (Signed)
52 y.o. right-handed male with history of hypertension. Patient was independent prior to admission working full time. Admitted 02/01/2013 with right-sided weakness and slurred speech. MRI of the brain showed a moderately enlarged deep white matter infarction on the left. MRA of the head with no large vessel occlusion identified. Carotid Dopplers with no ICA stenosis. Echocardiogram with ejection fraction of 60% grade 1 diastolic dysfunction. Patient did not receive TPA. Neurology services consulted presently maintained on Plavix for CVA prophylaxis with the addition of subcutaneous heparin for DVT prophylaxis. He is tolerating a regular diet  Subjective/Complaints: Right knee pain, had problems at home in past  Review of systems: Negative except for weakness on the right side  Objective: Vital Signs: Blood pressure 142/75, pulse 58, temperature 98.1 F (36.7 C), temperature source Oral, resp. rate 17, height 5\' 5"  (1.651 m), weight 82.101 kg (181 lb), SpO2 97.00%. No results found. No results found for this or any previous visit (from the past 72 hour(s)).   HEENT: normal Cardio: RRR and no murmurs Resp: CTA B/L and unlabored GI: BS positive and non tender, nondistended Extremity:  Pulses positive and No Edema Skin:   Intact Neuro: Alert/Oriented, Flat, Cranial Nerve Abnormalities Right central 7, Normal Sensory, Abnormal Motor Trace right shoulder adduction, trace right hip extension, 2- R knee ext, 0/5 ankle and Dysarthric, 5/5 on Left side Musc/Skel:  Normal and Other No shoulder pain with range of motion General no acute distress   Assessment/Plan: 1. Functional deficits secondary to Left subcortical infarct with severe right hemiplegia which require 3+ hours per day of interdisciplinary therapy in a comprehensive inpatient rehab setting. Physiatrist is providing close team supervision and 24 hour management of active medical problems listed below. Physiatrist and rehab team  continue to assess barriers to discharge/monitor patient progress toward functional and medical goals. FIM: FIM - Bathing Bathing Steps Patient Completed: Chest;Right Arm;Left Arm;Abdomen;Front perineal area;Buttocks;Right upper leg;Left upper leg;Right lower leg (including foot);Left lower leg (including foot) Bathing: 4: Steadying assist  FIM - Upper Body Dressing/Undressing Upper body dressing/undressing steps patient completed: Thread/unthread left sleeve of pullover shirt/dress;Put head through opening of pull over shirt/dress;Pull shirt over trunk Upper body dressing/undressing: 4: Min-Patient completed 75 plus % of tasks FIM - Lower Body Dressing/Undressing Lower body dressing/undressing steps patient completed: Thread/unthread left underwear leg;Pull underwear up/down;Thread/unthread left pants leg;Pull pants up/down;Don/Doff left shoe;Fasten/unfasten left shoe Lower body dressing/undressing: 3: Mod-Patient completed 50-74% of tasks  FIM - Toileting Toileting steps completed by patient: Adjust clothing prior to toileting;Performs perineal hygiene Toileting Assistive Devices: Grab bar or rail for support Toileting: 3: Mod-Patient completed 2 of 3 steps  FIM - Diplomatic Services operational officer Devices: Grab bars;Elevated toilet seat Toilet Transfers: 2-From toilet/BSC: Max A (lift and lower assist);2-To toilet/BSC: Max A (lift and lower assist)  FIM - Banker Devices: Arm rests Bed/Chair Transfer: 4: Bed > Chair or W/C: Min A (steadying Pt. > 75%);4: Chair or W/C > Bed: Min A (steadying Pt. > 75%)  FIM - Locomotion: Wheelchair Distance: 150 Locomotion: Wheelchair: 5: Travels 150 ft or more: maneuvers on rugs and over door sills with supervision, cueing or coaxing FIM - Locomotion: Ambulation Locomotion: Ambulation Assistive Devices: Walker - Rolling;Orthosis Ambulation/Gait Assistance: 3: Mod assist;2: Max assist Locomotion:  Ambulation: 1: Travels less than 50 ft with maximal assistance (Pt: 25 - 49%)  Comprehension Comprehension Mode: Auditory Comprehension: 6-Follows complex conversation/direction: With extra time/assistive device  Expression Expression Mode: Verbal Expression: 6-Expresses complex ideas: With extra  time/assistive device  Social Interaction Social Interaction: 6-Interacts appropriately with others with medication or extra time (anti-anxiety, antidepressant).  Problem Solving Problem Solving: 6-Solves complex problems: With extra time  Memory Memory: 6-More than reasonable amt of time  Medical Problem List and Plan:  1. Thrombotic left subcortical white matter infarct Right hemiplegia pure motor 2. DVT Prophylaxis/Anticoagulation: Subcutaneous heparin. Monitor platelet counts any signs of bleeding  3. Pain Management: Hydrocodone as needed. Monitor with increased mobility  4. Mood/anxiety. Ativan 0.5 mg every 6 hours as needed.  5. Neuropsych: This patient is capable of making decisions on his own behalf.  6. Hypertension. Norvasc 10 mg daily, Coreg 12.5 mg twice a day. Monitor with increased mobility  7. History of gout. Continue colchicine as prior to admission. Now with current pain in the right knee, trial voltaren gel.  Pt does not remember having knee Xrays 8. Hyperlipidemia. Lipitor    LOS (Days) 7 A FACE TO FACE EVALUATION WAS PERFORMED  KIRSTEINS,ANDREW E 02/15/2013, 7:24 AM

## 2013-02-15 NOTE — Progress Notes (Signed)
I have reviewed and I agree with the following weekly progress/treatment note.  Edman Circle, PT, DPT

## 2013-02-15 NOTE — Progress Notes (Signed)
Occupational Therapy Session Note  Patient Details  Name: Bruce Guerrero MRN: [id number] Date of Birth: [birth year]  Today's Date: 02/15/2013 Time: 1002-1103 Time Calculation (min): 61 min  Short Term Goals: Week 1:  OT Short Term Goal 1 (Week 1): Pt will perform all bathing LB bathing with mod assist including sit to stand. OT Short Term Goal 2 (Week 1): Pt will donn pullover shirt with supervision 2/3 sessions. OT Short Term Goal 3 (Week 1): Pt will perform LB dressing with mod assist sit to stand, with min instructional cueing for hemi dressing techniques. OT Short Term Goal 4 (Week 1): Pt will perform stand pivot transfer to the 3:1 with mod assist. OT Short Term Goal 5 (Week 1): Pt will perform self AAROM/PROM exercises for the RUE with supervision following handout.  Skilled Therapeutic Interventions/Progress Updates:    Pt worked on Chief of Staff during session.  Utilized NMES for 20 mins while working on active shoulder movements in the RUE.  Placed estim on digit flexors and extensors with alternating current.  Settings at 50 PPS, 300 pulse widith, 10 second stimulation with each area and 10 seconds off.  Flexor intensity set at level 15, extensors set at level 36.  Had pt work on sliding his arm forward on a flat surface with towel underneath while extensors were stimulated and then working on pulling his arm back when flexors were initiated.  Progressed to picking up bean bags in the same manner and working on dropping them into a container placed at midline.  Pt needs mod facilitation to reduce compensatory strategies in the trunk when attempting functional movement in the RUE.  Pt transferred to and from various surfaces during session as well with mod assist stand pivot/squat pivot.  Therapy Documentation Precautions:  Precautions Precautions: Fall Precaution Comments: right hemiparesis Restrictions Weight Bearing Restrictions: No  Pain: Pain Assessment Pain  Assessment: 0-10 Pain Score: 1  Pain Type: Acute pain Pain Location: Knee ADL: See FIM for current functional status  Therapy/Group: Individual Therapy  Tilton Marsalis OTR/L 02/15/2013, 12:30 PM

## 2013-02-16 ENCOUNTER — Inpatient Hospital Stay (HOSPITAL_COMMUNITY): Payer: BC Managed Care – PPO

## 2013-02-16 ENCOUNTER — Encounter (HOSPITAL_COMMUNITY): Payer: BC Managed Care – PPO | Admitting: Occupational Therapy

## 2013-02-16 ENCOUNTER — Inpatient Hospital Stay (HOSPITAL_COMMUNITY): Payer: BC Managed Care – PPO | Admitting: Occupational Therapy

## 2013-02-16 NOTE — Progress Notes (Signed)
I have read and agree with treatment note.  Becky Jeilani Grupe, PT 

## 2013-02-16 NOTE — Progress Notes (Signed)
52 y.o. right-handed male with history of hypertension. Patient was independent prior to admission working full time. Admitted 02/01/2013 with right-sided weakness and slurred speech. MRI of the brain showed a moderately enlarged deep white matter infarction on the left. MRA of the head with no large vessel occlusion identified. Carotid Dopplers with no ICA stenosis. Echocardiogram with ejection fraction of 60% grade 1 diastolic dysfunction. Patient did not receive TPA. Neurology services consulted presently maintained on Plavix for CVA prophylaxis with the addition of subcutaneous heparin for DVT prophylaxis. He is tolerating a regular diet  Subjective/Complaints: Right knee pain,feels like gravel under knee cap  Review of systems: Negative except for weakness on the right side  Objective: Vital Signs: Blood pressure 159/79, pulse 63, temperature 97.9 F (36.6 C), temperature source Oral, resp. rate 18, height 5\' 5"  (1.651 m), weight 80.06 kg (176 lb 8 oz), SpO2 97.00%. Dg Knee 1-2 Views Right  02/15/2013   CLINICAL DATA:  Right knee pain.  EXAM: RIGHT KNEE - 1-2 VIEW  COMPARISON:  March 12, 2003.  FINDINGS: There is no evidence of fracture or dislocation. Minimal suprapatellar joint effusion is noted. Mild spurring of tibial spines is noted. No significant joint space narrowing is noted. Soft tissues are unremarkable.  IMPRESSION: Minimal suprapatellar joint effusion. Mild spurring of tibial spines. No other significant abnormality seen in the right knee.   Electronically Signed   By: Roque Lias M.D.   On: 02/15/2013 10:05   No results found for this or any previous visit (from the past 72 hour(s)).   HEENT: normal Cardio: RRR and no murmurs Resp: CTA B/L and unlabored GI: BS positive and non tender, nondistended Extremity:  Pulses positive and No Edema Skin:   Intact Neuro: Alert/Oriented, Flat, Cranial Nerve Abnormalities Right central 7, Normal Sensory, Abnormal Motor Trace right  shoulder adduction, trace right hip extension, 2- R knee ext, 0/5 ankle and Dysarthric, 5/5 on Left side Musc/Skel:  Normal and Other No shoulder pain with range of motion, no knee effusion noted General no acute distress   Assessment/Plan: 1. Functional deficits secondary to Left subcortical infarct with severe right hemiplegia which require 3+ hours per day of interdisciplinary therapy in a comprehensive inpatient rehab setting. Physiatrist is providing close team supervision and 24 hour management of active medical problems listed below. Physiatrist and rehab team continue to assess barriers to discharge/monitor patient progress toward functional and medical goals. FIM: FIM - Bathing Bathing Steps Patient Completed: Chest;Right Arm;Left Arm;Abdomen;Front perineal area;Buttocks;Right upper leg;Left upper leg;Right lower leg (including foot);Left lower leg (including foot) Bathing: 4: Steadying assist  FIM - Upper Body Dressing/Undressing Upper body dressing/undressing steps patient completed: Thread/unthread left sleeve of pullover shirt/dress;Put head through opening of pull over shirt/dress;Pull shirt over trunk Upper body dressing/undressing: 4: Min-Patient completed 75 plus % of tasks FIM - Lower Body Dressing/Undressing Lower body dressing/undressing steps patient completed: Thread/unthread left underwear leg;Pull underwear up/down;Thread/unthread left pants leg;Pull pants up/down;Don/Doff left shoe;Fasten/unfasten left shoe Lower body dressing/undressing: 3: Mod-Patient completed 50-74% of tasks  FIM - Toileting Toileting steps completed by patient: Adjust clothing prior to toileting;Performs perineal hygiene Toileting Assistive Devices: Grab bar or rail for support Toileting: 3: Mod-Patient completed 2 of 3 steps  FIM - Diplomatic Services operational officer Devices: Grab bars;Elevated toilet seat Toilet Transfers: 2-From toilet/BSC: Max A (lift and lower assist);2-To  toilet/BSC: Max A (lift and lower assist)  FIM - Press photographer Assistive Devices: Arm rests Bed/Chair Transfer: 5: Supine > Sit: Supervision (  verbal cues/safety issues);5: Sit > Supine: Supervision (verbal cues/safety issues);4: Bed > Chair or W/C: Min A (steadying Pt. > 75%);5: Chair or W/C > Bed: Supervision (verbal cues/safety issues)  FIM - Locomotion: Wheelchair Distance: 150 Locomotion: Wheelchair: 5: Travels 150 ft or more: maneuvers on rugs and over door sills with supervision, cueing or coaxing FIM - Locomotion: Ambulation Locomotion: Ambulation Assistive Devices: Chief Operating Officer Ambulation/Gait Assistance: 2: Max assist Locomotion: Ambulation: 1: Travels less than 50 ft with maximal assistance (Pt: 25 - 49%)  Comprehension Comprehension Mode: Auditory Comprehension: 6-Follows complex conversation/direction: With extra time/assistive device  Expression Expression Mode: Verbal Expression: 6-Expresses complex ideas: With extra time/assistive device  Social Interaction Social Interaction: 6-Interacts appropriately with others with medication or extra time (anti-anxiety, antidepressant).  Problem Solving Problem Solving: 6-Solves complex problems: With extra time  Memory Memory: 6-Assistive device: No helper  Medical Problem List and Plan:  1. Thrombotic left subcortical white matter infarct Right hemiplegia pure motor 2. DVT Prophylaxis/Anticoagulation: Subcutaneous heparin. Monitor platelet counts any signs of bleeding  3. Pain Management: Hydrocodone as needed. Monitor with increased mobility  4. Mood/anxiety. Ativan 0.5 mg every 6 hours as needed.  5. Neuropsych: This patient is capable of making decisions on his own behalf.  6. Hypertension. Norvasc 10 mg daily, Coreg 12.5 mg twice a day. Monitor with increased mobility  7. History of gout. Continue colchicine as prior to admission. Now with current pain in the right knee, trial voltaren gel.   knee Xrays show no sign of DJD, suspect chondromalacia 8. Hyperlipidemia. Lipitor    LOS (Days) 8 A FACE TO FACE EVALUATION WAS PERFORMED  Pakou Rainbow E 02/16/2013, 7:43 AM

## 2013-02-16 NOTE — Consult Note (Signed)
INITIAL DIAGNOSTIC EVALUATION - [facility]   MEDICAL NECESSITY:  Bruce Guerrero was seen on the [facility] for an initial diagnostic evaluation owing to the patient's diagnosis of stroke.   According to medical records, Bruce Guerrero was admitted to the rehab unit owing to "functional deficits secondary to left subcortical infarct with severe right hemiplegia." MRI of the brain showed a deep white matter infarction on the left. MRA of the head showed no large vessel occlusion. No ICA stenosis. Echocardiogram with ejection fraction of 60% grade 1 diastolic dysfunction. Patient did not receive TPA. Neurology services were consulted and the patient is maintained on Plavix for CVA prophylaxis with the addition of subcutaneous heparin for DVT prophylaxis.   During today's visit, Bruce Guerrero denied experiencing any major cognitive difficulties post stroke. He admitted to transient depressive symptoms and described his current mood as "not bad." He endorsed no history of mental health issues. He was suffering from mild speech issues that has generally resolved, but he continues to suffer from significant right-side motor problems. He believes that he is making mild strides in therapy but that it is slow going. Bruce Guerrero reported having a good social support system that involves his family and friends. In fact, his adult son lives with him and is able to assist with daily life activities as needed when the patient discharges home.   PROCEDURES ADMINISTERED: [1 unit [id number] on 02/14/13] Diagnostic clinical interview  Review of available records  Emotional & Behavioral Evaluation: Bruce Guerrero was appropriately dressed for season and situation, and he appeared tidy and well-groomed. Normal posture was noted. He was friendly and rapport was easily established. His speech was as expected and he was able to express ideas effectively. He seemed  to understand test directions readily. His affect was appropriately modulated. Attention and motivation were good. Optimal test taking conditions were maintained.  From an emotional standpoint, Bruce Guerrero denied experiencing any major symptoms of clinical psychopathology beyond a normal reaction to his present medical situation. Suicidal/homicidal ideation, plan or intent was denied. No manic or hypomanic episodes were reported. The patient denied ever experiencing any auditory/visual hallucinations. No major behavioral or personality changes were endorsed.    Overall, Bruce Guerrero denies experiencing any major issues with cognition or mental health. He seems to be experiencing a normal emotional reaction to having had a stroke. No treatment or follow-up for mood appears necessary. However, cognitive testing may be valuable to assess for possible cognitive changes post stroke. This will be set up for later this week with my colleague, Bruce Guerrero, or with me the following Monday. Further recommendations will follow that assessment.      Debbe Mounts, Psy.D.  Clinical Neuropsychologist

## 2013-02-16 NOTE — Progress Notes (Signed)
Occupational Therapy Session Note  Patient Details  Name: Bruce Guerrero MRN: [id number] Date of Birth: [birth year]  Today's Date: 02/16/2013 Time: 2130-8657 Time Calculation (min): 48 min  Skilled Therapeutic Interventions/Progress Updates:    Pt propelled his wheelchair down to the gym with supervision.  Transferred to therapy mat with mod assist stand pivot.  Worked on neuromuscular re-education for the RLE and RUE during session.  Had pt work on weightbearing over the RUE on mat while reaching across for checkers with the LUE.  Focus on pushing himself back up to the sitting position using the RUE once he grasped the checker.  Pt needs mod facilitation to return to sitting position as well as mod demonstrational cueing to avoid initiating movement with his head instead of the RUE.  Incorporated sit to stand to place checkers in the grid with emphasis on forward trunk flexion, while reaching with the LUE and transitioning to standing.  Pt needing mod assist at the right knee to perform full knee extension and he demonstrates posterior bias and LOB in static standing.  Finished session by having pt perform UE ergonometer for RUE strengthening.  Used ace bandage to help with right hand grip.  Had pt perform 2 intervals of 2 mins each with bilateral UEs and 2 intervals just using the RUE.  Pt needed mod assist for using just the RUE with mod instructional cueing for breathing technique as well.    Therapy Documentation Precautions:  Precautions Precautions: Fall Precaution Comments: right hemiparesis Restrictions Weight Bearing Restrictions: No  Pain: Pain Assessment Pain Assessment: No/denies pain Pain Score: 3  Pain Location: Knee Pain Orientation: Right ADL: See FIM for current functional status  Therapy/Group: Individual Therapy  Geremy Rister OTR/L 02/16/2013, 1:56 PM

## 2013-02-16 NOTE — Progress Notes (Signed)
Physical Therapy Note  Patient Details  Name: Bruce Guerrero MRN: [id number] Date of Birth: 10/12/1960 Today's Date: 02/16/2013  9:00 - 10:00 60 minutes Individual session Patient reports pain in right knee at 3. RN administered gel during treatment session.  Patient brought himself to gym for therapy. Patient used biodex balance system for feedback on center of gravity and weight shifting to right using postural control and weight shifting training exercises. Patient able to get weight in center but had difficulty getting total weight to right LE. Patient worked in standing at table on reaching and getting weight shifted to right. Patient exercised on Nustep for 2 minutes x 3 sets working on right LE extension. Patient ambulated with railing in hallway and right AFO 30 feet x 2 with min assist. Patient able to progress right LE independently each step. Patient required cueing to increase weight shift and stance time on right. Patient ambulated 55 feet with hemi walker and AFO and min assist. Patient propelled self back to room with hemi technique. Patient transferred wheelchair to recliner with close supervision - pt had decreased weight on right LE during transfer. Patient left in recliner with all items in reach.   [name] 02/16/2013, 11:00 AM

## 2013-02-16 NOTE — Progress Notes (Signed)
Physical Therapy Session Note  Patient Details  Name: Bruce Guerrero MRN: [id number] Date of Birth: 11/25/1960  Today's Date: 02/16/2013 Time: 1308-6578 Time Calculation (min): 45 min  Short Term Goals: Week 2:  PT Short Term Goal 1 (Week 2): Pt will perform squat pivot transfers R and L supervision PT Short Term Goal 2 (Week 2): Pt will perform dynamic standing balance with min A PT Short Term Goal 3 (Week 2): Pt will ambulate 50 feet with LRAD with mod A PT Short Term Goal 4 (Week 2): Pt will perform bed mobility mod I  Skilled Therapeutic Interventions/Progress Updates:    Pt received seated in recliner. Pt stated he was tired from earlier therapies but agreeable to session. Pt transferred recliner>w/c stand pivot min A. Pt propelled w/c 150 ft to therapy gym mod I. Pt transferred w/c>mat squat pivot min guard. Performed seated trunk control activities including anterior, posterior, and lateral leans. Min A to return upright from R lateral leans. Progressed to trunk rhythmic stabilization. Increased delay noted when attempting to stabilize to R. Pt performed sit<>stand without UE support x5 with manual facilitation for weight bearing through RLE. Pt demonstrated improved weight bearing and balance during sit<>stand this session. Performed mat<>w/c stand pivot transfer with hemi walker mod A. Pt donned R AFO for improved foot clearance and knee stability, performed stand step transfer with hemi walker x2 min A. Pt ambulated 40 ft with hemi walker and R AFO min-mod A (mod A as pt fatigued). Manual facilitation for R knee extension and upright trunk posture. Returned pt to room, pt transferred w/c>recliner stand pivot min A. Pt left seated in recliner with all needs in reach.   Therapy Documentation Precautions:  Precautions Precautions: Fall Precaution Comments: right hemiparesis Restrictions Weight Bearing Restrictions: No   Pain: Pain Assessment Pain Assessment: No/denies  pain Locomotion : Ambulation Ambulation/Gait Assistance: 3: Mod assist Wheelchair Mobility Distance: 150   See FIM for current functional status  Therapy/Group: Individual Therapy  Sarae Nicholes 02/16/2013, 3:21 PM

## 2013-02-16 NOTE — Progress Notes (Signed)
Occupational Therapy Weekly Progress Note  Patient Details  Name: Bruce Guerrero MRN: [id number] Date of Birth: 17-Jul-1960  Today's Date: 02/16/2013 Time: 7829-5621 Time Calculation (min): 44 min  Patient has met 3 of 5 short term goals.  He continues to need mod facilitation for all stand pivot transfers secondary to RLE weakness and is currently at a Brunnstrum stage III level in his right arm and stage II in his right hand.  He also demonstrates slight flexor tone in his finger flexors and chest which affect his ability to actively use the UE as well.  His right knee continues to buckle with weightbearing tasks and he tends to maintain a flexed posture in sitting.  Goals are set overall at a min assist level.  Feel he needs extensive neuro re-ed to increase his independence to this level and continue with recovery of the RUE and RLE.  Will continue with inpatient rehab.  Patient continues to demonstrate the following deficits: decreased balance, decreased RUE and RLE strength, decreased RUE coordination and therefore will continue to benefit from skilled OT intervention to enhance overall performance with BADL and iADL.  Patient progressing toward long term goals..  Continue plan of care.  OT Short Term Goals Week 2:  OT Short Term Goal 1 (Week 2): Pt will donn pullover shirt with supervision 2/3 sessions. OT Short Term Goal 2 (Week 2): Pt will perform all bathing sit to stand with min assist. OT Short Term Goal 3 (Week 2): Pt will perform toilet transfers stand pivot with min assist. OT Short Term Goal 4 (Week 2): Pt will perform LB dressing with min assist. OT Short Term Goal 5 (Week 2): Pt will use the RUE as a stabilizer with min assist during bathing tasks.  Skilled Therapeutic Interventions/Progress Updates:    During session utilized neuromuscular electrical stimulation to the RUE finger flexors and extensors.  Parameters set at previous sessions listing with the exception of  intensity which was set at 29 on the digit extensors and 29 on the flexors.  Pt with decreased ability to tolerate stimulation in extensors as based on yesterdays settings.  While stimulation was active had pt focus on attempting to push his arm forward while using a board with a washcloth under his hand.  Pt tends to overcompensate with his trunk with all attempted movements.  He is able to demonstrate some shoulder flexion and elbow extension but internal rotators of the chest tend to adduct the arm when he attempts reaching tasks.  Stimulation was used for 20 mins with no adverse reactions.  Finished session by having pt use the UE Ranger to work on shoulder flexion with external rotation.  Needs mod facilitation to achieve small movements.  Therapy Documentation Precautions:  Precautions Precautions: Fall Precaution Comments: right hemiparesis Restrictions Weight Bearing Restrictions: No  Pain: Pain Assessment Pain Assessment: No/denies pain ADL: See FIM for current functional status  Other Treatments: Modalities Modalities: Archivist Stimulation Location: To digit flexors and extensors of the right hand Electrical Stimulation Action: digit extension and digit flexion Electrical Stimulation Goals: Neuromuscular facilitation    Therapy/Group: Individual Therapy  Chipper Koudelka OTR/L 02/16/2013, 10:05 AM

## 2013-02-16 NOTE — Patient Care Conference (Signed)
Inpatient RehabilitationTeam Conference and Plan of Care Update Date: 02/16/2013   Time: 11:30 AM    Patient Name: Bruce Guerrero      Medical Record Number: [id number]  Date of Birth: 12-Jul-1960 Sex: Male         Room/Bed: 4W01C/4W01C-01 Payor Info: Payor: BLUE CROSS BLUE [employer]IELD / Plan: BCBS PPO OUT OF STATE / Product Type: *No Product type* /    Admitting Diagnosis: CVA  Admit Date/Time:  02/08/2013  4:56 PM Admission Comments: No comment available   Primary Diagnosis:  <principal problem not specified> Principal Problem: <principal problem not specified>  Patient Active Problem List   Diagnosis Date Noted  . Knee pain, right anterior 02/15/2013  . CVA (cerebral infarction) 02/01/2013  . Gout 08/05/2012  . HTN (hypertension) 08/05/2012    Expected Discharge Date: Expected Discharge Date: 03/01/13  Team Members Present: Physician leading conference: Dr. Claudette Laws Social Worker Present: Dossie Der, LCSW;Jenny Kaeya Schiffer, LCSW Nurse Present:  Phylliss Bob) PT Present: Harriet Butte, PT OT Present: Rosalio Loud, OT;James Ronalee Red, OT SLP Present: Fae Pippin, SLP PPS Coordinator present : Edson Snowball, Chapman Fitch, RN, CRRN     Current Status/Progress Goal Weekly Team Focus  Medical   Some increased RLEstrength  ambulate prior to discharge  cont therapy   Bowel/Bladder   Continent of bowel and bladder;LBM 02/14/13  Continent of bowel and bladder  Remain continent with rountine toileting   Swallow/Nutrition/ Hydration             ADL's   Pt is currenlty min assist level for UB selfcare and mod assist for LB dressing and toileting.  Pt with Brunnstum stage III in the right arm and stage II in the hand.  min assist for bathing, supervision dressing, min assist toileting and toilet transfers  neuromuscular re-education, selfcare retraining, therapeutic exercise.   Mobility   Supervision bed mobility, min A transfers, mod A standing, max A  gait/stairs  Min A gait/stairs/transfers, mod I wheelchair  gait, transfers, standing balance, RUE/LE NMR   Communication   Mod I   Mod I   goals met and education completed    Safety/Cognition/ Behavioral Observations  no unsafe behavior         Pain   Voltaren gel  TID to rt knee/ 1-2 vicodin q4 hours.   pain less than or equal to 3  monitor for signs or symptoms of pain q shift   Skin   brusig to abdomen  no new skin breakdown  ensure pt turns self q2rs in bed and boots self in chair    Rehab Goals Patient on target to meet rehab goals: Yes Rehab Goals Revised: Pt with min assist goals overall. *See Care Plan and progress notes for long and short-term goals.  Barriers to Discharge: Physical assist for ADL and Mob    Possible Resolutions to Barriers:  se above    Discharge Planning/Teaching Needs:  Pt plans to return to his home with his sons and brother to stay with him.  Sons will need to come in for family education closer to d/c.  Brother has already been signed off on transfers.   Team Discussion:  Pt has some flexion and extension.  He is having some knee pain and MD is trying Voltaren.  Pt has good neurological improvement.  Pt used AFO with PT today and it was helpful per PT and pt.  Pt doing better with transfers, also.  Revisions to Treatment Plan:  None   Continued Need for Acute Rehabilitation Level of Care: The patient requires daily medical management by a physician with specialized training in physical medicine and rehabilitation for the following conditions: Daily direction of a multidisciplinary physical rehabilitation program to ensure safe treatment while eliciting the highest outcome that is of practical value to the patient.: Yes Daily medical management of patient stability for increased activity during participation in an intensive rehabilitation regime.: Yes Daily analysis of laboratory values and/or radiology reports with any subsequent need for  medication adjustment of medical intervention for : Neurological problems  Veronica Fretz, [place] 02/16/2013, 2:17 PM

## 2013-02-17 ENCOUNTER — Inpatient Hospital Stay (HOSPITAL_COMMUNITY): Payer: BC Managed Care – PPO | Admitting: Physical Therapy

## 2013-02-17 ENCOUNTER — Inpatient Hospital Stay (HOSPITAL_COMMUNITY): Payer: BC Managed Care – PPO | Admitting: Occupational Therapy

## 2013-02-17 DIAGNOSIS — M109 Gout, unspecified: Secondary | ICD-10-CM

## 2013-02-17 DIAGNOSIS — I6330 Cerebral infarction due to thrombosis of unspecified cerebral artery: Secondary | ICD-10-CM

## 2013-02-17 DIAGNOSIS — M25569 Pain in unspecified knee: Secondary | ICD-10-CM

## 2013-02-17 DIAGNOSIS — G8110 Spastic hemiplegia affecting unspecified side: Secondary | ICD-10-CM

## 2013-02-17 DIAGNOSIS — I10 Essential (primary) hypertension: Secondary | ICD-10-CM

## 2013-02-17 MED ORDER — BOOST / RESOURCE BREEZE PO LIQD: 1.0000 | Freq: Every day | ORAL | Status: DC | PRN

## 2013-02-17 NOTE — Progress Notes (Signed)
Social Work Patient ID: Bruce Guerrero, male   DOB: June 07, 1960, 52 y.o.   MRN: [id number]  CSW met with pt to update him of team conference discussion.  Pt is having knee pain and MD is going to try Voltaren. Pt is having neurological improvement and gaining flexion and extension.  Pt needs more assistance with lower body ADLs than upper body.  He is transferring well with squat pivot transfers and is min assist with gait and used AFO.  Pt reports noticing a difference with AFO.  Told him that team still feels 03-01-13 is a realistic targeted d/c date.  Pt would like to see his d/c date changed to 02-26-13 and is motivated to get goals accomplished by then.  CSW told pt that CSW would pass this on to to the team and encouraged him to continue to work hard. CSW tried to catch up with son this morning, without success.  Will talk with son soon re: pt's d/c plan/needs.  CSW will continue to follow pt and assist with d/c needs.

## 2013-02-17 NOTE — Progress Notes (Signed)
I have reviewed and I agree with the following treatment note.  Advait Buice Hall, PT, DPT  

## 2013-02-17 NOTE — Progress Notes (Signed)
Occupational Therapy Session Note  Patient Details  Name: Bruce Guerrero MRN: [id number] Date of Birth: 1960-07-09  Today's Date: 02/17/2013 Time: 0732-0830 and 4098-1191 Time Calculation (min): 58 min and 27 min  Short Term Goals: Week 2:  OT Short Term Goal 1 (Week 2): Pt will donn pullover shirt with supervision 2/3 sessions. OT Short Term Goal 2 (Week 2): Pt will perform all bathing sit to stand with min assist. OT Short Term Goal 3 (Week 2): Pt will perform toilet transfers stand pivot with min assist. OT Short Term Goal 4 (Week 2): Pt will perform LB dressing with min assist. OT Short Term Goal 5 (Week 2): Pt will use the RUE as a stabilizer with min assist during bathing tasks.  Skilled Therapeutic Interventions/Progress Updates:    1) Pt seen for ADL retraining with focus on functional transfers and increased participation with self-care tasks.  Pt willing to participate in bathing at shower level.  Pt min-supervision squat pivot transfer to tub bench in walk-in shower with use of grab bar.  Educated pt on proper positioning of RLE when standing to increase standing balance while completing bathing and dressing.  Pt easily frustrated when donning shirt, requiring physical assistance to thread RUE after multiple attempts even with verbal cues.  Educated on hemi-technique with donning socks with pt able to don both socks.  Engaged in RUE NM Re-ed in sitting in gym with focus on shoulder flexion/extension and horizontal abduction/adduction.  Pt tends to overcompensate with use of trunk, requiring tactile cues and manual facilitation to attempt to increase proper positioning.  2) Pt seen for 1:1 OT with focus on RUE NM re-ed.  Engaged in standing activity in therapy gym with focus on weight bearing through RUE when reaching across midline with Lt hand to obtain checkers.  Manual facilitation to provide increased positioning to facilitate increased weight bearing.  Pt reports pain in Lt  knee post standing ~15 mins, discussed weight shifting to Rt in standing to decrease strain on LLE.  Pt returned to room, performed squat pivot transfer to recliner with close supervision.  Therapy Documentation Precautions:  Precautions Precautions: Fall Precaution Comments: right hemiparesis Restrictions Weight Bearing Restrictions: No General:   Vital Signs: Therapy Vitals Temp: 97.4 F (36.3 C) Temp src: Oral Pulse Rate: 75 Resp: 18 BP: 141/73 mmHg Patient Position, if appropriate: Lying Oxygen Therapy SpO2: 98 % O2 Device: None (Room air) Pain:  Pt with c/o pain in Lt heel, RN notified.  See FIM for current functional status  Therapy/Group: Individual Therapy  Rosalio Loud 02/17/2013, 8:40 AM

## 2013-02-17 NOTE — Progress Notes (Signed)
Physical Therapy Session Note  Patient Details  Name: Bruce Guerrero MRN: [id number] Date of Birth: 06-09-1960  Today's Date: 02/17/2013 Time: 4098-1191, 4782-9562 Time Calculation (min): 60 min, 47 min  Short Term Goals: Week 2:  PT Short Term Goal 1 (Week 2): Pt will perform squat pivot transfers R and L supervision PT Short Term Goal 2 (Week 2): Pt will perform dynamic standing balance with min A PT Short Term Goal 3 (Week 2): Pt will ambulate 50 feet with LRAD with mod A PT Short Term Goal 4 (Week 2): Pt will perform bed mobility mod I  Skilled Therapeutic Interventions/Progress Updates:    AM session: Pt received seated in recliner. Pt transferred recliner>w/c squat pivot supervision. Pt propelled w/c to therapy gym mod I. Attempted weight shifting on Biodex, pt demonstrated difficulty achieving knee and hip extension on RLE for weight acceptance/weight shifting so discontinued activity. Applied NMES R quad to facilitate activation and performed seated active-assisted knee extension x10 with minimal quad contraction noted. Then performed forward and back stepping with LLE with mod A and manual facilitation for R knee extension, R hip extension, and upright trunk.   Unable to achieve good quad contraction with NMES secondary to pt discomfort. Transitioned to forward and back stepping without NMES in hallway with L rail for support. Manual facilitation for R knee extension, hip extension, and upright trunk posture. Utilized Ship broker for [employer] on weight shifting. Minimal weight shift, short step length, trunk flexion and R rotation noted when advancing LLE. After several trials, pt stated "I am scared!" and requested to sit. Provided emotional support, reasoning for working on weight shifting onto hemiparetic LE, and discussed option of using Maxisky for body weight support next session. Pt agreed and stated he was also planning to ask his PA for antianxiety medication. Pt propelled w/c  to room mod I, transferred w/c>recliner stand pivot min A. Pt left seated in recliner with needs in reach.   PM session: Pt received seated in recliner. Pt transferred recliner>w/c squat pivot supervision. Pt propelled w/c to therapy gym mod I. Pt transferred w/c>mat squat pivot supervision. Pt performed sit>supine mod I demonstrating good hip flexion against gravity to bring RLE onto mat. Performed passive hip flexor stretch to decrease tightness and increase hip extension mobility. Pt initially unable to achieve neutral hip (0 degrees extension), after 4x30 second stretch able to achieve approximately 5 degrees hip extension. Pt reported he was feeling anxious, requested to return to sitting. Instructed pt in deep relaxation breathing and discussed stroke support group. Pt stated he spoke with neuro psychologist yesterday. Pt stated he was ready to resume stretching. Performed active hip flexion/hip extension in side lying with therapist supporting RLE against gravity x10. Performed bilateral gastroc stretch x3, R hamstring stretch x3, R adductor stretch x3. Pt performed supine>sit mod I and transferred mat>w/c squat pivot supervision. Returned pt to room, pt transferred w/c>recliner stand pivot min A. Pt left seated in recliner with needs in reach.   Therapy Documentation Precautions:  Precautions Precautions: Fall Precaution Comments: right hemiparesis Restrictions Weight Bearing Restrictions: No   Pain: AM session: Pain Assessment Pain Assessment: 0-10 Pain Score: 1  Pain Type: Acute pain Pain Location: Heel Pain Intervention(s): Repositioned  PM session: Pt denies pain Locomotion : Wheelchair Mobility Distance: 150   See FIM for current functional status  Therapy/Group: Individual Therapy  Haizlee Henton 02/17/2013, 10:52 AM

## 2013-02-17 NOTE — Progress Notes (Signed)
52 y.o. right-handed male with history of hypertension. Patient was independent prior to admission working full time. Admitted 02/01/2013 with right-sided weakness and slurred speech. MRI of the brain showed a moderately enlarged deep white matter infarction on the left. MRA of the head with no large vessel occlusion identified. Carotid Dopplers with no ICA stenosis. Echocardiogram with ejection fraction of 60% grade 1 diastolic dysfunction. Patient did not receive TPA. Neurology services consulted presently maintained on Plavix for CVA prophylaxis with the addition of subcutaneous heparin for DVT prophylaxis. He is tolerating a regular diet  Subjective/Complaints: Suggest recommended discharge date of 1223. 2014. Patient states he is going home on 02/26/2013 no matter what I say  OT feels patient may be able to discharge earlier than projected date.  Review of systems: Negative except for weakness on the right side  Objective: Vital Signs: Blood pressure 141/73, pulse 75, temperature 97.4 F (36.3 C), temperature source Oral, resp. rate 18, height 5\' 5"  (1.651 m), weight 80.06 kg (176 lb 8 oz), SpO2 98.00%. No results found. No results found for this or any previous visit (from the past 72 hour(s)).   HEENT: normal Cardio: RRR and no murmurs Resp: CTA B/L and unlabored GI: BS positive and non tender, nondistended Extremity:  Pulses positive and No Edema Skin:   Intact Neuro: Alert/Oriented, Flat, Cranial Nerve Abnormalities Right central 7, Normal Sensory, Abnormal Motor Trace right shoulder adduction, trace right hip extension, 2- R knee ext, 0/5 ankle and Dysarthric, 5/5 on Left side Musc/Skel:  Normal and Other No shoulder pain with range of motion, no knee effusion noted General no acute distress   Assessment/Plan: 1. Functional deficits secondary to Left subcortical infarct with severe right hemiplegia which require 3+ hours per day of interdisciplinary therapy in a comprehensive  inpatient rehab setting. Physiatrist is providing close team supervision and 24 hour management of active medical problems listed below. Physiatrist and rehab team continue to assess barriers to discharge/monitor patient progress toward functional and medical goals. FIM: FIM - Bathing Bathing Steps Patient Completed: Chest;Right Arm;Left Arm;Abdomen;Front perineal area;Buttocks;Right upper leg;Left upper leg Bathing: 4: Steadying assist  FIM - Upper Body Dressing/Undressing Upper body dressing/undressing steps patient completed: Thread/unthread left sleeve of pullover shirt/dress;Put head through opening of pull over shirt/dress;Pull shirt over trunk Upper body dressing/undressing: 4: Min-Patient completed 75 plus % of tasks FIM - Lower Body Dressing/Undressing Lower body dressing/undressing steps patient completed: Thread/unthread right underwear leg;Thread/unthread left underwear leg;Pull underwear up/down;Thread/unthread right pants leg;Thread/unthread left pants leg;Don/Doff right sock;Don/Doff left sock;Don/Doff left shoe;Fasten/unfasten left shoe Lower body dressing/undressing: 4: Min-Patient completed 75 plus % of tasks  FIM - Toileting Toileting steps completed by patient: Adjust clothing prior to toileting;Performs perineal hygiene Toileting Assistive Devices: Grab bar or rail for support Toileting: 3: Mod-Patient completed 2 of 3 steps  FIM - Diplomatic Services operational officer Devices: Grab bars;Elevated toilet seat Toilet Transfers: 2-From toilet/BSC: Max A (lift and lower assist);2-To toilet/BSC: Max A (lift and lower assist)  FIM - Press photographer Assistive Devices: Arm rests Bed/Chair Transfer: 4: Bed > Chair or W/C: Min A (steadying Pt. > 75%);4: Chair or W/C > Bed: Min A (steadying Pt. > 75%)  FIM - Locomotion: Wheelchair Distance: 150 Locomotion: Wheelchair: 6: Travels 150 ft or more, turns around, maneuvers to table, bed or toilet,  negotiates 3% grade: maneuvers on rugs and over door sills independently FIM - Locomotion: Ambulation Locomotion: Ambulation Assistive Devices: Chief Operating Officer Ambulation/Gait Assistance: 3: Mod assist Locomotion: Ambulation:  1: Travels less than 50 ft with moderate assistance (Pt: 50 - 74%)  Comprehension Comprehension Mode: Auditory Comprehension: 5-Follows basic conversation/direction: With no assist  Expression Expression Mode: Verbal Expression: 5-Expresses basic needs/ideas: With no assist  Social Interaction Social Interaction: 6-Interacts appropriately with others with medication or extra time (anti-anxiety, antidepressant).  Problem Solving Problem Solving: 6-Solves complex problems: With extra time  Memory Memory: 6-Assistive device: No helper  Medical Problem List and Plan:  1. Thrombotic left subcortical white matter infarct Right hemiplegia pure motor 2. DVT Prophylaxis/Anticoagulation: Subcutaneous heparin. Monitor platelet counts any signs of bleeding  3. Pain Management: Hydrocodone as needed. Monitor with increased mobility  4. Mood/anxiety. Ativan 0.5 mg every 6 hours as needed.  5. Neuropsych: This patient is capable of making decisions on his own behalf.  6. Hypertension. Norvasc 10 mg daily, Coreg 12.5 mg twice a day. Monitor with increased mobility  7. History of gout. Continue colchicine as prior to admission. Now with current pain in the right knee, trial voltaren gel.  knee Xrays show no sign of DJD, suspect chondromalacia 8. Hyperlipidemia. Lipitor    LOS (Days) 9 A FACE TO FACE EVALUATION WAS PERFORMED  Marko Skalski E 02/17/2013, 8:47 AM

## 2013-02-18 ENCOUNTER — Inpatient Hospital Stay (HOSPITAL_COMMUNITY): Payer: BC Managed Care – PPO | Admitting: Physical Therapy

## 2013-02-18 ENCOUNTER — Inpatient Hospital Stay (HOSPITAL_COMMUNITY): Payer: BC Managed Care – PPO | Admitting: Speech Pathology

## 2013-02-18 ENCOUNTER — Encounter (HOSPITAL_COMMUNITY): Payer: BC Managed Care – PPO | Admitting: Occupational Therapy

## 2013-02-18 ENCOUNTER — Inpatient Hospital Stay (HOSPITAL_COMMUNITY): Payer: BC Managed Care – PPO

## 2013-02-18 MED ORDER — SERTRALINE HCL 50 MG PO TABS
50.0000 mg | ORAL_TABLET | Freq: Every day | ORAL | Status: DC
  Administered 2013-02-18 – 2013-02-26 (×9): 50 mg via ORAL
  Filled 2013-02-18 (×10): qty 1

## 2013-02-18 NOTE — Progress Notes (Signed)
I have reviewed and I agree with the following treatment note.  Terriyah Westra Hall, PT, DPT  

## 2013-02-18 NOTE — Progress Notes (Signed)
Occupational Therapy Session Note  Patient Details  Name: Bruce Guerrero MRN: [id number] Date of Birth: 1961-02-19  Today's Date: 02/18/2013 Time: 7829-5621 Time Calculation (min): 25 min  Short Term Goals: Week 2:  OT Short Term Goal 1 (Week 2): Pt will donn pullover shirt with supervision 2/3 sessions. OT Short Term Goal 2 (Week 2): Pt will perform all bathing sit to stand with min assist. OT Short Term Goal 3 (Week 2): Pt will perform toilet transfers stand pivot with min assist. OT Short Term Goal 4 (Week 2): Pt will perform LB dressing with min assist. OT Short Term Goal 5 (Week 2): Pt will use the RUE as a stabilizer with min assist during bathing tasks.  Skilled Therapeutic Interventions/Progress Updates:  Patient sitting EOB with his son and brother present.  Son left before session began.  Self care retraining to include safe transfers bed>w/c>tub bench>w/c, shower in sit and stand.  Focused session on safety during transfers and sit><stand, use of RUE as stabilizer and manage/position RUE during BADL tasks and with transfers.  Patient required mod vcs and min-mod assist with above interventions.  Patient presented with increased anxiety and at times unsafe behaviors.  Patient's response to the question by this OT of when his discharge date was, he stated, "Maybe today", (looked at his brother then said), "I'm not kidding". Patient became more anxious and frustrated as well as his face, neck and trunk turning red when having difficulty to don his shirt then threw it on the bed indicating he did not want to keep trying. He asked this OT to leave stating, "This is not about you, it is a family issue."  Told patient OT would come back in ~10 minutes to attempt to complete the session.  Therapy Documentation Precautions:  Precautions Precautions: Fall Precaution Comments: right hemiparesis Restrictions Weight Bearing Restrictions: No Pain: Denies pain ADL: See FIM for current  functional status  Therapy/Group: Individual Therapy  Dennis Killilea 02/18/2013, 8:13 AM

## 2013-02-18 NOTE — Progress Notes (Signed)
Physical Therapy Note  Patient Details  Name: Bruce Guerrero MRN: [id number] Date of Birth: [birth year] Today's Date: 02/18/2013  Time: 1330-1356 26 minutes  1:1 No c/o pain.  Treatment focused on gait training with blue rocker AFO.  Pt gait with max A with hemi walker 3 x 45' with manual facilitation at hips for anterior wt shift over R LE, tactile cues to encourage R hip flexion for swing.  Pt with decreased buckling and improved safety with gait training with blue rocker AFO.  Pt educated on wear schedule and cleaning of AFO, pt expresses understanding.   [name] 02/18/2013, 1:57 PM

## 2013-02-18 NOTE — Progress Notes (Signed)
52 y.o. right-handed male with history of hypertension. Patient was independent prior to admission working full time. Admitted 02/01/2013 with right-sided weakness and slurred speech. MRI of the brain showed a moderately enlarged deep white matter infarction on the left. MRA of the head with no large vessel occlusion identified. Carotid Dopplers with no ICA stenosis. Echocardiogram with ejection fraction of 60% grade 1 diastolic dysfunction. Patient did not receive TPA. Neurology services consulted presently maintained on Plavix for CVA prophylaxis with the addition of subcutaneous heparin for DVT prophylaxis. He is tolerating a regular diet  Subjective/Complaints: Suggest recommended discharge date of 1223. 2014. Patient states he is going home on 02/26/2013 no matter what I say  OT feels patient may be able to discharge earlier than projected date.  Review of systems: Negative except for weakness on the right side  Objective: Vital Signs: Blood pressure 140/60, pulse 60, temperature 97.9 F (36.6 C), temperature source Oral, resp. rate 20, height 5\' 4"  (1.626 m), weight 80.9 kg (178 lb 5.6 oz), SpO2 99.00%. No results found. No results found for this or any previous visit (from the past 72 hour(s)).   HEENT: normal Cardio: RRR and no murmurs Resp: CTA B/L and unlabored GI: BS positive and non tender, nondistended Extremity:  Pulses positive and No Edema Skin:   Intact Neuro: Alert/Oriented, Flat, Cranial Nerve Abnormalities Right central 7, Normal Sensory, Abnormal Motor Trace right shoulder adduction, trace right hip extension, 2- R knee ext, 0/5 ankle and Dysarthric, 5/5 on Left side Musc/Skel:  Normal and Other No shoulder pain with range of motion, no knee effusion noted General no acute distress   Assessment/Plan: 1. Functional deficits secondary to Left subcortical infarct with severe right hemiplegia which require 3+ hours per day of interdisciplinary therapy in a comprehensive  inpatient rehab setting. Physiatrist is providing close team supervision and 24 hour management of active medical problems listed below. Physiatrist and rehab team continue to assess barriers to discharge/monitor patient progress toward functional and medical goals. FIM: FIM - Bathing Bathing Steps Patient Completed: Chest;Right Arm;Left Arm;Abdomen;Front perineal area;Buttocks;Right upper leg;Left upper leg Bathing: 4: Steadying assist  FIM - Upper Body Dressing/Undressing Upper body dressing/undressing steps patient completed: Thread/unthread left sleeve of pullover shirt/dress;Put head through opening of pull over shirt/dress;Pull shirt over trunk Upper body dressing/undressing: 4: Min-Patient completed 75 plus % of tasks FIM - Lower Body Dressing/Undressing Lower body dressing/undressing steps patient completed: Thread/unthread right underwear leg;Thread/unthread left underwear leg;Pull underwear up/down;Thread/unthread right pants leg;Thread/unthread left pants leg;Don/Doff right sock;Don/Doff left sock;Don/Doff left shoe;Fasten/unfasten left shoe Lower body dressing/undressing: 4: Min-Patient completed 75 plus % of tasks  FIM - Toileting Toileting steps completed by patient: Adjust clothing prior to toileting;Performs perineal hygiene Toileting Assistive Devices: Grab bar or rail for support Toileting: 3: Mod-Patient completed 2 of 3 steps  FIM - Diplomatic Services operational officer Devices: Grab bars;Elevated toilet seat Toilet Transfers: 2-From toilet/BSC: Max A (lift and lower assist);2-To toilet/BSC: Max A (lift and lower assist)  FIM - Banker Devices: Arm rests Bed/Chair Transfer: 5: Bed > Chair or W/C: Supervision (verbal cues/safety issues);5: Chair or W/C > Bed: Supervision (verbal cues/safety issues)  FIM - Locomotion: Wheelchair Distance: 150 Locomotion: Wheelchair: 6: Travels 150 ft or more, turns around, maneuvers to  table, bed or toilet, negotiates 3% grade: maneuvers on rugs and over door sills independently FIM - Locomotion: Ambulation Locomotion: Ambulation Assistive Devices: Chief Operating Officer Ambulation/Gait Assistance: 3: Mod assist Locomotion: Ambulation: 0: Activity did not  occur  Comprehension Comprehension Mode: Auditory Comprehension: 5-Follows basic conversation/direction: With no assist  Expression Expression Mode: Verbal Expression: 5-Expresses basic needs/ideas: With no assist  Social Interaction Social Interaction: 5-Interacts appropriately 90% of the time - Needs monitoring or encouragement for participation or interaction.  Problem Solving Problem Solving: 5-Solves complex 90% of the time/cues < 10% of the time  Memory Memory: 6-Assistive device: No helper  Medical Problem List and Plan:  1. Thrombotic left subcortical white matter infarct Right hemiplegia pure motor 2. DVT Prophylaxis/Anticoagulation: Subcutaneous heparin. Monitor platelet counts any signs of bleeding  3. Pain Management: Hydrocodone as needed. Monitor with increased mobility  4. Mood/anxiety. Ativan 0.5 mg every 6 hours as needed.  5. Neuropsych: This patient is capable of making decisions on his own behalf.  6. Hypertension. Norvasc 10 mg daily, Coreg 12.5 mg twice a day. Monitor with increased mobility  7. History Right knee pain suspect chondromalacia rather than gout 8. Hyperlipidemia. Lipitor  9.  Anxiety rec SSRI =/- prn benzo (while i hospital)  LOS (Days) 10 A FACE TO FACE EVALUATION WAS PERFORMED  KIRSTEINS,ANDREW E 02/18/2013, 7:52 AM

## 2013-02-18 NOTE — Plan of Care (Signed)
Problem: RH SAFETY Goal: RH STG ADHERE TO SAFETY PRECAUTIONS W/ASSISTANCE/DEVICE STG Adhere to Safety Precautions With Mod I  Outcome: Progressing Pt refuses bed alarm ,but son stays at the bedside day and night and assist w/ transfers for pt to stand and void.Both son and pt refuse bedalarm

## 2013-02-18 NOTE — Progress Notes (Signed)
Social Work Patient ID: Bruce Guerrero, male   DOB: 1960-05-14, 52 y.o.   MRN: [id number]  CSW met with pt to make sure everything was okay with him and his brother, as they had a disagreement this morning.  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  This is the son that will be pt's caregiver, as well, so he will need some help.  Pt was calm as we discussed his concerns and he feels it is going to work out and that he and his brother will be fine.  Pt's other son is almost done with his semester at [employer] so he can help more too.  Pt is still motivated to d/c from rehab early.  Team is aware of his goal.  CSW encouraged pt to call CSW if he becomes anxious again or needs to talk.  Pt talked with the MD about anti-anxiety medication today, as well.  CSW also encouraged pt to take this time to care for himself so that someday he can better care for his mother.  Pt expressed understanding in that, also stating that "family is everything, family comes first."

## 2013-02-18 NOTE — Progress Notes (Signed)
Occupational Therapy Session Note  Patient Details  Name: Bruce Guerrero MRN: [id number] Date of Birth: 12-May-1960  Today's Date: 02/18/2013 Time: 0930-1000 Time Calculation (min): 30 min  Short Term Goals: Week 2:  OT Short Term Goal 1 (Week 2): Pt will donn pullover shirt with supervision 2/3 sessions. OT Short Term Goal 2 (Week 2): Pt will perform all bathing sit to stand with min assist. OT Short Term Goal 3 (Week 2): Pt will perform toilet transfers stand pivot with min assist. OT Short Term Goal 4 (Week 2): Pt will perform LB dressing with min assist. OT Short Term Goal 5 (Week 2): Pt will use the RUE as a stabilizer with min assist during bathing tasks.  Skilled Therapeutic Interventions: Neuromuscular re-ed with emphasis on bil UE activities, assisted weight-bearing through R-UE during therapeutic activity.  Patient completed 1 game of Goban, standing at table with facilitated weight-bearing through R-UE while reaching for game pieces and during game play.   Patient completed bilateral ring toss (hands interlaced) to promote improved proprioceptive re-ed of R-UE with verbal cues to weight-shift and maintain midline orientation.    Patient was provided and re-educated on use of wash mit and encouraged to bathe using left hand to assist right with wash mit donned.   Patient verbalized understanding but required repeated cues for attention to right UE and to slow pace in order to normalize movement patterns.      Therapy Documentation Precautions:  Precautions Precautions: Fall Precaution Comments: right hemiparesis Restrictions Weight Bearing Restrictions: No  Vital Signs: Therapy Vitals Pulse Rate: 68 BP: 140/70 mmHg  Pain: Pain Assessment Pain Assessment: No/denies pain  See FIM for current functional status  Therapy/Group: Individual Therapy  Sly Parlee 02/18/2013, 12:56 PM

## 2013-02-18 NOTE — Progress Notes (Signed)
Physical Therapy Session Note  Patient Details  Name: Bruce Guerrero MRN: [id number] Date of Birth: Jul 08, 1960  Today's Date: 02/18/2013 Time: 7829-5621 Time Calculation (min): 47 min  Short Term Goals: Week 2:  PT Short Term Goal 1 (Week 2): Pt will perform squat pivot transfers R and L supervision PT Short Term Goal 2 (Week 2): Pt will perform dynamic standing balance with min A PT Short Term Goal 3 (Week 2): Pt will ambulate 50 feet with LRAD with mod A PT Short Term Goal 4 (Week 2): Pt will perform bed mobility mod I  Skilled Therapeutic Interventions/Progress Updates:    Pt received seated in w/c with brother present. Pt propelled w/c to therapy gym mod I. First half of session joined by orthotist for AFO assessment. Pt ambulated 15 ft with hemi walker mod assist. Trunk rotation, trunk flexion, limited hip flexion and knee buckling during R stance. Orthotist to bring sample AFOs for trial next therapy session to determine best orthosis to provide adequate knee stability (Blue Rocker vs Sanjuana Kava). Second half of session attempted gait in MaxiSky for body weight support and to increase pt confidence/reduce fearfulness. Pt ambulated 10 ft in [place], discontinued secondary to pt discomfort and asymmetrical harness position. Pt then ambulated 15 ft with hemi walker min-mod A  and manual facilitation/verbal cues for R hip extension, R knee extension, and upright trunk. Returned pt to room, pt transferred w/c>recliner supervision.  Pt left seated in recliner with brother present and needs in reach.   Therapy Documentation Precautions:  Precautions Precautions: Fall Precaution Comments: right hemiparesis Restrictions Weight Bearing Restrictions: No Pain: Pain Assessment Pain Assessment: No/denies pain Locomotion : Ambulation Ambulation/Gait Assistance: 3: Mod assist Wheelchair Mobility Distance: 150    See FIM for current functional status  Therapy/Group: Individual  Therapy  Rita Prom 02/18/2013, 11:44 AM

## 2013-02-18 NOTE — Progress Notes (Signed)
Occupational Therapy Session Note  Patient Details  Name: Bruce Guerrero MRN: [id number] Date of Birth: 01/18/1961  Today's Date: 02/18/2013 Time: 0815-0900 Time Calculation (min): 45 min   Skilled Therapeutic Interventions/Progress Updates:    Had a long discussion with patient with his brother present.  Apparently there was a very heated discussion that took place this morning that was very upsetting to the patient.  He rushed through his am ADL, and OT reported that she was concerned for patient's ability to remain safe while so angry/frustrated.  Patient apparently making statements about leaving the hospital today.  Patient given a 10 minute break before this therapist arrived.  By this time, patient had completely calmed down, although looked on the verge of tears.  Asked patient if there was anything that we could do as a rehab center to prevent this type of issue in the furture.  Patient very clearly indicated that he was not upset about anything in the hospital.  Brother indicated that he had shared some news that had been upsetting to the patient.  Explained to the brother that he did not need to share private. Confidential information with me, as my interest was only to ensure patient's safety, and continued particiaption in this program as he is making such good progress.  Patient willing to work in therapy.  He spoke with MD about possibly needing to look into trying some medication for anxiety.   Patient seen in 1:1 OT session to address neuromuscular re-education to right side.  Patient remains very fearful of weight shifting onto right leg, even with adequated alignment and support.  Offered support and calming influence and returned to standing activities.  Patient did best to extend knee and hip with cue to "push your feet down into the floor, "  Vs.  "stand up tall."  Patient able to flex shoulder with elbow extension, but had greater difficulty isolating shoulder extension with  elbow flexion using the UE ranger. Patient with strong weight shift backward from hips and head with each attempt to isolate shoulder extension.    Therapy Documentation Precautions:  Precautions Precautions: Fall Precaution Comments: right hemiparesis Restrictions Weight Bearing Restrictions: No   Pain:  No report of pain, although he did indicate that he has trouble with his knees recently.    See FIM for current functional status  Therapy/Group: Individual Therapy  Collier Salina 02/18/2013, 11:28 AM

## 2013-02-19 ENCOUNTER — Inpatient Hospital Stay (HOSPITAL_COMMUNITY): Payer: BC Managed Care – PPO | Admitting: Physical Therapy

## 2013-02-19 NOTE — Progress Notes (Signed)
Physical Therapy Note  Patient Details  Name: Bruce Guerrero MRN: [id number] Date of Birth: [birth year] Today's Date: 02/19/2013  1430-1500 (30 minutes) individual Pain: no complaint of pain Focus of treatment: Neuro re-ed RT LE; gait training Treatment: Pt in recliner upon arrival; squat/pivot transfer close SBA; wc mobility 120 feet SBA using left extremities ; Neuro re-ed RT LE - hip flexion /extension in sidelying (gravity eliminated) with manual resistance as tolerated 3 X 10 ; supine AA hip abduction; gait 20 feet X 3 with RW + Blue rocker AFO  + hand splint on RW min /mod assist with tactile cues at shoulder to facilitate Rt hip extension in stance; returned to room with all needs within reach;.   Kindal Ponti,JIM 02/19/2013, 3:01 PM

## 2013-02-19 NOTE — Progress Notes (Signed)
52 y.o. right-handed male with history of hypertension. Patient was independent prior to admission working full time. Admitted 02/01/2013 with right-sided weakness and slurred speech. MRI of the brain showed a moderately enlarged deep white matter infarction on the left. MRA of the head with no large vessel occlusion identified. Carotid Dopplers with no ICA stenosis. Echocardiogram with ejection fraction of 60% grade 1 diastolic dysfunction. Patient did not receive TPA. Neurology services consulted presently maintained on Plavix for CVA prophylaxis with the addition of subcutaneous heparin for DVT prophylaxis. He is tolerating a regular diet  Subjective/Complaints: No issues overnite.  Appreciate SW note  OT feels patient may be able to discharge earlier than projected date.  Review of systems: Negative except for weakness on the right side  Objective: Vital Signs: Blood pressure 122/67, pulse 60, temperature 97.8 F (36.6 C), temperature source Oral, resp. rate 18, height 5\' 4"  (1.626 m), weight 80.9 kg (178 lb 5.6 oz), SpO2 97.00%. No results found. No results found for this or any previous visit (from the past 72 hour(s)).   HEENT: normal Cardio: RRR and no murmurs Resp: CTA B/L and unlabored GI: BS positive and non tender, nondistended Extremity:  Pulses positive and No Edema Skin:   Intact Neuro: Alert/Oriented, Flat, Cranial Nerve Abnormalities Right central 7, Normal Sensory, Abnormal Motor Trace right shoulder adduction, trace right hip extension, 2- R knee ext, 0/5 ankle and Dysarthric, 5/5 on Left side Musc/Skel:  Normal and Other No shoulder pain with range of motion, no knee effusion noted General no acute distress   Assessment/Plan: 1. Functional deficits secondary to Left subcortical infarct with severe right hemiplegia which require 3+ hours per day of interdisciplinary therapy in a comprehensive inpatient rehab setting. Physiatrist is providing close team supervision  and 24 hour management of active medical problems listed below. Physiatrist and rehab team continue to assess barriers to discharge/monitor patient progress toward functional and medical goals. FIM: FIM - Bathing Bathing Steps Patient Completed: Chest;Right Arm;Abdomen;Front perineal area;Right upper leg;Left upper leg;Buttocks Bathing: 3: Mod-Patient completes 5-7 65f 10 parts or 50-74%  FIM - Upper Body Dressing/Undressing Upper body dressing/undressing steps patient completed: Thread/unthread left sleeve of pullover shirt/dress;Put head through opening of pull over shirt/dress;Pull shirt over trunk Upper body dressing/undressing: 4: Min-Patient completed 75 plus % of tasks FIM - Lower Body Dressing/Undressing Lower body dressing/undressing steps patient completed: Thread/unthread right underwear leg;Thread/unthread left underwear leg;Pull underwear up/down;Thread/unthread right pants leg;Thread/unthread left pants leg;Don/Doff right sock;Don/Doff left sock;Don/Doff left shoe;Fasten/unfasten left shoe Lower body dressing/undressing: 4: Min-Patient completed 75 plus % of tasks  FIM - Toileting Toileting steps completed by patient: Adjust clothing prior to toileting;Performs perineal hygiene Toileting Assistive Devices: Grab bar or rail for support Toileting: 3: Mod-Patient completed 2 of 3 steps  FIM - Diplomatic Services operational officer Devices: Grab bars;Elevated toilet seat Toilet Transfers: 0-Activity did not occur  FIM - Banker Devices: Arm rests Bed/Chair Transfer: 5: Supine > Sit: Supervision (verbal cues/safety issues);5: Sit > Supine: Supervision (verbal cues/safety issues);4: Bed > Chair or W/C: Min A (steadying Pt. > 75%);4: Chair or W/C > Bed: Min A (steadying Pt. > 75%)  FIM - Locomotion: Wheelchair Distance: 150 Locomotion: Wheelchair: 6: Travels 150 ft or more, turns around, maneuvers to table, bed or toilet, negotiates 3%  grade: maneuvers on rugs and over door sills independently FIM - Locomotion: Ambulation Locomotion: Ambulation Assistive Devices: Environmental consultant - Hemi;MaxiSky Ambulation/Gait Assistance: 3: Mod assist Locomotion: Ambulation: 1: Travels less than 50  ft with moderate assistance (Pt: 50 - 74%)  Comprehension Comprehension Mode: Auditory Comprehension: 5-Follows basic conversation/direction: With no assist  Expression Expression Mode: Verbal Expression: 5-Expresses basic needs/ideas: With extra time/assistive device  Social Interaction Social Interaction: 5-Interacts appropriately 90% of the time - Needs monitoring or encouragement for participation or interaction.  Problem Solving Problem Solving: 5-Solves complex 90% of the time/cues < 10% of the time  Memory Memory: 6-Assistive device: No helper  Medical Problem List and Plan:  1. Thrombotic left subcortical white matter infarct Right hemiplegia pure motor 2. DVT Prophylaxis/Anticoagulation: Subcutaneous heparin. Monitor platelet counts any signs of bleeding  3. Pain Management: Hydrocodone as needed. Monitor with increased mobility  4. Mood/anxiety. Ativan 0.5 mg every 6 hours as needed.  5. Neuropsych: This patient is capable of making decisions on his own behalf.  6. Hypertension. Norvasc 10 mg daily, Coreg 12.5 mg twice a day. Monitor with increased mobility  7. History Right knee pain suspect chondromalacia rather than gout 8. Hyperlipidemia. Lipitor  9.  Anxiety rec SSRI +/- prn benzo (while i hospital)  LOS (Days) 11 A FACE TO FACE EVALUATION WAS PERFORMED  KIRSTEINS,ANDREW E 02/19/2013, 8:58 AM

## 2013-02-20 ENCOUNTER — Inpatient Hospital Stay (HOSPITAL_COMMUNITY): Payer: BC Managed Care – PPO | Admitting: Physical Therapy

## 2013-02-20 MED ORDER — CARVEDILOL 6.25 MG PO TABS
6.2500 mg | ORAL_TABLET | Freq: Two times a day (BID) | ORAL | Status: DC
  Administered 2013-02-20 – 2013-02-26 (×12): 6.25 mg via ORAL
  Filled 2013-02-20 (×14): qty 1

## 2013-02-20 NOTE — Progress Notes (Signed)
Physical Therapy Note  Patient Details  Name: CLANTON EMANUELSON MRN: [id number] Date of Birth: 30-Jan-1961 Today's Date: 02/20/2013  1400-1455 (55 minutes) individual Pain: no complaint of pain Focus of treatment: Therapeutic exercise focused on RT knee extension control in stance; gait training Treatment: Pt up in recliner upon arrival; transfer squat/pivot min assist; Nustep Level 4 LEs only for RT LE strengthening/activity tolerance; up/down 2 inch step RT LE with RW support 3 x 10 for hip/knee extension control in stance; gait with shopping cart 40 feet using poles to increase RT hip extension in stance; gait with RW + hand splint right min/mod assist with decreased RT hip extension in stance/ forward trunk.    Britni Driscoll,JIM 02/20/2013, 2:05 PM

## 2013-02-20 NOTE — Progress Notes (Signed)
52 y.o. right-handed male with history of hypertension. Patient was independent prior to admission working full time. Admitted 02/01/2013 with right-sided weakness and slurred speech. MRI of the brain showed a moderately enlarged deep white matter infarction on the left. MRA of the head with no large vessel occlusion identified. Carotid Dopplers with no ICA stenosis. Echocardiogram with ejection fraction of 60% grade 1 diastolic dysfunction. Patient did not receive TPA. Neurology services consulted presently maintained on Plavix for CVA prophylaxis with the addition of subcutaneous heparin for DVT prophylaxis. He is tolerating a regular diet  Subjective/Complaints: Discussed BP and HR issues  Review of systems: Negative except for weakness on the right side  Objective: Vital Signs: Blood pressure 133/72, pulse 58, temperature 97.7 F (36.5 C), temperature source Oral, resp. rate 18, height 5\' 4"  (1.626 m), weight 80.9 kg (178 lb 5.6 oz), SpO2 97.00%. No results found. No results found for this or any previous visit (from the past 72 hour(s)).   HEENT: normal Cardio: RRR and no murmurs Resp: CTA B/L and unlabored GI: BS positive and non tender, nondistended Extremity:  Pulses positive and No Edema Skin:   Intact Neuro: Alert/Oriented, Flat, Cranial Nerve Abnormalities Right central 7, Normal Sensory, Abnormal Motor Trace right shoulder adduction, trace right hip extension, 2- R knee ext, 0/5 ankle and Dysarthric, 5/5 on Left side Musc/Skel:  Normal and Other No shoulder pain with range of motion, no knee effusion noted General no acute distress   Assessment/Plan: 1. Functional deficits secondary to Left subcortical infarct with severe right hemiplegia which require 3+ hours per day of interdisciplinary therapy in a comprehensive inpatient rehab setting. Physiatrist is providing close team supervision and 24 hour management of active medical problems listed below. Physiatrist and rehab  team continue to assess barriers to discharge/monitor patient progress toward functional and medical goals. FIM: FIM - Bathing Bathing Steps Patient Completed: Chest;Right Arm;Abdomen;Front perineal area;Right upper leg;Left upper leg;Buttocks Bathing: 3: Mod-Patient completes 5-7 49f 10 parts or 50-74%  FIM - Upper Body Dressing/Undressing Upper body dressing/undressing steps patient completed: Thread/unthread left sleeve of pullover shirt/dress;Put head through opening of pull over shirt/dress;Pull shirt over trunk Upper body dressing/undressing: 4: Min-Patient completed 75 plus % of tasks FIM - Lower Body Dressing/Undressing Lower body dressing/undressing steps patient completed: Thread/unthread right underwear leg;Thread/unthread left underwear leg;Pull underwear up/down;Thread/unthread right pants leg;Thread/unthread left pants leg;Don/Doff right sock;Don/Doff left sock;Don/Doff left shoe;Fasten/unfasten left shoe Lower body dressing/undressing: 4: Min-Patient completed 75 plus % of tasks  FIM - Toileting Toileting steps completed by patient: Adjust clothing prior to toileting;Performs perineal hygiene Toileting Assistive Devices: Grab bar or rail for support Toileting: 3: Mod-Patient completed 2 of 3 steps  FIM - Diplomatic Services operational officer Devices: Grab bars;Elevated toilet seat Toilet Transfers: 0-Activity did not occur  FIM - Banker Devices: Arm rests Bed/Chair Transfer: 5: Supine > Sit: Supervision (verbal cues/safety issues);5: Sit > Supine: Supervision (verbal cues/safety issues);4: Bed > Chair or W/C: Min A (steadying Pt. > 75%);4: Chair or W/C > Bed: Min A (steadying Pt. > 75%)  FIM - Locomotion: Wheelchair Distance: 150 Locomotion: Wheelchair: 6: Travels 150 ft or more, turns around, maneuvers to table, bed or toilet, negotiates 3% grade: maneuvers on rugs and over door sills independently FIM - Locomotion:  Ambulation Locomotion: Ambulation Assistive Devices: Environmental consultant - Hemi;MaxiSky Ambulation/Gait Assistance: 3: Mod assist Locomotion: Ambulation: 1: Travels less than 50 ft with moderate assistance (Pt: 50 - 74%)  Comprehension Comprehension Mode: Auditory Comprehension: 5-Follows  basic conversation/direction: With no assist  Expression Expression Mode: Verbal Expression: 5-Expresses basic needs/ideas: With extra time/assistive device  Social Interaction Social Interaction: 5-Interacts appropriately 90% of the time - Needs monitoring or encouragement for participation or interaction.  Problem Solving Problem Solving: 5-Solves complex 90% of the time/cues < 10% of the time  Memory Memory: 6-Assistive device: No helper  Medical Problem List and Plan:  1. Thrombotic left subcortical white matter infarct Right hemiplegia pure motor 2. DVT Prophylaxis/Anticoagulation: Subcutaneous heparin. Monitor platelet counts any signs of bleeding  3. Pain Management: Hydrocodone as needed. Monitor with increased mobility  4. Mood/anxiety. Ativan 0.5 mg every 6 hours as needed.  5. Neuropsych: This patient is capable of making decisions on his own behalf.  6. Hypertension. Norvasc 10 mg daily, reduce Coreg 6.25 mg twice a day. Monitor with increased mobility  7. History Right knee pain suspect chondromalacia rather than gout 8. Hyperlipidemia. Lipitor  9.  Anxiety rec SSRI +/- prn benzo (while i hospital) 10.  Bradycardia- likely coreg will reduce dose LOS (Days) 12 A FACE TO FACE EVALUATION WAS PERFORMED  Allah Reason E 02/20/2013, 9:02 AM

## 2013-02-21 ENCOUNTER — Encounter (HOSPITAL_COMMUNITY): Payer: BC Managed Care – PPO | Admitting: Occupational Therapy

## 2013-02-21 ENCOUNTER — Inpatient Hospital Stay (HOSPITAL_COMMUNITY): Payer: BC Managed Care – PPO | Admitting: Physical Therapy

## 2013-02-21 ENCOUNTER — Inpatient Hospital Stay (HOSPITAL_COMMUNITY): Payer: BC Managed Care – PPO | Admitting: Occupational Therapy

## 2013-02-21 ENCOUNTER — Encounter (HOSPITAL_COMMUNITY): Payer: BC Managed Care – PPO

## 2013-02-21 DIAGNOSIS — G8110 Spastic hemiplegia affecting unspecified side: Secondary | ICD-10-CM

## 2013-02-21 DIAGNOSIS — M109 Gout, unspecified: Secondary | ICD-10-CM

## 2013-02-21 DIAGNOSIS — I6330 Cerebral infarction due to thrombosis of unspecified cerebral artery: Secondary | ICD-10-CM

## 2013-02-21 DIAGNOSIS — M25569 Pain in unspecified knee: Secondary | ICD-10-CM

## 2013-02-21 DIAGNOSIS — I10 Essential (primary) hypertension: Secondary | ICD-10-CM

## 2013-02-21 NOTE — Progress Notes (Signed)
I have reviewed and I agree with the following treatment notes.  Edman Circle, PT, DPT

## 2013-02-21 NOTE — Progress Notes (Signed)
Physical Therapy Session Note  Patient Details  Name: Bruce Guerrero MRN: [id number] Date of Birth: 1960-07-28  Today's Date: 02/21/2013 Time: 0900-1003, 4098-1191 Time Calculation (min): 63 min, 40 min  Short Term Goals: Week 2:  PT Short Term Goal 1 (Week 2): Pt will perform squat pivot transfers R and L supervision PT Short Term Goal 2 (Week 2): Pt will perform dynamic standing balance with min A PT Short Term Goal 3 (Week 2): Pt will ambulate 50 feet with LRAD with mod A PT Short Term Goal 4 (Week 2): Pt will perform bed mobility mod I  Skilled Therapeutic Interventions/Progress Updates:    AM session: Pt received seated in w/c in therapy gym after OT session. Pt transferred w/c>mat squat pivot supervision and sit>L side lying mod I. Performed R hip flexor, hamstring, and gastroc stretch. Increased mobility and flexibility noted vs previous sessions. Pt reported he has been taking his baclofen regularly. Pt performed Kinetron in sitting 2x10 and standing 2x10, with manual facilitation for knee extension, hip extension, and full weight shift onto RLE. Pt ambulated 65 ft with RW with R walker splint and R AFO min A. Manual facilitation and verbal cues for R hip extension during stance and anterior rotation when advancing limb. Pt propelled w/c with RLE only forward x25' and backward x25' for hamstring and quadricep activation. Pt propelled w/c 150 feet to room utilizing hemi technique mod I. Pt transferred w/c>recliner squat pivot supervision. Pt left seated in recliner with needs in reach.   PM session: Pt received seated in w/c in therapy gym after OT session. Pt transferred w/c>mat stand pivot supervision. Pt performed R terminal hip extension in supine with RLE off mat and R foot supported on block 2x10. Manual facilitation for glut activation and anterior pelvic rotation and verbal cues to keep LLE relaxed. Pt ambulated 75 ft x1 and 25 ft x1 with RW with R walker splint and R AFO min A.  Details below. Pt performed trunk NMR in sitting including  forward trunk lean and L and R trunk rotation with BUE on therapist shoulders. Pt propelled w/c to room and transferred w/c>recliner stand pivot supervision. Pt left seated in recliner with needs in reach.    Therapy Documentation Precautions:  Precautions Precautions: Fall Precaution Comments: right hemiparesis Restrictions Weight Bearing Restrictions: No   Pain: Pain Assessment Pain Assessment: No/denies pain Locomotion : Ambulation Ambulation/Gait Assistance: 4: Min assist Ambulation Distance (Feet): session 1: 65 feet. Session 2: 75 feet x1, 25 feet x1 Assistive device: Rolling walker Ambulation/Gait Assistance Details: Manual facilitation for placement;Manual facilitation for weight shifting;Tactile cues for posture;Verbal cues for technique Ambulation/Gait Assistance Details: Manual facilitation for R hip extension and anterior rotation, facilitation at R trunk for anterior rotation progressing to resistance applied anterior trunk to facilitate forward rotation/full weight shift.  See FIM for current functional status  Therapy/Group: Individual Therapy  Makayle Krahn 02/21/2013, 3:20 PM

## 2013-02-21 NOTE — Progress Notes (Addendum)
Occupational Therapy Session Note  Patient Details  Name: [name] MRN: [id number] Date of Birth: 1960/11/01  Today's Date: 02/21/2013 Time: 0800-0858 Time Calculation (min): 58 min  Session 2: Time 14:05-14:31 Time Calculation (min):  26 mins  Short Term Goals: Week 2:  OT Short Term Goal 1 (Week 2): Pt will donn pullover shirt with supervision 2/3 sessions. OT Short Term Goal 2 (Week 2): Pt will perform all bathing sit to stand with min assist. OT Short Term Goal 3 (Week 2): Pt will perform toilet transfers stand pivot with min assist. OT Short Term Goal 4 (Week 2): Pt will perform LB dressing with min assist. OT Short Term Goal 5 (Week 2): Pt will use the RUE as a stabilizer with min assist during bathing tasks.  Skilled Therapeutic Interventions/Progress Updates:    Session 1: Pt rolled his wheelchair down to the gym to work on the RUE.  Therapist applied NMES to RUE digit flexors and extensors and wrapped ace bandage around his forearm.  Extensor intensity set at level 32 and flexors set at level 22.  Pt worked in supine with AAROM elbow flexion and extension as stimulation to flexors and extensors alternated.  Pt needed max assistance for elbow flexion and mod assistance for elbow extension.  Also incorporated shoulder flexion as well with stimulation.  Performed 25 mins of total stimulation with AAROM incorporated.  Pt transitioned back to sitting and used the UE ranger for small movements of internal and external rotation as well as flexion.  He needs mod facilitation to avoid trunk compensations and propping on the LUE.    Session 2: Had pt roll down to the gym and work on using the UE ergonometer for reciprical RUE motion.  Used ace bandage for wrapping his RUE on the right handle of the ergonometer.  Began with pt using bilateral UEs with resistance set at level 10 and RPMs kept at 35 to 40.  Transitioned resistance back to level 1 while using just the RUE for 3 more sets  of 2 mins each.  Pt needing mod facilitation to use just the RUE.  He was able to initiate some elbow and shoulder extension once therapist helped him position the handle at the top.  He continues to need mod facilitation at the trunk to avoid overcompensation when attempting UE movements.  Therapy Documentation Precautions:  Precautions Precautions: Fall Precaution Comments: right hemiparesis Restrictions Weight Bearing Restrictions: No  Pain: Pain Assessment Pain Assessment: No/denies pain ADL: See FIM for current functional status  Therapy/Group: Individual Therapy  Rahkeem Senft OTR/L 02/21/2013, 1:53 PM

## 2013-02-21 NOTE — Progress Notes (Signed)
52 y.o. right-handed male with history of hypertension. Patient was independent prior to admission working full time. Admitted 02/01/2013 with right-sided weakness and slurred speech. MRI of the brain showed a moderately enlarged deep white matter infarction on the left. MRA of the head with no large vessel occlusion identified. Carotid Dopplers with no ICA stenosis. Echocardiogram with ejection fraction of 60% grade 1 diastolic dysfunction. Patient did not receive TPA. Neurology services consulted presently maintained on Plavix for CVA prophylaxis with the addition of subcutaneous heparin for DVT prophylaxis. He is tolerating a regular diet  Subjective/Complaints: Discussed upper ext movement and exercises  Review of systems: Negative except for weakness on the right side  Objective: Vital Signs: Blood pressure 145/73, pulse 55, temperature 97.9 F (36.6 C), temperature source Oral, resp. rate 18, height 5\' 4"  (1.626 m), weight 80.9 kg (178 lb 5.6 oz), SpO2 99.00%. No results found. No results found for this or any previous visit (from the past 72 hour(s)).   HEENT: normal Cardio: RRR and no murmurs Resp: CTA B/L and unlabored GI: BS positive and non tender, nondistended Extremity:  Pulses positive and No Edema Skin:   Intact Neuro: Alert/Oriented, Flat, Cranial Nerve Abnormalities Right central 7, Normal Sensory, Abnormal Motor Trace right shoulder adduction, trace right hip extension, 2- R knee ext, 0/5 ankle and Dysarthric, 5/5 on Left side Musc/Skel:  Normal and Other No shoulder pain with range of motion, no knee effusion noted General no acute distress   Assessment/Plan: 1. Functional deficits secondary to Left subcortical infarct with severe right hemiplegia which require 3+ hours per day of interdisciplinary therapy in a comprehensive inpatient rehab setting. Physiatrist is providing close team supervision and 24 hour management of active medical problems listed  below. Physiatrist and rehab team continue to assess barriers to discharge/monitor patient progress toward functional and medical goals. FIM: FIM - Bathing Bathing Steps Patient Completed: Chest;Right Arm;Abdomen;Front perineal area;Right upper leg Bathing: 3: Mod-Patient completes 5-7 53f 10 parts or 50-74%  FIM - Upper Body Dressing/Undressing Upper body dressing/undressing steps patient completed: Thread/unthread left sleeve of pullover shirt/dress;Put head through opening of pull over shirt/dress;Pull shirt over trunk Upper body dressing/undressing: 4: Min-Patient completed 75 plus % of tasks FIM - Lower Body Dressing/Undressing Lower body dressing/undressing steps patient completed: Thread/unthread right underwear leg;Thread/unthread left underwear leg;Pull underwear up/down;Thread/unthread right pants leg;Thread/unthread left pants leg;Don/Doff right sock;Don/Doff left sock Lower body dressing/undressing: 4: Min-Patient completed 75 plus % of tasks  FIM - Toileting Toileting steps completed by patient: Adjust clothing prior to toileting;Performs perineal hygiene Toileting Assistive Devices: Grab bar or rail for support Toileting: 3: Mod-Patient completed 2 of 3 steps  FIM - Diplomatic Services operational officer Devices: Grab bars;Elevated toilet seat Toilet Transfers: 0-Activity did not occur  FIM - Banker Devices: Arm rests Bed/Chair Transfer: 5: Supine > Sit: Supervision (verbal cues/safety issues);5: Sit > Supine: Supervision (verbal cues/safety issues);4: Bed > Chair or W/C: Min A (steadying Pt. > 75%);4: Chair or W/C > Bed: Min A (steadying Pt. > 75%)  FIM - Locomotion: Wheelchair Distance: 150 Locomotion: Wheelchair: 6: Travels 150 ft or more, turns around, maneuvers to table, bed or toilet, negotiates 3% grade: maneuvers on rugs and over door sills independently FIM - Locomotion: Ambulation Locomotion: Ambulation Assistive  Devices: Environmental consultant - Hemi;MaxiSky Ambulation/Gait Assistance: 3: Mod assist Locomotion: Ambulation: 1: Travels less than 50 ft with moderate assistance (Pt: 50 - 74%)  Comprehension Comprehension Mode: Auditory Comprehension: 5-Follows basic conversation/direction: With no assist  Expression Expression Mode: Verbal Expression: 5-Expresses basic needs/ideas: With extra time/assistive device  Social Interaction Social Interaction: 5-Interacts appropriately 90% of the time - Needs monitoring or encouragement for participation or interaction.  Problem Solving Problem Solving: 5-Solves complex 90% of the time/cues < 10% of the time  Memory Memory: 6-Assistive device: No helper  Medical Problem List and Plan:  1. Thrombotic left subcortical white matter infarct Right hemiplegia pure motor 2. DVT Prophylaxis/Anticoagulation: Subcutaneous heparin. Monitor platelet counts any signs of bleeding  3. Pain Management: Hydrocodone as needed. Monitor with increased mobility  4. Mood/anxiety. Ativan 0.5 mg every 6 hours as needed.  5. Neuropsych: This patient is capable of making decisions on his own behalf.  6. Hypertension. Norvasc 10 mg daily, reduce Coreg 6.25 mg twice a day. Monitor with increased mobility  7. History Right knee pain suspect chondromalacia rather than gout 8. Hyperlipidemia. Lipitor  9.  Anxiety rec SSRI +/- prn benzo (while i hospital) 10.  Bradycardia- likely coreg will reduce dose LOS (Days) 13 A FACE TO FACE EVALUATION WAS PERFORMED  Bruce Guerrero E 02/21/2013, 6:31 AM

## 2013-02-21 NOTE — Progress Notes (Deleted)
Patient had uneventful evening/night.  Slept soundly.  No signs of discomfort or distress.  Remained continent of bowel and bladder throughout this shift.  Currently in bathroom.  Alert and oriented to person and place.  Disoriented to time.  Refused laxative earlier in the shift.  Requesting prune juice with breakfast.  Will report to oncoming shift.    Bruce Guerrero

## 2013-02-22 ENCOUNTER — Inpatient Hospital Stay (HOSPITAL_COMMUNITY): Payer: BC Managed Care – PPO | Admitting: Physical Therapy

## 2013-02-22 ENCOUNTER — Inpatient Hospital Stay (HOSPITAL_COMMUNITY): Payer: BC Managed Care – PPO | Admitting: Occupational Therapy

## 2013-02-22 ENCOUNTER — Encounter (HOSPITAL_COMMUNITY): Payer: BC Managed Care – PPO | Admitting: Occupational Therapy

## 2013-02-22 NOTE — Progress Notes (Signed)
Occupational Therapy Session Note  Patient Details  Name: Bruce Guerrero MRN: [id number] Date of Birth: [birth year]  Today's Date: 02/22/2013 Time: 7846-9629 Time Calculation (min): 31 min  Skilled Therapeutic Interventions/Progress Updates:    Pt performed neuromuscular re-education on the RUE while sitting on the EOB.  Utilized tilted stool to work on isolated shoulder flexion and elbow extension.  He was able to push the tilted stool forward to a target repeatedly with only min compensation at the trunk.  Adjusted position of stool to focus more on shoulder flexion and slight external rotation of the humerus as well as pt tends to overpower shoulder movements with his chest leading to adduction. Transitioned to using sliding board and washcloth under pt's hand to work on shoulder movements as well with emphasis on pushing the washcloth forward on the board without letting his arm/hand adduct off of the inside of the board.  Pt needed min to mod facilitation to avoid this.  Also positioned crumbled up paper towels on the board and had pt work on sliding his hand forward on the washcloth to push the paper towels off of the end of the board into the trash can.   Therapy Documentation Precautions:  Precautions Precautions: Fall Precaution Comments: right hemiparesis Restrictions Weight Bearing Restrictions: No  Pain: Pain Assessment Pain Assessment: No/denies pain  See FIM for current functional status  Therapy/Group: Individual Therapy  Asmara Backs OTR/L 02/22/2013, 3:42 PM

## 2013-02-22 NOTE — Progress Notes (Signed)
I have reviewed and I agree with the following treatment note.  Dedria Endres Hall, PT, DPT  

## 2013-02-22 NOTE — Consult Note (Signed)
NEUROCOGNITIVE TESTING - [facility]IDENT[facility]   MEDICAL NECESSITY:  Bruce Guerrero was seen on the [facility] for an initial diagnostic evaluation owing to the patient's diagnosis of stroke.   According to medical records, Bruce Guerrero was admitted to the rehab unit owing to "functional deficits secondary to left subcortical infarct with severe right hemiplegia." MRI of the brain showed a deep white matter infarction on the left. MRA of the head showed no large vessel occlusion. No ICA stenosis. Echocardiogram with ejection fraction of 60% grade 1 diastolic dysfunction. Patient did not receive TPA. Neurology services were consulted and the patient is maintained on Plavix for CVA prophylaxis with the addition of subcutaneous heparin for DVT prophylaxis.   Bruce Guerrero was previously seen by this provider on 02/14/2013. Results from that appointment were as follows:   Overall, Bruce Guerrero denies experiencing any major issues with cognition or mental health. He seems to be experiencing a normal emotional reaction to having had a stroke. No treatment or follow-up for mood appears necessary. However, cognitive testing may be valuable to assess for possible cognitive changes post stroke. This will be set up for later this week with my colleague, Dr. Wylene Simmer, or with me the following Monday. Further recommendations will follow that assessment.    No new complaints endorsed at the time of this visit.   PROCEDURES: [2 units of 36644 on 02/21/13]  Diagnostic Interview Medical record review Behavioral observations  Neuropsychological testing:  Repeatable Battery for the Assessment of Neuropsychological Status (form A)  Of note, Guerrero tasks could not be administered due to right-hand motor limitations.   TEST RESULTS:   RBANS Indices Scaled Score Percentile Description  Immediate Memory  83 13 Below average  Visuospatial/Constructional  N/A N/A N/A  Language 84 14 Below average  Attention N/A N/A N/A  Delayed Memory N/A N/A N/A  Total Score N/A N/A N/A    RBANS Subtests Raw Score Percentile Description  List Learning 22 12 Below average  Story Memory 15 25 Average  Figure Copy N/A N/A N/A  Line Orientation 16 45 Average  Picture Naming 10 75 Above average  Semantic Fluency 14 8 Borderline impaired  Digit Span 10 39 Average  Coding N/A N/A N/A  List Recall 1 1 Markedly impaired  List Recognition 19 32 Average  Story Recall 1 <1 Markedly impaired  Figure recall N/A N/A N/A   Cognitive Evaluation: Test results revealed predominant issues with slowed processing speed, rote verbal learning, verbal free recall, and contextual memory. Of note, Guerrero tasks could not be administered owing to right hemiplegia. As such, Guerrero deficits may remain masked.   Emotional & Behavioral Evaluation: Bruce Guerrero was appropriately dressed for season and situation, and he appeared tidy and well-groomed. Normal posture was noted, but he exhibited right-sided weakness that adversely impacted his ability to write. He was friendly and rapport was easily established. His speech was as expected and he was able to express ideas effectively. He seemed to understand test directions readily. His affect was appropriately modulated. Attention and motivation were good. Optimal test taking conditions were maintained.  From an emotional standpoint, Bruce Guerrero denied suffering from any major symptoms of clinical psychopathology. Suicidal/homicidal ideation, plan or intent was denied. No manic or hypomanic episodes were reported. The patient denied ever experiencing any auditory/visual hallucinations. No major behavioral or personality changes were endorsed.    Overall, Bruce Guerrero appears to be suffering from at least a moderate degree of cognitive  dysfunction post stroke. His issues are mainly subcortical in nature and include deficits in processing  speed and Guerrero aspects of learning and memory. No major mood factors were endorsed with the exception of normal frustration owing to his present medical situation.   In light of these findings, the following recommendations are provided.    RECOMMENDATIONS  Recommendations for treatment team:    When interacting with Bruce Guerrero, directions and information should be provided in a simple, straight forward manner, and the treatment team should avoid giving multiple instructions simultaneously.    He may also benefit from being provided with multiple trials to learn new skills given the noted memory inefficiencies.    To the extent possible, multitasking should be avoided.   He requires more time than typical to process information. The treatment team may benefit from waiting for a verbal response to information before presenting additional information.    Performance will generally be best in a structured, routine, and familiar environment, as opposed to situations involving complex problems.    Bruce Guerrero does appear to be able to make the most appropriate/competent decisions regarding his care.    Recommendations for discharge planning:    Complete a comprehensive neuropsychological evaluation as an outpatient in 8-12 months to assess for interval change. This can be done through Bruce Fisherman, PsyD by calling the following number: [phone].    Establish long-term follow-up care with a provider knowledgeable in stroke.    Maintain engagement in mentally, physically and cognitively stimulating activities.    Strive to maintain a healthy lifestyle (e.g., proper diet and exercise) in order to promote physical, cognitive and emotional health.    Due to the nature and severity of the symptoms noted during this evaluation, it is recommended that he initially obtain constant care and supervision following this hospitalization.    Bruce Guerrero cognitive symptoms place him at a vocational  disadvantage.    The patient should refrain from driving at this time.       Debbe Mounts, Psy.D.  Clinical Neuropsychologist

## 2013-02-22 NOTE — Progress Notes (Signed)
52 y.o. right-handed male with history of hypertension. Patient was independent prior to admission working full time. Admitted 02/01/2013 with right-sided weakness and slurred speech. MRI of the brain showed a moderately enlarged deep white matter infarction on the left. MRA of the head with no large vessel occlusion identified. Carotid Dopplers with no ICA stenosis. Echocardiogram with ejection fraction of 60% grade 1 diastolic dysfunction. Patient did not receive TPA. Neurology services consulted presently maintained on Plavix for CVA prophylaxis with the addition of subcutaneous heparin for DVT prophylaxis. He is tolerating a regular diet  Subjective/Complaints: Slept OK No bowel or bladder c/os  Review of systems: Negative except for weakness on the right side  Objective: Vital Signs: Blood pressure 122/63, pulse 56, temperature 98.1 F (36.7 C), temperature source Oral, resp. rate 18, height 5\' 4"  (1.626 m), weight 80.9 kg (178 lb 5.6 oz), SpO2 97.00%. No results found. No results found for this or any previous visit (from the past 72 hour(s)).   HEENT: normal Cardio: RRR and no murmurs Resp: CTA B/L and unlabored GI: BS positive and non tender, nondistended Extremity:  Pulses positive and No Edema Skin:   Intact Neuro: Alert/Oriented, Flat, Cranial Nerve Abnormalities Right central 7, Normal Sensory, Abnormal Motor Trace right shoulder adduction, trace right hip extension, 2- R knee ext, 0/5 ankle and Dysarthric, 5/5 on Left side Musc/Skel:  Normal and Other No shoulder pain with range of motion, no knee effusion noted General no acute distress   Assessment/Plan: 1. Functional deficits secondary to Left subcortical infarct with severe right hemiplegia which require 3+ hours per day of interdisciplinary therapy in a comprehensive inpatient rehab setting. Physiatrist is providing close team supervision and 24 hour management of active medical problems listed below. Physiatrist and  rehab team continue to assess barriers to discharge/monitor patient progress toward functional and medical goals. FIM: FIM - Bathing Bathing Steps Patient Completed: Chest;Right Arm;Abdomen;Front perineal area;Right upper leg Bathing: 3: Mod-Patient completes 5-7 37f 10 parts or 50-74%  FIM - Upper Body Dressing/Undressing Upper body dressing/undressing steps patient completed: Thread/unthread left sleeve of pullover shirt/dress;Put head through opening of pull over shirt/dress;Pull shirt over trunk Upper body dressing/undressing: 4: Min-Patient completed 75 plus % of tasks FIM - Lower Body Dressing/Undressing Lower body dressing/undressing steps patient completed: Thread/unthread right underwear leg;Thread/unthread left underwear leg;Pull underwear up/down;Thread/unthread right pants leg;Thread/unthread left pants leg;Don/Doff right sock;Don/Doff left sock Lower body dressing/undressing: 4: Min-Patient completed 75 plus % of tasks  FIM - Toileting Toileting steps completed by patient: Adjust clothing prior to toileting;Performs perineal hygiene Toileting Assistive Devices: Grab bar or rail for support Toileting: 3: Mod-Patient completed 2 of 3 steps  FIM - Diplomatic Services operational officer Devices: Grab bars;Elevated toilet seat Toilet Transfers: 0-Activity did not occur  FIM - Banker Devices: Arm rests Bed/Chair Transfer: 6: Supine > Sit: No assist;6: Sit > Supine: No assist;5: Bed > Chair or W/C: Supervision (verbal cues/safety issues);5: Chair or W/C > Bed: Supervision (verbal cues/safety issues)  FIM - Locomotion: Wheelchair Distance: 150 Locomotion: Wheelchair: 6: Travels 150 ft or more, turns around, maneuvers to table, bed or toilet, negotiates 3% grade: maneuvers on rugs and over door sills independently FIM - Locomotion: Ambulation Locomotion: Ambulation Assistive Devices: Designer, industrial/product Ambulation/Gait Assistance: 4: Min  assist Locomotion: Ambulation: 2: Travels 50 - 149 ft with minimal assistance (Pt.>75%)  Comprehension Comprehension Mode: Auditory Comprehension: 5-Follows basic conversation/direction: With no assist  Expression Expression Mode: Verbal Expression: 5-Expresses basic needs/ideas: With  extra time/assistive device  Social Interaction Social Interaction: 5-Interacts appropriately 90% of the time - Needs monitoring or encouragement for participation or interaction.  Problem Solving Problem Solving: 5-Solves basic 90% of the time/requires cueing < 10% of the time  Memory Memory: 6-Assistive device: No helper  Medical Problem List and Plan:  1. Thrombotic left subcortical white matter infarct Right hemiplegia pure motor 2. DVT Prophylaxis/Anticoagulation: Subcutaneous heparin. Monitor platelet counts any signs of bleeding  3. Pain Management: Hydrocodone as needed. Monitor with increased mobility  4. Mood/anxiety. Ativan 0.5 mg every 6 hours as needed.  5. Neuropsych: This patient is capable of making decisions on his own behalf.  6. Hypertension. Norvasc 10 mg daily, reduce Coreg 6.25 mg twice a day. Monitor with increased mobility  7. History Right knee pain suspect chondromalacia rather than gout 8. Hyperlipidemia. Lipitor  9.  Anxiety rec SSRI +/- prn benzo (while i hospital) 10.  Bradycardia- likely coreg will reduce dose LOS (Days) 14 A FACE TO FACE EVALUATION WAS PERFORMED  Bruce Guerrero E 02/22/2013, 6:34 AM

## 2013-02-22 NOTE — Progress Notes (Signed)
Occupational Therapy Session Note  Patient Details  Name: Bruce Guerrero MRN: [id number] Date of Birth: 30-Jun-1960  Today's Date: 02/22/2013 Time: 1100-1200 Time Calculation (min): 60 min  Short Term Goals: Week 2:  OT Short Term Goal 1 (Week 2): Pt will donn pullover shirt with supervision 2/3 sessions. OT Short Term Goal 2 (Week 2): Pt will perform all bathing sit to stand with min assist. OT Short Term Goal 3 (Week 2): Pt will perform toilet transfers stand pivot with min assist. OT Short Term Goal 4 (Week 2): Pt will perform LB dressing with min assist. OT Short Term Goal 5 (Week 2): Pt will use the RUE as a stabilizer with min assist during bathing tasks.  Skilled Therapeutic Interventions/Progress Updates:    Pt performed shower this am sitting on the shower bench.  Pt transferred to the bench from the wheelchair with mod assist stand pivot.  He needed min assist overall for bathing with max assist to integrate the RUE for washing the left arm.  Min assist for standing balance when washing peri area.  Pt still with tendency to exhibit some flexor tone in the right hamstrings making it difficult for the right heel to maintain contact with the floor in standing.  Pt performed dressing sit to stand and the sink.  He was able to donn both pullover shirts/undershirt with setup.  Initially put the undershirt on backwards but realized it immediately and self corrected.  He needed min assist to keep his RLE crossed over the left knee while donning socks and shoes.  Also needed min assist to put his right heel in the right shoe with AFO in place.    Therapy Documentation Precautions:  Precautions Precautions: Fall Precaution Comments: right hemiparesis Restrictions Weight Bearing Restrictions: No  Pain: Pain Assessment Pain Assessment: No/denies pain Pain Score: 0-No pain ADL: See FIM for current functional status  Therapy/Group: Individual Therapy  Madeleyn Schwimmer   OTR/L 02/22/2013, 12:18 PM

## 2013-02-22 NOTE — Progress Notes (Signed)
Physical Therapy Session Note  Patient Details  Name: Bruce Guerrero MRN: [id number] Date of Birth: [birth year]  Today's Date: 02/22/2013 Time: 1330-1410  Time Calculation (min): 40 min  Short Term Goals:    STG=LTG  Skilled Therapeutic Interventions/Progress Updates:   Pt received seated in therapy gym after OT session. Pt transferred mat>w/c supervision. Pt performed gait with LiteGait system on treadmill at 0.83mph. First trial pt ambulated 5 minutes and second trial 3.5 minutes before requiring rest break secondary to fatigue. Provided manual facilitation for hip extension, weight shift during stance, and R knee flexion/foot clearance in swing. Pt initially demonstrated step-to gait pattern secondary to inadequate weight shift to R for full step length with LLE. As pt continued to ambulate, increased step length, step-through gait pattern, and improved weight shifting and hip extension noted on R. Provided intermittent facilitation for full weight shift to L to clear RLE as pt fatigued. Pt returned to room, transferred w/c>recliner supervision. Pt left seated in recliner with needs in reach and brother present.   Therapy Documentation Precautions:  Precautions Precautions: Fall Precaution Comments: right hemiparesis Restrictions Weight Bearing Restrictions: No Pain: Pain Assessment Pain Assessment: No/denies pain  See FIM for current functional status  Therapy/Group: Individual Therapy  Trinda Harlacher 02/22/2013, 2:12 PM

## 2013-02-22 NOTE — Progress Notes (Signed)
Physical Therapy Session Note  Patient Details  Name: Bruce Guerrero MRN: [id number] Date of Birth: [birth year]  Today's Date: 02/22/2013 Time: 1100-1200 Time Calculation (min): 60 min  Short Term Goals: Week 2:  PT Short Term Goal 1 (Week 2): Pt will perform squat pivot transfers R and L supervision PT Short Term Goal 2 (Week 2): Pt will perform dynamic standing balance with min A PT Short Term Goal 3 (Week 2): Pt will ambulate 50 feet with LRAD with mod A PT Short Term Goal 4 (Week 2): Pt will perform bed mobility mod I  Skilled Therapeutic Interventions/Progress Updates:   Pt received in recliner; performed squat pivot to w/c supervision.  Brother present; discussed stair set up at home for entry/exit.  Performed stair negotiation with pt x 2 reps and had brother return demonstrate sequence.  Brother reports he would like to practice it again on Thursday.  Performed NMR; see below for details.  Performed gait with RW x 100' in controlled environment and min A with intermittent verbal and tactile cues for initiation of R swing phase with anterior pelvic rotation and trunk elongation with full R hip and knee extension during R stance; decreased assistance needed today for RLE stabilization or trunk control.  Returned to room in w/c mod I and transferred to recliner with supervision.   Therapy Documentation Precautions:  Precautions Precautions: Fall Precaution Comments: right hemiparesis Restrictions Weight Bearing Restrictions: No Pain: Pain Assessment Pain Assessment: No/denies pain Locomotion : Ambulation Ambulation/Gait Assistance: 4: Min assist Ambulation Distance (Feet): 100 Feet Assistive device: Rolling walker Stairs / Additional Locomotion Stairs Assistance: 3: Mod assist with verbal and visual cues for safe stepping sequence, assistance for full R lateral weight shift, RLE stabilization and trunk control in stance Stair Management Technique: One rail Left and  Right;Step to pattern;Sideways to descend;Forwards to ascend Number of Stairs: 9 Height of Stairs: 4  Other Treatments: Treatments Neuromuscular Facilitation: Right;Upper Extremity;Lower Extremity;Forced use;Activity to increase timing and sequencing;Activity to increase sustained activation;Activity to increase lateral weight shifting;Activity to increase anterior-posterior weight shifting in squat position with bilat UE support on low mat for WB through RUE during R and L lateral weight shifting, weight shift to R with LUE flexion off of mat and R lateral weight shift during LLE forwards, retro and lateral advancement.  Pt tolerated well with multiple sitting rest breaks.    See FIM for current functional status  Therapy/Group: Individual Therapy  Edman Circle Faucette 02/22/2013, 1:11 PM

## 2013-02-23 ENCOUNTER — Encounter (HOSPITAL_COMMUNITY): Payer: BC Managed Care – PPO | Admitting: Occupational Therapy

## 2013-02-23 ENCOUNTER — Inpatient Hospital Stay (HOSPITAL_COMMUNITY): Payer: BC Managed Care – PPO | Admitting: Physical Therapy

## 2013-02-23 ENCOUNTER — Inpatient Hospital Stay (HOSPITAL_COMMUNITY): Payer: BC Managed Care – PPO | Admitting: Occupational Therapy

## 2013-02-23 NOTE — Progress Notes (Signed)
Occupational Therapy Session Note  Patient Details  Name: Bruce Guerrero MRN: [id number] Date of Birth: 1961-02-20  Today's Date: 02/23/2013 Time: 4098-1191 Time Calculation (min): 45 min  Short Term Goals: Week 2:  OT Short Term Goal 1 (Week 2): Pt will donn pullover shirt with supervision 2/3 sessions. OT Short Term Goal 2 (Week 2): Pt will perform all bathing sit to stand with min assist. OT Short Term Goal 3 (Week 2): Pt will perform toilet transfers stand pivot with min assist. OT Short Term Goal 4 (Week 2): Pt will perform LB dressing with min assist. OT Short Term Goal 5 (Week 2): Pt will use the RUE as a stabilizer with min assist during bathing tasks.  Skilled Therapeutic Interventions/Progress Updates:    Pt went down to the OT gym this session and worked on RUE functional strengthening.  Began with placing the RUE in weightbearing while reaching across midline with the LUE to grasp checkers.  Focused on activation of right trunk shortening and right elbow extension to return to midline after reaching for the checkers.  Pt tends to rely on the right side to help with return back to midline and needs mod facilitation.  Then transitioned sit to stand to place checkers in the grid with increased emphasis on weightbearing through the RLE.  Pt able to perform sit to stand with min assist but needs mod assist if therapist increased his shift to the right to rely more on the RLE.  Also worked on AROM with RUE placed on therapy ball.  Pt focusing on external rotation to help increase active movement and decrease over tightness of the pectoral.  Pt with mod compensations in the trunk with rotation to achieve external rotation.  Finished session by having pt work on shoulder flexion/elbow extension using tilted stool, and pushing it to target with min compensations through the trunk.    Therapy Documentation Precautions:  Precautions Precautions: Fall Precaution Comments: right  hemiparesis Restrictions Weight Bearing Restrictions: No  Pain: Pain Assessment Pain Assessment: No/denies pain Pain Score: 0-No pain ADL: See FIM for current functional status  Therapy/Group: Individual Therapy  Laney Louderback OTR/L 02/23/2013, 10:02 AM

## 2013-02-23 NOTE — Progress Notes (Signed)
Occupational Therapy Session Note  Patient Details  Name: Bruce Guerrero MRN: [id number] Date of Birth: 05-20-1960  Today's Date: 02/23/2013 Time: 1415-1500 Time Calculation (min): 45 min  Skilled Therapeutic Interventions/Progress Updates:    Pt went down to the ADL kitchen in his wheelchair.  Worked on standing at the countertop to cut apples in preparation for the patient christmas party.  Pt utilized adaptive cutting board to hold the apples while he cut them with the LUE.  Used the RUE to stabilize the cutting board with max assist while he diced up the apples.  Pt was able to stand for 3-4 minutes at a time before needing to sit down and rest.  Performed multiple intervals of sit to stand and standing with overall min assist while incorporating weightbearing into the RLE.  Pt unsafe at times and would sit down before removing the RUE from the counter top.  He also would not place the knife on the counter top and then reach back for the chair but would instead just sit down with the knife in his hand.  On one occasion when therapist cued him to remove his hand from the counter he abruptly picked it up and dropped it, almost dropping it on the sharp nails of the cutting board.  Pt still impulsive with movements at times.  Therapy Documentation Precautions:  Precautions Precautions: Fall Precaution Comments: right hemiparesis Restrictions Weight Bearing Restrictions: No  Pain: Pain Assessment Pain Assessment: No/denies pain ADL: See FIM for current functional status  Therapy/Group: Individual Therapy  Johanne Mcglade OTR/L 02/23/2013, 3:49 PM

## 2013-02-23 NOTE — Progress Notes (Signed)
Physical Therapy Session Note  Patient Details  Name: Bruce Guerrero MRN: [id number] Date of Birth: [birth year]  Today's Date: 02/23/2013 Time: 0907-1000 and 3086-5784 Time Calculation (min): 53 min and 39 min  Short Term Goals: Week 2:  PT Short Term Goal 1 (Week 2): Pt will perform squat pivot transfers R and L supervision PT Short Term Goal 2 (Week 2): Pt will perform dynamic standing balance with min A PT Short Term Goal 3 (Week 2): Pt will ambulate 50 feet with LRAD with mod A PT Short Term Goal 4 (Week 2): Pt will perform bed mobility mod I  Skilled Therapeutic Interventions/Progress Updates:   Pt received in w/c; performed w/c mobility x 200' mod I.  Discussed equipment needs with pt and pt measured for w/c for home and community mobility.  Performed gait training with RW through obstacle course x 50' x 2 reps with min-mod A with focus on changes in direction L and R around cones, lateral stepping to L and R with RW and stepping over low obstacle with verbal cues for safety and sequence with RW and assistance for trunk control and weight shifting to R; pt still with decreased clearance of RLE as pt fatigues and demonstrates decreased weight shift.  Performed stair negotiation training up/down 3 stairs with LUE support on rail and min A with verbal and tactile cues to maintain upright trunk posture during R stance.  Continued gait training at // bars with LUE support on bar with focus on full L lateral weight shifting and anterior trunk rotation on R for full RLE clearance and advancement by kicking box statically and then while performing gait forwards along bar x 12' x 2 + retro stepping x 12' x 2 with min A.  Returned to room mod I and transferred to recliner with supervision with verbal cues for safe set up.    PM session: pt received in recliner.  Transferred to w/c with set up assist stand pivot.  Once in gym, discussed with pt falls risk following CVA and demonstrated to pt how to  perform floor furniture transfer and discussed indications for calling EMS.  Assisted pt down to floor and pt return demonstrated floor> furniture transfer by bumping backwards onto 6" box and then bump up to mat backwards with min A.  On mat performed R hamstring training in supine with RLE on ball then bilat LE on ball performing HS curls x 10 each (unilat and bilat LE on ball); transitioned to bilat LE on ball extended and performing HS bridges on ball x 10 reps each with min A.  Attempted HS curls RLE in standing and in prone against gravity.  Unable to elicit hamstring contraction against gravity.  Transferred back to w/c supervision and pt handed off to OT.    Therapy Documentation Precautions:  Precautions Precautions: Fall Precaution Comments: right hemiparesis Restrictions Weight Bearing Restrictions: No Pain: Pain Assessment Pain Assessment: No/denies pain Pain Score: 0-No pain Locomotion : Ambulation Ambulation/Gait Assistance: 4: Min assist Wheelchair Mobility Distance: 150   See FIM for current functional status  Therapy/Group: Individual Therapy  Edman Circle [facility] 02/23/2013, 11:41 AM

## 2013-02-23 NOTE — Progress Notes (Signed)
52 y.o. right-handed male with history of hypertension. Patient was independent prior to admission working full time. Admitted 02/01/2013 with right-sided weakness and slurred speech. MRI of the brain showed a moderately enlarged deep white matter infarction on the left. MRA of the head with no large vessel occlusion identified. Carotid Dopplers with no ICA stenosis. Echocardiogram with ejection fraction of 60% grade 1 diastolic dysfunction. Patient did not receive TPA. Neurology services consulted presently maintained on Plavix for CVA prophylaxis with the addition of subcutaneous heparin for DVT prophylaxis. He is tolerating a regular diet  Subjective/Complaints: No shoulder pain No bowel or bladder c/os  Review of systems: Negative except for weakness on the right side  Objective: Vital Signs: Blood pressure 130/56, pulse 56, temperature 97.8 F (36.6 C), temperature source Oral, resp. rate 16, height 5\' 4"  (1.626 m), weight 78.699 kg (173 lb 8 oz), SpO2 96.00%. No results found. No results found for this or any previous visit (from the past 72 hour(s)).   HEENT: normal Cardio: RRR and no murmurs Resp: CTA B/L and unlabored GI: BS positive and non tender, nondistended Extremity:  Pulses positive and No Edema Skin:   Intact Neuro: Alert/Oriented, Flat, Cranial Nerve Abnormalities Right central 7, Normal Sensory, Abnormal Motor Trace right shoulder adduction, trace right hip extension, 3- R knee ext, 0/5 ankle and Dysarthric, 5/5 on Left side Musc/Skel:  Normal and Other No shoulder pain with range of motion, no knee effusion noted General no acute distress   Assessment/Plan: 1. Functional deficits secondary to Left subcortical infarct with severe right hemiplegia which require 3+ hours per day of interdisciplinary therapy in a comprehensive inpatient rehab setting. Physiatrist is providing close team supervision and 24 hour management of active medical problems listed  below. Physiatrist and rehab team continue to assess barriers to discharge/monitor patient progress toward functional and medical goals. Team conference today please see physician documentation under team conference tab, met with team face-to-face to discuss problems,progress, and goals. Formulized individual treatment plan based on medical history, underlying problem and comorbidities. FIM: FIM - Bathing Bathing Steps Patient Completed: Chest;Right Arm;Abdomen;Front perineal area;Right upper leg;Buttocks;Left upper leg;Right lower leg (including foot);Left lower leg (including foot) Bathing: 4: Steadying assist  FIM - Upper Body Dressing/Undressing Upper body dressing/undressing steps patient completed: Thread/unthread left sleeve of pullover shirt/dress;Put head through opening of pull over shirt/dress;Pull shirt over trunk;Thread/unthread right sleeve of pullover shirt/dresss Upper body dressing/undressing: 5: Supervision: Safety issues/verbal cues FIM - Lower Body Dressing/Undressing Lower body dressing/undressing steps patient completed: Thread/unthread right underwear leg;Thread/unthread left underwear leg;Pull underwear up/down;Thread/unthread right pants leg;Thread/unthread left pants leg;Don/Doff right sock;Don/Doff left sock;Fasten/unfasten left shoe;Fasten/unfasten right shoe;Don/Doff left shoe Lower body dressing/undressing: 4: Min-Patient completed 75 plus % of tasks  FIM - Toileting Toileting steps completed by patient: Adjust clothing prior to toileting;Performs perineal hygiene;Adjust clothing after toileting Toileting Assistive Devices: Grab bar or rail for support Toileting: 6: More than reasonable amount of time  FIM - Diplomatic Services operational officer Devices: Grab bars;Elevated toilet seat Toilet Transfers: 0-Activity did not occur  FIM - Banker Devices: Arm rests Bed/Chair Transfer: 5: Chair or W/C > Bed: Supervision  (verbal cues/safety issues);5: Bed > Chair or W/C: Supervision (verbal cues/safety issues)  FIM - Locomotion: Wheelchair Distance: 150 Locomotion: Wheelchair: 1: Total Assistance/staff pushes wheelchair (Pt<25%) FIM - Locomotion: Ambulation Locomotion: Ambulation Assistive Devices: Lite Gait Ambulation/Gait Assistance: 4: Min assist Locomotion: Ambulation: 4: Travels 150 ft or more with minimal assistance (Pt.>75%)  Comprehension Comprehension Mode:  Auditory Comprehension: 5-Follows basic conversation/direction: With extra time/assistive device  Expression Expression Mode: Verbal Expression: 5-Expresses complex 90% of the time/cues < 10% of the time  Social Interaction Social Interaction: 6-Interacts appropriately with others with medication or extra time (anti-anxiety, antidepressant).  Problem Solving Problem Solving: 5-Solves complex 90% of the time/cues < 10% of the time  Memory Memory: 6-More than reasonable amt of time  Medical Problem List and Plan:  1. Thrombotic left subcortical white matter infarct Right hemiplegia pure motor 2. DVT Prophylaxis/Anticoagulation: Subcutaneous heparin. Monitor platelet counts any signs of bleeding  3. Pain Management: Hydrocodone as needed. Monitor with increased mobility  4. Mood/anxiety. Ativan 0.5 mg every 6 hours as needed.  5. Neuropsych: This patient is capable of making decisions on his own behalf.  6. Hypertension. Norvasc 10 mg daily, reduce Coreg 6.25 mg twice a day. Monitor with increased mobility  7. History Right knee pain improved suspect chondromalacia rather than gout 8. Hyperlipidemia. Lipitor  9.  Anxiety rec SSRI +/- prn benzo (while i hospital) 10.  Bradycardia- likely coreg will reduce dose LOS (Days) 15 A FACE TO FACE EVALUATION WAS PERFORMED  Ayliana Casciano E 02/23/2013, 9:30 AM

## 2013-02-24 ENCOUNTER — Inpatient Hospital Stay (HOSPITAL_COMMUNITY): Payer: BC Managed Care – PPO

## 2013-02-24 ENCOUNTER — Inpatient Hospital Stay (HOSPITAL_COMMUNITY): Payer: BC Managed Care – PPO | Admitting: Occupational Therapy

## 2013-02-24 ENCOUNTER — Encounter (HOSPITAL_COMMUNITY): Payer: BC Managed Care – PPO | Admitting: Occupational Therapy

## 2013-02-24 DIAGNOSIS — M109 Gout, unspecified: Secondary | ICD-10-CM

## 2013-02-24 DIAGNOSIS — I10 Essential (primary) hypertension: Secondary | ICD-10-CM

## 2013-02-24 DIAGNOSIS — I6330 Cerebral infarction due to thrombosis of unspecified cerebral artery: Secondary | ICD-10-CM

## 2013-02-24 DIAGNOSIS — M25569 Pain in unspecified knee: Secondary | ICD-10-CM

## 2013-02-24 DIAGNOSIS — G8110 Spastic hemiplegia affecting unspecified side: Secondary | ICD-10-CM

## 2013-02-24 MED ORDER — LORAZEPAM 0.5 MG PO TABS
0.2500 mg | ORAL_TABLET | Freq: Four times a day (QID) | ORAL | Status: DC | PRN
  Administered 2013-02-24 – 2013-02-25 (×3): 0.25 mg via ORAL
  Filled 2013-02-24 (×3): qty 1

## 2013-02-24 NOTE — Progress Notes (Signed)
Occupational Therapy Session Note  Patient Details  Name: Bruce Guerrero MRN: [id number] Date of Birth: 1960-05-30  Today's Date: 02/24/2013 Time: 2536-6440 Time Calculation (min): 48 min  Short Term Goals: Week 2:  OT Short Term Goal 1 (Week 2): Pt will donn pullover shirt with supervision 2/3 sessions. OT Short Term Goal 2 (Week 2): Pt will perform all bathing sit to stand with min assist. OT Short Term Goal 3 (Week 2): Pt will perform toilet transfers stand pivot with min assist. OT Short Term Goal 4 (Week 2): Pt will perform LB dressing with min assist. OT Short Term Goal 5 (Week 2): Pt will use the RUE as a stabilizer with min assist during bathing tasks.  Skilled Therapeutic Interventions/Progress Updates:    Pt went down to the OT gym and worked on Best boy.  Also utilized NMES for the finger flexors and digit extensors for 25 mins during session as well.  Finger flexors set on 28 and extensors set on 27.  Had pt work on integrating shoulder flexion while stimulation was active to simulate functional reach.  Pt overall able to elicit some shoulder movements but needs mod facilitation at the trunk to avoid compensation strategies.  For the final 10 mins of stimulation had digit extensors activate only while having pt work on pushing up from the mat with the right hand in weightbearing.  Emphasis on not leading movement with the head and instead performing right rib tuck and elbow extension.    Therapy Documentation Precautions:  Precautions Precautions: Fall Precaution Comments: right hemiparesis Restrictions Weight Bearing Restrictions: No  Pain: Pain Assessment Pain Assessment: No/denies pain ADL: See FIM for current functional status  Therapy/Group: Individual Therapy  Michiah Mudry OTR/L 02/24/2013, 3:51 PM

## 2013-02-24 NOTE — Progress Notes (Signed)
52 y.o. right-handed male with history of hypertension. Patient was independent prior to admission working full time. Admitted 02/01/2013 with right-sided weakness and slurred speech. MRI of the brain showed a moderately enlarged deep white matter infarction on the left. MRA of the head with no large vessel occlusion identified. Carotid Dopplers with no ICA stenosis. Echocardiogram with ejection fraction of 60% grade 1 diastolic dysfunction. Patient did not receive TPA. Neurology services consulted presently maintained on Plavix for CVA prophylaxis with the addition of subcutaneous heparin for DVT prophylaxis. He is tolerating a regular diet  Subjective/Complaints: No new issues Discussed revise D/C date  Review of systems: Negative except for weakness on the right side  Objective: Vital Signs: Blood pressure 127/72, pulse 62, temperature 98.1 F (36.7 C), temperature source Oral, resp. rate 16, height 5\' 4"  (1.626 m), weight 78.699 kg (173 lb 8 oz), SpO2 97.00%. No results found. No results found for this or any previous visit (from the past 72 hour(s)).   HEENT: normal Cardio: RRR and no murmurs Resp: CTA B/L and unlabored GI: BS positive and non tender, nondistended Extremity:  Pulses positive and No Edema Skin:   Intact Neuro: Alert/Oriented, Flat, Cranial Nerve Abnormalities Right central 7, Normal Sensory, Abnormal Motor Trace right shoulder adduction, trace right hip extension, 3- R knee ext, 0/5 ankle and Dysarthric, 5/5 on Left side Musc/Skel:  Normal and Other No shoulder pain with range of motion, no knee effusion noted General no acute distress   Assessment/Plan: 1. Functional deficits secondary to Left subcortical infarct with severe right hemiplegia which require 3+ hours per day of interdisciplinary therapy in a comprehensive inpatient rehab setting. Physiatrist is providing close team supervision and 24 hour management of active medical problems listed  below. Physiatrist and rehab team continue to assess barriers to discharge/monitor patient progress toward functional and medical goals.  FIM: FIM - Bathing Bathing Steps Patient Completed: Chest;Right Arm;Abdomen;Front perineal area;Right upper leg;Buttocks;Left upper leg;Right lower leg (including foot);Left lower leg (including foot) Bathing: 4: Steadying assist  FIM - Upper Body Dressing/Undressing Upper body dressing/undressing steps patient completed: Thread/unthread left sleeve of pullover shirt/dress;Put head through opening of pull over shirt/dress;Pull shirt over trunk;Thread/unthread right sleeve of pullover shirt/dresss Upper body dressing/undressing: 5: Supervision: Safety issues/verbal cues FIM - Lower Body Dressing/Undressing Lower body dressing/undressing steps patient completed: Thread/unthread right underwear leg;Thread/unthread left underwear leg;Pull underwear up/down;Thread/unthread right pants leg;Thread/unthread left pants leg;Don/Doff right sock;Don/Doff left sock;Fasten/unfasten left shoe;Fasten/unfasten right shoe;Don/Doff left shoe Lower body dressing/undressing: 4: Min-Patient completed 75 plus % of tasks  FIM - Toileting Toileting steps completed by patient: Adjust clothing prior to toileting;Performs perineal hygiene;Adjust clothing after toileting Toileting Assistive Devices: Grab bar or rail for support Toileting: 6: More than reasonable amount of time  FIM - Diplomatic Services operational officer Devices: Grab bars;Elevated toilet seat Toilet Transfers: 0-Activity did not occur  FIM - Banker Devices: Arm rests;Walker Bed/Chair Transfer: 5: Chair or W/C > Bed: Supervision (verbal cues/safety issues);5: Bed > Chair or W/C: Supervision (verbal cues/safety issues)  FIM - Locomotion: Wheelchair Distance: 150 Locomotion: Wheelchair: 6: Travels 150 ft or more, turns around, maneuvers to table, bed or toilet,  negotiates 3% grade: maneuvers on rugs and over door sills independently FIM - Locomotion: Ambulation Locomotion: Ambulation Assistive Devices: Walker - Rolling;Orthosis Ambulation/Gait Assistance: 4: Min assist Locomotion: Ambulation: 4: Travels 150 ft or more with minimal assistance (Pt.>75%)  Comprehension Comprehension Mode: Auditory Comprehension: 5-Follows basic conversation/direction: With extra time/assistive device  Expression Expression  Mode: Verbal Expression: 5-Expresses complex 90% of the time/cues < 10% of the time  Social Interaction Social Interaction: 6-Interacts appropriately with others with medication or extra time (anti-anxiety, antidepressant).  Problem Solving Problem Solving: 5-Solves complex 90% of the time/cues < 10% of the time  Memory Memory: 6-More than reasonable amt of time  Medical Problem List and Plan:  1. Thrombotic left subcortical white matter infarct Right hemiplegia pure motor 2. DVT Prophylaxis/Anticoagulation: Subcutaneous heparin. Monitor platelet counts any signs of bleeding  3. Pain Management: Hydrocodone as needed. Monitor with increased mobility  4. Mood/anxiety. Reduce Ativan 0.25 mg every 6 hours as needed.  5. Neuropsych: This patient is capable of making decisions on his own behalf.  6. Hypertension. Norvasc 10 mg daily, reduce Coreg 6.25 mg twice a day. Monitor with increased mobility  7. History Right knee pain improved suspect chondromalacia rather than gout 8. Hyperlipidemia. Lipitor  9.  Anxiety rec SSRI +/- prn benzo (while i hospital) 10.  Bradycardia- likely coreg will reduce dose LOS (Days) 16 A FACE TO FACE EVALUATION WAS PERFORMED  Arelia Volpe E 02/24/2013, 8:43 AM

## 2013-02-24 NOTE — Progress Notes (Signed)
Physical Therapy Note  Patient Details  Name: Bruce Guerrero MRN: [id number] Date of Birth: 1960-12-17 Today's Date: 02/24/2013  Session 1 9:00 - 9:55 55 minutes Individual session Patient denies pain. Patient did complain of pain in knees during floor transfer.  Treatment focused on patient/family education with son and brother. Patient in wheelchair upon entering room. Patient propelled wheelchair using hemi technique 250 - 300 feet modified independently. Patient performed car transfer with supervision. Son, [name], instructed in and returned demonstration on folding and unfolding wheelchair to load in vehicle. Patient ambulated up and down 3 steps with mod HHA of therapist. Patient ambulated up and down steps with son, Brock's assistance and min assist for balance from therapist. PT recommends more practice with steps with son. Patient ambulated with rolling walker/orthosis/AFO 100 feet x 2 with son's min assist. Patient performed floor transfer with mod to max assist to get up from floor due to pain in knee with weight bearing in flexed position. Discussed with patient and family need to continue activity at home, skin checks for right LE due to AFO, safety with rugs, etc. Family and patient reported they had no more questions. Patient transferred to recliner with supervision and was left in recliner with all items in place and son and brother in room.   11:00 - 11:30  30 minutes Individual session Patient denies pain.  Treatment focused on practicing stairs and neuromuscular reeducation using Nustep. Patient ambulated up and down 5 steps with 1 rail and min assist. Patient ambulated with hand held assist - simulating home setting with no rail - up and down 3 with mod assist. Patient exercised on Nustep for 2 sets of 2.5 minutes working on right LE extension. Patient ambulated 100 feet with rolling walker/right hand orthosis/AFO and close supervision to occasional min steady assist.  Patient returned to room via wheelchair. Patient transferred to recliner with supervision. Patient left in recliner with all items in reach.  [name] 02/24/2013, 2:22 PM

## 2013-02-24 NOTE — Progress Notes (Signed)
Occupational Therapy Session Note  Patient Details  Name: Bruce Guerrero MRN: [id number] Date of Birth: [birth year]  Today's Date: 02/24/2013 Time: 0800-0900 Time Calculation (min): 60 min  Short Term Goals: Week 2:  OT Short Term Goal 1 (Week 2): Pt will donn pullover shirt with supervision 2/3 sessions. OT Short Term Goal 2 (Week 2): Pt will perform all bathing sit to stand with min assist. OT Short Term Goal 3 (Week 2): Pt will perform toilet transfers stand pivot with min assist. OT Short Term Goal 4 (Week 2): Pt will perform LB dressing with min assist. OT Short Term Goal 5 (Week 2): Pt will use the RUE as a stabilizer with min assist during bathing tasks.  Skilled Therapeutic Interventions/Progress Updates:    Pt's son and brother present for family education this session.  Pt transferred to the toilet with min assist to use the bathroom and then ambulated a few steps to the shower bench with min assist and use of the grab bar.  Performed shower sit to stand with min assist.  Pt continues to need max facilitation with the RUE in order to use functionally for bathing the left arm.  Min guard assist for sit to stand to wash peri area as well.  Pt performed dressing sit to stand at the sink with pt's son [name] assisting.  Pt overall supervision for all UB dressing and min assist for LB dressing.  Needs min guard assist for standing to pull up underpants and pants.  Still needs min assist for donning the right shoe with AFO.  When finished had son practice both stand pivot transfers to the toilet and squat pivot transfers.  Pt/son both feel more secure with squat pivot transfers.  Educated pt son and brother on proper positioning of the RUE in his lap as well as bringing the RUE up under him prior to standing.  Pt continues to need min questioning cues to follow this secondary to being impulsive.  Finished session by practicing tub transfer in the ADL apartment using tub bench.  Pt's son also  assisted with this as well.  Pt overall min assist for the task.  Therapy Documentation Precautions:  Precautions Precautions: Fall Precaution Comments: right hemiparesis Restrictions Weight Bearing Restrictions: No  Pain: Pain Assessment Pain Assessment: No/denies pain ADL: See FIM for current functional status  Therapy/Group: Individual Therapy  Rico Massar OTR/L 02/24/2013, 10:32 AM

## 2013-02-24 NOTE — Patient Care Conference (Signed)
Inpatient RehabilitationTeam Conference and Plan of Care Update Date: 02/23/2013   Time: 11:30 AM    Patient Name: Bruce Guerrero      Medical Record Number: [id number]  Date of Birth: Sep 30, 1960 Sex: Male         Room/Bed: 4W01C/4W01C-01 Payor Info: Payor: [employer]UE [employer] / Plan: BCBS PPO OUT OF STATE / Product Type: *No Product type* /    Admitting Diagnosis: CVA  Admit Date/Time:  02/08/2013  4:56 PM Admission Comments: No comment available   Primary Diagnosis:  <principal problem not specified> Principal Problem: <principal problem not specified>  Patient Active Problem List   Diagnosis Date Noted  . Knee pain, right anterior 02/15/2013  . CVA (cerebral infarction) 02/01/2013  . Gout 08/05/2012  . HTN (hypertension) 08/05/2012    Expected Discharge Date: Expected Discharge Date: 02/26/13  Team Members Present: Physician leading conference: Dr. Claudette Laws Social Worker Present: Dossie Der, LCSW;Jenny Shuree Brossart, LCSW Nurse Present: Other (comment) Ronny Bacon, RN) PT Present: Harriet Butte, PT OT Present: Rosalio Loud, Heath Lark, OT SLP Present: Fae Pippin, SLP PPS Coordinator present : Tora Duck, RN, CRRN;Becky Henrene Dodge, PT     Current Status/Progress Goal Weekly Team Focus  Medical   No shoulder pain, RLE strength mild increase  home with family assist  move up d/c if possible   Bowel/Bladder   continent of bowel and bladder  remain continent of bowel and bladder      Swallow/Nutrition/ Hydration             ADL's   Pt is currenlty min assist for UB and LB bathing, supervision for UB drressing and min assist LB dressing,  toileting and toilet transfers are min to mod assist.  Brunnstrum stage III in the right arm with increased flexor tone in the digits.  trace digit flexion noted at times but not consistent.  min assist for bathing, supervision dressing, min assist toileting and toilet transfers  neuromuscular re-education, selfcare  retraining, NMES, pt/family education   Mobility   Mod I bed mobility/wheelchair, supervision transfers, min A gait, mod A stairs  Min A gait/stairs, mod I wheelchair  gait, stairs, pt/family education   Communication             Safety/Cognition/ Behavioral Observations  follows safety plan w/ family at bedside at all times to assist w/ toileting,refuses bedalarm  continue to follw safety plan,safe mobility  continue to follow safety plan   Pain   knee pain,c/o muscle pain,voltaren scheduled and baclofen prn  <3  Assess and treat for pain q shift and prn   Skin   intact,abd has bruising form heparin SQ  skin remains intact  Assess pt skin q shift and prn    Rehab Goals Patient on target to meet rehab goals: Yes Rehab Goals Revised: None *See Care Plan and progress notes for long and short-term goals.  Barriers to Discharge: physical assist for ADL and Mob    Possible Resolutions to Barriers:  family ed    Discharge Planning/Teaching Needs:  Pt plans to return to his home with his sons and brother to stay with him. Pt's d/c was changed to 02-26-13.  Family has been involved in family education.   Team Discussion:  Team reports pt is doing well and he would be medically stable for d/c on 02-26-13 and therapists believe that pt will meet his goals by then.  Pt's son has done family education and will continue to do so.  OT is working on pt slowing down as he is doing his ADLs to make him more safe.  Revisions to Treatment Plan:  None   Continued Need for Acute Rehabilitation Level of Care: The patient requires daily medical management by a physician with specialized training in physical medicine and rehabilitation for the following conditions: Daily direction of a multidisciplinary physical rehabilitation program to ensure safe treatment while eliciting the highest outcome that is of practical value to the patient.: Yes Daily medical management of patient stability for increased  activity during participation in an intensive rehabilitation regime.: Yes Daily analysis of laboratory values and/or radiology reports with any subsequent need for medication adjustment of medical intervention for : Neurological problems;Other  Ante Arredondo, [place] Deck 02/24/2013, 10:22 AM

## 2013-02-24 NOTE — Progress Notes (Signed)
Social Work Patient ID: Bruce Guerrero, male   DOB: 24-Jun-1960, 52 y.o.   MRN: [id number]  CSW met with pt and talked with his son, [name] via telephone.  Told them that team agreed pt could be d/c'd on 02-26-13 and they were pleased.  Son is going through family education.  Pt will need outpatient PT and OT and some DME.  CSW will arrange this for the pt.

## 2013-02-25 ENCOUNTER — Ambulatory Visit: Payer: BC Managed Care – PPO | Admitting: Nurse Practitioner

## 2013-02-25 ENCOUNTER — Inpatient Hospital Stay (HOSPITAL_COMMUNITY): Payer: BC Managed Care – PPO | Admitting: Occupational Therapy

## 2013-02-25 ENCOUNTER — Encounter (HOSPITAL_COMMUNITY): Payer: BC Managed Care – PPO | Admitting: Occupational Therapy

## 2013-02-25 ENCOUNTER — Inpatient Hospital Stay (HOSPITAL_COMMUNITY): Payer: BC Managed Care – PPO | Admitting: Physical Therapy

## 2013-02-25 MED ORDER — CARVEDILOL 6.25 MG PO TABS: 6.2500 mg | ORAL_TABLET | Freq: Two times a day (BID) | ORAL | Status: DC

## 2013-02-25 MED ORDER — COLCHICINE 0.6 MG PO TABS: 0.6000 mg | ORAL_TABLET | Freq: Every day | ORAL | Status: DC

## 2013-02-25 MED ORDER — BACLOFEN 5 MG HALF TABLET: 5.0000 mg | ORAL_TABLET | Freq: Three times a day (TID) | ORAL | Status: DC | PRN

## 2013-02-25 MED ORDER — SERTRALINE HCL 50 MG PO TABS: 50.0000 mg | ORAL_TABLET | Freq: Every day | ORAL | Status: DC

## 2013-02-25 MED ORDER — AMLODIPINE BESYLATE 10 MG PO TABS: 10.0000 mg | ORAL_TABLET | Freq: Every day | ORAL | Status: DC

## 2013-02-25 MED ORDER — HYDROCODONE-ACETAMINOPHEN 5-325 MG PO TABS: 1.0000 | ORAL_TABLET | ORAL | Status: DC | PRN

## 2013-02-25 MED ORDER — CLOPIDOGREL BISULFATE 75 MG PO TABS: 75.0000 mg | ORAL_TABLET | Freq: Every day | ORAL | Status: DC

## 2013-02-25 MED ORDER — ATORVASTATIN CALCIUM 80 MG PO TABS: 80.0000 mg | ORAL_TABLET | Freq: Every day | ORAL | Status: DC

## 2013-02-25 MED ORDER — DICLOFENAC SODIUM 1 % TD GEL: 2.0000 g | Freq: Four times a day (QID) | TRANSDERMAL | Status: DC

## 2013-02-25 MED ORDER — LORAZEPAM 0.5 MG PO TABS: 0.2500 mg | ORAL_TABLET | Freq: Four times a day (QID) | ORAL | Status: DC | PRN

## 2013-02-25 NOTE — Progress Notes (Signed)
Physical Therapy Discharge Summary  Patient Details  Name: Bruce Guerrero MRN: [id number] Date of Birth: 08-13-1960  Today's Date: 02/25/2013 Time: 0800-0900 and 1430-1456 Time Calculation (min): 60 min and 26 min  Patient has met 11 of 11 long term goals due to improved activity tolerance, improved balance, improved postural control, increased strength, ability to compensate for deficits, functional use of  right lower extremity and improved coordination.  Patient to discharge at an ambulatory level Min Assist but Mod I w/c level.   Patient's care partner is independent to provide the necessary physical assistance at discharge.  Reasons goals not met: All goals met  Recommendation:  Patient will benefit from ongoing skilled PT services in outpatient setting to continue to advance safe functional mobility, address ongoing impairments in R sided hemiplegia, impaired motor control, timing, sequencing, impaired postural control, balance, gait, and minimize fall risk.  Equipment: RW and manual w/c adjusted to pt height; w/c received was not correct height and pt unable to perform w/c mobility with foot; Advanced notified of error and w/c adjusted to correct hemi height.  Reasons for discharge: treatment goals met and discharge from hospital  Patient/family agrees with progress made and goals achieved: Yes  PT Discharge Precautions/Restrictions Precautions Precautions: Fall Precaution Comments: right hemiparesis Restrictions Weight Bearing Restrictions: No Vital Signs Therapy Vitals Pulse Rate: 60 Pain Pain Assessment Pain Assessment: 0-10 Pain Score: 2  Pain Type: Acute pain Pain Location: Leg Pain Orientation: Anterior;Proximal Pain Descriptors / Indicators: Sore Pain Onset: Gradual (following floor transfer yesterday) Pain Intervention(s): Other (Comment) (pt did not request anything for pain)  Cognition Orientation Level: Oriented X4 Sensation Sensation Light Touch:  Appears Intact Stereognosis: Not tested Hot/Cold: Not tested Proprioception: Appears Intact Coordination Gross Motor Movements are Fluid and Coordinated: No Coordination and Movement Description: Impaired secondary to hemiplegia Motor  Motor Motor: Hemiplegia;Abnormal postural alignment and control;Motor impersistence Motor - Discharge Observations: R hemiplegia, impaired motor control and motor impersistence.  During dynamic movement pt compensates with forwards or L lateral leaning secondary to impaired postural control  Mobility Bed Mobility Bed Mobility: Supine to Sit;Sit to Supine Supine to Sit: 6: Modified independent (Device/Increase time) Sit to Supine: 6: Modified independent (Device/Increase time) Transfers Sit to Stand: 6: Modified independent (Device/Increase time) Stand to Sit: 6: Modified independent (Device/Increase time) Stand Pivot Transfers: 5: Supervision Stand Pivot Transfer Details (indicate cue type and reason): Supervision with RW secondary to continued impairments in weight shifting and R foot clearance Squat Pivot Transfers: 6: Modified independent (Device/Increase time) Locomotion  Ambulation Ambulation/Gait Assistance: 4: Min guard Ambulation Distance (Feet): 100 Feet Assistive device: Rolling walker;Other (Comment) (R hand orthosis on RW) Ambulation/Gait Assistance Details: Performed gait with RW and R AFO and R hand orthosis in controlled environment on tile floors; pt continues to present with intermittent R foot drag and forwards LOB.  Will discuss with P&O addition of leather toe cap to shoe.  Pt continues to require min A for balance and verbal and tactile cues for ful lateral weight shifting and trunk/pelvic rotation to initiate R swing phase.   Gait Gait Pattern: Impaired Gait Pattern: Step-to pattern;Decreased step length - left;Decreased stance time - right;Decreased stride length;Decreased hip/knee flexion - right;Decreased dorsiflexion -  right;Decreased weight shift to right;Lateral trunk lean to left;Trunk rotated posteriorly on right;Narrow base of support High Level Ambulation High Level Ambulation: Side stepping Side Stepping: to L and R with UE support on wall rails x 25' to L x 1; x 25' to R x  2 reps with second repetition with pt pushing against BOSU ball on floor for resistance and hip ABD strengthening.  Lateral stepping to focus on lateral weight shifting and full weight acceptance on RLE in stance Stairs / Additional Locomotion Stairs: Yes Stairs Assistance: 4: Min assist Stairs Assistance Details (indicate cue type and reason): Pt's son present for further stair training.  Son had picture of stairs to enter house.  Wrought Iron rail on L side within reach of pt.  Performed stair negotiation training with pt ascending forwards and descending backwards with LUE support on simulated Iron rail.  Demonstrated sequence first to pt and practiced with pt with son observing.  Son gave repeat demonstration providing adequate min A during negotiation.  Also discussed placement of RW during stair negotiation.   Stair Management Technique: One rail Left;Step to pattern;Forwards;Backwards Number of Stairs: 6 Height of Stairs: 4 Naval architect Assistance: 6: Modified independent (Device/Increase time) Wheelchair Propulsion: Left upper extremity;Left lower extremity Wheelchair Parts Management: Independent Distance: 300  Trunk/Postural Assessment  Cervical Assessment Cervical Assessment: Within Functional Limits Thoracic Assessment Thoracic Assessment: Within Functional Limits Lumbar Assessment Lumbar Assessment: Within Functional Limits Postural Control Trunk Control: During dynamic standing activities and mobility pt tends to compensate for proximal weakness with anterior or L lateral flexion/leaning.  Decreased R trunk shortening noted  Balance Static Sitting Balance Static Sitting - Balance Support: No  upper extremity supported;Feet supported Static Sitting - Level of Assistance: 6: Modified independent (Device/Increase time) Dynamic Sitting Balance Dynamic Sitting - Balance Support: No upper extremity supported Dynamic Sitting - Level of Assistance: 6: Modified independent (Device/Increase time) Static Standing Balance Static Standing - Balance Support: Right upper extremity supported;Left upper extremity supported;No upper extremity supported Static Standing - Level of Assistance: 5: Stand by assistance Dynamic Standing Balance Dynamic Standing - Balance Support: Right upper extremity supported;Left upper extremity supported Dynamic Standing - Level of Assistance: 4: Min assist Extremity Assessment  RLE Strength RLE Overall Strength: Deficits RLE Overall Strength Comments: 3/5 hip flexion/extension, knee flexion/extension, 1/5 hip ABD/ADD, 0/5 ankle DF LLE Assessment LLE Assessment: Within Functional Limits  PM session: Pt R shoe taken by Advanced P&O for addition of leather toe cap to prevent catching on floor.  Pt transferred recliner > w/c > bed and sit > supine at end of session mod I.  Unable to perform w/c mobility secondary to lack of shoe (changed to grip socks) and STF height on w/c still too tall.  Advanced DME to change w/c to hemi height.  Performed transfers w/c <> Nustep mod I squat pivot.  Performed NMR on Nustep at level 5 resistance x 4 min x 2 with LE only for focus on RLE extension activation and strengthening and general endurance.  One rest break between.  No further questions or concerns from pt.    See FIM for current functional status  Edman Circle [facility] 02/25/2013, 10:29 AM

## 2013-02-25 NOTE — Progress Notes (Signed)
Social Work Patient ID: Bruce Guerrero, male   DOB: 05-17-1960, 52 y.o.   MRN: [id number] Handicapped application given to pt and aware wheelchair to be switched out due to wrong one delivered. Ready for discharge tomorrow.

## 2013-02-25 NOTE — Progress Notes (Signed)
Occupational Therapy Session Note  Patient Details  Name: Bruce Guerrero MRN: [id number] Date of Birth: [birth year]  Today's Date: 02/25/2013 Time: 0800-0900 Time Calculation (min): 60 min  Short Term Goals: Week 2:  OT Short Term Goal 1 (Week 2): Pt will donn pullover shirt with supervision 2/3 sessions. OT Short Term Goal 2 (Week 2): Pt will perform all bathing sit to stand with min assist. OT Short Term Goal 3 (Week 2): Pt will perform toilet transfers stand pivot with min assist. OT Short Term Goal 4 (Week 2): Pt will perform LB dressing with min assist. OT Short Term Goal 5 (Week 2): Pt will use the RUE as a stabilizer with min assist during bathing tasks.  Skilled Therapeutic Interventions/Progress Updates:    Pt performed neuromuscular re-education on the RUE during session.  Began having pt work on weightbearing through the RUE while reaching with the LUE across his body to retrieve checker placed on the table.  Emphasis on pt maintaining active elbow extension in the RUE.  Pt needs mod assist to maintain elbow extension during the task.  Progressed to supine using dowel rod with right hand ace wrapped for gripping the dowel.  Performed bilateral AAROM shoulder flexion for 10-15 repetitions 3 sets.  Pt needing mod facilitation to perform shoulder flexion and extension.  Also worked on isolated elbow extension without the dowel rod on the RUE as well.  Therapist stabilized arm at 90 degrees shoulder flexion and had pt work on elbow extension.  Pt was able to perform 70% of extension but tends to overcompensate with his trunk when attempting the movement.  Issued pt a handout on self AAROM exercises to be performed at home and demonstrated them as well.  Pt attempted a couple of repetitions for each exercise also before returning to his room.    Therapy Documentation Precautions:  Precautions Precautions: Fall Precaution Comments: right hemiparesis Restrictions Weight Bearing  Restrictions: No  Pain: Pain Assessment Pain Assessment: No/denies pain Pain Score: 0-No pain  See FIM for current functional status  Therapy/Group: Individual Therapy  Marvetta Vohs OTR/L 02/25/2013, 10:12 AM

## 2013-02-25 NOTE — Plan of Care (Signed)
Problem: RH Functional Use of Upper Extremity Goal: LTG Patient will use RT/LT upper extremity as a (OT) LTG: Patient will use right/left upper extremity as a stabilizer/gross assist/diminished/nondominant/dominant level with assist, with/without cues during functional activity (OT)  Outcome: Not Met (add Reason) Needs mod assist to use as a stabilizer

## 2013-02-25 NOTE — Progress Notes (Addendum)
52 y.o. right-handed male with history of hypertension. Patient was independent prior to admission working full time. Admitted 02/01/2013 with right-sided weakness and slurred speech. MRI of the brain showed a moderately enlarged deep white matter infarction on the left. MRA of the head with no large vessel occlusion identified. Carotid Dopplers with no ICA stenosis. Echocardiogram with ejection fraction of 60% grade 1 diastolic dysfunction. Patient did not receive TPA. Neurology services consulted presently maintained on Plavix for CVA prophylaxis with the addition of subcutaneous heparin for DVT prophylaxis. He is tolerating a regular diet  Subjective/Complaints: No new issues Excited about D/C in am  Review of systems: Negative except for weakness on the right side  Objective: Vital Signs: Blood pressure 140/65, pulse 60, temperature 97.5 F (36.4 C), temperature source Oral, resp. rate 18, height 5\' 4"  (1.626 m), weight 78.699 kg (173 lb 8 oz), SpO2 98.00%. No results found. No results found for this or any previous visit (from the past 72 hour(s)).   HEENT: normal Cardio: RRR and no murmurs Resp: CTA B/L and unlabored GI: BS positive and non tender, nondistended Extremity:  Pulses positive and No Edema Skin:   Intact Neuro: Alert/Oriented, Flat, Cranial Nerve Abnormalities Right central 7, Normal Sensory, Abnormal Motor Trace right shoulder adduction, trace right hip extension, 3- R knee ext, 0/5 ankle and Dysarthric, 5/5 on Left side Musc/Skel:  Normal and Other No shoulder pain with range of motion, no knee effusion noted General no acute distress   Assessment/Plan: 1. Functional deficits secondary to Left subcortical infarct with severe right hemiplegia which require 3+ hours per day of interdisciplinary therapy in a comprehensive inpatient rehab setting. Physiatrist is providing close team supervision and 24 hour management of active medical problems listed below. Physiatrist  and rehab team continue to assess barriers to discharge/monitor patient progress toward functional and medical goals. Anticipate D/C home in am FIM: FIM - Bathing Bathing Steps Patient Completed: Chest;Right Arm;Abdomen;Front perineal area;Right upper leg;Buttocks;Left upper leg;Right lower leg (including foot);Left lower leg (including foot) Bathing: 4: Steadying assist  FIM - Upper Body Dressing/Undressing Upper body dressing/undressing steps patient completed: Thread/unthread left sleeve of pullover shirt/dress;Put head through opening of pull over shirt/dress;Pull shirt over trunk;Thread/unthread right sleeve of pullover shirt/dresss Upper body dressing/undressing: 5: Supervision: Safety issues/verbal cues FIM - Lower Body Dressing/Undressing Lower body dressing/undressing steps patient completed: Thread/unthread right underwear leg;Thread/unthread left underwear leg;Pull underwear up/down;Thread/unthread right pants leg;Thread/unthread left pants leg;Don/Doff right sock;Don/Doff left sock;Fasten/unfasten left shoe;Fasten/unfasten right shoe;Don/Doff left shoe;Pull pants up/down Lower body dressing/undressing: 4: Min-Patient completed 75 plus % of tasks  FIM - Toileting Toileting steps completed by patient: Adjust clothing prior to toileting;Performs perineal hygiene;Adjust clothing after toileting Toileting Assistive Devices: Grab bar or rail for support Toileting: 6: More than reasonable amount of time  FIM - Diplomatic Services operational officer Devices: Grab bars Toilet Transfers: 4-From toilet/BSC: Min A (steadying Pt. > 75%);4-To toilet/BSC: Min A (steadying Pt. > 75%)  FIM - Bed/Chair Transfer Bed/Chair Transfer Assistive Devices: Arm rests;Walker Bed/Chair Transfer: 5: Supine > Sit: Supervision (verbal cues/safety issues);5: Sit > Supine: Supervision (verbal cues/safety issues);3: Bed > Chair or W/C: Mod A (lift or lower assist);3: Chair or W/C > Bed: Mod A (lift or lower  assist)  FIM - Locomotion: Wheelchair Distance: 300 Locomotion: Wheelchair: 6: Travels 150 ft or more, turns around, maneuvers to table, bed or toilet, negotiates 3% grade: maneuvers on rugs and over door sills independently FIM - Locomotion: Ambulation Locomotion: Ambulation Assistive Devices: Environmental consultant -  Rolling (hand orthosis) Ambulation/Gait Assistance: 4: Min guard Locomotion: Ambulation: 2: Travels 50 - 149 ft with minimal assistance (Pt.>75%)  Comprehension Comprehension Mode: Auditory Comprehension: 5-Follows basic conversation/direction: With extra time/assistive device  Expression Expression Mode: Verbal Expression: 5-Expresses complex 90% of the time/cues < 10% of the time  Social Interaction Social Interaction: 6-Interacts appropriately with others with medication or extra time (anti-anxiety, antidepressant).  Problem Solving Problem Solving: 5-Solves basic 90% of the time/requires cueing < 10% of the time  Memory Memory: 6-More than reasonable amt of time  Medical Problem List and Plan:  1. Thrombotic left subcortical white matter infarct Right hemiplegia pure motor 2. DVT Prophylaxis/Anticoagulation: Subcutaneous heparin. Monitor platelet counts any signs of bleeding  3. Pain Management: Hydrocodone as needed. Monitor with increased mobility  4. Mood/anxiety. Reduce Ativan 0.25 mg every 6 hours as needed.  5. Neuropsych: This patient is capable of making decisions on his own behalf.  6. Hypertension. Norvasc 10 mg daily, reduce Coreg 6.25 mg twice a day. Monitor with increased mobility  7. History Right knee pain improved suspect chondromalacia rather than gout 8. Hyperlipidemia. Lipitor  9.  Anxiety rec SSRI D/C benzo  10.  Bradycardia-asymptomatic related to coreg  LOS (Days) 17 A FACE TO FACE EVALUATION WAS PERFORMED  Kayleanna Lorman E 02/25/2013, 8:24 AM

## 2013-02-25 NOTE — Progress Notes (Signed)
Occupational Therapy Discharge Summary  Patient Details  Name: Bruce Guerrero MRN: [id number] Date of Birth: November 10, 1960  Today's Date: 02/25/2013 Time: 4098-1191 Time Calculation (min): 26 min  Session Note: Pt worked on [employer] functional movement during session.  Applied NMES to right triceps to increase active movement.  Settings on 50 PPS, 300 pulse width, and intensity set at level 32.  Had pt lay in supine position and work on active elbow extension with shoulder flexed at 90 degrees and therapist stabilizing it.  NMES lasted for 20 mins during session.  Also worked on placing right hand on flat board and having pt maintain constant active contact as therapist moved the board around in different planes including slight shoulder flexion.  Pt needed mod facilitation at the elbow to maintain contact with the board during shoulder flexion.    Patient has met 9 of 10 long term goals due to improved activity tolerance, improved balance, postural control, ability to compensate for deficits and functional use of  RIGHT upper extremity.  Patient to discharge at [facility] Assist level.  Patient's care partner is independent to provide the necessary physical assistance at discharge.    Reasons goals not met: Pt needs mod facilitation to use the RUE as a stabilizer for bathing and grooming tasks.  Recommendation:  Patient will benefit from ongoing skilled OT services in home health setting to continue to advance functional skills in the area of BADL and iADL.  Mr. Ofarrell has made great progress with therapy this stay.  He is currently Brunnstrom stage III in the right arm and stage II in the hand.  He continues to demonstrate weakness in the RLE as well and decreased balance related to selfcare tasks.  At times he is slightly impulsive and needs cueing to slow down for positioning of his RUE and RLE prior to functional transfers.  In addition, he continues to compensate a lot with overuse of his trunk  during function as well.  Recommend continued OT to help increase overall independence to a modified independent level.    Equipment: tub bench, 3:1  Reasons for discharge: treatment goals met and discharge from hospital  Patient/family agrees with progress made and goals achieved: Yes  OT Discharge Precautions/Restrictions  Precautions Precautions: Fall Precaution Comments: right hemiparesis  Pain Pain Assessment Pain Assessment: No/denies pain ADL  See FIM scale  Vision/Perception  Vision - History Baseline Vision: No visual deficits Patient Visual Report: No change from baseline Vision - Assessment Eye Alignment: Within Functional Limits Vision Assessment: Vision tested Ocular Range of Motion: Within Functional Limits Tracking/Visual Pursuits: Able to track stimulus in all quads without difficulty Saccades: Within functional limits Visual Fields: No apparent deficits Perception Perception: Within Functional Limits Praxis Praxis: Intact  Cognition Overall Cognitive Status: Within Functional Limits for tasks assessed Arousal/Alertness: Awake/alert Orientation Level: Oriented X4 Attention: Sustained Sustained Attention: Appears intact Memory: Appears intact Awareness: Appears intact Behaviors: Impulsive Safety/Judgment: Impaired Comments: Pt needs cueing to slow down, position his RLE and RUE at times.   Sensation Sensation Light Touch: Appears Intact Stereognosis: Not tested Hot/Cold: Not tested Proprioception: Appears Intact Coordination Gross Motor Movements are Fluid and Coordinated: No Fine Motor Movements are Fluid and Coordinated: No Coordination and Movement Description: Pt is currently Brunnstrum stage III in the right arm and stage II in the hand.   Motor  Motor Motor - Discharge Observations: R hemiplegia, impaired motor control and motor impersistence.  During dynamic movement pt compensates with forwards or L lateral  leaning secondary to  impaired postural control Mobility  Bed Mobility Bed Mobility: Supine to Sit;Sit to Supine Supine to Sit: 6: Modified independent (Device/Increase time) Sit to Supine: 6: Modified independent (Device/Increase time) Transfers Transfers: Sit to Stand;Stand to Sit Sit to Stand: 6: Modified independent (Device/Increase time) Sit to Stand Details: Manual facilitation for weight shifting;Verbal cues for sequencing;Manual facilitation for weight bearing Stand to Sit: 6: Modified independent (Device/Increase time) Stand to Sit Details (indicate cue type and reason): Verbal cues for sequencing;Manual facilitation for weight shifting;Manual facilitation for weight bearing  Trunk/Postural Assessment  Cervical Assessment Cervical Assessment: Within Functional Limits Thoracic Assessment Thoracic Assessment: Within Functional Limits Lumbar Assessment Lumbar Assessment: Within Functional Limits Postural Control Trunk Control: Pt demonstrates increased right trunk elongation and left trunk shortening.  He tends to over compensate with his trunk with attempted movement of the RUE  as well as when returning to midline from dynamic activities when reaching on the right side.  Balance Static Sitting Balance Static Sitting - Balance Support: No upper extremity supported;Feet supported Static Sitting - Level of Assistance: 6: Modified independent (Device/Increase time) Dynamic Sitting Balance Dynamic Sitting - Balance Support: No upper extremity supported Dynamic Sitting - Level of Assistance: 6: Modified independent (Device/Increase time) Static Standing Balance Static Standing - Balance Support: Right upper extremity supported;Left upper extremity supported;No upper extremity supported Static Standing - Level of Assistance: 5: Stand by assistance Dynamic Standing Balance Dynamic Standing - Balance Support: Right upper extremity supported;Left upper extremity supported Dynamic Standing - Level of  Assistance: 4: Min assist Dynamic Standing - Balance Activities: Reaching for objects;Reaching across midline Extremity/Trunk Assessment RUE Assessment RUE Assessment: Exceptions to [facility] RUE Strength RUE Overall Strength Comments: Pt with Brunnstrum stage III in the right arm and stage II in the hand.  PROM WFLs for all joints.  He demonstrates increased flexor synergy with attempted movement.  Slight increased flexion tone noted in the biceps and finger flexors.   LUE Assessment LUE Assessment: Within Functional Limits  See FIM for current functional status  Aldona Bryner OTR/L 02/25/2013, 4:08 PM

## 2013-02-25 NOTE — Discharge Summary (Signed)
Discharge summary job [phone]

## 2013-02-25 NOTE — Progress Notes (Signed)
Social Work Discharge Note Discharge Note  The overall goal for the admission was met for:   Discharge location: Yes-HOME WITH BROTHER ASSISTING WITH HIS CARE & SON  Length of Stay: Yes-18 DAYS  Discharge activity level: Yes-SUPERVISION/MIN LEVEL  Home/community participation: Yes  Services provided included: MD, RD, PT, OT, SLP, RN, TR, Pharmacy, Neuropsych and SW  Financial Services: Private Insurance: [employer] [place]  Follow-up services arranged: Outpatient: [facility] 12/30 1;45-3;15 and DME: [facility]-ROLLING WALKER, BSC, TUB BENCH, WHEELCHAIR  Comments (or additional information):FAMILY EDUCATION COMPLETED AND BOTH READY FOR DISCHARGE  Patient/Family verbalized understanding of follow-up arrangements: Yes  Individual responsible for coordination of the follow-up plan: SELF & TIM-BROTHER  Confirmed correct DME delivered: Lucy Chris 02/25/2013    Lucy Chris

## 2013-02-26 NOTE — Discharge Summary (Signed)
NAME:  Bruce Guerrero, Bruce Guerrero NO.:  [id number]  MEDICAL RECORD NO.:  [id number]  LOCATION:  [id number]                        FACILITY:  [facility]  PHYSICIAN:  Erick Colace, M.D.DATE OF BIRTH:  08/12/1960  DATE OF ADMISSION:  02/08/2013 DATE OF DISCHARGE:  02/26/2013                              DISCHARGE SUMMARY   DISCHARGE DIAGNOSES: 1. Thrombotic left subcortical white matter infarction. 2. Subcutaneous heparin for deep venous thrombosis prophylaxis. 3. Pain management. 4. Anxiety. 5. Hypertension. 6. History of gout. 7. Hyperlipidemia.  A 52 year old right-handed male with history of hypertension, independent prior to admission, working full-time.  Admitted on February 01, 2013, with right-sided weakness and slurred speech.  MRI of the brain showed a moderately enlarged deep white matter infarction on the left.  MRA of the head with no large vessel occlusion identified. Carotid Dopplers with no ICA stenosis.  Echocardiogram with ejection fraction of 60% and grade 1 diastolic dysfunction.  The patient did not receive tPA.  Neurology Service was consulted, maintained on Plavix, aspirin therapy, and later just Plavix alone.  He was tolerating a regular diet.  Physical and occupational therapy completed.  The patient was admitted for comprehensive rehab program.  PAST MEDICAL HISTORY:  See discharge diagnoses.  SOCIAL HISTORY:  Lives with family.  FUNCTIONAL HISTORY:  Prior to admission, independent.  FUNCTIONAL STATUS:  Upon admission to Rehab Services, +2 total assist to ambulate 8 feet with a hemi-walker.  PHYSICAL EXAMINATION:  VITAL SIGNS:  Blood pressure 138/87, pulse 60, temperature 98.3, respirations 20. GENERAL:  This was an alert male, speech mildly dysarthric, but intelligible.  He followed simple commands. HEENT:  Pupils were round and reactive to light. LUNGS:  Clear to auscultation. CARDIAC:  Regular rate and rhythm. ABDOMEN:  Soft, nontender.   Good bowel sounds.  REHABILITATION HOSPITAL COURSE:  The patient was admitted to Inpatient Rehab Services with therapies initiated on a 3-hour daily basis consisting of physical therapy, occupational therapy, and rehabilitation nursing.  The following issues were addressed during the patient's rehabilitation stay.  Pertaining to [name] thrombotic left subcortical white matter infarction remained stable, he would follow up with Neurology Services, maintained on Plavix therapy.  He was tolerating a regular consistency diet.  Blood pressures controlled on Norvasc as well as Coreg, and he would follow up with his primary MD. He did have a history of gout.  He continued on colchicine with no flare- ups noted other than some intermittent right knee pain.  Hyperlipidemia with Lipitor as advised.  X-rays were completed of his right knee, they were unremarkable, no significant abnormalities identified.  The patient received weekly collaborative interdisciplinary team conferences to discuss estimated length of stay, family teaching, and any barriers to discharge.  He was ambulating with a rolling walker with an AFO brace, 100 feet x2 with son, assisting in minimal assistance.  Propel his wheelchair independently.  Perform car transfers with supervision. Ambulating up and down 3 steps with moderate handheld-assist of therapist.  Transfer to the toilet with min-assist.  He used the bathroom and then ambulated a few steps to the shower bench with minimal assist using a grab bar.  Full family teaching was completed and plan was  to be discharged to home with ongoing therapies dictated per [facility].  DISCHARGE MEDICATIONS: 1. Norvasc 10 mg p.o. daily. 2. Lipitor 80 mg p.o. daily. 3. Baclofen 5 mg p.o. every 8 hours as needed for cramping. 4. Coreg 6.25 mg p.o. b.i.d. 5. Plavix 75 mg p.o. daily. 6. Colchicine 0.6 mg p.o. daily. 7. Voltaren gel, apply 4 times daily to affected area. 8.  Hydrocodone 1-2 tablets every 4 hours as needed for moderate pain,     dispense of 60 tablets. 9. Ativan 0.25 mg p.o. every 6 hours as needed for anxiety. 10.Zoloft 50 mg p.o. daily.  The patient had been on subcutaneous heparin throughout his rehab course for DVT prophylaxis, discontinue at time of discharge.  His diet was regular.  SPECIAL INSTRUCTIONS:  Follow up with Dr. Claudette Laws at the [facility] on April 01, 2013; Dr. Delia Heady, Neurology Services, 1 month call for appointment; Dr. Rudi Heap, medical management, on March 07, 2013.  The patient was advised no driving.     Mariam Dollar, P.A.   ______________________________ Erick Colace, M.D.    DA/MEDQ  D:  02/25/2013  T:  02/26/2013  Job:  [id number]  cc:   Ernestina Penna, M.D. Pramod P. Pearlean Brownie, MD

## 2013-02-26 NOTE — Progress Notes (Signed)
Patient discharge to home today, family assisting at bedside. No questions regarding discharge. Wheeled via wheelchair by NT.

## 2013-02-26 NOTE — Progress Notes (Signed)
  52 y.o. right-handed male with history of hypertension. Patient was independent prior to admission working full time. Admitted 02/01/2013 with right-sided weakness and slurred speech. MRI of the brain showed a moderately enlarged deep white matter infarction on the left. MRA of the head with no large vessel occlusion identified. Carotid Dopplers with no ICA stenosis. Echocardiogram with ejection fraction of 60% grade 1 diastolic dysfunction. Patient did not receive TPA. Neurology services consulted presently maintained on Plavix for CVA prophylaxis with the addition of subcutaneous heparin for DVT prophylaxis. He is tolerating a regular diet  Subjective/Complaints: Feels well eager to go home.  Objective: Vital Signs: Blood pressure 138/65, pulse 64, temperature 97.3 F (36.3 C), temperature source Oral, resp. rate 18, height 5\' 4"  (1.626 m), weight 173 lb 8 oz (78.699 kg), SpO2 97.00%.  No acute distress. Chest clear to auscultation. Cardiac exam S1-S2 are regular. Abdominal exam active bowel sounds, soft.  Assessment/Plan: 1. Functional deficits secondary to Left subcortical infarct with severe right hemiplegia  Medical Problem List and Plan:  1. Thrombotic left subcortical white matter infarct Right hemiplegia pure motor 2. DVT Prophylaxis/Anticoagulation: Subcutaneous heparin. Monitor platelet counts any signs of bleeding  3. Pain Management: Hydrocodone as needed.  4. Mood/anxiety. Ativan. 5. Neuropsych: This patient is capable of making decisions on his own behalf.  6. Hypertension. Norvasc 10 mg daily, reduce Coreg 6.25 mg twice a day.  7. History Right knee pain improved suspect chondromalacia rather than gout 8. Hyperlipidemia. Lipitor  9.  Anxiety rec SSRI D/C benzo  10.  Bradycardia-asymptomatic related to coreg  LOS (Days) 18 A FACE TO FACE EVALUATION WAS PERFORMED  Latausha Flamm HENRY 02/26/2013, 8:27 AM

## 2013-03-01 ENCOUNTER — Inpatient Hospital Stay (HOSPITAL_COMMUNITY): Payer: BC Managed Care – PPO | Admitting: Physical Therapy

## 2013-03-07 ENCOUNTER — Encounter: Payer: Self-pay | Admitting: General Practice

## 2013-03-07 ENCOUNTER — Ambulatory Visit (INDEPENDENT_AMBULATORY_CARE_PROVIDER_SITE_OTHER): Payer: BC Managed Care – PPO | Admitting: General Practice

## 2013-03-07 VITALS — BP 150/72 | HR 70 | Temp 97.1°F | Ht 64.0 in | Wt 172.5 lb

## 2013-03-07 DIAGNOSIS — I6789 Other cerebrovascular disease: Secondary | ICD-10-CM

## 2013-03-07 DIAGNOSIS — Z09 Encounter for follow-up examination after completed treatment for conditions other than malignant neoplasm: Secondary | ICD-10-CM

## 2013-03-07 DIAGNOSIS — I10 Essential (primary) hypertension: Secondary | ICD-10-CM

## 2013-03-07 NOTE — Patient Instructions (Signed)

## 2013-03-08 ENCOUNTER — Ambulatory Visit (HOSPITAL_COMMUNITY)
Admit: 2013-03-08 | Discharge: 2013-03-08 | Disposition: A | Discharge status: Home / Self Care | Payer: BC Managed Care – PPO | Source: Ambulatory Visit | Attending: Physical Medicine & Rehabilitation | Admitting: Physical Medicine & Rehabilitation

## 2013-03-08 DIAGNOSIS — R262 Difficulty in walking, not elsewhere classified: Secondary | ICD-10-CM | POA: Insufficient documentation

## 2013-03-08 DIAGNOSIS — R2689 Other abnormalities of gait and mobility: Secondary | ICD-10-CM

## 2013-03-08 DIAGNOSIS — IMO0001 Reserved for inherently not codable concepts without codable children: Secondary | ICD-10-CM | POA: Insufficient documentation

## 2013-03-08 DIAGNOSIS — R269 Unspecified abnormalities of gait and mobility: Secondary | ICD-10-CM | POA: Insufficient documentation

## 2013-03-08 DIAGNOSIS — R29898 Other symptoms and signs involving the musculoskeletal system: Secondary | ICD-10-CM

## 2013-03-08 DIAGNOSIS — R279 Unspecified lack of coordination: Secondary | ICD-10-CM | POA: Insufficient documentation

## 2013-03-08 DIAGNOSIS — I69998 Other sequelae following unspecified cerebrovascular disease: Secondary | ICD-10-CM | POA: Insufficient documentation

## 2013-03-08 DIAGNOSIS — M6281 Muscle weakness (generalized): Secondary | ICD-10-CM | POA: Insufficient documentation

## 2013-03-08 LAB — CMP14+EGFR
ALT: 40 IU/L (ref 0–44)
AST: 22 IU/L (ref 0–40)
Albumin/Globulin Ratio: 2.8 — ABNORMAL HIGH (ref 1.1–2.5)
BUN: 14 mg/dL (ref 6–24)
CO2: 23 mmol/L (ref 18–29)
Calcium: 9.6 mg/dL (ref 8.7–10.2)
Chloride: 103 mmol/L (ref 97–108)
Creatinine, Ser: 1.28 mg/dL — ABNORMAL HIGH (ref 0.76–1.27)
GFR calc Af Amer: 74 mL/min/{1.73_m2} (ref >59)
GFR calc non Af Amer: 64 mL/min/{1.73_m2} (ref >59)
Globulin, Total: 1.7 g/dL (ref 1.5–4.5)
Glucose: 84 mg/dL (ref 65–99)
Sodium: 143 mmol/L (ref 134–144)
Total Protein: 6.4 g/dL (ref 6.0–8.5)

## 2013-03-08 LAB — CBC WITH DIFFERENTIAL
Basos: 0 %
Eos: 2 %
HCT: 45.4 % (ref 37.5–51.0)
Hemoglobin: 15.4 g/dL (ref 12.6–17.7)
Immature Grans (Abs): 0 10*3/uL (ref 0.0–0.1)
Immature Granulocytes: 0 %
Lymphs: 33 %
MCH: 31.8 pg (ref 26.6–33.0)
Monocytes: 8 %
Neutrophils Relative %: 57 %
Platelets: 195 10*3/uL (ref 150–379)
RBC: 4.85 x10E6/uL (ref 4.14–5.80)

## 2013-03-08 NOTE — Evaluation (Signed)
Occupational Therapy Evaluation  Patient Details  Name: Bruce Guerrero MRN: [id number] Date of Birth: 1960-07-27  Today's Date: 03/08/2013 Time: 1357-1430 OT Time Calculation (min): 33 min OT Eval 1357-1430 (33')  Visit#: 1 of 36  Re-eval: 04/05/13  Assessment Diagnosis: CVA - dominant side Next MD Visit: January 23rd Prior Therapy: CIR for 3 weeks - [facility]  Authorization: BCBS   Past Medical History:  Past Medical History  Diagnosis Date  . Gout   . Hypertension   . Stroke    Past Surgical History: No past surgical history on file.  Subjective Symptoms/Limitations Symptoms: "I'm doing pretty good - I spent 3 weeks at rehab" Pertinent History: Pt is 52 yo right handed male, who had a stroke on November 25th. Pt has right side deficits in both arm and leg. Patient Stated Goals: 'to regain using this hand' Pain Assessment Currently in Pain?: No/denies  Precautions/Restrictions  Precautions Precautions: Fall Required Braces or Orthoses: Other Brace/Splint (AFO - right) Restrictions Weight Bearing Restrictions: No  Balance Screening Balance Screen Has the patient fallen in the past 6 months: No Has the patient had a decrease in activity level because of a fear of falling? : No Is the patient reluctant to leave their home because of a fear of falling? : No  Prior Functioning  Home Living Family/patient expects to be discharged to:: Private residence Living Arrangements: Children;Other (Comment) (Son) Available Help at Discharge: Family Type of Home: House Home Access: Stairs to enter Secretary/administrator of Steps: 3 Entrance Stairs-Rails: None Home Layout: One level Home Equipment: Environmental consultant - 2 wheels;Shower seat;Bedside commode;Wheelchair - manual (RW with arm trough)  Lives With: Son;Family Prior Function Level of Independence: Independent with basic ADLs;Independent with homemaking with ambulation;Independent with gait  Able to Take Stairs?:  Yes Driving: Yes Vocation: Full time employment Vocation Requirements: Production designer, theatre/television/film at [employer] Leisure: Hobbies-no  Assessment ADL/Vision/Perception ADL ADL Comments: Pt using L for feeding and grooming. Pt dresses indpendently, with extra time for lower extremity. Pt uses shoe buttons, and requires assist for donning/doffing AFO w shoe. Pt independent w toileting and bathing while seated. Pt reports general difficulty in using right hand - cutting food or bilateral tasks. Dominant Hand: Right Vision - History Baseline Vision: No visual deficits (Has glasses for driving) Patient Visual Report: No change from baseline  Cognition/Observation Cognition Overall Cognitive Status: Within Functional Limits for tasks assessed Arousal/Alertness: Awake/alert Orientation Level: Oriented X4 Safety/Judgment: Impaired  Sensation/Coordination/Edema Sensation Light Touch: Appears Intact Stereognosis: Not tested Hot/Cold: Not tested Proprioception: Appears Intact Coordination Gross Motor Movements are Fluid and Coordinated: No Fine Motor Movements are Fluid and Coordinated: No Edema Edema: none  Additional Assessments RUE Assessment RUE Assessment: Exceptions to [facility] RUE Strength RUE Overall Strength Comments: Pt has elevation (quarter range), elbow flexion (half range), pronation, no supination, no digit extenion, some digit flexion (with some flexor tone), trace wrist extension, and no wrist flexion,      Occupational Therapy Assessment and Plan OT Assessment and Plan Clinical Impression Statement: A: Pt presents w RUe weakenss s/p recent CVA. Pt received inpatient rehab at [facility] and was recently d/c. Pt will benefit from skilled OT interventions to improve RUE AROM, strength, gross and fine motor coordination, and use of RUE as dominant w B/IADLs. Pt will benefit from skilled therapeutic intervention in order to improve on the following deficits: Impaired UE functional use;Decreased  range of motion;Decreased coordination;Decreased strength;Impaired tone Rehab Potential: Good OT Frequency: Min 3X/week OT Duration: 12 weeks OT  Treatment/Interventions: Self-care/ADL training;Therapeutic activities;Therapeutic exercise;Neuromuscular education;Patient/family education;Manual therapy;Modalities;Splinting OT Plan: P: Pt will benefit from skilled OT intervention to improve RUE AROM, strength, and gross/fine motor coordination.   Goals Home Exercise Program Pt/caregiver will Perform Home Exercise Program: For increased strengthening PT Goal: Perform Home Exercise Program - Progress: Goal set today Short Term Goals Time to Complete Short Term Goals: 6 weeks Short Term Goal 1: Pt will be educated on HEP. Short Term Goal 2: Pt will be educated on use and wear of hand splint to decrease flexor tone. Short Term Goal 3: Pt will increase use of RUE to engage in AAROM in daily activities, 25% of the time. Short Term Goal 4: Pt will increase AROM in right shoulder to 25 degress of flexion to improve use of RUE in ADLs. Short Term Goal 5: Pt will don RLE AFO and shoe with modified independence Additional Short Term Goals?: Yes Short Term Goal 6: Pt will tolerate weightbearing on RUE for 1 minute to improve use of RUE in ADLs Short Term Goal 7: Pt will tolerate wear of hand splint for 6 hours per day, with no signs/symptoms of skin breakdown Long Term Goals Time to Complete Long Term Goals: 12 weeks Long Term Goal 1: Pt will increase use of RUE to engage in AROM in daily activities, 50% of the time. Long Term Goal 2: Pt will increase AROM in right shoulder to 45 degress of flexion to improve use of RUE in ADLs. Long Term Goal 3: Pt will tolerate weightbearing on RUE for 5 minutes to improve use of RUE in ADLs Long Term Goal 4: Pt will attain 3+/5 strength in RUE to engage in daily acticities w RUE. Long Term Goal 5: Pt will don/doff hand splint w modified independence.  Problem  List Patient Active Problem List   Diagnosis Date Noted  . Difficulty in walking(719.7) 03/08/2013  . Right leg weakness 03/08/2013  . Balance problems 03/08/2013  . Weakness of right upper extremity 03/08/2013  . Knee pain, right anterior 02/15/2013  . CVA (cerebral infarction) 02/01/2013  . Gout 08/05/2012  . HTN (hypertension) 08/05/2012    End of Session Activity Tolerance: Patient tolerated treatment well General Behavior During Therapy: [facility] for tasks assessed/performed OT Plan of Care OT Home Exercise Plan: towel slides for SROM OT Patient Instructions: Already Performing from CIR Consulted and Agree with Plan of Care: Patient  GO    [name], OTR/L Marry Guan, OTR/L 03/08/2013, 4:49 PM  Physician Documentation Your signature is required to indicate approval of the treatment plan as stated above.  Please sign and either send electronically or make a copy of this report for your files and return this physician signed original.  Please mark one 1.__approve of plan  2. ___approve of plan with the following conditions.   ______________________________                                                          _____________________ Physician Signature  Date  

## 2013-03-08 NOTE — Evaluation (Signed)
Physical Therapy Evaluation  Patient Details  Name: Bruce Guerrero MRN: [id number] Date of Birth: 07-05-1960  Today's Date: 03/08/2013 Time: 9629-5284 PT Time Calculation (min): 37 min evaluation             Visit#: 1 of 18  Re-eval: 04/07/13 Assessment Diagnosis: CVA  Next MD Visit: 04/01/2013 Prior Therapy: Inpt rehab.  Authorization: BCBS     Past Medical History:  Past Medical History  Diagnosis Date  . Gout   . Hypertension   . Stroke    Past Surgical History: No past surgical history on file.  Subjective Symptoms/Limitations Symptoms: Mr. Fines states that he is using his walker to walk with he is walking by himself in the house.  His main concern i s the use of his right hand. Pertinent History: independent prior to admission, working full-time who sustained a L CVA on 02/01/2013.  He was admitted to [facility] and then admitted to the IP rehab floor.  He was discharge on12/20/2014 and is now referred to OP PT to maximize his functional ability How long can you sit comfortably?: no problem How long can you stand comfortably?: Able to stand for 10 minutes How long can you walk comfortably?: Able to walk with the walker for 10 minutes. Pain Assessment Currently in Pain?: No/denies  Precautions/Restrictions  Precautions Precautions: Fall Required Braces or Orthoses: Other Brace/Splint (AFO - right) Restrictions Weight Bearing Restrictions: No  Balance Screening Balance Screen Has the patient fallen in the past 6 months: No Has the patient had a decrease in activity level because of a fear of falling? : No Is the patient reluctant to leave their home because of a fear of falling? : No  Prior Functioning  Home Living Family/patient expects to be discharged to:: Private residence Living Arrangements: Children;Other (Comment) (Son) Available Help at Discharge: Family Type of Home: House Home Access: Stairs to enter Secretary/administrator of Steps:  3 Entrance Stairs-Rails: None Home Layout: One level Home Equipment: Environmental consultant - 2 wheels;Shower seat;Bedside commode;Wheelchair - manual (RW with arm trough)  Lives With: Son;Family Prior Function Level of Independence: Independent with basic ADLs;Independent with homemaking with ambulation;Independent with gait  Able to Take Stairs?: Yes Driving: Yes Vocation: Full time employment Vocation Requirements: Production designer, theatre/television/film at [employer] Leisure: Hobbies-no  Cognition/Observation Cognition Overall Cognitive Status: Within Functional Limits for tasks assessed Arousal/Alertness: Awake/alert Orientation Level: Oriented X4 Safety/Judgment: Impaired  Sensation/Coordination/Flexibility/Functional Tests Sensation Light Touch: Appears Intact Stereognosis: Not tested Hot/Cold: Not tested Proprioception: Appears Intact Coordination Gross Motor Movements are Fluid and Coordinated: No Fine Motor Movements are Fluid and Coordinated: No  Assessment RUE Assessment RUE Assessment: Exceptions to [facility] RUE Strength RUE Overall Strength Comments: Pt has elevation (quarter range), elbow flexion (half range), pronation, no supination, no digit extenion, some digit flexion (with some flexor tone), trace wrist extension, and no wrist flexion,  RLE Strength Right Hip Flexion: 2/5 Right Hip Extension: 2-/5 Right Hip ABduction: 2/5 Right Hip ADduction: 2/5 Right Knee Flexion: 2-/5 Right Knee Extension: 2-/5 Right Ankle Dorsiflexion: 1/5 Right Ankle Plantar Flexion: 1/5 Right Ankle Inversion: 1/5 Right Ankle Eversion: 1/5  Exercise/Treatments Mobility/Balance  Berg Balance Test Sit to Stand: Able to stand without using hands and stabilize independently Standing Unsupported: Able to stand safely 2 minutes Sitting with Back Unsupported but Feet Supported on Floor or Stool: Able to sit safely and securely 2 minutes Stand to Sit: Uses backs of legs against chair to control descent Transfers: Able to transfer  safely, definite need of  hands Standing Unsupported with Eyes Closed: Able to stand 10 seconds safely Standing Ubsupported with Feet Together: Able to place feet together independently and stand for 1 minute with supervision From Standing, Reach Forward with Outstretched Arm: Can reach forward >12 cm safely (5") From Standing Position, Pick up Object from Floor: Able to pick up shoe, needs supervision From Standing Position, Turn to Look Behind Over each Shoulder: Turn sideways only but maintains balance Turn 360 Degrees: Needs close supervision or verbal cueing Standing Unsupported, Alternately Place Feet on Step/Stool: Needs assistance to keep from falling or unable to try Standing Unsupported, One Foot in Front: Able to plae foot ahead of the other independently and hold 30 seconds Standing on One Leg: Tries to lift leg/unable to hold 3 seconds but remains standing independently Total Score: 37  Seated Long Arc Quad: 5 reps Heel Slides: 5 reps Other Seated Knee Exercises: ankle dorsi/plantarflexion x 5 Supine Quad Sets: 5 reps Heel Slides: 5 reps Bridges: 5 reps Sidelying   Prone  Hamstring Curl: Limitations Hamstring Curl Limitations: done sidelying x 5;  Hip Extension: 5 reps;Limitations Hip Extension Limitations: done side lying    Physical Therapy Assessment and Plan PT Assessment and Plan Clinical Impression Statement: Pt is a 52 yo male who had a Lt CVA involving his Rt side; he is referred to physical therapy to maximize his functional ability.  Exam demonstrates weakness in all major mm of his LE as well as balance deficits as well as increased tone..  Pt will benefit from skilled PT to maximize his functional abillity.  Pt will benefit from skilled therapeutic intervention in order to improve on the following deficits: Abnormal gait;Decreased activity tolerance;Decreased balance;Decreased mobility;Decreased strength;Difficulty walking;Impaired tone Rehab Potential:  Good PT Frequency: Min 3X/week PT Duration: 6 weeks PT Treatment/Interventions: Gait training;Therapeutic activities;Therapeutic exercise;Balance training;Neuromuscular re-education;Patient/family education PT Plan: Continue to see pt working on proper gt,(pt does not keep his shoulders squared nor does he have equal step lengths); improving balance and strength.    Goals Home Exercise Program Pt/caregiver will Perform Home Exercise Program: For increased strengthening PT Goal: Perform Home Exercise Program - Progress: Goal set today PT Short Term Goals Time to Complete Short Term Goals: 3 weeks PT Short Term Goal 1: Pt to be able to stand for 20 minutes to socialize PT Short Term Goal 2: Pt to be able to walk with QC inside x 15 minutes. PT Short Term Goal 3: Pt strength to be increased 1/2 grade to allow the above to occur. PT Short Term Goal 4: Pt Berg to be increased 7 pts to improve safety of ambulation. PT Long Term Goals Time to Complete Long Term Goals:  (6 weeks.) PT Long Term Goal 1: Pt to be I in advance HEP PT Long Term Goal 2: PT to be able to walk outside with cane x 30 mintues Long Term Goal 3: Pt to be able to walk inside without an assistive device Long Term Goal 4: Pt to berg score to increase by 14 points to improve safety of ambulation  Problem List Patient Active Problem List   Diagnosis Date Noted  . Difficulty in walking(719.7) 03/08/2013  . Right leg weakness 03/08/2013  . Balance problems 03/08/2013  . Weakness of right upper extremity 03/08/2013  . Knee pain, right anterior 02/15/2013  . CVA (cerebral infarction) 02/01/2013  . Gout 08/05/2012  . HTN (hypertension) 08/05/2012    PT - End of Session Equipment Utilized During Treatment: Gait belt General Behavior  During Therapy: WFL for tasks assessed/performed PT Plan of Care PT Home Exercise Plan: given  GP    Gizella [place],CINDY 03/08/2013, 4:55 PM  Physician Documentation Your signature is  required to indicate approval of the treatment plan as stated above.  Please sign and either send electronically or make a copy of this report for your files and return this physician signed original.   Please mark one 1.__approve of plan  2. ___approve of plan with the following conditions.   ______________________________                                                          _____________________ Physician Signature                                                                                                             Date

## 2013-03-09 NOTE — Progress Notes (Signed)
   Subjective:    Patient ID: Bruce Guerrero, male    DOB: 08/10/1960, 53 y.o.   MRN: [id number]  HPI Patient presents today for hospital follow up. He was diagnosed with CVA and has some right side weakness. Reports taking taking medications as prescribed and working on a healthier diet.     Review of Systems  Constitutional: Negative for fever and chills.  Eyes: Negative for pain.  Respiratory: Negative for chest tightness and shortness of breath.   Cardiovascular: Negative for chest pain and palpitations.  Neurological: Positive for weakness. Negative for dizziness and numbness.       Right side weakness due to stroke  All other systems reviewed and are negative.       Objective:   Physical Exam  Constitutional: He is oriented to person, place, and time. He appears well-developed and well-nourished.  HENT:  Head: Normocephalic and atraumatic.  Eyes: EOM are normal. Pupils are equal, round, and reactive to light.  Neck: Normal range of motion. Neck supple. No thyromegaly present.  Cardiovascular: Normal rate, regular rhythm and normal heart sounds.   Pulmonary/Chest: Effort normal and breath sounds normal. No respiratory distress. He exhibits no tenderness.  Abdominal: Soft. Bowel sounds are normal. He exhibits no distension. There is no tenderness.  Lymphadenopathy:    He has no cervical adenopathy.  Neurological: He is alert and oriented to person, place, and time.  Strength of right upper extremity is 1/5 and right lower extremity is 2/5.  Skin: Skin is warm and dry.  Psychiatric: He has a normal mood and affect.          Assessment & Plan:  1. Hypertension and 2. Hospital discharge follow-up  - CMP14+EGFR - CBC With differential/Platelet -continue current medications -monitor blood pressure, maintain diary and bring to next visit Patient verbalized understanding Coralie Keens, FNP-C

## 2013-03-14 ENCOUNTER — Ambulatory Visit (HOSPITAL_COMMUNITY)
Admission: RE | Admit: 2013-03-14 | Discharge: 2013-03-14 | Disposition: A | Discharge status: Home / Self Care | Payer: BC Managed Care – PPO | Source: Ambulatory Visit | Attending: Physical Medicine & Rehabilitation | Admitting: Physical Medicine & Rehabilitation

## 2013-03-14 DIAGNOSIS — I69998 Other sequelae following unspecified cerebrovascular disease: Secondary | ICD-10-CM | POA: Insufficient documentation

## 2013-03-14 DIAGNOSIS — R269 Unspecified abnormalities of gait and mobility: Secondary | ICD-10-CM | POA: Insufficient documentation

## 2013-03-14 DIAGNOSIS — R279 Unspecified lack of coordination: Secondary | ICD-10-CM | POA: Insufficient documentation

## 2013-03-14 DIAGNOSIS — R262 Difficulty in walking, not elsewhere classified: Secondary | ICD-10-CM | POA: Insufficient documentation

## 2013-03-14 DIAGNOSIS — M6281 Muscle weakness (generalized): Secondary | ICD-10-CM | POA: Insufficient documentation

## 2013-03-14 DIAGNOSIS — IMO0001 Reserved for inherently not codable concepts without codable children: Secondary | ICD-10-CM | POA: Insufficient documentation

## 2013-03-14 NOTE — Progress Notes (Signed)
Physical Therapy Treatment Patient Details  Name: Bruce Guerrero MRN: [id number] Date of Birth: [birth year]  Today's Date: 03/14/2013 Time: 4098-11911524-1603 PT Time Calculation (min): 39 min Charges: Gait training x 20'(1526-1546)NMR x 12'(1550-1602)  Visit#: 2 of 18  Re-eval: 04/07/13  Authorization: BCBS   Subjective: Symptoms/Limitations Symptoms: Pt reports HEP compliance. Pain Assessment Currently in Pain?: No/denies   Exercise/Treatments Standing Gait Training: Around dept. with rolling walker then with hemi walker; multimodal cueing to align shoulders and improve knee/hip flexion of RLE Balance Exercises Standing Standing Eyes Opened: Narrow base of support (BOS);1 rep;30 secs SLS: Hand held assist (HHA) 1;Eyes open;2 reps;15 secs Marching: 10 reps Other Standing Exercises: Weight shift R/L A/P  Physical Therapy Assessment and Plan PT Assessment and Plan Clinical Impression Statement: Therapist facilitated activities to improve proprioceptive control and gait mechanics. Pt requires multimodal cueing to align shoulders and hips when walking. Pt tolerates gait with hemi walker well and displays improve gait mechanics when using device. Pt requires 3 seated rest breaks during session. PT Plan: Continue to progress gait, balance and strength per PT POC.     Problem List Patient Active Problem List   Diagnosis Date Noted  . Difficulty in walking(719.7) 03/08/2013  . Right leg weakness 03/08/2013  . Balance problems 03/08/2013  . Weakness of right upper extremity 03/08/2013  . Knee pain, right anterior 02/15/2013  . CVA (cerebral infarction) 02/01/2013  . Gout 08/05/2012  . HTN (hypertension) 08/05/2012    PT - End of Session Equipment Utilized During Treatment: Gait belt Activity Tolerance: Patient tolerated treatment well General Behavior During Therapy: [facility] for tasks assessed/performed   Seth Bakeebekah Thaxton Pelley, PTA 03/14/2013, 5:18 PM

## 2013-03-16 ENCOUNTER — Ambulatory Visit (HOSPITAL_COMMUNITY)
Admission: RE | Admit: 2013-03-16 | Discharge: 2013-03-16 | Disposition: A | Discharge status: Home / Self Care | Payer: BC Managed Care – PPO | Source: Ambulatory Visit | Attending: Physical Medicine & Rehabilitation | Admitting: Physical Medicine & Rehabilitation

## 2013-03-16 DIAGNOSIS — R2689 Other abnormalities of gait and mobility: Secondary | ICD-10-CM

## 2013-03-16 DIAGNOSIS — R29898 Other symptoms and signs involving the musculoskeletal system: Secondary | ICD-10-CM

## 2013-03-16 DIAGNOSIS — R262 Difficulty in walking, not elsewhere classified: Secondary | ICD-10-CM

## 2013-03-16 NOTE — Progress Notes (Signed)
Physical Therapy Treatment Patient Details  Name: Bruce Guerrero: [id number] Date of Birth: [birth year]  Today's Date: 03/16/2013 Time: 1430-1520 PT Time Calculation (min): 50 min Charge neurore-ed 1434-1510;ther ex 1510-1520 Visit#: 3 of 18  Re-eval: 04/07/13    Authorization: BCBS  Subjective: Symptoms/Limitations Symptoms: Pt states he has bad knees and does not think he will be able to sustain tall kneeling activities. Pain Assessment Currently in Pain?: No/denies Pain Score: 0-No pain     Exercise/Treatments   Balance Exercises Standing SLS: Hand held assist (HHA) 1;3 reps;10 secs;Limitations SLS Limitations: therapist blocking pt knees Tandem Gait: Limitations Tandem Gait Limitations: tandem stance R in front then Lt; x 2  Sidestepping: 2 reps;Limitations Sidestepping Limitations: retro gt x 2 RT  [facility] Rotation: Solid surface;Right turn;Left turn Marching: 10 reps Sit to Stand: Standard surface     Seated Toe Raise:  (Nu step L2 x 10:00') Other Seated Exercises: knee flexion foot on pillow case x 10 Other Seated Exercises: LAQ w/quad tapping and AA at end range x 10      Manual Therapy Manual Therapy: Scapular mobilization Scapular Mobilization: Seated edge of mat, faciliated scapular elevation and retraction  Splinting Splinting: Patient fit with prefabricated resting hand splint for right hand to be used at night and at times of rest to decrease flexor synergy pattern in hand and improve volitional extension of digits.   Physical Therapy Assessment and Plan PT Assessment and Plan Clinical Impression Statement: Therapist facilitated exercises to improve equal weight bearing.  Pt cued to keep Lt pelvic area forward thoughout activities.  Pt did not require rest breaks this session. Pt will benefit from skilled therapeutic intervention in order to improve on the following deficits: Abnormal gait;Decreased activity tolerance;Decreased balance;Decreased  mobility;Decreased strength;Difficulty walking;Impaired tone Rehab Potential: Good PT Frequency: Min 3X/week PT Duration: 6 weeks PT Treatment/Interventions: Gait training;Therapeutic activities;Therapeutic exercise;Balance training;Neuromuscular re-education;Patient/family education PT Plan: Continue to progress gait, balance and strength per PT POC.     Goals  progressing  Problem List Patient Active Problem List   Diagnosis Date Noted  . Difficulty in walking(719.7) 03/08/2013  . Right leg weakness 03/08/2013  . Balance problems 03/08/2013  . Weakness of right upper extremity 03/08/2013  . Knee pain, right anterior 02/15/2013  . CVA (cerebral infarction) 02/01/2013  . Gout 08/05/2012  . HTN (hypertension) 08/05/2012    PT - End of Session Equipment Utilized During Treatment: Gait belt Activity Tolerance: Patient tolerated treatment well General Behavior During Therapy: [facility]WFL for tasks assessed/performed  GP    Bruce Guerrero,Bruce Guerrero 03/16/2013, 4:48 PM

## 2013-03-16 NOTE — Progress Notes (Signed)
Occupational Therapy Treatment Patient Details  Name: Bruce Guerrero Johal MRN: [id number] Date of Birth: [birth year]  Today'Guerrero Date: 03/16/2013 Time: 7829-56211349-1430 OT Time Calculation (min): 41 min Neuro reeducation 6941'  Visit#: 2 of 36  Re-eval: 04/05/13    Authorization: BCBS  Authorization Time Period:    Authorization Visit#:   of    Subjective Guerrero:  Ive been lifting my arm up over my head and then leaning it down through my legs.  Pain Assessment Currently in Pain?: No/denies Pain Score: 0-No pain  Exercise/Treatments Neurological Re-education Exercises Shoulder Flexion: Supine;AAROM;10 reps Shoulder ABduction: Sidelying;AAROM;10 reps Shoulder Protraction: Supine;AAROM;10 reps Shoulder External Rotation: Supine;AAROM;10 reps Shoulder Internal Rotation: Supine;AAROM;10 reps Elbow Flexion: Supine;AAROM;10 reps;AROM;5 reps Elbow Extension: Supine;AAROM;10 reps;AROM;5 reps Forearm Supination: Supine;AAROM;10 reps Forearm Pronation: Supine;AAROM;10 reps Wrist Flexion: Supine;AAROM;10 reps Wrist Extension: Supine;AAROM;10 reps  Weight Bearing Exercises Seated with weight on hand: sat edge of mat with RUE with OTR/L facilitating at wrist and elbow min faciliation.  Patient then weight shifted off and on arm 10 minutes Standing with weight shifting on and off: stood with hand in walker splint, educated patient on weightbearing through RUE while walking with walker.  With min vg and tactile cues, patient able to weightbear, ease and independence increased with active walking while weightbearing.  Development of Reach  Hand over Hand Assistance while Reaching: Patient able to maintain grasp on small ball and reach forward to 70 degrees of flexion with min-mod faciliation.    Grasp and Release Grasp and Release:  (able to grasp and maintain grasp on saebo ball, unable to release        Manual Therapy Manual Therapy: Scapular mobilization Scapular Mobilization: Seated edge of mat,  faciliated scapular elevation and retraction  Splinting Splinting: Patient fit with prefabricated resting hand splint for right hand to be used at night and at times of rest to decrease flexor synergy pattern in hand and improve volitional extension of digits.   Occupational Therapy Assessment and Plan OT Assessment and Plan Clinical Impression Statement: A:  Patient with improved volitional movement in RUE.  In supine, AAROM of shoulder 50-75%, elbow 75%, forearm 50%, wrist 25%.  Patient demonstrated SBA with donning and doffing resting hand splint this date.  OT Plan: P:  Improve volitional movement in RUE with functional activities, improve digit extension, follow up on use of splint at night.    Goals Short Term Goals Time to Complete Short Term Goals: 6 weeks Short Term Goal 1: Pt will be educated on HEP. Short Term Goal 1 Progress: Progressing toward goal Short Term Goal 2: Pt will be educated on use and wear of hand splint to decrease flexor tone. Short Term Goal 2 Progress: Progressing toward goal Short Term Goal 3: Pt will increase use of RUE to engage in AAROM in daily activities, 25% of the time. Short Term Goal 3 Progress: Progressing toward goal Short Term Goal 4: Pt will increase AROM in right shoulder to 25 degress of flexion to improve use of RUE in ADLs. Short Term Goal 4 Progress: Progressing toward goal Short Term Goal 5: Pt will don RLE AFO and shoe with modified independence Short Term Goal 5 Progress: Progressing toward goal Additional Short Term Goals?: Yes Short Term Goal 6: Pt will tolerate weightbearing on RUE for 1 minute to improve use of RUE in ADLs Short Term Goal 6 Progress: Progressing toward goal Short Term Goal 7: Pt will tolerate wear of hand splint for 6 hours per day, with no  signs/symptoms of skin breakdown Short Term Goal 7 Progress: Progressing toward goal Long Term Goals Time to Complete Long Term Goals: 12 weeks Long Term Goal 1: Pt will increase  use of RUE to engage in AROM in daily activities, 50% of the time. Long Term Goal 1 Progress: Progressing toward goal Long Term Goal 2: Pt will increase AROM in right shoulder to 45 degress of flexion to improve use of RUE in ADLs. Long Term Goal 2 Progress: Progressing toward goal Long Term Goal 3: Pt will tolerate weightbearing on RUE for 5 minutes to improve use of RUE in ADLs Long Term Goal 3 Progress: Progressing toward goal Long Term Goal 4: Pt will attain 3+/5 strength in RUE to engage in daily acticities w RUE. Long Term Goal 4 Progress: Progressing toward goal Long Term Goal 5: Pt will don/doff hand splint w modified independence. Long Term Goal 5 Progress: Progressing toward goal  Problem List Patient Active Problem List   Diagnosis Date Noted  . Difficulty in walking(719.7) 03/08/2013  . Right leg weakness 03/08/2013  . Balance problems 03/08/2013  . Weakness of right upper extremity 03/08/2013  . Knee pain, right anterior 02/15/2013  . CVA (cerebral infarction) 02/01/2013  . Gout 08/05/2012  . HTN (hypertension) 08/05/2012    End of Session Activity Tolerance: Patient tolerated treatment well General Behavior During Therapy: [facility] for tasks assessed/performed  GO    Shirlean Mylar, OTR/L  03/16/2013, 3:06 PM

## 2013-03-18 ENCOUNTER — Ambulatory Visit (HOSPITAL_COMMUNITY)
Admission: RE | Admit: 2013-03-18 | Discharge: 2013-03-18 | Disposition: A | Discharge status: Home / Self Care | Payer: BC Managed Care – PPO | Source: Ambulatory Visit | Attending: Physical Medicine & Rehabilitation | Admitting: Physical Medicine & Rehabilitation

## 2013-03-18 DIAGNOSIS — R262 Difficulty in walking, not elsewhere classified: Secondary | ICD-10-CM

## 2013-03-18 DIAGNOSIS — R2689 Other abnormalities of gait and mobility: Secondary | ICD-10-CM

## 2013-03-18 DIAGNOSIS — R29898 Other symptoms and signs involving the musculoskeletal system: Secondary | ICD-10-CM

## 2013-03-18 NOTE — Progress Notes (Signed)
Physical Therapy Treatment Patient Details  Name: Bruce Guerrero MRN: [id number] DaHazle Quantte of Birth: 1961/01/05  Today's Date: 03/18/2013 Time: 0850-0935 PT Time Calculation (min): 45 min Charge:  Neuro reed 850-925; there ex [phone] Visit#: 4 of 18  Re-eval: 04/07/13    Authorization: BCBS  Subjective: Symptoms/Limitations Symptoms: Pt states he has been doing his exercises.  Pt states that he can tell that walking is getting easier. Pain Assessment Currently in Pain?: No/denies    Exercise/Treatments      Balance Exercises Standing Tandem Gait: Limitations Tandem Gait Limitations: tandem stance R in front then Lt; x 2  Retro Gait: 2 reps Sidestepping: 2 reps;Limitations Cone Rotation: Solid surface;Right turn;Left turn Marching: 10 reps Sit to Stand: Standard surface Other Standing Exercises: heel raise x 10; step forward shift wt from back to front foot x 10      Seated Toe Raise:  (Nu step L2 x 10:00') Other Seated Exercises: knee flexion foot on pillow case x 10 Other Seated Exercises: LAQ w/quad tapping and AA at end range x 10   Physical Therapy Assessment and Plan PT Assessment and Plan Clinical Impression Statement: PT completed exercises with less facilitation keeping weight equalized and not rotating trunk as much during exercises.   PT needed  to take three rest breaks throughout treatment.  Noted improved ham and glut strength. PT Plan: Continue to progress gait, balance and strength per PT POC.     Goals    Problem List Patient Active Problem List   Diagnosis Date Noted  . Difficulty in walking(719.7) 03/08/2013  . Right leg weakness 03/08/2013  . Balance problems 03/08/2013  . Weakness of right upper extremity 03/08/2013  . Knee pain, right anterior 02/15/2013  . CVA (cerebral infarction) 02/01/2013  . Gout 08/05/2012  . HTN (hypertension) 08/05/2012    PT - End of Session Equipment Utilized During Treatment: Gait belt Activity Tolerance:  Patient tolerated treatment well General Behavior During Therapy: [facility] tasks assessed/performed  GP    [name],CINDY 03/18/2013, 9:56 AM

## 2013-03-21 ENCOUNTER — Other Ambulatory Visit: Payer: Self-pay | Admitting: *Deleted

## 2013-03-21 ENCOUNTER — Ambulatory Visit (HOSPITAL_COMMUNITY)
Admission: RE | Admit: 2013-03-21 | Discharge: 2013-03-21 | Disposition: A | Discharge status: Home / Self Care | Payer: BC Managed Care – PPO | Source: Ambulatory Visit | Attending: Family Medicine | Admitting: Family Medicine

## 2013-03-21 NOTE — Telephone Encounter (Signed)
Pt was put on this during hospital stay for nervousness since stroke, pls. Refill, he really needs per pt. Route to nurse to call into [facility], saw you 03/07/13

## 2013-03-21 NOTE — Progress Notes (Signed)
Occupational Therapy Treatment Patient Details  Name: Bruce Guerrero MRN: [id number] Date of Birth: [birth year]  Today's Date: 03/18/2013 Time: 0930-1018 OT Time Calculation (min): 48 min Neuro Re-education 0930-1018 (48')   Visit#: 3 of 36  Re-eval: 04/05/13    Authorization: BCBS  Authorization Time Period:    Authorization Visit#:   of    Subjective Symptoms/Limitations Symptoms: S:  My hand didnt tighten up till i got here.  Pain Assessment Currently in Pain?: No/denies   Exercise/Treatments Seated Retraction: AAROM;15 reps (arm supported on table top )   Worked on scapular depression, retraction for humeral head approximation and depression to decrease upper trapezius contraction/compensation.  Neurological Re-education Exercises Shoulder Flexion: Seated;AAROM;Self ROM;10 reps Shoulder Protraction: Seated;AAROM;Self ROM;15 reps Shoulder Horizontal ABduction: Seated;AAROM;Self ROM;10 reps Elbow Flexion: AAROM;Seated;15 reps;Supine;10 reps Elbow Extension: AAROM;Seated;15 reps;Supine;10 reps Forearm Supination: PROM;10 reps Forearm Pronation: PROM;10 reps Wrist Flexion: PROM;10 reps Wrist Extension: PROM;10 reps  Weight Bearing Exercises Seated with weight on hand: Sat edge of mat with RUE      Manual Therapy Manual Therapy: Scapular mobilization Scapular Mobilization: Seated edge of mat, faciliated scapular elevation and retraction  Occupational Therapy Assessment and Plan OT Assessment and Plan Clinical Impression Statement: A:  Patient reports good follow through/wear of hand splint issued at last session.  Patient states he has noted decreased tone in hand upon waking when splint is worn.  he demos good understanding this date of postural/positioning considerations to prevent/protect hs shoulder and decreased compensatory movements to place hand onto walker following treatment tis date.  patient reports increased complaint of fatigue on his back muscles  following therapy this date.  OT Plan: P:  Improve volitional movement in RUE with functional activities, improve digit extension. follow up postural/positioning in regards to shoulder   Goals Short Term Goals Short Term Goal 1: Pt will be educated on HEP. Short Term Goal 1 Progress: Progressing toward goal Short Term Goal 2: Pt will be educated on use and wear of hand splint to decrease flexor tone. Short Term Goal 2 Progress: Progressing toward goal Short Term Goal 3: Pt will increase use of RUE to engage in AAROM in daily activities, 25% of the time. Short Term Goal 3 Progress: Progressing toward goal Short Term Goal 4: Pt will increase AROM in right shoulder to 25 degress of flexion to improve use of RUE in ADLs. Short Term Goal 4 Progress: Progressing toward goal Short Term Goal 5: Pt will don RLE AFO and shoe with modified independence Short Term Goal 5 Progress: Progressing toward goal Short Term Goal 6: Pt will tolerate weightbearing on RUE for 1 minute to improve use of RUE in ADLs Short Term Goal 6 Progress: Progressing toward goal Short Term Goal 7: Pt will tolerate wear of hand splint for 6 hours per day, with no signs/symptoms of skin breakdown Short Term Goal 7 Progress: Progressing toward goal Long Term Goals Long Term Goal 1: Pt will increase use of RUE to engage in AROM in daily activities, 50% of the time. Long Term Goal 1 Progress: Progressing toward goal Long Term Goal 2: Pt will increase AROM in right shoulder to 45 degress of flexion to improve use of RUE in ADLs. Long Term Goal 2 Progress: Progressing toward goal Long Term Goal 3: Pt will tolerate weightbearing on RUE for 5 minutes to improve use of RUE in ADLs Long Term Goal 3 Progress: Progressing toward goal Long Term Goal 4: Pt will attain 3+/5 strength in RUE to engage  in daily acticities w RUE. Long Term Goal 4 Progress: Progressing toward goal Long Term Goal 5: Pt will don/doff hand splint w modified  independence. Long Term Goal 5 Progress: Progressing toward goal  Problem List Patient Active Problem List   Diagnosis Date Noted  . Difficulty in walking(719.7) 03/08/2013  . Right leg weakness 03/08/2013  . Balance problems 03/08/2013  . Weakness of right upper extremity 03/08/2013  . Knee pain, right anterior 02/15/2013  . CVA (cerebral infarction) 02/01/2013  . Gout 08/05/2012  . HTN (hypertension) 08/05/2012    End of Session Activity Tolerance: Patient tolerated treatment well General Behavior During Therapy: [facility] for tasks assessed/performed  GO     03/18/2013, 10:13 AM

## 2013-03-21 NOTE — Progress Notes (Signed)
Physical Therapy Treatment Patient Details  Name: Bruce Guerrero MRN: [id number] Date of Birth: June 23, 1960  Today's Date: 03/21/2013 Time: 1352-1430 PT Time Calculation (min): 38 min Charges: Gait x 12'(1354-1306) Therex x 40'(9811-914722'(1408-1430)   Visit#: 5 of 18  Re-eval: 04/07/13  Authorization: BCBS    Subjective: Symptoms/Limitations Symptoms: Pt reports continued HEP compliance. Pain Assessment Currently in Pain?: No/denies  Exercise/Treatments Balance Exercises Standing Marching: 10 reps Sit to Stand: Standard surface;Limitations Sit to Stand Limitations: 5 x without UE assistance Other Standing Exercises: step forward shift wt from back to front foot x 10  Other Standing Exercises: Gait training 248' with hemi walker with CGA  Seated Other Seated Exercises: knee flexion foot on pillow case x 10 Other Seated Exercises: LAQ w/quad tapping and AA at end range x 10   Physical Therapy Assessment and Plan PT Assessment and Plan Clinical Impression Statement: Therapist facilitated activities to improve proprioceptive control, strength and gait mechanics. Pt displays improved shoulder alignment with gait training. Pt tends to circumduct RLE and requires multimodal cueing to increase hip and knee flexion. Pt requests to hold NuStep secondary to fatigue. PT Plan: Continue to progress gait, balance and strength per PT POC.      Problem List Patient Active Problem List   Diagnosis Date Noted  . Difficulty in walking(719.7) 03/08/2013  . Right leg weakness 03/08/2013  . Balance problems 03/08/2013  . Weakness of right upper extremity 03/08/2013  . Knee pain, right anterior 02/15/2013  . CVA (cerebral infarction) 02/01/2013  . Gout 08/05/2012  . HTN (hypertension) 08/05/2012    PT - End of Session Equipment Utilized During Treatment: Gait belt Activity Tolerance: Patient tolerated treatment well General Behavior During Therapy: [facility] for tasks assessed/performed  Seth Bakeebekah  Sohum Delillo, PTA  03/21/2013, 5:22 PM

## 2013-03-23 ENCOUNTER — Ambulatory Visit (HOSPITAL_COMMUNITY)
Admission: RE | Admit: 2013-03-23 | Discharge: 2013-03-23 | Disposition: A | Discharge status: Home / Self Care | Payer: BC Managed Care – PPO | Source: Ambulatory Visit | Attending: Physical Medicine & Rehabilitation | Admitting: Physical Medicine & Rehabilitation

## 2013-03-23 NOTE — Progress Notes (Signed)
Physical Therapy Treatment Patient Details  Name: Bruce Guerrero MRN: [id number] Date of BirHazle Quantth: 1960/05/10  Today's Date: 03/23/2013 Time: 1324-40101435-1511 PT Time Calculation (min): 36 min Charges: Gait x 12'(1435-1447) NMR x 22'(1449-1511)  Visit#: 6 of 18  Re-eval: 04/07/13  Authorization: BCBS    Subjective: Symptoms/Limitations Symptoms: Pt is pain free and without complaint. Reports continued HEP compliance. Pain Assessment Currently in Pain?: No/denies   Exercise/Treatments Standing Gait Training: Around dept. with quad cane and CGA Sidelying Other Sidelying Knee Exercises: Hip flexion x 10 AROM Other Sidelying Knee Exercises: Knee extension AAROM x 10 Prone  Hamstring Curl: 10 reps;Limitations Hamstring Curl Limitations: AAROM Seated Other Seated Exercises: knee flexion foot on pillow case x 10 Other Seated Exercises: LAQ w/quad tapping and AA at end range x 10  Other Seated Knee Exercises: Hip flxion x 10  Physical Therapy Assessment and Plan PT Assessment and Plan Clinical Impression Statement: Therapist facilitated activities to improve RLE AROM and gait mechanics. Pt displays improved active knee flexion. Pt requires muscle tapping to facilitate improved hamstring contraction in prone. Pt requires manual assistance to avoid leaning back when completing seated hip flexion. Progressed to gait training to quad cane. Pt displays improved gait mechanics and postural alignment with quad cane compared to hemi walker. PT Plan: Continue to progress gait, balance and strength per PT POC. Resume NuStep incorporating RUE with coban/ace wrap.     Problem List Patient Active Problem List   Diagnosis Date Noted  . Difficulty in walking(719.7) 03/08/2013  . Right leg weakness 03/08/2013  . Balance problems 03/08/2013  . Weakness of right upper extremity 03/08/2013  . Knee pain, right anterior 02/15/2013  . CVA (cerebral infarction) 02/01/2013  . Gout 08/05/2012  . HTN  (hypertension) 08/05/2012    PT - End of Session Equipment Utilized During Treatment: Gait belt Activity Tolerance: Patient tolerated treatment well General Behavior During Therapy: [facility] tasks assessed/performed  Seth Bakeebekah Amie Cowens, PTA  03/23/2013, 4:03 PM

## 2013-03-24 ENCOUNTER — Other Ambulatory Visit: Payer: Self-pay | Admitting: *Deleted

## 2013-03-24 MED ORDER — LORAZEPAM 0.5 MG PO TABS: ORAL_TABLET | ORAL | Status: DC

## 2013-03-24 NOTE — Telephone Encounter (Signed)
Pt was put on Ativan in hospital due to anxiety related to stroke. Please refill for him, he saw Mae on 03/07/13, for hosp follow up

## 2013-03-25 ENCOUNTER — Ambulatory Visit (HOSPITAL_COMMUNITY)
Admission: RE | Admit: 2013-03-25 | Discharge: 2013-03-25 | Disposition: A | Discharge status: Home / Self Care | Payer: BC Managed Care – PPO | Source: Ambulatory Visit | Attending: Physical Medicine & Rehabilitation | Admitting: Physical Medicine & Rehabilitation

## 2013-03-25 DIAGNOSIS — R2689 Other abnormalities of gait and mobility: Secondary | ICD-10-CM

## 2013-03-25 DIAGNOSIS — R262 Difficulty in walking, not elsewhere classified: Secondary | ICD-10-CM

## 2013-03-25 DIAGNOSIS — R29898 Other symptoms and signs involving the musculoskeletal system: Secondary | ICD-10-CM

## 2013-03-25 NOTE — Progress Notes (Signed)
Physical Therapy Treatment Patient Details  Name: Bruce Guerrero MRN: [id number] Date of Birth: Jan 26, 1961  Today's Date: 03/25/2013 Time: 0865-78461350-1434 PT Time Calculation (min): 44 min TE: 14' NMR: 30' Visit#: 7 of 18  Re-eval: 04/07/13    Authorization: BCBS  Authorization Time Period:    Authorization Visit#:   of     Subjective: Symptoms/Limitations Symptoms:  He states he is having difficulty with bending his knee and is working on it at home by Dollar Generallaying on his stomach.  Pain Assessment Currently in Pain?: No/denies  Precautions/Restrictions     Exercise/Treatments Aerobic Stationary Bike: NuStep: 10 minutes BUE A (RUE A w/ace) Hills #2, Resistance 4.  Seated Other Seated Knee Exercises: Dyna Disc sitting x5 minutes  Supine Other Supine Knee Exercises: Rt hip D1 and D2 pattern x15 w/PT facilation and manual resistance when able Sidelying Other Sidelying Knee Exercises: Powder Board with PT facilation for all motions: knee flexion and extension, resistance with hip distraction and elevation, hip flexion and extension w/manual resistance x8 minutes . Prone         Physical Therapy Assessment and Plan PT Assessment and Plan Clinical Impression Statement: Resumed NuStep with acebandage to RUE. Utlitized powder board to improve hamstring and hip musculature to improve gait and function in gravity elimated postion. Pt with notable fatigue at end of session. Attempted tall kneeling activities with increased pain to knee. Ended with postural training on Dynadisc    Goals    Problem List Patient Active Problem List   Diagnosis Date Noted  . Difficulty in walking(719.7) 03/08/2013  . Right leg weakness 03/08/2013  . Balance problems 03/08/2013  . Weakness of right upper extremity 03/08/2013  . Knee pain, right anterior 02/15/2013  . CVA (cerebral infarction) 02/01/2013  . Gout 08/05/2012  . HTN (hypertension) 08/05/2012    PT - End of Session Equipment Utilized  During Treatment: Gait belt Activity Tolerance: Patient tolerated treatment well General Behavior During Therapy: [facility] for tasks assessed/performed  GP    Lauralye Kinn, MPT 03/25/2013, 2:45 PM

## 2013-03-28 ENCOUNTER — Ambulatory Visit (HOSPITAL_COMMUNITY)
Admission: RE | Admit: 2013-03-28 | Discharge: 2013-03-28 | Disposition: A | Discharge status: Home / Self Care | Payer: BC Managed Care – PPO | Source: Ambulatory Visit | Attending: Physical Medicine & Rehabilitation | Admitting: Physical Medicine & Rehabilitation

## 2013-03-28 NOTE — Progress Notes (Signed)
Physical Therapy Treatment Patient Details  Name: Bruce Guerrero MRN: [id number] Date of Birth: November 22, 1960  Today's Date: 03/28/2013 Time: 4098-11911350-1429 PT Time Calculation (min): 39 min Charges: Gait training x 12'(1350-1422) Therex x 47'(8295-621325'(1424-1429)  Visit#: 8 of 18  Re-eval: 04/07/13  Authorization: BCBS   Subjective: Symptoms/Limitations Symptoms: Pt reports continued HEP compliance. Pain Assessment Currently in Pain?: No/denies   Exercise/Treatments Standing Gait Training: Around dept. with quad cane and CGA Seated Heel Slides: 10 reps;Limitations Heel Slides Limitations: foot on pillow case Supine Bridges: 10 reps Straight Leg Raises: 10 reps;Right Sidelying Hip ABduction: 10 reps;Right Other Sidelying Knee Exercises: Hip flexion x 10 AAROM Prone  Hamstring Curl: 10 reps;Limitations Hamstring Curl Limitations: AAROM   Physical Therapy Assessment and Plan PT Assessment and Plan Clinical Impression Statement: Pt displays improved muscle contraction in right hamstring, gluteal and quad muscles. Pt responds well to muscle tapping for improved contrition. Stability with gait and gait mechanics continue to improve. Activity tolerance also appears to be improving. PT Plan: Continue to progress gait, balance and strength per PT POC.     Problem List Patient Active Problem List   Diagnosis Date Noted  . Difficulty in walking(719.7) 03/08/2013  . Right leg weakness 03/08/2013  . Balance problems 03/08/2013  . Weakness of right upper extremity 03/08/2013  . Knee pain, right anterior 02/15/2013  . CVA (cerebral infarction) 02/01/2013  . Gout 08/05/2012  . HTN (hypertension) 08/05/2012    PT - End of Session Equipment Utilized During Treatment: Gait belt Activity Tolerance: Patient tolerated treatment well General Behavior During Therapy: [facility] assessed/performed  Seth Bakeebekah Landri Dorsainvil, PTA  03/28/2013, 2:35 PM

## 2013-03-30 ENCOUNTER — Ambulatory Visit (HOSPITAL_COMMUNITY)
Admission: RE | Admit: 2013-03-30 | Discharge: 2013-03-30 | Disposition: A | Discharge status: Home / Self Care | Payer: BC Managed Care – PPO | Source: Ambulatory Visit | Attending: Physical Medicine & Rehabilitation | Admitting: Physical Medicine & Rehabilitation

## 2013-03-30 NOTE — Progress Notes (Signed)
Physical Therapy Treatment Patient Details  Name: Bruce Guerrero MRN: [id number] Date of Birth: March 01, 1961  Today's Date: 03/30/2013 Time: 3664-40341435-1514 PT Time Calculation (min): 39 min Charges: Therex x 18'(1435-1453) NMR x 74'(2595-638712'(1454-1506) Gait x [phone]'(1506-1514)  Visit#: 9 of 18  Re-eval: 04/07/13  Authorization: BCBS    Subjective: Symptoms/Limitations Symptoms: Pt states that he is noticing improvements in his strength and balance. Pain Assessment Currently in Pain?: No/denies  Precautions/Restrictions     Exercise/Treatments  Therex  Standing Gait Training: Around dept. with quad cane and CGA Seated Long Arc Quad: 10 reps;AAROM Other Seated Knee Exercises: Heel raise x 10 Supine Bridges: 10 reps Straight Leg Raises: 10 reps;Right Other Supine Knee Exercises: Clams x 10 RLE Other Supine Knee Exercises: Acte abd/add x 10 Sidelying Hip ABduction: 10 reps;Right Prone  Hamstring Curl: 10 reps Hamstring Curl Limitations: AROM  Balance Exercises Standing Tandem Stance: 2 reps;Intermittent HHA;Eyes open;30 secs Standing, One Foot on a Step: Eyes open;6 inch;1 rep Standing, One Foot on a Step Limitations: 1' LLE on step Marching: 10 reps;Hand held assist (HHA) 1 Heel Raises: 10 reps   Physical Therapy Assessment and Plan PT Assessment and Plan Clinical Impression Statement: Therapist facilitated activities to improve LE strength, balance and gait mechanics. Pt appears to be progressing well overall. Improved contraction noted in right hamstring and quad. Pt displays improved right hip flexion with marching. Gait stability with quad cane continues to improve.  PT Plan: Continue to progress gait, balance, and strength per PT POC. Progress ankle strengthening next session.     Problem List Patient Active Problem List   Diagnosis Date Noted  . Difficulty in walking(719.7) 03/08/2013  . Right leg weakness 03/08/2013  . Balance problems 03/08/2013  . Weakness of right  upper extremity 03/08/2013  . Knee pain, right anterior 02/15/2013  . CVA (cerebral infarction) 02/01/2013  . Gout 08/05/2012  . HTN (hypertension) 08/05/2012    PT - End of Session Equipment Utilized During Treatment: Gait belt Activity Tolerance: Patient tolerated treatment well General Behavior During Therapy: [facility] for tasks assessed/performed  Seth Bakeebekah Teyanna Thielman, PTA  03/30/2013, 4:49 PM

## 2013-04-01 ENCOUNTER — Ambulatory Visit (HOSPITAL_COMMUNITY)
Admission: RE | Admit: 2013-04-01 | Discharge: 2013-04-01 | Disposition: A | Discharge status: Home / Self Care | Payer: BC Managed Care – PPO | Source: Ambulatory Visit | Attending: Family Medicine | Admitting: Family Medicine

## 2013-04-01 ENCOUNTER — Encounter: Payer: Self-pay | Admitting: Physical Medicine & Rehabilitation

## 2013-04-01 ENCOUNTER — Ambulatory Visit (HOSPITAL_BASED_OUTPATIENT_CLINIC_OR_DEPARTMENT_OTHER): Payer: BC Managed Care – PPO | Admitting: Physical Medicine & Rehabilitation

## 2013-04-01 ENCOUNTER — Encounter: Payer: BC Managed Care – PPO | Attending: Physical Medicine & Rehabilitation

## 2013-04-01 VITALS — BP 142/72 | HR 68 | Resp 14 | Ht 64.0 in | Wt 179.0 lb

## 2013-04-01 DIAGNOSIS — G8110 Spastic hemiplegia affecting unspecified side: Secondary | ICD-10-CM

## 2013-04-01 DIAGNOSIS — I10 Essential (primary) hypertension: Secondary | ICD-10-CM | POA: Insufficient documentation

## 2013-04-01 DIAGNOSIS — I69959 Hemiplegia and hemiparesis following unspecified cerebrovascular disease affecting unspecified side: Secondary | ICD-10-CM | POA: Insufficient documentation

## 2013-04-01 DIAGNOSIS — I6350 Cerebral infarction due to unspecified occlusion or stenosis of unspecified cerebral artery: Secondary | ICD-10-CM

## 2013-04-01 DIAGNOSIS — I639 Cerebral infarction, unspecified: Secondary | ICD-10-CM

## 2013-04-01 NOTE — Patient Instructions (Signed)
We will scheduled for Botox and get pre-approval from insurance  No driving Continue therapy Disabled from work at the current time

## 2013-04-01 NOTE — Progress Notes (Signed)
Subjective:    Patient ID: Bruce Guerrero, male    DOB: November 05, 1960, 53 y.o.   MRN: [id number] A 53 year old right-handed male with history of hypertension,  independent prior to admission, working full-time. Admitted on February 01, 2013, with right-sided weakness and slurred speech. MRI of the  brain showed a moderately enlarged deep white matter infarction on the  left. MRA of the head with no large vessel occlusion identified.  Carotid Dopplers with no ICA stenosis. Echocardiogram with ejection  fraction of 60% and grade 1 diastolic dysfunction. The patient did not  receive tPA. Neurology Service was consulted, maintained on Plavix,  aspirin therapy, and later just Plavix alone DATE OF ADMISSION: 02/08/2013  DATE OF DISCHARGE: 02/26/2013  HPI Outpatient PT and OT at [facility], 3 times per week No falls at home Spasticity improved mainly affects the right foot Diclofenac gel for right knee uses it also on the left knee Mood is okay still takes Zoloft Pain Inventory Average Pain 1 Pain Right Now 1 My pain is dull  In the last 24 hours, has pain interfered with the following? General activity 0 Relation with others 0 Enjoyment of life 0 What TIME of day is your pain at its worst? evening Sleep (in general) Fair  Pain is worse with: n/a Pain improves with: n/a Relief from Meds: n/a  Mobility walk with assistance use a walker how many minutes can you walk? 10 ability to climb steps?  yes do you drive?  no Do you have any goals in this area?  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  no  Physicians involved in your care Primary care Vernon Prey   Family History  Problem Relation Age of Onset  . COPD Mother   . COPD Father   . Cancer Sister   . Hyperlipidemia Brother   . Diabetes Brother    History   Social History  .  Marital Status: Single    Spouse Name: N/A    Number of Children: N/A  . Years of Education: N/A   Social History Main Topics  . Smoking status: Never Smoker   . Smokeless tobacco: None  . Alcohol Use: No  . Drug Use: No  . Sexual Activity: None   Other Topics Concern  . None   Social History Narrative  . None   History reviewed. No pertinent past surgical history. Past Medical History  Diagnosis Date  . Gout   . Hypertension   . Stroke    BP 142/72  Pulse 68  Resp 14  Ht 5\' 4"  (1.626 m)  Wt 179 lb (81.194 kg)  BMI 30.71 kg/m2  SpO2 96%     Review of Systems  Musculoskeletal: Positive for gait problem.  Neurological: Positive for weakness.  All other systems reviewed and are negative.       Objective:   Physical Exam  Right finger flexors modified Ashworth score 3 Right wrist flexors modified Ashworth score 3 Right biceps modified Ashworth score 2  Trace right wrist extension 2 minus wrist flexion and finger flexion but with increased tone 3 minus deltoid and biceps on the right 2 minus ankle dorsiflexor on the right 4 minus knee extensor on the right 3/5 right hamstring  Left side is 5/5  Ambulates with 80 wheeled walker no evidence of overt or knee instability however has a stiff leg  gait On the right side     Assessment & Plan:  1. Right spastic hemiplegia secondary to left CVA, deep white matter infarct on the left side 02/01/2013 Discussed no driving Discussed not able to work at the current time Discussed spasticity management including Botox injection which would be helpful given that he still has severe spasticity right finger flexors and wrist flexors despite baclofen and extensive physical therapy  Over half of the 25 min visit was spent counseling and coordinating care.

## 2013-04-04 ENCOUNTER — Ambulatory Visit (HOSPITAL_COMMUNITY)
Admission: RE | Admit: 2013-04-04 | Discharge: 2013-04-04 | Disposition: A | Discharge status: Home / Self Care | Payer: BC Managed Care – PPO | Source: Ambulatory Visit | Attending: Physical Medicine & Rehabilitation | Admitting: Physical Medicine & Rehabilitation

## 2013-04-04 NOTE — Progress Notes (Signed)
Physical Therapy Treatment Patient Details  Name: Bruce Guerrero MRN: [id number] Date of Birth: 12-01-1960  Today's Date: 04/04/2013 Time: 1522-1601 PT Time Calculation (min): 39 min Visit#: 10 of 18  Re-eval: 04/07/13 Authorization: BCBS  Charges:  therex 38'  Subjective: Pt states he is doing well without pain or  Complaints.  Exercise/Treatments Standing Gait Training: Around dept.2RT  with quad cane and CGA Seated Long Arc Quad: 15 reps Other Seated Knee Exercises: hip flexion 15 reps Sit to Stand: Standard surface;Limitations Sit to Stand Limitations: Rt LE back, no UE's 10 reps slow controlled  Supine Short Arc Quad Sets: 10 reps;Limitations Bridges: 15 reps Straight Leg Raises: 15 reps Sidelying Hip ABduction: 15 reps;Limitations Hip ABduction Limitations: AAROM Other Sidelying Knee Exercises: hamstring curl 15 reps with sliding board Prone  Hamstring Curl: 10 reps Hamstring Curl Limitations: AAROM Other Prone Exercises: quad set/hip extension with AAROM 10 reps    Physical Therapy Assessment and Plan PT Assessment and Plan Clinical Impression Statement: Pt with extreme substitituion of core and lumbar mm with prone hamstring curls and attempts at hip extension.  Able to isolate mm better in sidelying.  Cued patient to work on toe rolling to promote knee flexion with ambulation with rt LE. PT Plan: Add ankle strengthening exercises next session.  Continue to progress; ;attempt gait with SPC next visit.     Problem List Patient Active Problem List   Diagnosis Date Noted  . Difficulty in walking(719.7) 03/08/2013  . Right leg weakness 03/08/2013  . Balance problems 03/08/2013  . Weakness of right upper extremity 03/08/2013  . Knee pain, right anterior 02/15/2013  . CVA (cerebral infarction) 02/01/2013  . Gout 08/05/2012  . HTN (hypertension) 08/05/2012    PT - End of Session Equipment Utilized During Treatment: Gait belt Activity Tolerance: Patient  tolerated treatment well General Behavior During Therapy: [facility] for tasks assessed/performed   Lurena NidaAmy B Frazier, PTA/CLT 04/04/2013, 4:04 PM

## 2013-04-06 ENCOUNTER — Ambulatory Visit (HOSPITAL_COMMUNITY)
Admission: RE | Admit: 2013-04-06 | Discharge: 2013-04-06 | Disposition: A | Discharge status: Home / Self Care | Payer: BC Managed Care – PPO | Source: Ambulatory Visit | Attending: Physical Medicine & Rehabilitation | Admitting: Physical Medicine & Rehabilitation

## 2013-04-06 NOTE — Progress Notes (Signed)
Occupational Therapy Treatment Patient Details  Name: Bruce Guerrero MRN: [id number] Date of Birth: [birth year]  Today's Date: 04/06/2013 Time: 1430-1520 OT Time Calculation (min): 50 min NeuroMuscular Re-Edu 1430-1520 (50')  Visit#: 11 of 36  Re-eval: 04/05/13    Authorization: BCBS  Authorization Time Period:    Authorization Visit#:   of    Subjective Symptoms/Limitations Symptoms: S:  I cant really feel the back of my arm trying to move Pain Assessment Currently in Pain?: No/denies   Exercise/Treatments Neurological Re-education Exercises Shoulder Flexion: Sidelying Elbow Flexion: Sidelying Elbow Extension: Sidelying Forearm Supination: Sidelying Forearm Pronation: Sidelying   Powder board in sidelying with use of tapping stimulation for facilation of lower traps, triceps and posterior deltoid to assist in tone management and activation of arm extensor pattern away from patients resting flexion pattern.  Good tolerance however patient reports not feeling muscle firing that is visually apparent to this therapist.  Utilized cone in hand for decreased friction on board and to assist with relaxing hand/preventing increased flexor tone with muscle strain.      Occupational Therapy Assessment and Plan OT Assessment and Plan Clinical Impression Statement: A:  Patient demos improved ability to relax for tricep/elbow extension this date.  He continues to report inabiliyt to feel any muscle firing of tricep muscles.  worked in sdielying this date for flexion extension of shoulder and elbow as well as supination/pronation .  patient with noted muscle fatigue  and min complaint of UE fatigue follow treatment of tapping this date..   OT Plan: P:  Reassess;  estim RUE, continue with NMR, weightbearing.     Goals Short Term Goals Short Term Goal 1: Pt will be educated on HEP. Short Term Goal 1 Progress: Progressing toward goal Short Term Goal 2: Pt will be educated on use and  wear of hand splint to decrease flexor tone. Short Term Goal 2 Progress: Progressing toward goal Short Term Goal 3: Pt will increase use of RUE to engage in AAROM in daily activities, 25% of the time. Short Term Goal 3 Progress: Progressing toward goal Short Term Goal 4: Pt will increase AROM in right shoulder to 25 degress of flexion to improve use of RUE in ADLs. Short Term Goal 4 Progress: Progressing toward goal Short Term Goal 5: Pt will don RLE AFO and shoe with modified independence Short Term Goal 5 Progress: Progressing toward goal Short Term Goal 6: Pt will tolerate weightbearing on RUE for 1 minute to improve use of RUE in ADLs Short Term Goal 6 Progress: Progressing toward goal Short Term Goal 7: Pt will tolerate wear of hand splint for 6 hours per day, with no signs/symptoms of skin breakdown Short Term Goal 7 Progress: Progressing toward goal Long Term Goals Long Term Goal 1: Pt will increase use of RUE to engage in AROM in daily activities, 50% of the time. Long Term Goal 1 Progress: Progressing toward goal Long Term Goal 2: Pt will increase AROM in right shoulder to 45 degress of flexion to improve use of RUE in ADLs. Long Term Goal 2 Progress: Progressing toward goal Long Term Goal 3: Pt will tolerate weightbearing on RUE for 5 minutes to improve use of RUE in ADLs Long Term Goal 3 Progress: Progressing toward goal Long Term Goal 4: Pt will attain 3+/5 strength in RUE to engage in daily acticities w RUE. Long Term Goal 4 Progress: Progressing toward goal Long Term Goal 5: Pt will don/doff hand splint w modified independence. Long Term  Goal 5 Progress: Progressing toward goal  Problem List Patient Active Problem List   Diagnosis Date Noted  . Difficulty in walking(719.7) 03/08/2013  . Right leg weakness 03/08/2013  . Balance problems 03/08/2013  . Weakness of right upper extremity 03/08/2013  . Knee pain, right anterior 02/15/2013  . CVA (cerebral infarction)  02/01/2013  . Gout 08/05/2012  . HTN (hypertension) 08/05/2012    End of Session Activity Tolerance: Patient tolerated treatment well General Behavior During Therapy: [facility] for tasks assessed/performed  GO    [name], OTR/L  04/06/2013, 10:44 PM

## 2013-04-06 NOTE — Progress Notes (Signed)
Physical Therapy Treatment Patient Details  Name: Bruce Guerrero MRN: [id number] Date of Birth: 1960/09/20  Today's Date: 04/06/2013 Time: 1522-1600 PT Time Calculation (min): 38 min Visit#: 11 of 18  Re-eval: 04/07/13 Authorization: BCBS  Charges:  therex 6213-0865([id number](23'), gait 1545-1600 (15')  Subjective: Symptoms/Limitations Symptoms: Pt states he is feeling good today without complaints other than a little soreness. Pain Assessment Currently in Pain?: No/denies   Exercise/Treatments Aerobic Tread Mill: gait trainer with harness  0.6mph 4'X2 bouts  +2 assist for LE's Supine Heel Slides: 2 sets;10 reps Bridges: 2 sets;10 reps Sidelying Other Sidelying Knee Exercises: sliding board knee flexion/extension 2 sets 10 reps     Physical Therapy Assessment and Plan PT Assessment and Plan Clinical Impression Statement: Focused session on ambulation quality; added gait trainer with harness with assist +2.  Pt tends to scissor Lt LE into Rt LE and requires assistance to increase knee flexion/toe off with Rt LE.  Pt only able to tolerate 4 minute sessions before rest.  Good mm contraction with sidelying knee flexion/extension on sliding board, however requires AA to decrease hip/core substitution. PT Plan: Continue with gait training to increase Rt knee flexion;  Progress [facility] to [facility] and increasing ham/quad strength.     Problem List Patient Active Problem List   Diagnosis Date Noted  . Difficulty in walking(719.7) 03/08/2013  . Right leg weakness 03/08/2013  . Balance problems 03/08/2013  . Weakness of right upper extremity 03/08/2013  . Knee pain, right anterior 02/15/2013  . CVA (cerebral infarction) 02/01/2013  . Gout 08/05/2012  . HTN (hypertension) 08/05/2012    PT - End of Session Equipment Utilized During Treatment: Gait belt Activity Tolerance: Patient tolerated treatment well General Behavior During Therapy: [facility] for tasks assessed/performed    Lurena NidaAmy B Frazier,  PTA/CLT 04/06/2013, 5:29 PM

## 2013-04-08 ENCOUNTER — Ambulatory Visit (HOSPITAL_COMMUNITY)
Admission: RE | Admit: 2013-04-08 | Discharge: 2013-04-08 | Disposition: A | Discharge status: Home / Self Care | Payer: BC Managed Care – PPO | Source: Ambulatory Visit | Attending: Family Medicine | Admitting: Family Medicine

## 2013-04-08 ENCOUNTER — Encounter: Payer: Self-pay | Admitting: General Practice

## 2013-04-08 ENCOUNTER — Ambulatory Visit (INDEPENDENT_AMBULATORY_CARE_PROVIDER_SITE_OTHER): Payer: BC Managed Care – PPO | Admitting: General Practice

## 2013-04-08 VITALS — BP 123/74 | HR 56 | Temp 96.8°F | Ht 64.0 in | Wt 176.0 lb

## 2013-04-08 DIAGNOSIS — F3289 Other specified depressive episodes: Secondary | ICD-10-CM

## 2013-04-08 DIAGNOSIS — I639 Cerebral infarction, unspecified: Secondary | ICD-10-CM

## 2013-04-08 DIAGNOSIS — E785 Hyperlipidemia, unspecified: Secondary | ICD-10-CM

## 2013-04-08 DIAGNOSIS — R29898 Other symptoms and signs involving the musculoskeletal system: Secondary | ICD-10-CM

## 2013-04-08 DIAGNOSIS — R262 Difficulty in walking, not elsewhere classified: Secondary | ICD-10-CM

## 2013-04-08 DIAGNOSIS — I2510 Atherosclerotic heart disease of native coronary artery without angina pectoris: Secondary | ICD-10-CM

## 2013-04-08 DIAGNOSIS — R2689 Other abnormalities of gait and mobility: Secondary | ICD-10-CM

## 2013-04-08 DIAGNOSIS — M62838 Other muscle spasm: Secondary | ICD-10-CM

## 2013-04-08 DIAGNOSIS — I6350 Cerebral infarction due to unspecified occlusion or stenosis of unspecified cerebral artery: Secondary | ICD-10-CM

## 2013-04-08 DIAGNOSIS — I10 Essential (primary) hypertension: Secondary | ICD-10-CM

## 2013-04-08 DIAGNOSIS — F329 Major depressive disorder, single episode, unspecified: Secondary | ICD-10-CM

## 2013-04-08 DIAGNOSIS — M109 Gout, unspecified: Secondary | ICD-10-CM

## 2013-04-08 DIAGNOSIS — F32A Depression, unspecified: Secondary | ICD-10-CM

## 2013-04-08 LAB — POCT CBC
GRANULOCYTE PERCENT: 56.9 % (ref 37–80)
HEMATOCRIT: 49.7 % (ref 43.5–53.7)
HEMOGLOBIN: 15.4 g/dL (ref 14.1–18.1)
Lymph, poc: 2.9 (ref 0.6–3.4)
MCH: 29.5 pg (ref 27–31.2)
MCHC: 31 g/dL — AB (ref 31.8–35.4)
MCV: 95.3 fL (ref 80–97)
MPV: 9.3 fL (ref 0–99.8)
POC Granulocyte: 4.3 (ref 2–6.9)
POC LYMPH PERCENT: 37.9 %L (ref 10–50)
Platelet Count, POC: 154 10*3/uL (ref 142–424)
RBC: 5.2 M/uL (ref 4.69–6.13)
RDW, POC: 12.8 %
WBC: 7.6 10*3/uL (ref 4.6–10.2)

## 2013-04-08 MED ORDER — BACLOFEN 5 MG HALF TABLET: 5.0000 mg | ORAL_TABLET | Freq: Three times a day (TID) | ORAL | Status: DC | PRN

## 2013-04-08 MED ORDER — AMLODIPINE BESYLATE 10 MG PO TABS: 10.0000 mg | ORAL_TABLET | Freq: Every day | ORAL | Status: DC

## 2013-04-08 MED ORDER — CARVEDILOL 6.25 MG PO TABS: 6.2500 mg | ORAL_TABLET | Freq: Two times a day (BID) | ORAL | Status: DC

## 2013-04-08 MED ORDER — SERTRALINE HCL 50 MG PO TABS: 50.0000 mg | ORAL_TABLET | Freq: Every day | ORAL | Status: DC

## 2013-04-08 MED ORDER — CLOPIDOGREL BISULFATE 75 MG PO TABS: 75.0000 mg | ORAL_TABLET | Freq: Every day | ORAL | Status: DC

## 2013-04-08 MED ORDER — ATORVASTATIN CALCIUM 80 MG PO TABS: 80.0000 mg | ORAL_TABLET | Freq: Every day | ORAL | Status: DC

## 2013-04-08 MED ORDER — COLCHICINE 0.6 MG PO TABS: 0.6000 mg | ORAL_TABLET | Freq: Every day | ORAL | Status: DC

## 2013-04-08 NOTE — Evaluation (Signed)
Physical Therapy Evaluation  Patient Details  Name: Bruce Guerrero MRN: [id number] Date of Birth: [birth year]  Today's Date: 04/08/2013 Time: 2703-5009 PT Time Calculation (min): 45 min Neuro re education 3818 -2993             Visit#: 12 of 30  Re-eval: 05/08/13 Assessment Diagnosis: CVA  Next MD Visit: 04/01/2013 Prior Therapy: Inpt rehab.  Authorization: BCBS     Authorization Time Period:    Authorization Visit#:   of     Past Medical History:  Past Medical History  Diagnosis Date  . Gout   . Hypertension   . Stroke    Past Surgical History: No past surgical history on file.  Subjective Symptoms/Limitations Symptoms: no new complaints, pain free, controls knee pain through use of aleve at night  Pertinent History: independent prior to admission, working full-time who sustained a L CVA on 02/01/2013.  He was admitted to [facility] and then admitted to the IP rehab floor.  He was discharge on12/20/2014 and is now referred to OP PT to maximize his functional ability Pain Assessment Currently in Pain?: No/denies               Exercise/Treatment:      Strong focus this visit on neuro re education in standing and during gait.    Standing Gait Training: Around dept.2RT  with quad cane and CGA Other Standing Knee Exercises: gait along red line with cues for longer stpes B, trial of one lap around clinic with straight cane    Balance Exercises Standing SLS with Vectors: Hand held assist (HHA) 1 (front/side/back B LE 10 reps each leg ) Standing, One Foot on a Step: Eyes open;4 inch;15 secs (also forward modified lunge onto 4 in step B 10 x each ) Sidestepping: 2 reps;Limitations Marching: 10 reps;Hand held assist (HHA) 1  Right lowe extemity stepping onto blue target on floor with 1 hand assist x 2 min     Seated Other Seated Exercises: seated hip flexion 2 x 10 R   Other Seated Exercises: LAQ w/quad tapping and AA at end range x 10  Supine- resisted  trunk rotations with green t band  15 x each direction        Physical Therapy Assessment and Plan PT Assessment and Plan Clinical Impression Statement: focused this session on neuro  re education during gait and standing to increase right leg stride and steps, improve R LE stability, and improve R lower extremity control. Patient progressing well towards goals and is using quad cane and starting to use straight cane. Furtehr rehab required for R LE strength, gait, safety, and stability. Without further therapy patient at great risk for decline in ambulation status, decreased activity at home, and muscle weakness  Pt will benefit from skilled therapeutic intervention in order to improve on the following deficits: Abnormal gait;Decreased activity tolerance;Decreased balance;Decreased mobility;Decreased strength;Difficulty walking;Impaired tone Rehab Potential: Good PT Frequency: Min 3X/week PT Duration: 6 weeks PT Treatment/Interventions: Gait training;Therapeutic activities;Therapeutic exercise;Balance training;Neuromuscular re-education;Patient/family education PT Plan: Continue with gait training to increase Rt knee flexion;  Progress gait to [facility] and increasing ham/quad strength.    Goals Home Exercise Program Pt/caregiver will Perform Home Exercise Program: For increased strengthening PT Short Term Goals Time to Complete Short Term Goals: 3 weeks PT Short Term Goal 1: Pt to be able to stand for 20 minutes to socialize PT Short Term Goal 1 - Progress: Partly met PT Short Term Goal 2: Pt to be able  to walk with QC inside x 15 minutes. PT Short Term Goal 2 - Progress: Partly met PT Short Term Goal 3: Pt strength to be increased 1/2 grade to allow the above to occur. PT Short Term Goal 3 - Progress: Partly met PT Short Term Goal 4: Pt Berg to be increased 7 pts to improve safety of ambulation. PT Long Term Goals Time to Complete Long Term Goals:  (6 weeks.) PT Long Term Goal 1: Pt to be I  in advance HEP PT Long Term Goal 1 - Progress: Progressing toward goal PT Long Term Goal 2: PT to be able to walk outside with cane x 30 mintues PT Long Term Goal 2 - Progress: Partly met Long Term Goal 3: Pt to be able to walk inside without an assistive device Long Term Goal 3 Progress: Not met Long Term Goal 4: Pt to berg score to increase by 14 points to improve safety of ambulation  Problem List Patient Active Problem List   Diagnosis Date Noted  . Difficulty in walking(719.7) 03/08/2013  . Right leg weakness 03/08/2013  . Balance problems 03/08/2013  . Weakness of right upper extremity 03/08/2013  . Knee pain, right anterior 02/15/2013  . CVA (cerebral infarction) 02/01/2013  . Gout 08/05/2012  . HTN (hypertension) 08/05/2012    PT - End of Session Equipment Utilized During Treatment: Gait belt General Behavior During Therapy: [facility] for tasks assessed/performed PT Plan of Care Consulted and Agree with Plan of Care: Patient  GP    Sarajane Marek 04/08/2013, 3:36 PM  Physician Documentation Your signature is required to indicate approval of the treatment plan as stated above.  Please sign and either send electronically or make a copy of this report for your files and return this physician signed original.   Please mark one 1.__approve of plan  2. ___approve of plan with the following conditions.   ______________________________                                                          _____________________ Physician Signature                                                                                                             Date

## 2013-04-08 NOTE — Evaluation (Signed)
Occupational Therapy Re-Evaluation  Patient Details  Name: Bruce Guerrero MRN: [id number] Date of Birth: May 08, 1960  Today's Date: 04/08/2013 Time: 8099-8338 OT Time Calculation (min): 40 min Eval 1524-1540 (23') Self-Care 1540-1550 (10') Neuro Re-Ed 1550-1604 (14')  Visit#: 12 of 36  Re-eval: 05/06/13  Assessment Diagnosis: CVA - dominant side Prior Therapy: CIR for 3 weeks - [facility]  Authorization: BCBS  Authorization Time Period:    Authorization Visit#:   of     Past Medical History:  Past Medical History  Diagnosis Date  . Gout   . Hypertension   . Stroke    Past Surgical History: No past surgical history on file.  Subjective Symptoms/Limitations Symptoms: S: "I'm practicing." Pain Assessment Currently in Pain?: No/denies  Precautions/Restrictions  Precautions Precautions: Fall  Assessment ADL/Vision/Perception ADL ADL Comments: Pt states he continued use L for feeding and grooming. Pt dresses indpendently, with extra time for lower extremity. Pt uses shoe buttons, and requires assist for donning/doffing AFO w shoe. Pt independent w toileting and bathing while seated. Pt reports general difficulty in using right hand - cutting food or bilateral tasks. Dominant Hand: Right Vision - History Baseline Vision: No visual deficits  Cognition/Observation Cognition Overall Cognitive Status: Within Functional Limits for tasks assessed Arousal/Alertness: Awake/alert  Sensation/Coordination/Edema Sensation Light Touch: Appears Intact Stereognosis: Not tested Proprioception: Appears Intact Coordination Gross Motor Movements are Fluid and Coordinated: No Fine Motor Movements are Fluid and Coordinated: No Edema Edema: mild edema in right hand  Additional Assessments RUE Assessment RUE Assessment: Exceptions to [facility] RUE Strength RUE Overall Strength Comments: Pt has elevation (half range), elbow flexion (half range), pronation, supination (to neutral),  digit extension (trace in 2nd), digit flexion (50%), wrist extention (trace), wrist flexion (quarter) LUE Assessment LUE Assessment: Within Functional Limits     Exercise/Treatments    Weight Bearing Exercises Seated with weight on hand: Sat edge of mat with RUE. Reaching cross body in medial and right planes Standing with weight shifting on and off: At table, weightshifting to RUE/RLE     Activities of Daily Living Activities of Daily Living: pt given edema glove for right hand - educated on wearing, care, and precautions  Occupational Therapy Assessment and Plan OT Assessment and Plan Clinical Impression Statement: A: Pt met 2/5 LTG and 4/7 STG. Pt is progressing towards 2/7 STG and discontinued 1 STG.  Pt demonstrated improved tolerance of weightbearing this date, but required cueing to direct self in lateral plane (rathern than medial).  Pt will benefit from skilled therapeutic intervention in order to improve on the following deficits: Abnormal gait;Decreased activity tolerance;Decreased balance;Decreased mobility;Decreased strength;Difficulty walking;Impaired tone OT Plan: P:  E-stim RUE, continue NMR   Goals Home Exercise Program Pt/caregiver will Perform Home Exercise Program: For increased strengthening Short Term Goals Short Term Goal 1: Pt will be educated on HEP. Short Term Goal 1 Progress: Progressing toward goal Short Term Goal 2: Pt will be educated on use and wear of hand splint to decrease flexor tone. Short Term Goal 2 Progress: Met Short Term Goal 3: Pt will increase use of RUE to engage in AAROM in daily activities, 25% of the time. Short Term Goal 3 Progress: Met Short Term Goal 4: Pt will increase AROM in right shoulder to 25 degress of flexion to improve use of RUE in ADLs. Short Term Goal 4 Progress: Progressing toward goal Short Term Goal 5: Pt will don RLE AFO and shoe with modified independence Short Term Goal 5 Progress: Discontinued (comment) (pt  no  longer wears AFO) Short Term Goal 6: Pt will tolerate weightbearing on RUE for 1 minute to improve use of RUE in ADLs Short Term Goal 6 Progress: Met Short Term Goal 7: Pt will tolerate wear of hand splint for 6 hours per day, with no signs/symptoms of skin breakdown Short Term Goal 7 Progress: Met Long Term Goals Long Term Goal 1: Pt will increase use of RUE to engage in AROM in daily activities, 50% of the time. Long Term Goal 1 Progress: Progressing toward goal Long Term Goal 2: Pt will increase AROM in right shoulder to 45 degress of flexion to improve use of RUE in ADLs. Long Term Goal 2 Progress: Progressing toward goal Long Term Goal 3: Pt will tolerate weightbearing on RUE for 5 minutes to improve use of RUE in ADLs Long Term Goal 3 Progress: Met Long Term Goal 4: Pt will attain 3+/5 strength in RUE to engage in daily acticities w RUE. Long Term Goal 4 Progress: Progressing toward goal Long Term Goal 5: Pt will don/doff hand splint w modified independence. Long Term Goal 5 Progress: Met  Problem List Patient Active Problem List   Diagnosis Date Noted  . Difficulty in walking(719.7) 03/08/2013  . Right leg weakness 03/08/2013  . Balance problems 03/08/2013  . Weakness of right upper extremity 03/08/2013  . Knee pain, right anterior 02/15/2013  . CVA (cerebral infarction) 02/01/2013  . Gout 08/05/2012  . HTN (hypertension) 08/05/2012    End of Session Activity Tolerance: Patient tolerated treatment well General Behavior During Therapy: [facility] for tasks assessed/performed OT Plan of Care OT Home Exercise Plan: Added weightbearing at kitchen table - weightshifting to [place], [place], OTR/L [phone]  04/08/2013, 4:27 PM  Physician Documentation Your signature is required to indicate approval of the treatment plan as stated above.  Please sign and either send electronically or make a copy of this report for your files and return this physician signed  original.  Please mark one 1.__approve of plan  2. ___approve of plan with the following conditions.   ______________________________                                                          _____________________ Physician Signature                                                                                                             Date

## 2013-04-08 NOTE — Patient Instructions (Signed)

## 2013-04-08 NOTE — Progress Notes (Signed)
   Subjective:    Patient ID: Bruce Guerrero, male    DOB: 19-Jan-1961, 53 y.o.   MRN: [id number]  HPI Patient presents today for chronic health condition follow up. History of HTN, HLD, CAD, depression, CVA, and muscle spasms. Reports taking medications as directed, but denies taking blood pressure at home. Reports eating a low sodium diet.     Review of Systems  Constitutional: Negative for fever and chills.  Eyes: Negative for pain.  Respiratory: Negative for chest tightness and shortness of breath.   Cardiovascular: Negative for chest pain and palpitations.  Neurological: Positive for weakness. Negative for dizziness and numbness.       Right side weakness due to stroke  All other systems reviewed and are negative.       Objective:   Physical Exam  Constitutional: He is oriented to person, place, and time. He appears well-developed and well-nourished.  HENT:  Head: Normocephalic and atraumatic.  Eyes: EOM are normal. Pupils are equal, round, and reactive to light.  Neck: Normal range of motion. Neck supple. No thyromegaly present.  Cardiovascular: Normal rate, regular rhythm and normal heart sounds.   Pulmonary/Chest: Effort normal and breath sounds normal. No respiratory distress. He exhibits no tenderness.  Abdominal: Soft. Bowel sounds are normal. He exhibits no distension. There is no tenderness.  Lymphadenopathy:    He has no cervical adenopathy.  Neurological: He is alert and oriented to person, place, and time.  Strength of right upper extremity is 2/5 and right lower extremity is 2/5.  Skin: Skin is warm and dry.  Psychiatric: He has a normal mood and affect.          Assessment & Plan:  1. HTN (hypertension)  - POCT CBC - CMP14+EGFR - amLODipine (NORVASC) 10 MG tablet; Take 1 tablet (10 mg total) by mouth daily.  Dispense: 30 tablet; Refill: 3 - carvedilol (COREG) 6.25 MG tablet; Take 1 tablet (6.25 mg total) by mouth 2 (two) times daily with a meal.   Dispense: 60 tablet; Refill: 3  2. HLD (hyperlipidemia) - Lipid panel - atorvastatin (LIPITOR) 80 MG tablet; Take 1 tablet (80 mg total) by mouth daily at 6 PM.  Dispense: 30 tablet; Refill: 3  3. Gout  - colchicine 0.6 MG tablet; Take 1 tablet (0.6 mg total) by mouth daily.  Dispense: 30 tablet; Refill: 6  4. Depression  - sertraline (ZOLOFT) 50 MG tablet; Take 1 tablet (50 mg total) by mouth daily.  Dispense: 30 tablet; Refill: 6  5. Muscle spasm of both lower legs  - baclofen (LIORESAL) 5 mg TABS tablet; Take 0.5 tablets (5 mg total) by mouth every 8 (eight) hours as needed (cramping).  Dispense: 30 tablet; Refill: 6  6. CAD (coronary artery disease)  - clopidogrel (PLAVIX) 75 MG tablet; Take 1 tablet (75 mg total) by mouth daily with breakfast.  Dispense: 30 tablet; Refill: 6  7. CVA (cerebral vascular accident) -Continue all current medications Labs pending F/u in 3 months Discussed benefits of regular exercise and healthy eating Patient verbalized understanding Erby Pian, FNP-C

## 2013-04-09 LAB — CMP14+EGFR
A/G RATIO: 2.4 (ref 1.1–2.5)
ALK PHOS: 104 IU/L (ref 39–117)
ALT: 35 IU/L (ref 0–44)
AST: 19 IU/L (ref 0–40)
Albumin: 4.6 g/dL (ref 3.5–5.5)
BUN / CREAT RATIO: 11 (ref 9–20)
BUN: 16 mg/dL (ref 6–24)
CO2: 25 mmol/L (ref 18–29)
CREATININE: 1.43 mg/dL — AB (ref 0.76–1.27)
Calcium: 10 mg/dL (ref 8.7–10.2)
Chloride: 99 mmol/L (ref 97–108)
GFR, EST AFRICAN AMERICAN: 65 mL/min/{1.73_m2} (ref >59)
GFR, EST NON AFRICAN AMERICAN: 56 mL/min/{1.73_m2} — AB (ref >59)
Globulin, Total: 1.9 g/dL (ref 1.5–4.5)
Glucose: 88 mg/dL (ref 65–99)
Potassium: 4.5 mmol/L (ref 3.5–5.2)
SODIUM: 141 mmol/L (ref 134–144)
Total Bilirubin: 0.7 mg/dL (ref 0.0–1.2)
Total Protein: 6.5 g/dL (ref 6.0–8.5)

## 2013-04-09 LAB — LIPID PANEL
CHOL/HDL RATIO: 3.2 ratio (ref 0.0–5.0)
Cholesterol, Total: 109 mg/dL (ref 100–199)
HDL: 34 mg/dL — ABNORMAL LOW (ref >39)
LDL CALC: 48 mg/dL (ref 0–99)
TRIGLYCERIDES: 133 mg/dL (ref 0–149)
VLDL Cholesterol Cal: 27 mg/dL (ref 5–40)

## 2013-04-11 ENCOUNTER — Ambulatory Visit (HOSPITAL_COMMUNITY)
Admission: RE | Admit: 2013-04-11 | Discharge: 2013-04-11 | Disposition: A | Discharge status: Home / Self Care | Payer: BC Managed Care – PPO | Source: Ambulatory Visit | Attending: Family Medicine | Admitting: Family Medicine

## 2013-04-11 DIAGNOSIS — R29898 Other symptoms and signs involving the musculoskeletal system: Secondary | ICD-10-CM

## 2013-04-11 DIAGNOSIS — I69998 Other sequelae following unspecified cerebrovascular disease: Secondary | ICD-10-CM | POA: Insufficient documentation

## 2013-04-11 DIAGNOSIS — R2689 Other abnormalities of gait and mobility: Secondary | ICD-10-CM

## 2013-04-11 DIAGNOSIS — R279 Unspecified lack of coordination: Secondary | ICD-10-CM | POA: Insufficient documentation

## 2013-04-11 DIAGNOSIS — M6281 Muscle weakness (generalized): Secondary | ICD-10-CM | POA: Insufficient documentation

## 2013-04-11 DIAGNOSIS — R262 Difficulty in walking, not elsewhere classified: Secondary | ICD-10-CM | POA: Insufficient documentation

## 2013-04-11 DIAGNOSIS — IMO0001 Reserved for inherently not codable concepts without codable children: Secondary | ICD-10-CM | POA: Insufficient documentation

## 2013-04-11 DIAGNOSIS — R269 Unspecified abnormalities of gait and mobility: Secondary | ICD-10-CM | POA: Insufficient documentation

## 2013-04-11 NOTE — Progress Notes (Signed)
Occupational Therapy Treatment Patient Details  Name: Bruce Guerrero MRN: [id number] Date of Birth: 12/21/1960  Today's Date: 04/11/2013 Time: 1523-1604 OT Time Calculation (min): 41 min E-Stim 1523-1538 (15') Neuromuscular Re-Education 1538-1604 (26')  Visit#: 13 of 36  Re-eval: 05/06/13    Authorization: BCBS   Subjective Symptoms/Limitations Symptoms: S: "Yes, I think the glove is helping." Pain Assessment Currently in Pain?: No/denies  Exercise/Treatments Neurological Re-education Exercises Wrist Extension: 10 reps;AAROM;Seated (with tapping)     Modalities Modalities: Electrical Stimulation Electrical Stimulation Electrical Stimulation Location: right tricep region Electrical Stimulation Action: russian  Electrical Stimulation Parameters: 10/10, 50%, initial 34 intensity, increased to 38. 13 minutes Electrical Stimulation Goals: Neuromuscular facilitation Weight Bearing Technique Weight Bearing Technique: Yes RUE Weight Bearing Technique: Extended arm seated Response to Weight Bearing Technique: Relaxation fo flexor tone in hand, activitaion of elbow extension Activities of Daily Living Activities of Daily Living: Pt reported good use of edema glove - no further questions this date.  Occupational Therapy Assessment and Plan OT Assessment and Plan Clinical Impression Statement: A: Pt indicated sensation of e-stim at lower intensity than previous sessions - at intensity 23. Pt had improved initiation of weightbearing this date without compenstary strategies. OT Plan: P: continue NMR with focus on tricep extension, wrist felxion/extension, and right upper extremity function. use RUE as gross assist in tasks   Goals Short Term Goals Time to Complete Short Term Goals: 6 weeks Short Term Goal 1: Pt will be educated on HEP. Short Term Goal 1 Progress: Progressing toward goal Short Term Goal 2: Pt will be educated on use and wear of hand splint to decrease flexor  tone. Short Term Goal 2 Progress: Met Short Term Goal 3: Pt will increase use of RUE to engage in AAROM in daily activities, 25% of the time. Short Term Goal 3 Progress: Met Short Term Goal 4: Pt will increase AROM in right shoulder to 25 degress of flexion to improve use of RUE in ADLs. Short Term Goal 4 Progress: Progressing toward goal Short Term Goal 5: Pt will don RLE AFO and shoe with modified independence Short Term Goal 5 Progress: Discontinued (comment) Short Term Goal 6: Pt will tolerate weightbearing on RUE for 1 minute to improve use of RUE in ADLs Short Term Goal 6 Progress: Met Short Term Goal 7: Pt will tolerate wear of hand splint for 6 hours per day, with no signs/symptoms of skin breakdown Short Term Goal 7 Progress: Met Long Term Goals Time to Complete Long Term Goals: 12 weeks Long Term Goal 1: Pt will increase use of RUE to engage in AROM in daily activities, 50% of the time. Long Term Goal 1 Progress: Progressing toward goal Long Term Goal 2: Pt will increase AROM in right shoulder to 45 degress of flexion to improve use of RUE in ADLs. Long Term Goal 2 Progress: Progressing toward goal Long Term Goal 3: Pt will tolerate weightbearing on RUE for 5 minutes to improve use of RUE in ADLs Long Term Goal 3 Progress: Met Long Term Goal 4: Pt will attain 3+/5 strength in RUE to engage in daily acticities w RUE. Long Term Goal 4 Progress: Progressing toward goal Long Term Goal 5: Pt will don/doff hand splint w modified independence. Long Term Goal 5 Progress: Met  Problem List Patient Active Problem List   Diagnosis Date Noted  . Difficulty in walking(719.7) 03/08/2013  . Right leg weakness 03/08/2013  . Balance problems 03/08/2013  . Weakness of right upper extremity 03/08/2013  .   Knee pain, right anterior 02/15/2013  . CVA (cerebral infarction) 02/01/2013  . Gout 08/05/2012  . HTN (hypertension) 08/05/2012    End of Session Activity Tolerance: Patient tolerated  treatment well General Behavior During Therapy: WFL for tasks assessed/performed  GO    Marie Rawlings, MS, OTR/L [phone]  04/11/2013, 4:35 PM 

## 2013-04-11 NOTE — Progress Notes (Signed)
Physical Therapy Treatment Patient Details  Name: Bruce Guerrero S Pung MRN: [id number] Date of Birth: 09-Oct-1960  Today's Date: 04/11/2013 Time: 4098-11911430-1515 PT Time Calculation (min): 45 min Charge:  There ex 1430-1455; neuroreed [id number] Visit#: 13 of 30  Re-eval: 05/08/13    Authorization: BCBS    Subjective: Symptoms/Limitations Symptoms: Pt has no complaints Pain Assessment Currently in Pain?: No/denies   Exercise/Treatments   Aerobic Stationary Bike: nustep L 4 hills x 8:00   Standing Heel Raises: 10 reps Forward Lunges: Right;10 reps Side Lunges: Right;10 reps Lateral Step Up: 10 reps;Hand Hold: 1;Step Height: 2" Forward Step Up: 10 reps;Step Height: 2" Functional Squat: 10 reps SLS: x3 Other Standing Knee Exercises: marching x 10; tandem gt x 2 RT; sidestep with t-band x 2 RT Other Standing Knee Exercises: tandem stance x 3 Seated Other Seated Knee Exercises: tband for ankle x 10 @ Other Seated Knee Exercises: sit to stand x 15    Physical Therapy Assessment and Plan PT Assessment and Plan Clinical Impression Statement: focused on balance and strength exercises were facilitated by therapist to maintain correct postrue and form.  Added new exercises to challenge strength  of LE as well as balance PT Plan: continue to progress pt to goal of ambulating witn least assistive device.    Goals  progressing  Problem List Patient Active Problem List   Diagnosis Date Noted  . Difficulty in walking(719.7) 03/08/2013  . Right leg weakness 03/08/2013  . Balance problems 03/08/2013  . Weakness of right upper extremity 03/08/2013  . Knee pain, right anterior 02/15/2013  . CVA (cerebral infarction) 02/01/2013  . Gout 08/05/2012  . HTN (hypertension) 08/05/2012    PT - End of Session Equipment Utilized During Treatment: Gait belt Activity Tolerance: Patient tolerated treatment well General Behavior During Therapy: [facility] assessed/performed  GP     RUSSELL,CINDY 04/11/2013, 3:52 PM

## 2013-04-13 ENCOUNTER — Inpatient Hospital Stay (HOSPITAL_COMMUNITY): Admission: RE | Admit: 2013-04-13 | Payer: BC Managed Care – PPO | Source: Ambulatory Visit

## 2013-04-13 ENCOUNTER — Ambulatory Visit (HOSPITAL_COMMUNITY)
Admission: RE | Admit: 2013-04-13 | Discharge: 2013-04-13 | Disposition: A | Discharge status: Home / Self Care | Payer: BC Managed Care – PPO | Source: Ambulatory Visit | Attending: Family Medicine | Admitting: Family Medicine

## 2013-04-13 DIAGNOSIS — R262 Difficulty in walking, not elsewhere classified: Secondary | ICD-10-CM

## 2013-04-13 DIAGNOSIS — R2689 Other abnormalities of gait and mobility: Secondary | ICD-10-CM

## 2013-04-13 DIAGNOSIS — R29898 Other symptoms and signs involving the musculoskeletal system: Secondary | ICD-10-CM

## 2013-04-13 NOTE — Progress Notes (Addendum)
Physical Therapy Treatment Patient Details  Name: Bruce Guerrero MRN: [id number] Date of Birth: 1960/03/28  Today's Date: 04/13/2013 Time: 1518-1600 PT Time Calculation (min): 42 min Charge TE [id number]  Visit#: 14 of 30  Re-eval: 05/08/13 Assessment Diagnosis: CVA  Next MD Visit: Doroteo BradfordKirstein 05/23/2013 Prior Therapy: [facility].  Authorization: BCBS   Authorization Time Period:    Authorization Visit#:   of     Subjective: Symptoms/Limitations Symptoms: Pain free currently, pt reports most difficulty bending knee with waking Pain Assessment Currently in Pain?: No/denies  Precautions/Restrictions  Precautions Precautions: Fall  Exercise/Treatments Aerobic Stationary Bike: nustep L 4 hills x 8:00 Standing Heel Raises: 10 reps Forward Lunges: Right;10 reps Side Lunges: Right;10 reps Lateral Step Up: 15 reps;Hand Hold: 1;Step Height: 4" Forward Step Up: 15 reps;Step Height: 4";Hand Hold: 1 Step Down: Right;10 reps;Hand Hold: 1;Step Height: 2" Functional Squat: 15 reps Rocker Board: 2 minutes;Limitations Rocker Board Limitations: R/L with mod-max assistance Other Standing Knee Exercises: marching 10x 5" holds Seated Other Seated Knee Exercises: Toe raises 15x 2#  Physical Therapy Assessment and Plan PT Assessment and Plan Clinical Impression Statement: Session focus on functional strengthening exercises to increase Rt knee flexion.  Therapist facilitation required to improve posture and form.  Added rockerboard to improve weight shifting, balance and knee flexion with therapist facilitation required, improved knee flexion on rockerboard PT Plan: continue to progress pt to goal of ambulating witn least assistive device.    Goals    Problem List Patient Active Problem List   Diagnosis Date Noted  . Difficulty in walking(719.7) 03/08/2013  . Right leg weakness 03/08/2013  . Balance problems 03/08/2013  . Weakness of right upper extremity 03/08/2013  . Knee pain,  right anterior 02/15/2013  . CVA (cerebral infarction) 02/01/2013  . Gout 08/05/2012  . HTN (hypertension) 08/05/2012    PT - End of Session Equipment Utilized During Treatment: Gait belt Activity Tolerance: Patient tolerated treatment well General Behavior During Therapy: [facility] tasks assessed/performed  GP    Juel BurrowCockerham, Karrisa Didio Jo 04/13/2013, 4:17 PM

## 2013-04-13 NOTE — Progress Notes (Signed)
Occupational Therapy Treatment Patient Details  Name: Bruce Guerrero MRN: [id number] Date of Birth: 15-Aug-1960  Today's Date: 04/13/2013 Time: 0258-5277 OT Time Calculation (min): 47 min E-Stim 1430-1450 (20') Neuromuscular Re-education 1450-1517 (69')  Visit#: 14 of 44  Re-eval: 05/06/13    Authorization: BCBS  Authorization Time Period:    Authorization Visit#:   of    Subjective Symptoms/Limitations Symptoms: S: "when i've been straightening out my arm in the morning, its gotten sore." (in tricep area with extension) Pain Assessment Currently in Pain?: No/denies  Precautions/Restrictions  Precautions Precautions: Fall  Exercise/Treatments Neurological Re-education Exercises Wrist Flexion: 10 reps;AAROM;Seated (w tapping) Wrist Extension: 10 reps;AAROM;Seated (w tapping)  Weight Bearing Exercises Seated with weight on hand: Sat edge of mat. After initial assist, pt was able to maintain elbo extension and hand flat on mat.     Modalities Modalities: Field seismologist Location: Wrist Flexors and Extensors Printmaker Action: Designer, jewellery Parameters: Reciprocal, 10/10 cycle time, 100 BPS, 50%, 33 amps extension, 26 amps flexion, 15 minutes Electrical Stimulation Goals: Neuromuscular facilitation Weight Bearing Technique Weight Bearing Technique: Yes (After initial assist, pt able to maintain position) RUE Weight Bearing Technique: Extended arm seated  Occupational Therapy Assessment and Plan OT Assessment and Plan Clinical Impression Statement: A: Pt verbalized this date that he had tricep soreness the two mornings following previous e-stim session. Pt tolerated well e-stim to wrist flexors/extensors this date - had improved AAROM with tapping flexion and extension after e-stim at end of session. OT Plan: Continue NMR with focus on tricep extension, wrist flexion/extension, and right UE  function. Using RUE as gross assist in functional tasks.   Goals Short Term Goals Short Term Goal 1: Pt will be educated on HEP. Short Term Goal 1 Progress: Progressing toward goal Short Term Goal 2: Pt will be educated on use and wear of hand splint to decrease flexor tone. Short Term Goal 2 Progress: Met Short Term Goal 3: Pt will increase use of RUE to engage in AAROM in daily activities, 25% of the time. Short Term Goal 3 Progress: Met Short Term Goal 4: Pt will increase AROM in right shoulder to 25 degress of flexion to improve use of RUE in ADLs. Short Term Goal 4 Progress: Progressing toward goal Short Term Goal 5: Pt will don RLE AFO and shoe with modified independence Short Term Goal 5 Progress: Discontinued (comment) Short Term Goal 6: Pt will tolerate weightbearing on RUE for 1 minute to improve use of RUE in ADLs Short Term Goal 6 Progress: Met Short Term Goal 7: Pt will tolerate wear of hand splint for 6 hours per day, with no signs/symptoms of skin breakdown Short Term Goal 7 Progress: Met Long Term Goals Long Term Goal 1: Pt will increase use of RUE to engage in AROM in daily activities, 50% of the time. Long Term Goal 1 Progress: Progressing toward goal Long Term Goal 2: Pt will increase AROM in right shoulder to 45 degress of flexion to improve use of RUE in ADLs. Long Term Goal 2 Progress: Progressing toward goal Long Term Goal 3: Pt will tolerate weightbearing on RUE for 5 minutes to improve use of RUE in ADLs Long Term Goal 3 Progress: Met Long Term Goal 4: Pt will attain 3+/5 strength in RUE to engage in daily acticities w RUE. Long Term Goal 4 Progress: Progressing toward goal Long Term Goal 5: Pt will don/doff hand splint w modified independence. Long Term Goal 5 Progress:  Met  Problem List Patient Active Problem List   Diagnosis Date Noted  . Difficulty in walking(719.7) 03/08/2013  . Right leg weakness 03/08/2013  . Balance problems 03/08/2013  . Weakness  of right upper extremity 03/08/2013  . Knee pain, right anterior 02/15/2013  . CVA (cerebral infarction) 02/01/2013  . Gout 08/05/2012  . HTN (hypertension) 08/05/2012    End of Session Activity Tolerance: Patient tolerated treatment well General Behavior During Therapy: [facility] for tasks assessed/performed  GO    Bea Graff, MS, OTR/L [phone]  04/13/2013, 4:57 PM

## 2013-04-15 ENCOUNTER — Ambulatory Visit (HOSPITAL_COMMUNITY)
Admission: RE | Admit: 2013-04-15 | Discharge: 2013-04-15 | Disposition: A | Discharge status: Home / Self Care | Payer: BC Managed Care – PPO | Source: Ambulatory Visit | Attending: Family Medicine | Admitting: Family Medicine

## 2013-04-15 NOTE — Progress Notes (Signed)
Occupational Therapy Treatment Patient Details  Name: Bruce Guerrero MRN: [id number] Date of Birth: Jul 09, 1960  Today's Date: 04/15/2013 Time: 8433-3274 OT Time Calculation (min): 38 min E-Stim 5793-9650 (10') Neuromuscular Re-Ed 1453-1521 (28')  Visit#: 15 of 36  Re-eval: 05/06/13    Authorization: BCBS  Authorization Time Period:    Authorization Visit#:   of    Subjective Symptoms/Limitations Symptoms: S: "I've just been a very little sore" (tricep region) Pain Assessment Currently in Pain?: No/denies   Exercise/Treatments  Neurological Re-education Exercises Elbow Flexion: Seated (with faciliation to isolate bicep not compensate with should) Elbow Extension: Seated (with estim, tapping, facilitation to not compensate) Wrist Flexion: Seated;15 reps (w tapping) Wrist Extension: Seated;15 reps (with tapping)  Weight Bearing Exercises Seated with weight on hand: Sat edge of mat. Pt pushed through elbow to maintain weightbearing. After approx 6' pt was able to maintain positioning. Held for approx 5 more min.        Modalities Modalities: Copywriter, advertising Location: Elbow extension Electrical Stimulation Action: [place] Electrical Stimulation Parameters: Single, 10/10 cycle time, 100 BPS, 50%, 35 amps, 10 minutes Electrical Stimulation Goals: Neuromuscular facilitation Weight Bearing Technique Weight Bearing Technique: Yes RUE Weight Bearing Technique: Extended arm seated Response to Weight Bearing Technique: Relaxation fo flexor tone in hand, continued activitaion of elbow extension  Occupational Therapy Assessment and Plan OT Assessment and Plan Clinical Impression Statement: A: Pt had improved wrist flexion this date, but tired easily.  Pt demonstrated good elbow extension with tapping - he has continued to notice some soreness/awareness of elbow extensors. OT Plan: P: Continue NMR with focus on tricep  extension (in sidelying), wrist flexion/extension, supination/pronation. Using RUE as gross assist in functional tasks.   Goals Short Term Goals Short Term Goal 1: Pt will be educated on HEP. Short Term Goal 1 Progress: Progressing toward goal Short Term Goal 2: Pt will be educated on use and wear of hand splint to decrease flexor tone. Short Term Goal 2 Progress: Met Short Term Goal 3: Pt will increase use of RUE to engage in AAROM in daily activities, 25% of the time. Short Term Goal 3 Progress: Met Short Term Goal 4: Pt will increase AROM in right shoulder to 25 degress of flexion to improve use of RUE in ADLs. Short Term Goal 4 Progress: Progressing toward goal Short Term Goal 5: Pt will don RLE AFO and shoe with modified independence Short Term Goal 5 Progress: Discontinued (comment) Short Term Goal 6: Pt will tolerate weightbearing on RUE for 1 minute to improve use of RUE in ADLs Short Term Goal 6 Progress: Met Short Term Goal 7: Pt will tolerate wear of hand splint for 6 hours per day, with no signs/symptoms of skin breakdown Short Term Goal 7 Progress: Met Long Term Goals Long Term Goal 1: Pt will increase use of RUE to engage in AROM in daily activities, 50% of the time. Long Term Goal 1 Progress: Progressing toward goal Long Term Goal 2: Pt will increase AROM in right shoulder to 45 degress of flexion to improve use of RUE in ADLs. Long Term Goal 2 Progress: Progressing toward goal Long Term Goal 3: Pt will tolerate weightbearing on RUE for 5 minutes to improve use of RUE in ADLs Long Term Goal 3 Progress: Met Long Term Goal 4: Pt will attain 3+/5 strength in RUE to engage in daily acticities w RUE. Long Term Goal 4 Progress: Progressing toward goal Long Term Goal 5: Pt will don/doff  hand splint w modified independence. Long Term Goal 5 Progress: Met  Problem List Patient Active Problem List   Diagnosis Date Noted  . Difficulty in walking(719.7) 03/08/2013  . Right leg  weakness 03/08/2013  . Balance problems 03/08/2013  . Weakness of right upper extremity 03/08/2013  . Knee pain, right anterior 02/15/2013  . CVA (cerebral infarction) 02/01/2013  . Gout 08/05/2012  . HTN (hypertension) 08/05/2012    End of Session Activity Tolerance: Patient tolerated treatment well General Behavior During Therapy: [facility] for tasks assessed/performed  GO    Bea Graff, MS, OTR/L [phone]  04/15/2013, 4:26 PM

## 2013-04-18 ENCOUNTER — Ambulatory Visit (HOSPITAL_COMMUNITY)
Admission: RE | Admit: 2013-04-18 | Discharge: 2013-04-18 | Disposition: A | Discharge status: Home / Self Care | Payer: BC Managed Care – PPO | Source: Ambulatory Visit | Attending: Family Medicine | Admitting: Family Medicine

## 2013-04-18 NOTE — Progress Notes (Signed)
Physical Therapy Treatment Patient Details  Name: Bruce Guerrero Mayhan MRN: [id number] Date of Birth: 07-04-1960  Today'Guerrero Date: 04/18/2013 Time: 4098-11911440-1522 PT Time Calculation (min): 42 min Visit#: 15 of 30  Re-eval: 05/08/13 Authorization: BCBS   Charges:  therex 42  Subjective: Pain Assessment Currently in Pain?: No/denies   Exercise/Treatments Aerobic Stationary Bike: nustep L 4 hills x 8:00 Standing Heel Raises: 15 reps Forward Lunges: Right;15 reps Side Lunges: Right;10 reps Lateral Step Up: 15 reps;Hand Hold: 1;Step Height: 4" Forward Step Up: 15 reps;Step Height: 4";Hand Hold: 1 Step Down: Right;10 reps;Hand Hold: 1;Step Height: 2" Functional Squat: 15 reps Rocker Board: 2 minutes;Limitations Rocker Board Limitations: R/L static stance  Other Standing Knee Exercises: marching 10x 5" holds Other Standing Knee Exercises: tandem stance x 1 each LE lead, 60" holds without UE'Guerrero Seated Other Seated Knee Exercises: Toe raises 15x 2#     Physical Therapy Assessment and Plan PT Assessment and Plan Clinical Impression Statement: Pt able to complete session today without rest break.  Increased difficulty of rockerboard to static balance.  Pt with difficulty completing without UE support.  Pt able to maintain tandem stance 1 minute with each LE leading without use of UE'Guerrero.  Noted improviement in stability and LE strength. PT Plan: continue to progress pt to goal of ambulating with least assistive device and increasing LE strength.     Problem List Patient Active Problem List   Diagnosis Date Noted  . Difficulty in walking(719.7) 03/08/2013  . Right leg weakness 03/08/2013  . Balance problems 03/08/2013  . Weakness of right upper extremity 03/08/2013  . Knee pain, right anterior 02/15/2013  . CVA (cerebral infarction) 02/01/2013  . Gout 08/05/2012  . HTN (hypertension) 08/05/2012    PT - End of Session Equipment Utilized During Treatment: Gait belt Activity Tolerance:  Patient tolerated treatment well General Behavior During Therapy: [facility] assessed/performed  GP    Lurena NidaAmy B Frazier, PTA/CLT 04/18/2013, 5:38 PM

## 2013-04-18 NOTE — Progress Notes (Signed)
Occupational Therapy Treatment Patient Details  Name: Bruce Guerrero MRN: [id number] Date of Birth: April 22, 1960  Today's Date: 04/18/2013 Time: 1522-1600 OT Time Calculation (min): 38 min Neuro Re-Ed 49'  Visit#: 16 of 36  Re-eval: 05/06/13    Authorization: BCBS  Authorization Time Period:    Authorization Visit#:   of    Subjective Symptoms/Limitations Symptoms: S: "I'm still a little sore back there." (tricep region) Pain Assessment Currently in Pain?: No/denies   Exercise/Treatments Neurological Re-education Exercises Elbow Flexion: Seated;10 reps (w tapping) Elbow Extension: Seated;10 reps (with tapping) Forearm Supination: Seated (with cone. eyes on left as supinates. stretch to full ROM.) Forearm Pronation: Seated (with cone. eyes on left as pronates.)  Weight Bearing Exercises Seated with weight on hand: Sat edge of mat. Pt pushed through elbow to maintain weightbearing. After approx 5' pt was able to maintain positioning..     Grasp and Release Grasp and Release: Thumb Opposition Thumb Opposition: Pt able to grasp pill bottle, but became frustrated upon trying to release. Was unable to grasp highlighter (too small). Pt able to grasp and release x1, 1/2 inch dowel.   Occupational Therapy Assessment and Plan OT Assessment and Plan Clinical Impression Statement: A: Pt demonstrated good elbow extension/flexion with tapping, but tired easily.  Supination was tight past neutral, but after stretching pt was able to attain AROM in supination to just past neutral. Pt had improved response aftering watching his nonaffeted left side do same movement. OT Plan: P: Continue NMR. Focus on grasp/release, supination/pronation. Using RUE as gross assist in functional tasks.   Goals Short Term Goals Short Term Goal 1: Pt will be educated on HEP. Short Term Goal 1 Progress: Progressing toward goal Short Term Goal 2: Pt will be educated on use and wear of hand splint to decrease  flexor tone. Short Term Goal 2 Progress: Met Short Term Goal 3: Pt will increase use of RUE to engage in AAROM in daily activities, 25% of the time. Short Term Goal 3 Progress: Met Short Term Goal 4: Pt will increase AROM in right shoulder to 25 degress of flexion to improve use of RUE in ADLs. Short Term Goal 4 Progress: Progressing toward goal Short Term Goal 5: Pt will don RLE AFO and shoe with modified independence Short Term Goal 5 Progress: Discontinued (comment) Short Term Goal 6: Pt will tolerate weightbearing on RUE for 1 minute to improve use of RUE in ADLs Short Term Goal 6 Progress: Met Short Term Goal 7: Pt will tolerate wear of hand splint for 6 hours per day, with no signs/symptoms of skin breakdown Short Term Goal 7 Progress: Met Long Term Goals Long Term Goal 1: Pt will increase use of RUE to engage in AROM in daily activities, 50% of the time. Long Term Goal 1 Progress: Progressing toward goal Long Term Goal 2: Pt will increase AROM in right shoulder to 45 degress of flexion to improve use of RUE in ADLs. Long Term Goal 2 Progress: Progressing toward goal Long Term Goal 3: Pt will tolerate weightbearing on RUE for 5 minutes to improve use of RUE in ADLs Long Term Goal 3 Progress: Met Long Term Goal 4: Pt will attain 3+/5 strength in RUE to engage in daily acticities w RUE. Long Term Goal 4 Progress: Progressing toward goal Long Term Goal 5: Pt will don/doff hand splint w modified independence. Long Term Goal 5 Progress: Met  Problem List Patient Active Problem List   Diagnosis Date Noted  . Difficulty  in walking(719.7) 03/08/2013  . Right leg weakness 03/08/2013  . Balance problems 03/08/2013  . Weakness of right upper extremity 03/08/2013  . Knee pain, right anterior 02/15/2013  . CVA (cerebral infarction) 02/01/2013  . Gout 08/05/2012  . HTN (hypertension) 08/05/2012    End of Session Activity Tolerance: Patient tolerated treatment well General Behavior  During Therapy: WFL for tasks assessed/performed  GO   Marie Rawlings, MS, OTR/L [phone]  04/18/2013, 4:26 PM 

## 2013-04-19 NOTE — Progress Notes (Signed)
Occupational Therapy Treatment Patient Details  Name: Bruce QuantWilliam S Pascucci MRN: [id number] Date of Birth: [birth year]  Today's Date: 03/28/2013 Time: 4098-11911433-1518 OT Time Calculation (min): 45 min  Neuro Muscular [phone] (25') TherActivity [id number] (20')  Visit#: 7 of 36  Re-eval:      Authorization: BCBS  Authorization Time Period:    Authorization Visit#:   of    Subjective Symptoms/Limitations Symptoms: S:  I am doing all that you give me for home... it just does seem to be coming  Pain Assessment Currently in Pain?: No/denies   Exercise/Treatments Neurological Re-education Exercises - supine PROM with active return to neutral for increasing proprioception and initiation of tricep and lower trap muscles as well as increased awareness of scapular movements in gravity eliminated plane.  Educated and instructed patient on supine shoulder flexion, protraction/retraction and horizontal ab/adductoin with RUE for home HEP.     Weight Bearing Exercises - transitional weightbearing seated edge of mat from seated to lateral weight shifting with RUE.  Improving tolerance of proprioception through extended RUE with decreased pain in wrist area with mod assistance to maintain position of hand on mat.  Scapular elevation/depression with full arm extended weight shifts  With min tactile cues for scapular retraction and improved positioning to prevent forward, IR posture.                Occupational Therapy Assessment and Plan OT Assessment and Plan Clinical Impression Statement: A:  Patient with improved complaint of pain in right wrist area with weight bearing.  Tolerated supine active initiation exercises to increase RUE range and proprioception  OT Plan: P:  increase SROM HEP   Goals Short Term Goals Short Term Goal 1: Pt will be educated on HEP. Short Term Goal 2: Pt will be educated on use and wear of hand splint to decrease flexor tone. Short Term Goal 3: Pt will increase use  of RUE to engage in AAROM in daily activities, 25% of the time. Short Term Goal 4: Pt will increase AROM in right shoulder to 25 degress of flexion to improve use of RUE in ADLs. Short Term Goal 5: Pt will don RLE AFO and shoe with modified independence Short Term Goal 6: Pt will tolerate weightbearing on RUE for 1 minute to improve use of RUE in ADLs Short Term Goal 7: Pt will tolerate wear of hand splint for 6 hours per day, with no signs/symptoms of skin breakdown Long Term Goals Long Term Goal 1: Pt will increase use of RUE to engage in AROM in daily activities, 50% of the time. Long Term Goal 2: Pt will increase AROM in right shoulder to 45 degress of flexion to improve use of RUE in ADLs. Long Term Goal 3: Pt will tolerate weightbearing on RUE for 5 minutes to improve use of RUE in ADLs Long Term Goal 4: Pt will attain 3+/5 strength in RUE to engage in daily acticities w RUE. Long Term Goal 5: Pt will don/doff hand splint w modified independence.  Problem List Patient Active Problem List   Diagnosis Date Noted  . Difficulty in walking(719.7) 03/08/2013  . Right leg weakness 03/08/2013  . Balance problems 03/08/2013  . Weakness of right upper extremity 03/08/2013  . Knee pain, right anterior 02/15/2013  . CVA (cerebral infarction) 02/01/2013  . Gout 08/05/2012  . HTN (hypertension) 08/05/2012    End of Session Activity Tolerance: Patient tolerated treatment well General Behavior During Therapy: [facility] for tasks assessed/performed  GO    Herbert SetaHeather  Konrad Dolores, OTR/L  03/28/2013, 4:56 PM

## 2013-04-19 NOTE — Progress Notes (Addendum)
Occupational Therapy Treatment Patient Details  Name: Bruce Guerrero S Kakar MRN: [id number] Date of Birth: 12-Dec-1960  Today's Date:    03/21/2013 Time: 4098-11911305-1345 OT Time Calculation (min): 40 min TherExercises 1305-1320 (15') Neuro Re-ed 1320-1345 (25')  Visit#: 4 of 36  Re-eval: 04/05/13    Authorization: BCBS  Authorization Time Period:    Authorization Visit#:   of    Subjective Symptoms/Limitations Symptoms: S: I have got to get this side working....  I need to get back to work Pain Assessment Currently in Pain?: No/denies  Precautions/Restrictions     Exercise/Treatments Neurological Re-education Exercises - shoulder PROM to 90 degrees flexion, elbow, wrist and hand PROM.  Educated patient on SROM for elbow flexion/extension, shoulder flexion/extension, forearm supination/pronation     Weight Bearing Exercises - tabletop weight bearing initially in standing with arm extended, transitioned to forearm secondary to wrist pain.           Occupational Therapy Assessment and Plan OT Assessment and Plan Clinical Impression Statement: A Patient demos improved proximal shoulder elevation, retraction and depression this date to increase postural alignment.  Patient with fair tolerance for weightbearing on forearm tabletop  as he reports increased pain in dorsal wrist with arm extended.   OT Plan: P:  Weightbearing   Goals Short Term Goals Short Term Goal 1: Pt will be educated on HEP. Short Term Goal 2: Pt will be educated on use and wear of hand splint to decrease flexor tone. Short Term Goal 3: Pt will increase use of RUE to engage in AAROM in daily activities, 25% of the time. Short Term Goal 4: Pt will increase AROM in right shoulder to 25 degress of flexion to improve use of RUE in ADLs. Short Term Goal 5: Pt will don RLE AFO and shoe with modified independence Short Term Goal 6: Pt will tolerate weightbearing on RUE for 1 minute to improve use of RUE in ADLs Short Term  Goal 7: Pt will tolerate wear of hand splint for 6 hours per day, with no signs/symptoms of skin breakdown Long Term Goals Long Term Goal 1: Pt will increase use of RUE to engage in AROM in daily activities, 50% of the time. Long Term Goal 2: Pt will increase AROM in right shoulder to 45 degress of flexion to improve use of RUE in ADLs. Long Term Goal 3: Pt will tolerate weightbearing on RUE for 5 minutes to improve use of RUE in ADLs Long Term Goal 4: Pt will attain 3+/5 strength in RUE to engage in daily acticities w RUE. Long Term Goal 5: Pt will don/doff hand splint w modified independence.  Problem List Patient Active Problem List   Diagnosis Date Noted  . Difficulty in walking(719.7) 03/08/2013  . Right leg weakness 03/08/2013  . Balance problems 03/08/2013  . Weakness of right upper extremity 03/08/2013  . Knee pain, right anterior 02/15/2013  . CVA (cerebral infarction) 02/01/2013  . Gout 08/05/2012  . HTN (hypertension) 08/05/2012    End of Session Activity Tolerance: Patient tolerated treatment well General Behavior During Therapy: [facility] for tasks assessed/performed  GO    Velora MediateHeather Aeryn Medici, OTR/L  03/21/2013, 10:03 PM

## 2013-04-19 NOTE — Progress Notes (Signed)
Occupational Therapy Treatment Patient Details  Name: Bruce Guerrero MRN: [id number] Date of Birth: 1960/12/16  Today's Date: 03/25/2013 Time: 4098-11911435-1520 OT Time Calculation (min): 45 min TherActivities 1435-1500 (25') Neuro Muscular Re-ed 1500-1520 (20')  Visit#: 6 of 36  Re-eval:      Authorization: BCBS  Authorization Time Period:    Authorization Visit#:   of    Subjective Symptoms/Limitations Symptoms: S:  I am using my walker against the wall to get more weight through this arm....  Pain Assessment Currently in Pain?: No/denies   Exercise/Treatments Neurological Re-education Exercises - seated protraction, retraction at table as well as supination/pronation with hand strapped to cone with moderate physical assistance and moderate difficulty. Instructed on protraction/retraction at tabletop for SROM HEP with good return demo.     Weight Bearing Exercises - continue weightbearing input tabletop to include forearm positioning shifting side to side for input through shoulder. Initiated tapping for tricep innervation for elbow extension/pushing through table for proprioceptive input.             Occupational Therapy Assessment and Plan OT Assessment and Plan Clinical Impression Statement: A:  Cont with weight bearing activities tabletop this date through forearm with improved propropception of shoulder positioning as well as noted increased SROM for protraction, retraction and positioning on walker with decreased shoulder elevation.  Patient reports he did not notice much difference from taping and reports he does feel any new movement in his tricep region   OT Plan: P:  increase SROM HEP   Goals Short Term Goals Short Term Goal 1: Pt will be educated on HEP. Short Term Goal 2: Pt will be educated on use and wear of hand splint to decrease flexor tone. Short Term Goal 3: Pt will increase use of RUE to engage in AAROM in daily activities, 25% of the time. Short Term Goal  4: Pt will increase AROM in right shoulder to 25 degress of flexion to improve use of RUE in ADLs. Short Term Goal 5: Pt will don RLE AFO and shoe with modified independence Short Term Goal 6: Pt will tolerate weightbearing on RUE for 1 minute to improve use of RUE in ADLs Short Term Goal 7: Pt will tolerate wear of hand splint for 6 hours per day, with no signs/symptoms of skin breakdown Long Term Goals Long Term Goal 1: Pt will increase use of RUE to engage in AROM in daily activities, 50% of the time. Long Term Goal 2: Pt will increase AROM in right shoulder to 45 degress of flexion to improve use of RUE in ADLs. Long Term Goal 3: Pt will tolerate weightbearing on RUE for 5 minutes to improve use of RUE in ADLs Long Term Goal 4: Pt will attain 3+/5 strength in RUE to engage in daily acticities w RUE. Long Term Goal 5: Pt will don/doff hand splint w modified independence.  Problem List Patient Active Problem List   Diagnosis Date Noted  . Difficulty in walking(719.7) 03/08/2013  . Right leg weakness 03/08/2013  . Balance problems 03/08/2013  . Weakness of right upper extremity 03/08/2013  . Knee pain, right anterior 02/15/2013  . CVA (cerebral infarction) 02/01/2013  . Gout 08/05/2012  . HTN (hypertension) 08/05/2012    End of Session Activity Tolerance: Patient tolerated treatment well General Behavior During Therapy: [facility] for tasks assessed/performed  GO    Velora MediateHeather Samyia Motter, OTR/L  03/25/2013, 5:27 PM

## 2013-04-19 NOTE — Progress Notes (Signed)
Occupational Therapy Treatment Patient Details  Name: Bruce Guerrero MRN: [id number] Date of Birth: [birth year]  Today's Date: 03/23/2013 Time: 4098-11911330-1424 OT Time Calculation (min): 54 min Neuro Muscular Re-edu 1330-1404 (34') TherActivities 4782-95621404-1424 (20')  Visit#: 5 of 36  Re-eval: 04/05/13    Authorization: BCBS  Authorization Time Period:    Authorization Visit#:   of    Subjective Symptoms/Limitations Symptoms: S:  I have been trying to get weight through this arm as much as I can....  Pain Assessment Currently in Pain?: No/denies   Exercise/Treatments Neurological Re-education Exercises - scapular retraction, depression exercises with tactile cues for improve right shoulder placement/alignment.    Weight Bearing Exercises  - seated Edge of Mat translation to>< from elbow through extension with RUE. Weight bearing on extended RUE on mat with hand on edge of mat while reaching cross body for saebo balls and placing in bin on far left side with LUE for increased proceptive input with RUE.     Development of Reach     Grasp and Release    Fine Motor Coordination          Occupational Therapy Assessment and Plan OT Assessment and Plan Clinical Impression Statement: A:  improved weightbearing tolerance this date.  increased focus on shoulder retraction and depression to prevent elevation compensatory movements.  kinesiotape applied to tricep area for proprioceptive input to assist with elbow extension.  patient educated on wear, precautions, removal and purpose of tape. verbalized good understanding.   OT Plan: P:  follow up taping, weightbearing.    Goals Short Term Goals Short Term Goal 1: Pt will be educated on HEP. Short Term Goal 2: Pt will be educated on use and wear of hand splint to decrease flexor tone. Short Term Goal 3: Pt will increase use of RUE to engage in AAROM in daily activities, 25% of the time. Short Term Goal 4: Pt will increase AROM in right  shoulder to 25 degress of flexion to improve use of RUE in ADLs. Short Term Goal 5: Pt will don RLE AFO and shoe with modified independence Short Term Goal 6: Pt will tolerate weightbearing on RUE for 1 minute to improve use of RUE in ADLs Short Term Goal 7: Pt will tolerate wear of hand splint for 6 hours per day, with no signs/symptoms of skin breakdown Long Term Goals Long Term Goal 1: Pt will increase use of RUE to engage in AROM in daily activities, 50% of the time. Long Term Goal 2: Pt will increase AROM in right shoulder to 45 degress of flexion to improve use of RUE in ADLs. Long Term Goal 3: Pt will tolerate weightbearing on RUE for 5 minutes to improve use of RUE in ADLs Long Term Goal 4: Pt will attain 3+/5 strength in RUE to engage in daily acticities w RUE. Long Term Goal 5: Pt will don/doff hand splint w modified independence.  Problem List Patient Active Problem List   Diagnosis Date Noted  . Difficulty in walking(719.7) 03/08/2013  . Right leg weakness 03/08/2013  . Balance problems 03/08/2013  . Weakness of right upper extremity 03/08/2013  . Knee pain, right anterior 02/15/2013  . CVA (cerebral infarction) 02/01/2013  . Gout 08/05/2012  . HTN (hypertension) 08/05/2012    End of Session Activity Tolerance: Patient tolerated treatment well General Behavior During Therapy: [facility] for tasks assessed/performed  GO    Velora MediateHeather Adiya Selmer, OTR/L  03/23/2013, 4:48 PM

## 2013-04-20 ENCOUNTER — Ambulatory Visit (HOSPITAL_COMMUNITY)
Admission: RE | Admit: 2013-04-20 | Discharge: 2013-04-20 | Disposition: A | Discharge status: Home / Self Care | Payer: BC Managed Care – PPO | Source: Ambulatory Visit | Attending: Family Medicine | Admitting: Family Medicine

## 2013-04-20 NOTE — Progress Notes (Signed)
Occupational Therapy Treatment Patient Details  Name: Bruce Guerrero MRN: [id number] Date of Birth: [birth year]  Today's Date: 04/04/2013 Time: 4098-11911436-1521 OT Time Calculation (min): 45 min Estim 1436-1451 (15') NeuroMuscular Re-Edu 1451-1521 (30')  Visit#: 10 of 36  Re-eval:      Authorization: BCBS  Authorization Time Period:    Authorization Visit#:   of    Subjective Symptoms/Limitations Symptoms: S:  I dont know that i can tell a real difference but it was only one time. Pain Assessment Currently in Pain?: No/denies   Exercise/Treatments Neurological Re-education Exercises - protraction/retraction, scapular depression, elbow  Flexion/extnsion    Weight Bearing Exercises - extended arm for digit extension, relaxation of tone, increased proprioception. Increased tactile input for elbow extension        Modalities Modalities: Archivistlectrical Stimulation Electrical Stimulation Electrical Stimulation Location: wrist extension/finger flexion Electrical Stimulation Action: Nurse, adultussian  Electrical Stimulation Parameters: Single, 10/10 cycle time, 100 BPS, 50%, 28 amps, 15 minutes Electrical Stimulation Goals: Neuromuscular facilitation  Occupational Therapy Assessment and Plan OT Assessment and Plan Clinical Impression Statement: A:  estim for wrist extension/digit flexion to assist with use of walker and gross grasp with no signs or symptoms of difficulty.  Patient with good tolerance and noted increased grasp followed by weight bearing for relaxation of digits into extension an dincreased proprioception through RUE. seated protraction/retraction with increased focus on scapular/shoulder positioning.  Patient reports increased tingling in distal RUE follwing treatment as well  as fatigue in RUE followng treatment this date.   OT Plan: P:  estim , proximal range, weightbearing.   Goals Short Term Goals Short Term Goal 1: Pt will be educated on HEP. Short Term Goal 2: Pt will be  educated on use and wear of hand splint to decrease flexor tone. Short Term Goal 3: Pt will increase use of RUE to engage in AAROM in daily activities, 25% of the time. Short Term Goal 4: Pt will increase AROM in right shoulder to 25 degress of flexion to improve use of RUE in ADLs. Short Term Goal 5: Pt will don RLE AFO and shoe with modified independence Short Term Goal 6: Pt will tolerate weightbearing on RUE for 1 minute to improve use of RUE in ADLs Short Term Goal 7: Pt will tolerate wear of hand splint for 6 hours per day, with no signs/symptoms of skin breakdown Long Term Goals Long Term Goal 1: Pt will increase use of RUE to engage in AROM in daily activities, 50% of the time. Long Term Goal 2: Pt will increase AROM in right shoulder to 45 degress of flexion to improve use of RUE in ADLs. Long Term Goal 3: Pt will tolerate weightbearing on RUE for 5 minutes to improve use of RUE in ADLs Long Term Goal 4: Pt will attain 3+/5 strength in RUE to engage in daily acticities w RUE. Long Term Goal 5: Pt will don/doff hand splint w modified independence.  Problem List Patient Active Problem List   Diagnosis Date Noted  . Difficulty in walking(719.7) 03/08/2013  . Right leg weakness 03/08/2013  . Balance problems 03/08/2013  . Weakness of right upper extremity 03/08/2013  . Knee pain, right anterior 02/15/2013  . CVA (cerebral infarction) 02/01/2013  . Gout 08/05/2012  . HTN (hypertension) 08/05/2012    End of Session Activity Tolerance: Patient tolerated treatment well General Behavior During Therapy: [facility] for tasks assessed/performed  GO    Velora MediateHeather Juda Lajeunesse, OTR/L  04/04/2013, 4:53PM

## 2013-04-20 NOTE — Progress Notes (Signed)
Physical Therapy Treatment Patient Details  Name: Bruce Guerrero MRN: [id number] Date of Birth: 17-May-1960  Today's Date: 04/20/2013 Time: 8295-62131430-1515 PT Time Calculation (min): 45 min Charges: NMR x 10' [phone](1432-1442) Therex x 30 [phone](1445-1515)  Visit#: 16 of 30  Re-eval: 05/08/13  Authorization: BCBS     Subjective: Pain Assessment Currently in Pain?: Yes Pain Score: 3  (only with shoulder movement) Pain Location: Shoulder Pain Orientation: Right Pain Type: Acute pain   Exercise/Treatments Standing Standing Eyes Opened: Solid surface;Narrow base of support (BOS);2 reps;30 secs;Limitations Standing Eyes Opened Limitations: Perturbations Heel Raises: 10 reps;Limitations Heel Raises Limitations: without HHA  Seated Other Seated Knee Exercises: Dorsiflexion with red tband 2x10 Supine Bridges: 10 reps;Limitations Bridges Limitations: 5" hold; cueing to improve right gluteal contraction Other Supine Knee Exercises: Isometric hip ext x 10 RLE Sidelying Hip ABduction: 15 reps Hip ABduction Limitations: With manual facilitation to stabilize hips Prone  Hamstring Curl: 10 reps Hamstring Curl Limitations: RLE with muscle tapping   Physical Therapy Assessment and Plan PT Assessment and Plan Clinical Impression Statement: Therapist facilitated activities to improve LE strength/stability and balance. Pt continues to respond well to muscle tapping for facilitation. Hamstring and quad strength continues to improve. Began dorsiflexion with tband to improve ankle strength. Also began isometric hip extension in supine to improve gluteal strength. Theses exercises were given to pt for HEP.  PT Plan: Continue to progress pt to goal of ambulating with least assistive device and increasing LE strength. Focus on improving muscle activation throughout RLE.      Problem List Patient Active Problem List   Diagnosis Date Noted  . Difficulty in walking(719.7) 03/08/2013  . Right leg weakness  03/08/2013  . Balance problems 03/08/2013  . Weakness of right upper extremity 03/08/2013  . Knee pain, right anterior 02/15/2013  . CVA (cerebral infarction) 02/01/2013  . Gout 08/05/2012  . HTN (hypertension) 08/05/2012    PT - End of Session Equipment Utilized During Treatment: Gait belt Activity Tolerance: Patient tolerated treatment well General Behavior During Therapy: [facility] tasks assessed/performed  Seth Bakeebekah Thomson Herbers, PTA 04/20/2013, 6:06 PM

## 2013-04-20 NOTE — Progress Notes (Signed)
Occupational Therapy Treatment Patient Details  Name: Bruce Guerrero MRN: [id number] Date of Birth: 29-Jan-1961  Today's Date:  04/01/2013 Time: 0865-78461432-1515 OT Time Calculation (min): 43 min Estim 1432-1452 (20') NeuroMuscular Re-Ed 1452-1515 (23')  Visit#: 9 of 36  Re-eval:      Authorization: BCBS  Authorization Time Period:    Authorization Visit#:   of    Subjective Symptoms/Limitations Symptoms: S:  I dont really feel anything (patient referring to tricep/posterior region of arm) Pain Assessment Currently in Pain?: No/denies     Exercise/Treatments Neurological Re-education Exercises - supine for shoulder flexion, extension; elbow flexion, extension; protraction/retraction x 10 reps each. Composite finger flexion PROM.  Active initiation for shoulder extension, elbow flexion/extension, protraction/retraction    Weight Bearing Exercises-  Tabletop for weight bearing on extended arm following estim treatment for increased elbow extension                   Electrical Stimulation Electrical Stimulation Location: tricep/posterior upper arm Electrical Stimulation Action: Doctor, hospitalussian Electrical Stimulation Parameters: Single, 10/10 cycle time, 100 BPS, 50%, 35 amps, 20 minutes Electrical Stimulation Goals: Neuromuscular facilitation  Occupational Therapy Assessment and Plan OT Assessment and Plan Clinical Impression Statement: A:  initiated stim this date for tricp contraction/elbow extension.  Weight bearing on tabletop to engage RUE in weight shift with increased focus on shoulder control/retraction and elbow extension.  patient with no signs or symptoms of difficulty with estim.   OT Plan: P:  estim , proximal range, weightbearing.   Goals Short Term Goals Short Term Goal 1: Pt will be educated on HEP. Short Term Goal 2: Pt will be educated on use and wear of hand splint to decrease flexor tone. Short Term Goal 3: Pt will increase use of RUE to engage in AAROM in  daily activities, 25% of the time. Short Term Goal 4: Pt will increase AROM in right shoulder to 25 degress of flexion to improve use of RUE in ADLs. Short Term Goal 5: Pt will don RLE AFO and shoe with modified independence Short Term Goal 6: Pt will tolerate weightbearing on RUE for 1 minute to improve use of RUE in ADLs Short Term Goal 7: Pt will tolerate wear of hand splint for 6 hours per day, with no signs/symptoms of skin breakdown Long Term Goals Long Term Goal 1: Pt will increase use of RUE to engage in AROM in daily activities, 50% of the time. Long Term Goal 2: Pt will increase AROM in right shoulder to 45 degress of flexion to improve use of RUE in ADLs. Long Term Goal 3: Pt will tolerate weightbearing on RUE for 5 minutes to improve use of RUE in ADLs Long Term Goal 4: Pt will attain 3+/5 strength in RUE to engage in daily acticities w RUE. Long Term Goal 5: Pt will don/doff hand splint w modified independence.  Problem List Patient Active Problem List   Diagnosis Date Noted  . Difficulty in walking(719.7) 03/08/2013  . Right leg weakness 03/08/2013  . Balance problems 03/08/2013  . Weakness of right upper extremity 03/08/2013  . Knee pain, right anterior 02/15/2013  . CVA (cerebral infarction) 02/01/2013  . Gout 08/05/2012  . HTN (hypertension) 08/05/2012    End of Session Activity Tolerance: Patient tolerated treatment well [facility] assessed/per[facility]  GO    Velora MediateHeather Anaria Kroner, OTR/L  04/01/2013, 4:28PM

## 2013-04-20 NOTE — Progress Notes (Signed)
Occupational Therapy Treatment Patient Details  Name: Bruce Guerrero MRN: [id number] Date of Birth: 1960/07/26  Today's Date: 04/20/2013 Time: 0328-0408 OT Time Calculation (min): 40 min Neuro Re-Ed (6')  Visit#: 17 of 36  Re-eval: 05/06/13    Authorization: BCBS  Authorization Time Period:    Authorization Visit#:   of    Subjective Symptoms/Limitations Symptoms: SL "My hands not swollen today." Pain Assessment Currently in Pain?: Yes Pain Score: 3  (only with shoulder movement) Pain Location: Shoulder Pain Orientation: Right Pain Type: Acute pain  Exercise/Treatments Neurological Re-education Exercises Elbow Flexion: Seated;10 reps Elbow Extension: Seated;10 reps Forearm Supination: Seated Forearm Pronation: Seated  Weight Bearing Exercises Seated with weight on hand: Weightbearing on table top in preperation for session   Grasp and Release Grasp and Release: Thumb Opposition Thumb Opposition: With e-stim, pt able to grasp and release cones. Facilitated reaching forward to stack cones.     Modalities Modalities: Field seismologist Location: Digit flexion and extension Electrical Stimulation Action: Architect Parameters: Reciprocal, 10/10 cycle, 100 BPS, 50%, 10 minutes. extension 44, flexion 36. Electrical Stimulation Goals: Neuromuscular facilitation Weight Bearing Technique Weight Bearing Technique: Yes RUE Weight Bearing Technique: Extended arm seated Response to Weight Bearing Technique: Relaxation of flexor tone in hand  Occupational Therapy Assessment and Plan OT Assessment and Plan Clinical Impression Statement: A: Pt remains with increased tightness in supination - likely due to increased activity throughout the day.  Pt tolerated well e-stim for digit extension/flexion. OT Plan: P: Continue NMR. Focus on grasp/release, supination/pronation. Using RUE as gross assist in  functional tasks (pick up pill bottle).   Goals Short Term Goals Short Term Goal 1: Pt will be educated on HEP. Short Term Goal 1 Progress: Progressing toward goal Short Term Goal 2: Pt will be educated on use and wear of hand splint to decrease flexor tone. Short Term Goal 2 Progress: Met Short Term Goal 3: Pt will increase use of RUE to engage in AAROM in daily activities, 25% of the time. Short Term Goal 3 Progress: Met Short Term Goal 4: Pt will increase AROM in right shoulder to 25 degress of flexion to improve use of RUE in ADLs. Short Term Goal 4 Progress: Progressing toward goal Short Term Goal 5: Pt will don RLE AFO and shoe with modified independence Short Term Goal 5 Progress: Discontinued (comment) Short Term Goal 6: Pt will tolerate weightbearing on RUE for 1 minute to improve use of RUE in ADLs Short Term Goal 6 Progress: Met Short Term Goal 7: Pt will tolerate wear of hand splint for 6 hours per day, with no signs/symptoms of skin breakdown Short Term Goal 7 Progress: Met Long Term Goals Long Term Goal 1: Pt will increase use of RUE to engage in AROM in daily activities, 50% of the time. Long Term Goal 1 Progress: Progressing toward goal Long Term Goal 2: Pt will increase AROM in right shoulder to 45 degress of flexion to improve use of RUE in ADLs. Long Term Goal 2 Progress: Progressing toward goal Long Term Goal 3: Pt will tolerate weightbearing on RUE for 5 minutes to improve use of RUE in ADLs Long Term Goal 3 Progress: Met Long Term Goal 4: Pt will attain 3+/5 strength in RUE to engage in daily acticities w RUE. Long Term Goal 4 Progress: Progressing toward goal Long Term Goal 5: Pt will don/doff hand splint w modified independence. Long Term Goal 5 Progress: Met  Problem List  Patient Active Problem List   Diagnosis Date Noted  . Difficulty in walking(719.7) 03/08/2013  . Right leg weakness 03/08/2013  . Balance problems 03/08/2013  . Weakness of right upper  extremity 03/08/2013  . Knee pain, right anterior 02/15/2013  . CVA (cerebral infarction) 02/01/2013  . Gout 08/05/2012  . HTN (hypertension) 08/05/2012    End of Session Activity Tolerance: Patient tolerated treatment well General Behavior During Therapy: [facility] for tasks assessed/performed  GO   Bea Graff, [place], OTR/L [phone] 04/20/2013, 4:24 PM

## 2013-04-20 NOTE — Progress Notes (Signed)
Occupational Therapy Treatment Patient Details  Name: Bruce Guerrero MRN: [id number] Date of Birth: 15-Jan-1961  Today's Date: 03/30/2013 Time: [id number] OT Time Calculation (min): 39 min Neuro muscular re-ed 1355-1420 (25') TherActivities 1420- 1434 (14')  Visit#: 8 of 36  Re-eval:      Authorization: BCBS  Authorization Time Period:    Authorization Visit#:   of    Subjective Symptoms/Limitations Symptoms: S:  I dont really feel like the back of my arm is doing anything to help me straighten my elbow.  Pain Assessment Currently in Pain?: No/denies      Exercise/Treatments Neurological Re-education Exercises - seated postural awareness for shoulder retraction/protractraction with decreased tactile and verbal cues     Weight Bearing Exercises - table top standing weightbearing reaching cross body for cones and stacking as well as return. Modified tabletop push for increased weight bearing and tricep innervation with RUE with mod assistance for support/stability at wrist/elbow.          Occupational Therapy Assessment and Plan OT Assessment and Plan Clinical Impression Statement: A:  patient cont to report decreased awareness of tricep contraction and report cont increaed SROM at home as well as cont weight bearing through walker with some reported increased control and strength.  OT Plan: P: estim to RUE for tricep awareness, contraction    Goals    Problem List Patient Active Problem List   Diagnosis Date Noted  . Difficulty in walking(719.7) 03/08/2013  . Right leg weakness 03/08/2013  . Balance problems 03/08/2013  . Weakness of right upper extremity 03/08/2013  . Knee pain, right anterior 02/15/2013  . CVA (cerebral infarction) 02/01/2013  . Gout 08/05/2012  . HTN (hypertension) 08/05/2012       GO    Bruce Guerrero, OTR/L  03/30/2013, 4:11PM

## 2013-04-21 ENCOUNTER — Other Ambulatory Visit: Payer: Self-pay | Admitting: Family Medicine

## 2013-04-22 ENCOUNTER — Ambulatory Visit (HOSPITAL_COMMUNITY)
Admission: RE | Admit: 2013-04-22 | Discharge: 2013-04-22 | Disposition: A | Discharge status: Home / Self Care | Payer: BC Managed Care – PPO | Source: Ambulatory Visit | Attending: Family Medicine | Admitting: Family Medicine

## 2013-04-22 DIAGNOSIS — R2689 Other abnormalities of gait and mobility: Secondary | ICD-10-CM

## 2013-04-22 DIAGNOSIS — R262 Difficulty in walking, not elsewhere classified: Secondary | ICD-10-CM

## 2013-04-22 DIAGNOSIS — R29898 Other symptoms and signs involving the musculoskeletal system: Secondary | ICD-10-CM

## 2013-04-22 NOTE — Telephone Encounter (Signed)
Last seen 04/08/13, last filled 03/24/13, Route to pool A, call into [facility]

## 2013-04-22 NOTE — Progress Notes (Signed)
Occupational Therapy Treatment Patient Details  Name: Bruce Guerrero MRN: [id number] Date of Birth: 1961/02/02  Today's Date: 04/22/2013 Time: 1522-1604 OT Time Calculation (min): 42 min Neuro Re-Ed (46')  Visit#: 18 of    Re-eval: 05/06/13    Authorization: BCBS  Authorization Time Period:    Authorization Visit#:   of    Subjective Symptoms/Limitations Symptoms: S: "I've got a question - when i lay down at night and bring my arm across my chest, it gets sore." (referring to anterior rotator cuff region) Pain Assessment Currently in Pain?: No/denies   Exercise/Treatments Neurological Re-education Weight Bearing Exercises Seated with weight on hand: Weightbearing at edge of mat through flattened hand with elbow extended. while reaching with left hand, pt shifted weight through right hand, elbow, shoulder, leg, and foot.   Grasp and Release Grasp and Release: Thumb Opposition Thumb Opposition: with tapping and weightbearing between grasps, pt able to reach forward to grasp .5 inch dowel and release into left hand x6 repititions. Able to grasp and release x2 in a row without weightbearing in between, but required more tapping/time to release. Pt reminded to relax and watch left hand opening while attempting right   Occupational Therapy Assessment and Plan OT Assessment and Plan Clinical Impression Statement: A: Pt tolerated well tapping for thumb extension this date - able to grasp .5 inch dowel and release. Pt to remember to simply relax right hand, rather than focus and tightening. OT Plan: P: Continue NMR. Focus on grasp/release, supination/pronation. Using RUE as gross assist in functional tasks (pick up pill bottle, push in chair, carry objects with arm).   Goals Short Term Goals Short Term Goal 1: Pt will be educated on HEP. Short Term Goal 1 Progress: Progressing toward goal Short Term Goal 2: Pt will be educated on use and wear of hand splint to decrease flexor  tone. Short Term Goal 2 Progress: Met Short Term Goal 3: Pt will increase use of RUE to engage in AAROM in daily activities, 25% of the time. Short Term Goal 3 Progress: Met Short Term Goal 4: Pt will increase AROM in right shoulder to 25 degress of flexion to improve use of RUE in ADLs. Short Term Goal 4 Progress: Progressing toward goal Short Term Goal 5: Pt will don RLE AFO and shoe with modified independence Short Term Goal 5 Progress: Discontinued (comment) Short Term Goal 6: Pt will tolerate weightbearing on RUE for 1 minute to improve use of RUE in ADLs Short Term Goal 6 Progress: Met Short Term Goal 7: Pt will tolerate wear of hand splint for 6 hours per day, with no signs/symptoms of skin breakdown Short Term Goal 7 Progress: Met Long Term Goals Long Term Goal 1: Pt will increase use of RUE to engage in AROM in daily activities, 50% of the time. Long Term Goal 1 Progress: Progressing toward goal Long Term Goal 2: Pt will increase AROM in right shoulder to 45 degress of flexion to improve use of RUE in ADLs. Long Term Goal 2 Progress: Progressing toward goal Long Term Goal 3 Progress: Met Long Term Goal 4: Pt will attain 3+/5 strength in RUE to engage in daily acticities w RUE. Long Term Goal 4 Progress: Progressing toward goal Long Term Goal 5: Pt will don/doff hand splint w modified independence. Long Term Goal 5 Progress: Met  Problem List Patient Active Problem List   Diagnosis Date Noted  . Difficulty in walking(719.7) 03/08/2013  . Right leg weakness 03/08/2013  . Balance  problems 03/08/2013  . Weakness of right upper extremity 03/08/2013  . Knee pain, right anterior 02/15/2013  . CVA (cerebral infarction) 02/01/2013  . Gout 08/05/2012  . HTN (hypertension) 08/05/2012    End of Session Activity Tolerance: Patient tolerated treatment well General Behavior During Therapy: [facility] for tasks assessed/performed  GO    Bea Graff, [place], OTR/L [phone]   04/22/2013, 4:22 PM

## 2013-04-22 NOTE — Progress Notes (Addendum)
Physical Therapy Treatment Patient Details  Name: Bruce Guerrero S Bake MRN: [id number] Date of Birth: 1960/07/09  Today's Date: 04/22/2013 Time: 9562-13081430-1517 PT Time Calculation (min): 47 min There ex 1430-1445; neuroreed [id number] Visit#: 17 of 30  Re-eval: 05/08/13   Authorization: BCBS   Subjective: Symptoms/Limitations Symptoms: Pt states he is still having trouble controlling the bending of his knee. Pain Assessment Currently in Pain?: No/denies  Exercise/Treatments   Aerobic Stationary Bike: nustep L 4 hills x10:00   Standing Forward Lunges: 10 reps Side Lunges: 10 reps Functional Squat: 10 reps;Limitations Functional Squat Limitations: deep SLS: x5  Other Standing Knee Exercises: Placed four marks on floor-pt had to tap mark one per second. 5 x clockwise; 5 x counterclockwise. Other Standing Knee Exercises: stand with Rt LE on 8" step; Lt on 8" step; tandem stance B; Marching in place x 10;  Seated Other Seated Knee Exercises: sit to stand no UE x 10 slow Supine Bridges: 10 reps Other Supine Knee Exercises: isometric hip ext x 10    Physical Therapy Assessment and Plan PT Assessment and Plan Clinical Impression Statement: Therapist facilitiate exercises to ensure equal weightbearing on both Rt and Lt LE.  Pt continues to gain strength and control of Rt LE.  Added LE coordination activitiy  PT Plan: Focus on mm control and activation especially of hamstring mm.    Goals   Progressing. Problem List Patient Active Problem List   Diagnosis Date Noted  . Difficulty in walking(719.7) 03/08/2013  . Right leg weakness 03/08/2013  . Balance problems 03/08/2013  . Weakness of right upper extremity 03/08/2013  . Knee pain, right anterior 02/15/2013  . CVA (cerebral infarction) 02/01/2013  . Gout 08/05/2012  . HTN (hypertension) 08/05/2012    General Behavior During Therapy: [facility] for tasks assessed/performed  GP    RUSSELL,CINDY 04/22/2013, 5:33 PM

## 2013-04-25 ENCOUNTER — Inpatient Hospital Stay (HOSPITAL_COMMUNITY): Admission: RE | Admit: 2013-04-25 | Payer: BC Managed Care – PPO | Source: Ambulatory Visit

## 2013-04-25 ENCOUNTER — Telehealth (HOSPITAL_COMMUNITY): Payer: Self-pay

## 2013-04-27 ENCOUNTER — Ambulatory Visit (HOSPITAL_COMMUNITY): Payer: BC Managed Care – PPO | Admitting: Specialist

## 2013-04-28 ENCOUNTER — Ambulatory Visit (HOSPITAL_COMMUNITY)
Admission: RE | Admit: 2013-04-28 | Discharge: 2013-04-28 | Disposition: A | Discharge status: Home / Self Care | Payer: BC Managed Care – PPO | Source: Ambulatory Visit | Attending: Family Medicine | Admitting: Family Medicine

## 2013-04-28 DIAGNOSIS — R262 Difficulty in walking, not elsewhere classified: Secondary | ICD-10-CM

## 2013-04-28 DIAGNOSIS — R29898 Other symptoms and signs involving the musculoskeletal system: Secondary | ICD-10-CM

## 2013-04-28 DIAGNOSIS — R2689 Other abnormalities of gait and mobility: Secondary | ICD-10-CM

## 2013-04-28 NOTE — Progress Notes (Signed)
Physical Therapy Treatment Patient Details  Name: Bruce Guerrero MRN: [id number] Date of Birth: 1960/04/30  Today's Date: 04/28/2013 Time: 9147-82951350-1432 PT Time Calculation (min): 42 min Charge Therex 42' [id number]  Visit#: 18 of 30  Re-eval: 05/08/13 Assessment Diagnosis: CVA  Next MD Visit: Bruce Guerrero 05/23/2013 Prior Therapy: [facility].  Authorization: BCBS   Authorization Time Period:    Authorization Visit#:   of     Subjective: Symptoms/Limitations Symptoms: Pt reported increase ease getting around the house, most difficulty continues to be working his Rt knee.   Pain Assessment Currently in Pain?: No/denies  Precautions/Restrictions  Precautions Precautions: Fall  Exercise/Treatments Aerobic Stationary Bike: nustep L 4 hills x10:00 Standing Forward Lunges: Both;15 reps Side Lunges: Both;15 reps Functional Squat: Limitations;20 reps Functional Squat Limitations: deep Other Standing Knee Exercises: Placed four marks on floor-pt had to tap mark one per second. 5 x clockwise; 5 x counterclockwise. Other Standing Knee Exercises: stand with Rt LE on 8" step; Lt on 8" step; tandem stance B; Marching in place x 10;  Sidelying Other Sidelying Knee Exercises: sliding board for knee flexion and hip extensino with tactile cueing for NMR 15x Prone  Hamstring Curl: 10 reps Hamstring Curl Limitations: RLE wiht muscle tapping      Physical Therapy Assessment and Plan PT Assessment and Plan Clinical Impression Statement: Session focus on improving hamstring activation with functional activities to improve gait mechanics.  Therapist faciliation to ensure equal weightbearing Bil LE with gait.  Continued with coordination activities. PT Plan: Focus on mm control and activation especially of hamstring mm.    Goals    Problem List Patient Active Problem List   Diagnosis Date Noted  . Difficulty in walking(719.7) 03/08/2013  . Right leg weakness 03/08/2013  . Balance  problems 03/08/2013  . Weakness of right upper extremity 03/08/2013  . Knee pain, right anterior 02/15/2013  . CVA (cerebral infarction) 02/01/2013  . Gout 08/05/2012  . HTN (hypertension) 08/05/2012    PT - End of Session Equipment Utilized During Treatment: Gait belt Activity Tolerance: Patient tolerated treatment well General Behavior During Therapy: [facility] for tasks assessed/performed  GP    Bruce Guerrero 04/28/2013, 4:25 PM

## 2013-04-29 ENCOUNTER — Ambulatory Visit (HOSPITAL_COMMUNITY)
Admission: RE | Admit: 2013-04-29 | Discharge: 2013-04-29 | Disposition: A | Discharge status: Home / Self Care | Payer: BC Managed Care – PPO | Source: Ambulatory Visit | Attending: Family Medicine | Admitting: Family Medicine

## 2013-04-29 DIAGNOSIS — R29898 Other symptoms and signs involving the musculoskeletal system: Secondary | ICD-10-CM

## 2013-04-29 DIAGNOSIS — R262 Difficulty in walking, not elsewhere classified: Secondary | ICD-10-CM

## 2013-04-29 DIAGNOSIS — R2689 Other abnormalities of gait and mobility: Secondary | ICD-10-CM

## 2013-04-29 NOTE — Progress Notes (Signed)
Occupational Therapy Treatment Patient Details  Name: Bruce Guerrero MRN: [id number] Date of Birth: 1961/02/22  Today's Date: 04/29/2013 Time: 4854-6270 OT Time Calculation (min): 43 min Neuro reed 43' Visit#: 19 of 36  Re-eval: 05/06/13    Authorization: BCBS    Subjective  S:  I feel like I used to have more movement in my arm, of course my shoulder wasnt hurting as much then.  (discussed physiologic reasons for pain he is feeling in shoulder, educated him to continue postural shoulder exercises to improve alignment and to continue weightbearing.  Pain Assessment Currently in Pain?: Yes Pain Score: 3  Pain Location: Shoulder Pain Orientation: Right Pain Type: Acute pain  Precautions/Restrictions     Exercise/Treatments Shoulder  Seated Elevation: AROM;15 reps (with weight eliminated arm 5 additional with lean to right) Neurological Re-education Exercises Shoulder Protraction: Supine;AAROM;10 reps Shoulder Horizontal ABduction: Supine;AAROM;10 reps Shoulder External Rotation: Supine;AAROM;10 reps Shoulder Internal Rotation: Supine;AROM;10 reps Elbow Flexion: Supine;AROM;AAROM;10 reps Elbow Extension: Supine;AROM;AAROM;10 reps Forearm Supination: Supine;AAROM;10 reps Forearm Pronation: Supine;AAROM;10 reps Wrist Flexion: Supine;AAROM;10 reps Wrist Extension: Supine;AAROM;10 reps  Weight Bearing Exercises Seated with weight on hand: weightbearing seated with elbow extended and wrist extended while completing weight shifting on and off right arm with min facilitation for positioning  X 25.  Completed Saebo ring tree with left arm while weightbearing on right  Development of Reach  Development of Reach: Haynes Hoehn Ring Tree: with mod hand over hand assist grasped saebo ring and reached forward with mod facilitation to place ring on tree  X 2, then grasped Supination ball on peg vs ball, maintaining grasp on peg independently he was able to reach forward and place on  Saebo tree with mod facilitaiton X 2  Grasp and Release Grasp and Release:  (on saebo balls with mod pa to grasp and max to release)   Occupational Therapy Assessment and Plan OT Assessment and Plan Clinical Impression Statement: A:  Able to actively extend elbow against gravity, lacking last 20 degrees which he is able to accomplish with min facilitation.  Encouraged him to practice this lacking range at home with elbow resting on table.  Requires max vg to breathe during exercises. With weightbearing and massage to right shoulder pain with active flexion decreased. OT Plan: P:  Bring together segmented reach to one fluid movement, tape shoulder for decreased pain.  Use RUE as a gross assist with functional activities   Goals Home Exercise Program Pt/caregiver will Perform Home Exercise Program: For increased strengthening PT Goal: Perform Home Exercise Program - Progress: Progressing toward goal Short Term Goals Short Term Goal 1: Pt will be educated on HEP. Short Term Goal 1 Progress: Met Short Term Goal 2: Pt will be educated on use and wear of hand splint to decrease flexor tone. Short Term Goal 3: Pt will increase use of RUE to engage in AAROM in daily activities, 25% of the time. Short Term Goal 4: Pt will increase AROM in right shoulder to 25 degress of flexion to improve use of RUE in ADLs. Short Term Goal 4 Progress: Progressing toward goal Short Term Goal 5: Pt will don RLE AFO and shoe with modified independence Short Term Goal 6: Pt will tolerate weightbearing on RUE for 1 minute to improve use of RUE in ADLs Short Term Goal 7: Pt will tolerate wear of hand splint for 6 hours per day, with no signs/symptoms of skin breakdown Long Term Goals Long Term Goal 1: Pt will increase use of RUE  to engage in AROM in daily activities, 50% of the time. Long Term Goal 1 Progress: Progressing toward goal Long Term Goal 2: Pt will increase AROM in right shoulder to 45 degress of flexion to  improve use of RUE in ADLs. Long Term Goal 2 Progress: Progressing toward goal Long Term Goal 3: Pt will tolerate weightbearing on RUE for 5 minutes to improve use of RUE in ADLs Long Term Goal 4: Pt will attain 3+/5 strength in RUE to engage in daily acticities w RUE. Long Term Goal 4 Progress: Progressing toward goal Long Term Goal 5: Pt will don/doff hand splint w modified independence.  Problem List Patient Active Problem List   Diagnosis Date Noted  . Difficulty in walking(719.7) 03/08/2013  . Right leg weakness 03/08/2013  . Balance problems 03/08/2013  . Weakness of right upper extremity 03/08/2013  . Knee pain, right anterior 02/15/2013  . CVA (cerebral infarction) 02/01/2013  . Gout 08/05/2012  . HTN (hypertension) 08/05/2012    End of Session Activity Tolerance: Patient tolerated treatment well General Behavior During Therapy: [facility] for tasks assessed/performed  GO    Vangie Bicker, OTR/L  04/29/2013, 5:04 PM

## 2013-04-29 NOTE — Progress Notes (Signed)
Physical Therapy Treatment Patient Details  Name: Bruce Guerrero MRN: [id number] Date of Birth: December 27, 1960  Today's Date: 04/29/2013 Time: 6301-6010 PT Time Calculation (min): 52 min Charge:  Gt 9323-5573; there ex 2202-5427.  Visit#: 19 of 30  Re-eval: 05/08/13   Authorization: BCBS   Subjective: Symptoms/Limitations Symptoms: Pt states that he has not been using his wallker in his house except at night when he is getting tired.  Pt states when he gets tired his leg tends to feel as if it is going to give way on him.  Pain Assessment Currently in Pain?: No/denies  Exercise/Treatments  Aerobic Stationary Bike: nustep L5  x 7:00 Machines for Strengthening Cybex Knee Extension: 1 PL Rt LE only x 10 Cybex Knee Flexion: 1 PL Rt LE takes down 80% with AA from Lt for the last 20% x 10 Cybex Leg Press: 4 PL Rt only x 10   Standing Heel Raises: 10 reps (holding for 3 seconds ) Forward Lunges: Both;15 reps Side Lunges: Both;15 reps Other Standing Knee Exercises: ambulate with cane concentrating on equal stride length and improving velocity x 8 minutes. Other Standing Knee Exercises: standing at table working on heelstrike rolling through to heel lift with knee flextion. Seated Other Seated Knee Exercises: sit to stand slow both up and down x 10     Physical Therapy Assessment and Plan PT Assessment and Plan Clinical Impression Statement: Pt began machines for LE strengthening.  Pt worked on Personnel officer working on proper heelstrike thru heellift.  Pt fatigues easily urged pt to put the kitchen timer on for five minutes and walk the entire time wihout sitting down three times a day; once this is easy increase to six minutes then seven working until he is able to ambulate for 15 minutes straight without diffculty. PT Frequency: Min 3X/week PT Plan: Focus on mm control and activation especially of hamstring mm. Increase to 5 plates on cybex leg press.    Goals Home Exercise  Program Pt/caregiver will Perform Home Exercise Program: For increased strengthening PT Goal: Perform Home Exercise Program - Progress: Progressing toward goal PT Short Term Goals Time to Complete Short Term Goals: 3 weeks PT Short Term Goal 1: Pt to be able to stand for 20 minutes to socialize PT Short Term Goal 1 - Progress: Not met PT Short Term Goal 2: Pt to be able to walk with QC inside x 15 minutes. PT Short Term Goal 2 - Progress: Progressing toward goal PT Short Term Goal 3: Pt strength to be increased 1/2 grade to allow the above to occur. PT Short Term Goal 3 - Progress: Progressing toward goal PT Short Term Goal 4: Pt Berg to be increased 7 pts to improve safety of ambulation. PT Short Term Goal 4 - Progress: Met PT Long Term Goals PT Long Term Goal 1: Pt to be I in advance HEP PT Long Term Goal 1 - Progress: Met PT Long Term Goal 2: PT to be able to walk outside with cane x 30 mintues PT Long Term Goal 2 - Progress: Not met Long Term Goal 3: Pt to be able to walk inside without an assistive device Long Term Goal 3 Progress: Progressing toward goal Long Term Goal 4: Pt to berg score to increase by 14 points to improve safety of ambulation Long Term Goal 4 Progress: Progressing toward goal  Problem List Patient Active Problem List   Diagnosis Date Noted  . Difficulty in walking(719.7) 03/08/2013  . Right leg  weakness 03/08/2013  . Balance problems 03/08/2013  . Weakness of right upper extremity 03/08/2013  . Knee pain, right anterior 02/15/2013  . CVA (cerebral infarction) 02/01/2013  . Gout 08/05/2012  . HTN (hypertension) 08/05/2012    PT - End of Session Equipment Utilized During Treatment: Gait belt Activity Tolerance: Patient tolerated treatment well General Behavior During Therapy: [facility] for tasks assessed/performed  GP    RUSSELL,CINDY 04/29/2013, 4:05 PM

## 2013-05-02 ENCOUNTER — Ambulatory Visit (HOSPITAL_COMMUNITY)
Admission: RE | Admit: 2013-05-02 | Discharge: 2013-05-02 | Disposition: A | Discharge status: Home / Self Care | Payer: BC Managed Care – PPO | Source: Ambulatory Visit | Attending: Family Medicine | Admitting: Family Medicine

## 2013-05-02 NOTE — Progress Notes (Signed)
Physical Therapy Treatment Patient Details  Name: Bruce Guerrero MRN: [id number] Date of Birth: [birth year]  Today's Date: 05/02/2013 Time: 4098-11911430-1513 PT Time Calculation (min): 43 min Charges: Therex x 30'(1430-1500) Gait x [phone]'(1503-1513)  Visit#: 20 of 30  Re-eval: 05/08/13  Authorization: BCBS     Subjective: Pain Assessment Currently in Pain?: Yes Pain Score: 4  Pain Location: Knee Pain Orientation: Right;Left   Exercise/Treatments Machines for Strengthening Cybex Knee Extension: 1 PL Rt LE only x 10 Cybex Knee Flexion: 1.5 PL Rt LE x 10 Cybex Leg Press: 4 PL Rt only x 10 Standing Heel Raises: 10 reps;Limitations Heel Raises Limitations: 10" holds Knee Flexion: 10 reps;Limitations Knee Flexion Limitations: With multimodal cueing to avoid hip flexion Rocker Board: 2 minutes Rocker Board Limitations: R/L with multimodal cueing for control Gait Training: With SPC indoors working on equalizing weight shift and increasing right knee flexion Seated Other Seated Knee Exercises: sit to stand without UE assistance x 10   Physical Therapy Assessment and Plan PT Assessment and Plan Clinical Impression Statement: Pt continues to progress well overall. Pt displays improve hamstring strength against gravity. Pt continues to struggle with knee flexion with gait. This appears to be partly due to decreased ankle strength. Pt displays decreased plantarflexion with toe-off. Pt displays improved stability and more equalized weight shift with gait. PT Frequency: Min 3X/week PT Plan: Continue to progress LE strength with focus on hamstring and gluteal musculature. Continue to improve gait mechanics and balance.    Problem List Patient Active Problem List   Diagnosis Date Noted  . Difficulty in walking(719.7) 03/08/2013  . Right leg weakness 03/08/2013  . Balance problems 03/08/2013  . Weakness of right upper extremity 03/08/2013  . Knee pain, right anterior 02/15/2013  . CVA  (cerebral infarction) 02/01/2013  . Gout 08/05/2012  . HTN (hypertension) 08/05/2012    PT - End of Session Equipment Utilized During Treatment: Gait belt Activity Tolerance: Patient tolerated treatment well General Behavior During Therapy: [facility] for tasks assessed/performed   Seth Bakeebekah Yari Szeliga, PTA  05/02/2013, 3:37 PM

## 2013-05-02 NOTE — Progress Notes (Signed)
Occupational Therapy Treatment Patient Details  Name: Bruce Guerrero MRN: [id number] Date of Birth: [birth year]  Today's Date: 05/02/2013 Time: 2130-8657 OT Time Calculation (min): 51 min Neuromuscular Re-Ed 74'  Visit#: 20 of 36  Re-eval: 05/06/13    Authorization: BCBS  Authorization Time Period:    Authorization Visit#:   of    Subjective Symptoms/Limitations Symptoms: S: I'm aching my my shoulder - I think it feels like a muscle ache, but i'm not really sure." Pain Assessment Currently in Pain?: Yes Pain Score: 4  Pain Location: Knee Pain Orientation: Right;Left  Exercise/Treatments Neurological Re-education Weight Bearing Exercises Seated with weight on hand: weightbearing seating with elbow and wrist extended - learning forward and left/right for weightshifting on RUE while picking up/placing saebo stick-balls x5 x3reps.  Development of Reach  Development of Reach: Saebo;Hand over hand assistance;Reaching Hand over Hand Assistance while Reaching: Pt able to reach forward towards saebo ball, required assist to initiate grasp peg on ball (maintained grasp with 2 drops), bring ball towards self, and with tapping extend elbow and wrist and flex shoulder to place ball peg back into hole. Reaching to Waist: in sidelying with powder board, pt extended shoulder and elbow to push cone down towards waist, and flexed elbow and shoulder to bring cone back towards face height. x5 reps with AAROM and tapping   Occupational Therapy Assessment and Plan OT Assessment and Plan Clinical Impression Statement: A: with cueing and tapping, pt able to extend shoulder and elbow with min faciliation for last 20 degrees of elbow extension. Pt mentioned increased shoulder pain this date - pt indicated he has recently had more difficulty and pain with moving shoulder into flexion. Pt has pain in flexion at approximatley 90 degrees and pain with abduction at approximately 45 degrees (while in  sitting).  OT Plan: P: continue working towards bringing together RUE movements into one fluid movement. Taping shoulder for decreased pain. PROM of shoulder.  Using [place] as gross assist with functional activities.   Goals Short Term Goals Short Term Goal 1: Pt will be educated on HEP. Short Term Goal 1 Progress: Met Short Term Goal 2: Pt will be educated on use and wear of hand splint to decrease flexor tone. Short Term Goal 2 Progress: Met Short Term Goal 3: Pt will increase use of RUE to engage in AAROM in daily activities, 25% of the time. Short Term Goal 3 Progress: Met Short Term Goal 4: Pt will increase AROM in right shoulder to 25 degress of flexion to improve use of RUE in ADLs. Short Term Goal 4 Progress: Progressing toward goal Short Term Goal 5: Pt will don RLE AFO and shoe with modified independence Short Term Goal 5 Progress: Discontinued (comment) Short Term Goal 6: Pt will tolerate weightbearing on RUE for 1 minute to improve use of RUE in ADLs Short Term Goal 6 Progress: Met Short Term Goal 7: Pt will tolerate wear of hand splint for 6 hours per day, with no signs/symptoms of skin breakdown Short Term Goal 7 Progress: Met Long Term Goals Long Term Goal 1: Pt will increase use of RUE to engage in AROM in daily activities, 50% of the time. Long Term Goal 1 Progress: Progressing toward goal Long Term Goal 2: Pt will increase AROM in right shoulder to 45 degress of flexion to improve use of RUE in ADLs. Long Term Goal 2 Progress: Progressing toward goal Long Term Goal 3: Pt will tolerate weightbearing on RUE for 5 minutes to improve  use of RUE in ADLs Long Term Goal 3 Progress: Met Long Term Goal 4: Pt will attain 3+/5 strength in RUE to engage in daily acticities w RUE. Long Term Goal 4 Progress: Progressing toward goal Long Term Goal 5: Pt will don/doff hand splint w modified independence. Long Term Goal 5 Progress: Met  Problem List Patient Active Problem List    Diagnosis Date Noted  . Difficulty in walking(719.7) 03/08/2013  . Right leg weakness 03/08/2013  . Balance problems 03/08/2013  . Weakness of right upper extremity 03/08/2013  . Knee pain, right anterior 02/15/2013  . CVA (cerebral infarction) 02/01/2013  . Gout 08/05/2012  . HTN (hypertension) 08/05/2012    End of Session Activity Tolerance: Patient tolerated treatment well General Behavior During Therapy: [facility] for tasks assessed/performed  GO   Bea Graff, MS, OTR/L [phone]  05/02/2013, 4:25 PM

## 2013-05-04 ENCOUNTER — Ambulatory Visit (HOSPITAL_COMMUNITY)
Admission: RE | Admit: 2013-05-04 | Discharge: 2013-05-04 | Disposition: A | Discharge status: Home / Self Care | Payer: BC Managed Care – PPO | Source: Ambulatory Visit | Attending: Physical Therapy | Admitting: Physical Therapy

## 2013-05-04 DIAGNOSIS — R262 Difficulty in walking, not elsewhere classified: Secondary | ICD-10-CM

## 2013-05-04 DIAGNOSIS — R2689 Other abnormalities of gait and mobility: Secondary | ICD-10-CM

## 2013-05-04 DIAGNOSIS — R29898 Other symptoms and signs involving the musculoskeletal system: Secondary | ICD-10-CM

## 2013-05-04 NOTE — Progress Notes (Signed)
Physical Therapy Treatment Patient Details  Name: Bruce Guerrero MRN: [id number] Date of Birth: Jun 06, 1960  Today's Date: 05/04/2013 Time: 1430-1520 PT Time Calculation (min): 50 min Charge:  There ex [id number]; neuro reed 1455-155. Visit#: 21 of 30  Re-eval: 05/08/13    Authorization: BCBS   Authorization Time Period:    Authorization Visit#:   of     Subjective: Symptoms/Limitations Symptoms: I have been practicing my walking Pain Assessment Currently in Pain?: No/denies   Exercise/Treatments   Aerobic Stationary Bike: nustep L 5 x 10:00 Machines for Strengthening Cybex Knee Extension: 1 PL Rt LE only x 10 Cybex Knee Flexion: 2.0 PL Rt LE x 10 Cybex Leg Press: 4 PL Rt only x 10   Standing Heel Raises: 10 reps;Limitations Heel Raises Limitations: 10" holds Functional Squat: 10 reps Lunge Walking - Round Trips: 1/2 RT Gait Training: With SPC indoors working on equalizing weight shift and increasing right knee flexion Other Standing Knee Exercises: tandem stance x 30" x 2 Other Standing Knee Exercises: marching x 10; Seated Other Seated Knee Exercises: dorsiflexion with 2 #  Other Seated Knee Exercises: sit to stand without UE assistance x 10   Physical Therapy Assessment and Plan PT Assessment and Plan Clinical Impression Statement: added wt to hamstring curl on cybex machine, lunge walking and resisted DF while sitting,(attempted DF while standing but pt was unable to complete this task).  Pt gait is improved PT Plan: Continue to progress LE strength with focus on hamstring and gluteal musculature. Continue to improve gait mechanics and balance.    Goals  progressing  Problem List Patient Active Problem List   Diagnosis Date Noted  . Difficulty in walking(719.7) 03/08/2013  . Right leg weakness 03/08/2013  . Balance problems 03/08/2013  . Weakness of right upper extremity 03/08/2013  . Knee pain, right anterior 02/15/2013  . CVA (cerebral infarction)  02/01/2013  . Gout 08/05/2012  . HTN (hypertension) 08/05/2012       GP    RUSSELL,CINDY 05/04/2013, 3:20 PM

## 2013-05-04 NOTE — Progress Notes (Signed)
Occupational Therapy Treatment Patient Details  Name: Bruce Guerrero MRN: [id number] Date of Birth: 02-06-1961  Today's Date: 05/04/2013 Time: 1520-1600 OT Time Calculation (min): 40 min Manual therapy 1520-1540 20' Neuro reed 1540-1600 20' Visit#: 21 of 36  Re-eval: 05/06/13    Authorization: BCBS   Subjective S:  My shoulder only hurts when I raise it to the side. (discussed the possible causes posture, impingement, and how to correct postural NSAIDs. Pain Assessment Currently in Pain?: Yes Pain Score: 2  Pain Location: Shoulder Pain Orientation: Right Pain Type: Acute pain  Precautions/Restrictions   progress as tolerated  Exercise/Treatments Seated Elevation: AROM;15 reps;Limitations Elevation Limitations: with mod facilitation from OT then patient leaning towards right while OT faciliated increased shoulder elevation  Retraction: AROM;15 reps Row: AROM;15 reps  Neurological Re-education Exercises Shoulder Protraction: Supine;AAROM;10 reps Shoulder Horizontal ABduction: Supine;AAROM;10 reps Shoulder External Rotation: Supine;AAROM;10 reps Shoulder Internal Rotation: Supine;AROM;10 reps Elbow Flexion: Supine;AROM;AAROM;10 reps Elbow Extension: Supine;AROM;AAROM;10 reps Forearm Supination: Supine;AAROM;10 reps Forearm Pronation: Supine;AAROM;10 reps Wrist Flexion: Supine;AAROM;10 reps  Weight Bearing Exercises Seated with weight on hand: seated EOM in static position while therapist educated patient on HEP for 10 minutes  Development of Reach  Development of Reach: Closed chain Closed Chain Exercises: seated with therapist providing resistance to patient while completing exercises into shoulder flexion and abduction         Manual Therapy Manual Therapy: Myofascial release Myofascial Release: MFR and manual massage to right upper arm and shoulder region to decrease fascial restrictions and pain.  Scapula presents with winging.   Occupational Therapy  Assessment and Plan OT Assessment and Plan Clinical Impression Statement: A:  Added MFR to right shoulder region to decrease shoulder pain.  Educated patient on scapular retraction and stabilty exercises.   OT Plan: P:  REASSESS, Standing weightbearing, facilitation of decreased lateral flexion of trunk.   Goals Short Term Goals Short Term Goal 1: Pt will be educated on HEP. Short Term Goal 2: Pt will be educated on use and wear of hand splint to decrease flexor tone. Short Term Goal 3: Pt will increase use of RUE to engage in AAROM in daily activities, 25% of the time. Short Term Goal 4: Pt will increase AROM in right shoulder to 25 degress of flexion to improve use of RUE in ADLs. Short Term Goal 5: Pt will don RLE AFO and shoe with modified independence Short Term Goal 6: Pt will tolerate weightbearing on RUE for 1 minute to improve use of RUE in ADLs Short Term Goal 7: Pt will tolerate wear of hand splint for 6 hours per day, with no signs/symptoms of skin breakdown Long Term Goals Long Term Goal 1: Pt will increase use of RUE to engage in AROM in daily activities, 50% of the time. Long Term Goal 2: Pt will increase AROM in right shoulder to 45 degress of flexion to improve use of RUE in ADLs. Long Term Goal 3: Pt will tolerate weightbearing on RUE for 5 minutes to improve use of RUE in ADLs Long Term Goal 4: Pt will attain 3+/5 strength in RUE to engage in daily acticities w RUE. Long Term Goal 5: Pt will don/doff hand splint w modified independence.  Problem List Patient Active Problem List   Diagnosis Date Noted  . Difficulty in walking(719.7) 03/08/2013  . Right leg weakness 03/08/2013  . Balance problems 03/08/2013  . Weakness of right upper extremity 03/08/2013  . Knee pain, right anterior 02/15/2013  . CVA (cerebral infarction) 02/01/2013  .  Gout 08/05/2012  . HTN (hypertension) 08/05/2012    End of Session Activity Tolerance: Patient tolerated treatment  well General Behavior During Therapy: [facility] tasks assessed/performed  GO    Shirlean MylarBethany H. Murray, OTR/L  05/04/2013, 4:50 PM

## 2013-05-06 ENCOUNTER — Ambulatory Visit (HOSPITAL_COMMUNITY)
Admission: RE | Admit: 2013-05-06 | Discharge: 2013-05-06 | Disposition: A | Discharge status: Home / Self Care | Payer: BC Managed Care – PPO | Source: Ambulatory Visit | Attending: Family Medicine | Admitting: Family Medicine

## 2013-05-06 DIAGNOSIS — R262 Difficulty in walking, not elsewhere classified: Secondary | ICD-10-CM

## 2013-05-06 DIAGNOSIS — R29898 Other symptoms and signs involving the musculoskeletal system: Secondary | ICD-10-CM

## 2013-05-06 DIAGNOSIS — R2689 Other abnormalities of gait and mobility: Secondary | ICD-10-CM

## 2013-05-06 NOTE — Evaluation (Signed)
Occupational Therapy Re-Evaluation  Patient Details  Name: Bruce Guerrero MRN: [id number] Date of Birth: [birth year]  Today's Date: 05/06/2013 Time: 1271-0414 OT Time Calculation (min): 48 min ROM Measurements 1520-1540 (20') Manual 1540-1550 (10') Neuromuscular Re-Ed 1550-1608 (18')  Visit#: 22 of 36  Re-eval: 06/03/13  Assessment Diagnosis: CVA - dominant side Next MD Visit: March 16th Prior Therapy: CIR for 3 weeks - [facility]  Authorization: BCBS  Authorization Time Period:    Authorization Visit#:   of     Past Medical History:  Past Medical History  Diagnosis Date  . Gout   . Hypertension   . Stroke    Past Surgical History: No past surgical history on file.  Subjective Symptoms/Limitations Symptoms: S: "I'm certinaly using it more. I carred a chair with my right arm the other day - it had hand hold that I could use." "I would be weightbearing 24/7 if I could." Pain Assessment Currently in Pain?: No/denies  Precautions/Restrictions  Precautions Precautions: Fall Restrictions Weight Bearing Restrictions: No  Balance Screening Balance Screen Has the patient fallen in the past 6 months: No  Prior Functioning  Home Living Family/patient expects to be discharged to:: Private residence  Lives With: Son;Family Prior Function Level of Independence: Independent with basic ADLs;Independent with homemaking with ambulation;Independent with gait  Assessment ADL/Vision/Perception ADL ADL Comments: Still can't cut food, use right hand to put car in gear (not driving currenlty). Pt used L for eating and grooming, dresses with independence. Pt uses shoe buttons. Independent with toileting, and bathing (uses shower chair, but also stands during shower).  Dominant Hand: Right Vision - History Baseline Vision: No visual deficits  Cognition/Observation Cognition Overall Cognitive Status: Within Functional Limits for tasks assessed Arousal/Alertness:  Awake/alert Orientation Level: Oriented X4  Sensation/Coordination/Edema Sensation Light Touch: Appears Intact Proprioception: Appears Intact Coordination Gross Motor Movements are Fluid and Coordinated: No Fine Motor Movements are Fluid and Coordinated: No Edema Edema: mild edema in right hand (Pt not wearing edema glove during this session)  Additional Assessments RUE Assessment RUE Assessment: Exceptions to [facility] RUE Strength RUE Overall Strength Comments: Pt has elevation (quarter range), elbow flexion (three quarters range), pronation, supination (to neutral), digit extension (trace in 2nd, 3rd, 4th), digit flexion (full)), wrist extention (to neutral)), wrist flexion (half range) LUE Assessment LUE Assessment: Within Functional Limits     Exercise/Treatments Neurological Re-education Exercises Shoulder Flexion: Supine;AROM (6 reps) Shoulder Protraction: Supine;AROM (6 reps) Elbow Flexion: Supine;AROM (6 reps) Elbow Extension: Supine;AROM (6 reps)  Weight Bearing Exercises Weight Bearing Position: Standing Seated with weight on hand: seated EOM with wrist and elbow extended, while shifting seated weight left and right Standing; Reaching overhead: Standing at kitchen counter, while reaching overhead into cabinet with LUE and reaching across right side to put plates down. Removed and replaced 8 plates.while weightbearing on RUE.  Manual Therapy Manual Therapy: Myofascial release Myofascial Release: MFR and manual massage to right upper arm and shoulder region to decrease fascial restrictions and pain.  Occupational Therapy Assessment and Plan OT Assessment and Plan Clinical Impression Statement: A: Pt reassessed this date and is demonstrating improvements. Pt has met 6/7 STG 9with 1 discontinued) and 2/5 LTG. Pt is progressing towards remaining 3 LTG. Pt has improvements in his RUE AROM and reports using his RUE with increased frequency. OT Plan: P: MFR. Standing  weightbearing, facilitation of decreased lateral flexion of trunk.   Goals Home Exercise Program Pt/caregiver will Perform Home Exercise Program: For increased strengthening Short Term Goals Short  Term Goal 1: Pt will be educated on HEP. Short Term Goal 1 Progress: Met Short Term Goal 2: Pt will be educated on use and wear of hand splint to decrease flexor tone. Short Term Goal 2 Progress: Met Short Term Goal 3: Pt will increase use of RUE to engage in AAROM in daily activities, 25% of the time. Short Term Goal 3 Progress: Met Short Term Goal 4: Pt will increase AROM in right shoulder to 25 degress of flexion to improve use of RUE in ADLs. Short Term Goal 4 Progress: Met Short Term Goal 5: Pt will don RLE AFO and shoe with modified independence Short Term Goal 5 Progress: Discontinued (comment) Short Term Goal 6: Pt will tolerate weightbearing on RUE for 1 minute to improve use of RUE in ADLs Short Term Goal 6 Progress: Met Short Term Goal 7: Pt will tolerate wear of hand splint for 6 hours per day, with no signs/symptoms of skin breakdown Short Term Goal 7 Progress: Met Long Term Goals Long Term Goal 1: Pt will increase use of RUE to engage in AROM in daily activities, 50% of the time. (Pt reports using RUe 25% of the time in daily activities) Long Term Goal 1 Progress: Progressing toward goal Long Term Goal 2: Pt will increase AROM in right shoulder to 45 degress of flexion to improve use of RUE in ADLs. Long Term Goal 2 Progress: Progressing toward goal Long Term Goal 3: Pt will tolerate weightbearing on RUE for 5 minutes to improve use of RUE in ADLs Long Term Goal 3 Progress: Met Long Term Goal 4: Pt will attain 3+/5 strength in RUE to engage in daily acticities w RUE. Long Term Goal 4 Progress: Progressing toward goal Long Term Goal 5: Pt will don/doff hand splint w modified independence. Long Term Goal 5 Progress: Met  Problem List Patient Active Problem List   Diagnosis Date  Noted  . Difficulty in walking(719.7) 03/08/2013  . Right leg weakness 03/08/2013  . Balance problems 03/08/2013  . Weakness of right upper extremity 03/08/2013  . Knee pain, right anterior 02/15/2013  . CVA (cerebral infarction) 02/01/2013  . Gout 08/05/2012  . HTN (hypertension) 08/05/2012    End of Session Activity Tolerance: Patient tolerated treatment well General Behavior During Therapy: [facility] for tasks assessed/performed  GO    Bea Graff, MS, OTR/L [phone]  05/06/2013, 4:21 PM   Physician Documentation Your signature is required to indicate approval of the treatment plan as stated above.  Please sign and either send electronically or make a copy of this report for your files and return this physician signed original.  Please mark one 1.__approve of plan  2. ___approve of plan with the following conditions.   ______________________________                                                          _____________________ Physician Signature  Date  

## 2013-05-06 NOTE — Progress Notes (Signed)
Physical Therapy Treatment Patient Details  Name: Bruce Guerrero MRN: [id number] Date of Birth: 03/04/1961  Today's Date: 05/06/2013 Time: 1430-1515 PT Time Calculation (min): 45 min Charge: TE 1430-1515  Visit#: 22 of 30  Re-eval: 05/08/13 Assessment Diagnosis: CVA  Next MD Visit: Kirstein 05/23/2013 Prior Therapy: Inpt rehab.  Authorization: BCBS   Authorization Time Period:    Authorization Visit#:   of     Subjective: Symptoms/Limitations Symptoms: Pt has been walking with SPC or no AD around the house, no reports of falls. Pain Assessment Currently in Pain?: No/denies  Precautions/Restrictions  Precautions Precautions: Fall  Exercise/Treatments Aerobic Stationary Bike: nustep  hill level #5, resistance L5 x 10:00 SPM 109 Machines for Strengthening Cybex Knee Extension: 1 PL Rt LE only x 15 Cybex Knee Flexion: 2.0 PL Rt LE x 10 Cybex Leg Press: 4 PL Rt only x 15 Standing Knee Flexion: 10 reps;Limitations Knee Flexion Limitations: With manual tapping to increase contraction, multimodal cueing to avoid hip flexion Functional Squat: 10 reps;10 seconds Lunge Walking - Round Trips: 1/2 RT Gait Training: With SPC indoors working on equalizing weight shift and increasing right knee flexion Other Standing Knee Exercises: tandem stance x 30" x 2    Physical Therapy Assessment and Plan PT Assessment and Plan Clinical Impression Statement: Improved knee flexion with gait following manual tapping.  Session focus on functional strengtehning with focus on  hamstring activation.  Pt limited by fatigue at end of session, no reports of pain.   PT Plan: Re-eval next session.  Continue to progress LE strength with focus on hamstring and gluteal musculature. Continue to improve gait mechanics and balance.    Goals Home Exercise Program Pt/caregiver will Perform Home Exercise Program: For increased strengthening PT Short Term Goals Time to Complete Short Term Goals: 3  weeks PT Short Term Goal 1: Pt to be able to stand for 20 minutes to socialize PT Short Term Goal 1 - Progress: Not met PT Short Term Goal 2: Pt to be able to walk with QC inside x 15 minutes. PT Short Term Goal 2 - Progress: Progressing toward goal PT Short Term Goal 3: Pt strength to be increased 1/2 grade to allow the above to occur. PT Short Term Goal 3 - Progress: Progressing toward goal PT Short Term Goal 4: Pt Berg to be increased 7 pts to improve safety of ambulation. PT Long Term Goals PT Long Term Goal 1: Pt to be I in advance HEP PT Long Term Goal 2: PT to be able to walk outside with cane x 30 mintues Long Term Goal 3: Pt to be able to walk inside without an assistive device Long Term Goal 3 Progress: Progressing toward goal Long Term Goal 4: Pt to berg score to increase by 14 points to improve safety of ambulation Long Term Goal 4 Progress: Progressing toward goal  Problem List Patient Active Problem List   Diagnosis Date Noted  . Difficulty in walking(719.7) 03/08/2013  . Right leg weakness 03/08/2013  . Balance problems 03/08/2013  . Weakness of right upper extremity 03/08/2013  . Knee pain, right anterior 02/15/2013  . CVA (cerebral infarction) 02/01/2013  . Gout 08/05/2012  . HTN (hypertension) 08/05/2012    PT - End of Session Equipment Utilized During Treatment: Gait belt Activity Tolerance: Patient tolerated treatment well;Patient limited by fatigue General Behavior During Therapy: WFL for tasks assessed/performed  GP    Cockerham, Casey Jo 05/06/2013, 3:23 PM  

## 2013-05-09 ENCOUNTER — Ambulatory Visit (HOSPITAL_COMMUNITY)
Admission: RE | Admit: 2013-05-09 | Discharge: 2013-05-09 | Disposition: A | Discharge status: Home / Self Care | Payer: BC Managed Care – PPO | Source: Ambulatory Visit | Attending: Family Medicine | Admitting: Family Medicine

## 2013-05-09 DIAGNOSIS — I69998 Other sequelae following unspecified cerebrovascular disease: Secondary | ICD-10-CM | POA: Insufficient documentation

## 2013-05-09 DIAGNOSIS — R262 Difficulty in walking, not elsewhere classified: Secondary | ICD-10-CM | POA: Insufficient documentation

## 2013-05-09 DIAGNOSIS — R279 Unspecified lack of coordination: Secondary | ICD-10-CM | POA: Insufficient documentation

## 2013-05-09 DIAGNOSIS — R269 Unspecified abnormalities of gait and mobility: Secondary | ICD-10-CM | POA: Insufficient documentation

## 2013-05-09 DIAGNOSIS — IMO0001 Reserved for inherently not codable concepts without codable children: Secondary | ICD-10-CM | POA: Insufficient documentation

## 2013-05-09 DIAGNOSIS — M6281 Muscle weakness (generalized): Secondary | ICD-10-CM | POA: Insufficient documentation

## 2013-05-09 NOTE — Evaluation (Addendum)
Physical Therapy Evaluation  Patient Details  Name: Bruce Guerrero MRN: [id number] Date of Birth: 12-21-1960  Today's Date: 05/09/2013 Time: 1345-1430  PT Time Calculation (min): 45 min Charge: physical performance: 1350-1408;  Mm test [id number];  There ex 1420-1430             Visit#: 23 of 35  Re-eval: 06/08/13   Authorization: BCBS      Past Medical History:  Past Medical History  Diagnosis Date  . Gout   . Hypertension   . Stroke    Past Surgical History: No past surgical history on file.  Subjective Symptoms/Limitations Symptoms: Pt states he is still having difficulty bending his knee.  He states the more he does the easier it gets.  But once he sits down it tightens back up.   Pertinent History: independent prior to admission, working full-time who sustained a L CVA on 02/01/2013.  He was admitted to [facility] and then admitted to the IP rehab floor.  He was discharge on12/20/2014 and is now referred to OP PT to maximize his functional ability How long can you sit comfortably?: no problem How long can you stand comfortably?: Able to stand for 10 minutes was ten minutes  How long can you walk comfortably?: Pt was walking with a walker for ten minutes now he is using a cane and is walking 10 minutes. Pain Assessment Currently in Pain?: Yes Pain Score: 3  Pain Location: Shoulder Pain Orientation: Right Pain Type: Chronic pain  Precautions/Restrictions  Precautions Precautions: Fall Restrictions Weight Bearing Restrictions: No Prior Functioning  Home Living Family/patient expects to be discharged to:: Private residence  Lives With: Son;Family Prior Function Level of Independence: Independent with basic ADLs;Independent with homemaking with ambulation;Independent with gait  Cognition/Observation Cognition Overall Cognitive Status: Within Functional Limits for tasks assessed Arousal/Alertness: Awake/alert Orientation Level: Oriented  X4  Sensation/Coordination/Flexibility/Functional Tests Sensation Light Touch: Appears Intact Proprioception: Appears Intact Coordination Gross Motor Movements are Fluid and Coordinated: No Fine Motor Movements are Fluid and Coordinated: No  Assessment RUE Assessment RUE Assessment: Exceptions to [facility] RUE Strength RUE Overall Strength Comments: Pt has elevation (quarter range), elbow flexion (three quarters range), pronation, supination (to neutral), digit extension (trace in 2nd, 3rd, 4th), digit flexion (full)), wrist extention (to neutral)), wrist flexion (half range) LUE Assessment LUE Assessment: Within Functional Limits RLE Strength Right Hip Flexion: 3+/5 (was 2/5) Right Hip Extension: 2-/5 (was 2-/5) Right Hip ABduction: 3/5 (was 2/5) Right Hip ADduction: 5/5 (was 2/5) Right Knee Flexion: 3/5 (was 2-/5) Right Knee Extension: 3+/5 (was 2-/5) Right Ankle Dorsiflexion: 3-/5 (was 1/5) Right Ankle Plantar Flexion: 1/5 (was 1/5) Right Ankle Inversion: 2/5 (was 1/5) Right Ankle Eversion: 1/5 (was 1/5)  Exercise/Treatments Mobility/Balance  Berg Balance Test Sit to Stand: Able to stand without using hands and stabilize independently Standing Unsupported: Able to stand safely 2 minutes Sitting with Back Unsupported but Feet Supported on Floor or Stool: Able to sit safely and securely 2 minutes Stand to Sit: Sits safely with minimal use of hands Transfers: Able to transfer safely, minor use of hands Standing Unsupported with Eyes Closed: Able to stand 10 seconds safely Standing Ubsupported with Feet Together: Able to place feet together independently and stand 1 minute safely From Standing, Reach Forward with Outstretched Arm: Can reach confidently >25 cm (10") From Standing Position, Pick up Object from Floor: Able to pick up shoe, needs supervision From Standing Position, Turn to Look Behind Over each Shoulder: Looks behind from both sides and  weight shifts well Turn 360  Degrees: Able to turn 360 degrees safely but slowly Standing Unsupported, Alternately Place Feet on Step/Stool: Able to complete 4 steps without aid or supervision Standing Unsupported, One Foot in Front: Able to plae foot ahead of the other independently and hold 30 seconds Standing on One Leg: Tries to lift leg/unable to hold 3 seconds but remains standing independently Total Score: 47  Was 37  Seated Other Seated Knee Exercises: glut set x 10; Ankle dorsi/plantar flexion x 10  Sidelying  abduction x 10 Prone  Hamstring Curl: 10 reps     Physical Therapy Assessment and Plan PT Assessment and Plan Clinical Impression Statement: Pt reassessed today.  Pt has improved significantly in his balance.  Major deficits in strength includes all ankle mm, hamstring and gluteal mm.  Pt treatment to emphasis strengthening these mm with less emphasis on balance at this point. Pt will benefit from skilled therapeutic intervention in order to improve on the following deficits: Abnormal gait;Decreased activity tolerance;Decreased balance;Decreased mobility;Decreased strength;Difficulty walking;Impaired tone Rehab Potential: Good PT Frequency: Min 3X/week PT Duration: 4 weeks (for a total of 9 weeks. ) PT Treatment/Interventions: Gait training;Therapeutic activities;Therapeutic exercise;Balance training;Neuromuscular re-education;Patient/family education PT Plan: try russian stim on gastroc and anterior tib reciprocal in nature.  Strengthen glut and hamstring mm     Goals Home Exercise Program PT Goal: Perform Home Exercise Program - Progress: Progressing toward goal PT Short Term Goals PT Short Term Goal 1: Pt to be able to stand for 20 minutes to socialize PT Short Term Goal 1 - Progress: Not met PT Short Term Goal 2: Pt to be able to walk with QC inside x 15 minutes. PT Short Term Goal 2 - Progress: Met PT Short Term Goal 3: Pt strength to be increased 1/2 grade to allow the above to occur. PT  Short Term Goal 3 - Progress: Progressing toward goal PT Short Term Goal 4: Pt Berg to be increased 7 pts to improve safety of ambulation. PT Short Term Goal 4 - Progress: Met PT Long Term Goals PT Long Term Goal 1: Pt to be I in advance HEP PT Long Term Goal 1 - Progress: Met PT Long Term Goal 2: PT to be able to walk outside with cane x 30 mintues PT Long Term Goal 2 - Progress: Not met Long Term Goal 3: Pt to be able to walk inside without an assistive device Long Term Goal 3 Progress: Progressing toward goal Long Term Goal 4: Pt to berg score to increase by 14 points to improve safety of ambulation Long Term Goal 4 Progress: Progressing toward goal  Problem List Patient Active Problem List   Diagnosis Date Noted  . Difficulty in walking(719.7) 03/08/2013  . Right leg weakness 03/08/2013  . Balance problems 03/08/2013  . Weakness of right upper extremity 03/08/2013  . Knee pain, right anterior 02/15/2013  . CVA (cerebral infarction) 02/01/2013  . Gout 08/05/2012  . HTN (hypertension) 08/05/2012    PT Plan of Care PT Home Exercise Plan: new given.   GP    RUSSELL,CINDY 05/09/2013, 3:03 PM  Physician Documentation Your signature is required to indicate approval of the treatment plan as stated above.  Please sign and either send electronically or make a copy of this report for your files and return this physician signed original.   Please mark one 1.__approve of plan  2. ___approve of plan with the following conditions.   ______________________________  _____________________ Physician Signature                                                                                                             Date

## 2013-05-09 NOTE — Progress Notes (Signed)
Occupational Therapy Treatment Patient Details  Name: Bruce Guerrero MRN: [id number] Date of Birth: Jan 25, 1961  Today's Date: 05/09/2013 Time: 5427-0623 OT Time Calculation (min): 49 min Manual 1435-1455 (20') Neuro Re-Ed 1455-1524 (38')  Visit#: 23 of 36  Re-eval: 06/03/13    Authorization: BCBS  Authorization Time Period:    Authorization Visit#:   of    Subjective Symptoms/Limitations Symptoms: S: "Normal pain now- just my shoulder. I think just being up and about it makeing it hurt. When i got up this morning it didn't hurt, but after walking around the house it started hurting." Pain Assessment Currently in Pain?: Yes Pain Score: 3  Pain Location: Shoulder Pain Orientation: Right Pain Type: Chronic pain  Precautions/Restrictions  Precautions Precautions: Fall Restrictions Weight Bearing Restrictions: No  Exercise/Treatments Seated Elevation: AAROM;20 reps (w scapular faciliation) Extension: AAROM;20 reps (w scapular faciliation) Neurological Re-education Exercises Elbow Flexion: Supine;AROM;10 reps Elbow Extension: AROM;Supine;10 reps Forearm Supination: Seated;AAROM (8 reps, with cone) Forearm Pronation: Seated;AAROM (8 reps, with cone)  Weight Bearing Exercises Weight Bearing Position: Standing Standing; Reaching overhead: Standing at raised table - weigthbearing through RUE while reaching to place 5 cones as far waord as possible, and bring them back. 2x     Manual Therapy Manual Therapy: Scapular mobilization Myofascial Release: MFR and manual massage to right upper arm and shoulder region to decrease fascial restrictions and pain. Scapular Mobilization: Standing at table, mobilized scapula while pt engged in sholder elevation and extension exercises  Occupational Therapy Assessment and Plan OT Assessment and Plan Clinical Impression Statement: A: Pt remains with tightness in right upper arm region. Pt has improvements in supination with cone,  actively moving past neutral. Pt has tightness in forearm beyond this point.  Pt will benefit from skilled therapeutic intervention in order to improve on the following deficits: Abnormal gait;Decreased activity tolerance;Decreased balance;Decreased mobility;Decreased strength;Difficulty walking;Impaired tone OT Plan: P: Standing weightbearing. Attempt table push ups. Facilitation of decreased lateral flexion of trunk.   Goals Home Exercise Program PT Goal: Perform Home Exercise Program - Progress: Progressing toward goal Short Term Goals Short Term Goal 1: Pt will be educated on HEP. Short Term Goal 1 Progress: Met Short Term Goal 2: Pt will be educated on use and wear of hand splint to decrease flexor tone. Short Term Goal 2 Progress: Met Short Term Goal 3: Pt will increase use of RUE to engage in AAROM in daily activities, 25% of the time. Short Term Goal 3 Progress: Met Short Term Goal 4: Pt will increase AROM in right shoulder to 25 degress of flexion to improve use of RUE in ADLs. Short Term Goal 4 Progress: Met Short Term Goal 5: Pt will don RLE AFO and shoe with modified independence Short Term Goal 5 Progress: Discontinued (comment) Short Term Goal 6: Pt will tolerate weightbearing on RUE for 1 minute to improve use of RUE in ADLs Short Term Goal 6 Progress: Met Short Term Goal 7: Pt will tolerate wear of hand splint for 6 hours per day, with no signs/symptoms of skin breakdown Short Term Goal 7 Progress: Met Long Term Goals Long Term Goal 1: Pt will increase use of RUE to engage in AROM in daily activities, 50% of the time. Long Term Goal 1 Progress: Progressing toward goal Long Term Goal 2: Pt will increase AROM in right shoulder to 45 degress of flexion to improve use of RUE in ADLs. Long Term Goal 2 Progress: Progressing toward goal Long Term Goal 3: Pt will tolerate weightbearing  on RUE for 5 minutes to improve use of RUE in ADLs Long Term Goal 3 Progress: Met Long Term  Goal 4: Pt will attain 3+/5 strength in RUE to engage in daily acticities w RUE. Long Term Goal 4 Progress: Progressing toward goal Long Term Goal 5: Pt will don/doff hand splint w modified independence. Long Term Goal 5 Progress: Met  Problem List Patient Active Problem List   Diagnosis Date Noted  . Difficulty in walking(719.7) 03/08/2013  . Right leg weakness 03/08/2013  . Balance problems 03/08/2013  . Weakness of right upper extremity 03/08/2013  . Knee pain, right anterior 02/15/2013  . CVA (cerebral infarction) 02/01/2013  . Gout 08/05/2012  . HTN (hypertension) 08/05/2012    End of Session Activity Tolerance: Patient tolerated treatment well General Behavior During Therapy: [facility] for tasks assessed/performed  GO    Bea Graff, MS, OTR/L [phone]  05/09/2013, 4:44 PM

## 2013-05-11 ENCOUNTER — Ambulatory Visit (HOSPITAL_COMMUNITY)
Admission: RE | Admit: 2013-05-11 | Discharge: 2013-05-11 | Disposition: A | Discharge status: Home / Self Care | Payer: BC Managed Care – PPO | Source: Ambulatory Visit | Attending: Family Medicine | Admitting: Family Medicine

## 2013-05-11 NOTE — Progress Notes (Signed)
Physical Therapy Treatment Patient Details  Name: Bruce Guerrero MRN: [id number] Date of Birth: [birth year]  Today's Date: 05/11/2013 Time: 1435-1520 PT Time Calculation (min): 45 min Charges: Therex x 78'(2956-213030'(1435-1505) Estim x 10'(1510-1520)    Visit#: 24 of 35  Re-eval: 06/08/13  Authorization: BCBS     Subjective: Symptoms/Limitations Symptoms: Pt reports continues HEP compliance. Pain Assessment Currently in Pain?: No/denies  Exercise/Treatments Standing Heel Raises: 20 reps;Limitations Heel Raises Limitations: right and left staggered stance 10 x each Knee Flexion: 10 reps;Limitations Knee Flexion Limitations: With manual tapping to increase contraction, multimodal cueing to avoid hip flexion Forward Lunges: 20 reps;Limitations Forward Lunges Limitations: RLE on 8" box  Functional Squat: 10 reps Other Standing Knee Exercises: Right hip flexion x 10 with therapist facilitating knee flexion   Modalities Modalities: Archivistlectrical Stimulation Electrical Stimulation Electrical Stimulation Location: Anterior tibialis and gastroc/soleus Electrical Stimulation Action: Russian: Midwifeplantarflexion/dorsiflexion  Electrical Stimulation Parameters: Reciprocal, 10/10 cycle, 100 BPS, 50%, 10 minutes Electrical Stimulation Goals: Neuromuscular facilitation  Physical Therapy Assessment and Plan PT Assessment and Plan Clinical Impression Statement: Treatment focus on improving RLE strength. Pt requires multimodal cueing to equalize weight shift with standing activities.  Began reciprocal Guern[place]ian estim to tibialis anterior and gastroc/soleus. Able to achieve good contraction for both plantarflexors and dorsiflexors.   Pt will benefit from skilled therapeutic intervention in order to improve on the following deficits: Abnormal gait;Decreased activity tolerance;Decreased balance;Decreased mobility;Decreased strength;Difficulty walking;Impaired tone PT Treatment/Interventions: Gait  training;Therapeutic activities;Therapeutic exercise;Balance training;Neuromuscular re-education;Patient/family education PT Plan: Continue to progress strength and stability per PT POC. Follow up regarding effects of Gu[place]ian estim.     Problem List Patient Active Problem List   Diagnosis Date Noted  . Difficulty in walking(719.7) 03/08/2013  . Right leg weakness 03/08/2013  . Balance problems 03/08/2013  . Weakness of right upper extremity 03/08/2013  . Knee pain, right anterior 02/15/2013  . CVA (cerebral infarction) 02/01/2013  . Gout 08/05/2012  . HTN (hypertension) 08/05/2012    PT - End of Session Equipment Utilized During Treatment: Gait belt Activity Tolerance: Patient tolerated treatment well General Behavior During Therapy: [facility] for tasks assessed/performed  Seth Bakeebekah Lashon Beringer, PTA  05/11/2013, 5:47 PM

## 2013-05-13 ENCOUNTER — Ambulatory Visit (HOSPITAL_COMMUNITY)
Admission: RE | Admit: 2013-05-13 | Discharge: 2013-05-13 | Disposition: A | Discharge status: Home / Self Care | Payer: BC Managed Care – PPO | Source: Ambulatory Visit | Attending: Family Medicine | Admitting: Family Medicine

## 2013-05-13 DIAGNOSIS — R29898 Other symptoms and signs involving the musculoskeletal system: Secondary | ICD-10-CM

## 2013-05-13 DIAGNOSIS — R2689 Other abnormalities of gait and mobility: Secondary | ICD-10-CM

## 2013-05-13 DIAGNOSIS — R262 Difficulty in walking, not elsewhere classified: Secondary | ICD-10-CM

## 2013-05-13 NOTE — Progress Notes (Signed)
Occupational Therapy Treatment Patient Details  Name: Bruce Guerrero MRN: [id number] Date of Birth: 04/20/1960  Today's Date: 05/13/2013 Time: 1520-1610 OT Time Calculation (min): 50 min Manual 1520-1530 (10') Self-Care 1530-1540 (10') Neuro Re-Ed 1540-1610 (30')  Visit#: 24 of 36  Re-eval: 06/03/13    Authorization: BCBS  Authorization Time Period:    Authorization Visit#:   of    Subjective Symptoms/Limitations Symptoms: S: "from time to time, i'm feeling a lingly feeling in my little finger and the one next to it." Pain Assessment Currently in Pain?: No/denies  Precautions/Restrictions     Exercise/Treatments Neurological Re-education Exercises Shoulder Protraction: Supine;10 reps;AAROM Elbow Flexion: Supine;AROM;10 reps Elbow Extension: AROM;Supine;10 reps Forearm Supination: Supine;10 reps;AAROM Forearm Pronation: Supine;10 reps;AROM  Weight Bearing Exercises Seated with weight on hand: seated EOM with wrist and elbow extended, while shifting seated weight left and right  Development of Reach  Development of Reach: Haynes Hoehn Ring Tree: Saobo arch with no extenders - facilitated pt protracting shoulder forward and extending elbow towards ring-ball, guding ball over arch. 4 reps each direction. cues to maintain posture without leaning.     Manual Therapy Manual Therapy: Scapular mobilization Myofascial Release: MFR and manual massage to right upper arm and shoulder region to decrease fascial restrictions and pain.   Scapular Mobilization: Standing at table, moblized scapula wile pt engaged in shoulder elevation and protraction  Weight Bearing Technique Weight Bearing Technique: Yes RUE Weight Bearing Technique: Extended arm seated;Extended arm standing Response to Weight Bearing Technique: Relaxation of flexor tone in hand Activities of Daily Living Activities of Daily Living: Pt had right shoe off from previous PT session  - educated pt on one-handed shoe  tying technique. Pt stated he will try in the morning. Pt indicated he will be washing car this weekend - simulated car washing motion with washcloth on table top. Pt had increased flexor tone with scrubbing - encouraged pt to try weightbearing while with scrubbing motion overt he weekend..  Occupational Therapy Assessment and Plan OT Assessment and Plan Clinical Impression Statement: A: Pt tolerated well standing shouler protraction, horizontal abduction, and elbow extension activity. Pt had shoe off and was open to learning one handed shoe tying technique - pt states he will tryin in the morning. OT Plan: P: Standing weightbearing. Attempt table push ups. Facilitation of decreased lateral flexion of trunk.   Goals Short Term Goals Short Term Goal 1: Pt will be educated on HEP. Short Term Goal 1 Progress: Met Short Term Goal 2: Pt will be educated on use and wear of hand splint to decrease flexor tone. Short Term Goal 2 Progress: Met Short Term Goal 3: Pt will increase use of RUE to engage in AAROM in daily activities, 25% of the time. Short Term Goal 3 Progress: Met Short Term Goal 4: Pt will increase AROM in right shoulder to 25 degress of flexion to improve use of RUE in ADLs. Short Term Goal 4 Progress: Met Short Term Goal 5: Pt will don RLE AFO and shoe with modified independence Short Term Goal 5 Progress: Discontinued (comment) Short Term Goal 6: Pt will tolerate weightbearing on RUE for 1 minute to improve use of RUE in ADLs Short Term Goal 6 Progress: Met Short Term Goal 7: Pt will tolerate wear of hand splint for 6 hours per day, with no signs/symptoms of skin breakdown Short Term Goal 7 Progress: Met Long Term Goals Long Term Goal 1: Pt will increase use of RUE to engage in AROM in daily  activities, 50% of the time. Long Term Goal 1 Progress: Progressing toward goal Long Term Goal 2: Pt will increase AROM in right shoulder to 45 degress of flexion to improve use of RUE in  ADLs. Long Term Goal 2 Progress: Progressing toward goal Long Term Goal 3: Pt will tolerate weightbearing on RUE for 5 minutes to improve use of RUE in ADLs Long Term Goal 3 Progress: Met Long Term Goal 4: Pt will attain 3+/5 strength in RUE to engage in daily acticities w RUE. Long Term Goal 4 Progress: Progressing toward goal Long Term Goal 5: Pt will don/doff hand splint w modified independence. Long Term Goal 5 Progress: Met  Problem List Patient Active Problem List   Diagnosis Date Noted  . Difficulty in walking(719.7) 03/08/2013  . Right leg weakness 03/08/2013  . Balance problems 03/08/2013  . Weakness of right upper extremity 03/08/2013  . Knee pain, right anterior 02/15/2013  . CVA (cerebral infarction) 02/01/2013  . Gout 08/05/2012  . HTN (hypertension) 08/05/2012    End of Session Activity Tolerance: Patient tolerated treatment well General Behavior During Therapy: [facility] for tasks assessed/performed  GO    Bea Graff, [place], OTR/L [phone]  05/13/2013, 4:23 PM

## 2013-05-13 NOTE — Progress Notes (Addendum)
Physical Therapy Treatment Patient Details  Name: Bruce Guerrero MRN: [id number] Date of Birth: 01-06-1961  Today's Date: 05/13/2013 Time: 7829-56211433-1523 PT Time Calculation (min): 50 min Charge TE 3086-57841433-1508, Estim [id number]  Visit#: 25 of 35  Re-eval: 06/08/13 Assessment Diagnosis: CVA  Next MD Visit: Doroteo BradfordKirstein 05/23/2013 Prior Therapy: [facility]hab.  Authorization: BCBS   Authorization Time Period:    Authorization Visit#:   of     Subjective: Symptoms/Limitations Symptoms: Pt entered dept ambulating with ankle brace per PT request last session, currently pain free today.   Pain Assessment Currently in Pain?: No/denies  Precautions/Restrictions  Precautions Precautions: Fall  Exercise/Treatments Standing Heel Raises: 20 reps;Limitations Heel Raises Limitations: right and left staggered stance 20 x each Knee Flexion: 10 reps;Limitations Knee Flexion Limitations: With manual tapping to increase contraction, multimodal cueing to avoid hip flexion during gait Functional Squat: 15 reps;Limitations Functional Squat Limitations: deep with Rt LE behind Gait Training: No AD; staggered stance focusing on heel to toe pattern with therapist facilitation Rt knee flexion and plantar flexion; 5RT   Modalities Modalities: Archivistlectrical Stimulation Electrical Stimulation Electrical Stimulation Location: Anterior tibialis and gastroc/soleus Electrical Stimulation Action: Russian: Dealerplantarflexion/dorsiflexion Electrical Stimulation Parameters: Reciprocal, 10/10 cycle, 100 BPS, 50%, 10 minutes   Physical Therapy Assessment and Plan PT Assessment and Plan Clinical Impression Statement: Session focus on improving gait mechanics with therapist facilitation to improve heel to toe pattern and knee flexion for Rt LE.  Continued with russian estim to Rt tibalis anterior and gastrocnemius musculature with good contractions noted.  Pt limited by fatigue end of session, no reports of increased pain.   Referral made for AFO for Rt ankle. PT Plan: Continue to progress strength and stability per PT POC. F/U with referral for AFO Rt ankle    Goals PT Short Term Goals PT Short Term Goal 1: Pt to be able to stand for 20 minutes to socialize PT Short Term Goal 1 - Progress: Progressing toward goal PT Short Term Goal 2: Pt to be able to walk with QC inside x 15 minutes. PT Short Term Goal 3: Pt strength to be increased 1/2 grade to allow the above to occur. PT Short Term Goal 3 - Progress: Progressing toward goal PT Short Term Goal 4: Pt Berg to be increased 7 pts to improve safety of ambulation. PT Long Term Goals PT Long Term Goal 1: Pt to be I in advance HEP PT Long Term Goal 2: PT to be able to walk outside with cane x 30 mintues Long Term Goal 3: Pt to be able to walk inside without an assistive device Long Term Goal 3 Progress: Progressing toward goal Long Term Goal 4: Pt to berg score to increase by 14 points to improve safety of ambulation Long Term Goal 4 Progress: Progressing toward goal  Problem List Patient Active Problem List   Diagnosis Date Noted  . Difficulty in walking(719.7) 03/08/2013  . Right leg weakness 03/08/2013  . Balance problems 03/08/2013  . Weakness of right upper extremity 03/08/2013  . Knee pain, right anterior 02/15/2013  . CVA (cerebral infarction) 02/01/2013  . Gout 08/05/2012  . HTN (hypertension) 08/05/2012    PT - End of Session Equipment Utilized During Treatment: Gait belt Activity Tolerance: Patient tolerated treatment well General Behavior During Therapy: [facility] for tasks assessed/performed  GP    Juel BurrowCockerham, Casey Jo 05/13/2013, 7:11 PM

## 2013-05-16 ENCOUNTER — Ambulatory Visit (HOSPITAL_COMMUNITY)
Admission: RE | Admit: 2013-05-16 | Discharge: 2013-05-16 | Disposition: A | Discharge status: Home / Self Care | Payer: BC Managed Care – PPO | Source: Ambulatory Visit | Attending: Family Medicine | Admitting: Family Medicine

## 2013-05-16 NOTE — Progress Notes (Signed)
Physical Therapy Treatment Patient Details  Name: Hazle QuantWilliam S Crandall MRN: [id number] Date of Birth: 1960/09/05  Today's Date: 05/16/2013 Time: 1520-1600 PT Time Calculation (min): 40 min Charges: Therex x [id number]'(1522-1545) Estim x 1 (7253-6644(1548-1600)  Visit#: 26 of 35  Re-eval: 06/08/13  Authorization: BCBS     Subjective: Symptoms/Limitations Symptoms: Pt states that he has noticed improve ankle strength since beginning [place]ian electrical stimulation.  Pain Assessment Currently in Pain?: No/denies Pain Score: 1  Pain Location: Shoulder Pain Orientation: Right  Exercise/Treatments Standing Knee Flexion: 10 reps;Limitations Knee Flexion Limitations: With manual tapping to increase contraction, multimodal cueing to avoid hip flexion during gait Forward Lunges: 20 reps;Both Forward Lunges Limitations: 10 with 1 LE on 8" box; 10 on flat surface inside parallel bars   Modalities Modalities: Electrical Stimulation Manual Therapy Manual Therapy: Scapular mobilization Myofascial Release: MFR and manual massage to right upper arm and shoulder region to decrease fascial restrictions and pain.  Scapular Mobilization: While on UBE and during wall push ups, mobilized scapula to promote fluid movement. Pharmacologistlectrical Stimulation Electrical Stimulation Location: Anterior tibialis and gastroc/soleus Electrical Stimulation Action: Russian: Dealerplantarflexion/dorsiflexion Electrical Stimulation Parameters: Reciprocal, 10/10 cycle, 100 BPS, 50%, 10 minutes Electrical Stimulation Goals: Neuromuscular facilitation  Physical Therapy Assessment and Plan PT Assessment and Plan Clinical Impression Statement: PTA facilities activities to improve strength, stability and gait mechanics. Pt requires multimodal cueing to encourage RLE activation with lunge activities. Continued with [place]ian electrical stimulation to facilitated plantar flexion and dorsiflexion contraction. More equalized weight shift noted with  ambulation. PT Plan: Continue to progress strength and stability per PT POC. Follow up with referral for AFO Rt ankle    Problem List Patient Active Problem List   Diagnosis Date Noted  . Difficulty in walking(719.7) 03/08/2013  . Right leg weakness 03/08/2013  . Balance problems 03/08/2013  . Weakness of right upper extremity 03/08/2013  . Knee pain, right anterior 02/15/2013  . CVA (cerebral infarction) 02/01/2013  . Gout 08/05/2012  . HTN (hypertension) 08/05/2012    PT - End of Session Equipment Utilized During Treatment: Gait belt Activity Tolerance: Patient tolerated treatment well General Behavior During Therapy: [facility] for tasks assessed/performed  Seth Bakeebekah Erubiel Manasco, PTA  05/16/2013, 5:03 PM

## 2013-05-16 NOTE — Progress Notes (Signed)
Occupational Therapy Treatment Patient Details  Name: Bruce Guerrero MRN: [id number] Date of Birth: 15-Jun-1960  Today's Date: 05/16/2013 Time: 8242-3536 OT Time Calculation (min): 42 min Manual 1435-1445 (10') Neuro Re-ed 1443-1540 (88')  Visit#: 25 of 36  Re-eval: 06/03/13    Authorization: BCBS  Authorization Time Period:    Authorization Visit#:   of    Subjective Symptoms/Limitations Symptoms: S: "Washing the car was so-so - it happened. Motion, I get, just not as much control." Pain Assessment Currently in Pain?: Yes Pain Score: 1  Pain Location: Shoulder Pain Orientation: Right  Precautions/Restrictions     Exercise/Treatments ROM / Strengthening / Isometric Strengthening UBE (Upper Arm Bike): At 1.0, approx 3 minutes each forward and back. Provided facilitation at scapula and elbow for smooth, fluid movements. Reminded pt use BUE rather than leaning and positional assist. Used soft strapping to assist with hand hold. Using RUE only, pt able to comlete 3 cycles of UBE with tapping assist to extend elbow. Cues for pt to breathe.   Neurological Re-education Weight Bearing Exercises Standing with weight shifting on and off: Table and wall push ups (in flexion and abduction) - 10 reps each - cueing to push weight through RUE rather than compensating with learning/body positioning. Supported hand and elbow to optimum positioning and weightbearing.   Manual Therapy Manual Therapy: Scapular mobilization Myofascial Release: MFR and manual massage to right upper arm and shoulder region to decrease fascial restrictions and pain.  Scapular Mobilization: While on UBE and during wall push ups, mobilized scapula to promote fluid movement.  Occupational Therapy Assessment and Plan OT Assessment and Plan Clinical Impression Statement: A: Pt tolerated well talbe and wall push ups - had good input into RUE. Pt tolerated well UBE exercises - pt indiated that stiffness/pain in right  shoulder decreased with repeated motion. OT Plan: P: Facilitate wall push ups. Facilitation of decreased lateral flexion of trunk. Attempt UBE with e-stim for elbow extension.   Goals Short Term Goals Short Term Goal 1: Pt will be educated on HEP. Short Term Goal 1 Progress: Met Short Term Goal 2: Pt will be educated on use and wear of hand splint to decrease flexor tone. Short Term Goal 2 Progress: Met Short Term Goal 3: Pt will increase use of RUE to engage in AAROM in daily activities, 25% of the time. Short Term Goal 3 Progress: Met Short Term Goal 4: Pt will increase AROM in right shoulder to 25 degress of flexion to improve use of RUE in ADLs. Short Term Goal 4 Progress: Met Short Term Goal 5: Pt will don RLE AFO and shoe with modified independence Short Term Goal 5 Progress: Discontinued (comment) Short Term Goal 6: Pt will tolerate weightbearing on RUE for 1 minute to improve use of RUE in ADLs Short Term Goal 6 Progress: Met Short Term Goal 7: Pt will tolerate wear of hand splint for 6 hours per day, with no signs/symptoms of skin breakdown Short Term Goal 7 Progress: Met Long Term Goals Long Term Goal 1: Pt will increase use of RUE to engage in AROM in daily activities, 50% of the time. Long Term Goal 1 Progress: Progressing toward goal Long Term Goal 2: Pt will increase AROM in right shoulder to 45 degress of flexion to improve use of RUE in ADLs. Long Term Goal 2 Progress: Progressing toward goal Long Term Goal 3: Pt will tolerate weightbearing on RUE for 5 minutes to improve use of RUE in ADLs Long Term Goal 3  Progress: Met Long Term Goal 4: Pt will attain 3+/5 strength in RUE to engage in daily acticities w RUE. Long Term Goal 4 Progress: Progressing toward goal Long Term Goal 5: Pt will don/doff hand splint w modified independence. Long Term Goal 5 Progress: Met  Problem List Patient Active Problem List   Diagnosis Date Noted  . Difficulty in walking(719.7) 03/08/2013   . Right leg weakness 03/08/2013  . Balance problems 03/08/2013  . Weakness of right upper extremity 03/08/2013  . Knee pain, right anterior 02/15/2013  . CVA (cerebral infarction) 02/01/2013  . Gout 08/05/2012  . HTN (hypertension) 08/05/2012    End of Session Activity Tolerance: Patient tolerated treatment well General Behavior During Therapy: [facility] for tasks assessed/performed  GO    Bea Graff, [place], OTR/L [phone]  05/16/2013, 4:29 PM

## 2013-05-18 ENCOUNTER — Ambulatory Visit (HOSPITAL_COMMUNITY)
Admission: RE | Admit: 2013-05-18 | Discharge: 2013-05-18 | Disposition: A | Discharge status: Home / Self Care | Payer: BC Managed Care – PPO | Source: Ambulatory Visit | Attending: Family Medicine | Admitting: Family Medicine

## 2013-05-18 NOTE — Progress Notes (Signed)
Occupational Therapy Treatment Patient Details  Name: Bruce Guerrero MRN: [id number] Date of Birth: Jul 25, 1960  Today's Date: 05/18/2013 Time: 4098-1191 OT Time Calculation (min): 40 min Manual 1437-1447 (10') Neuro-Re-ed 4782-9562 (15') E-stim 1308-6578 (15')  Visit#: 26 of 36  Re-eval: 06/03/13    Authorization: BCBS  Authorization Time Period:    Authorization Visit#:   of    Subjective Symptoms/Limitations Symptoms: S: Just stiff, pretty much.  Pain Assessment Currently in Pain?: Yes Pain Score: 1  Pain Location: Shoulder Pain Orientation: Right  Precautions/Restrictions     Exercise/Treatments Neurological Re-education Exercises Shoulder Flexion: Seated;AAROM;5 reps (x2, with facilitation at scapula and elbow) Shoulder Protraction: Seated;AAROM;5 reps (with faciliation at scapula and elbow)  Weight Bearing Exercises Seated with weight on hand: seated EOM with wrist and elbow extended, while shifting seated weight left and right  Development of Reach  Reaching to Shoulder Height: At UBE, to encourage elbow extension and shoulder flexion/protraction. E-stim to promote elbow extension and manual facilitation at shoulder. Pt pushed left side of UBE for assist.     Manual Therapy Manual Therapy: Myofascial release Myofascial Release: MFR and manual massage to right upper arm and shoulder region to decrease fascial restrictions and pain.   Scapular Mobilization: While on UBE and during seated shoulder AAROM, mobilized scapula to promote fluide movement. Gaffer Location: right tricep region Printmaker Action: Designer, jewellery Parameters: Single cycle, 5/5, 100 BPS, 10 minutes Weight Bearing Technique Weight Bearing Technique: Yes RUE Weight Bearing Technique: Extended arm seated Response to Weight Bearing Technique: Relaxation of flexor tone in hand  Occupational Therapy Assessment and Plan OT  Assessment and Plan Clinical Impression Statement: A: Pt tolerated well UBE exercises - pt had moderate success with tricep extension e-stim during cycling. OT Plan: P: Facilitate wall push ups. Facilitation of decreased lateral flexion of trunk. Re-attempt UBE with e-stim for elbow extension.   Goals Short Term Goals Short Term Goal 1: Pt will be educated on HEP. Short Term Goal 1 Progress: Met Short Term Goal 2: Pt will be educated on use and wear of hand splint to decrease flexor tone. Short Term Goal 2 Progress: Met Short Term Goal 3: Pt will increase use of RUE to engage in AAROM in daily activities, 25% of the time. Short Term Goal 3 Progress: Met Short Term Goal 4: Pt will increase AROM in right shoulder to 25 degress of flexion to improve use of RUE in ADLs. Short Term Goal 4 Progress: Met Short Term Goal 5: Pt will don RLE AFO and shoe with modified independence Short Term Goal 5 Progress: Discontinued (comment) Short Term Goal 6: Pt will tolerate weightbearing on RUE for 1 minute to improve use of RUE in ADLs Short Term Goal 6 Progress: Met Short Term Goal 7: Pt will tolerate wear of hand splint for 6 hours per day, with no signs/symptoms of skin breakdown Short Term Goal 7 Progress: Met Long Term Goals Long Term Goal 1: Pt will increase use of RUE to engage in AROM in daily activities, 50% of the time. Long Term Goal 1 Progress: Progressing toward goal Long Term Goal 2: Pt will increase AROM in right shoulder to 45 degress of flexion to improve use of RUE in ADLs. Long Term Goal 2 Progress: Progressing toward goal Long Term Goal 3: Pt will tolerate weightbearing on RUE for 5 minutes to improve use of RUE in ADLs Long Term Goal 3 Progress: Met Long Term Goal 4: Pt will attain  3+/5 strength in RUE to engage in daily acticities w RUE. Long Term Goal 4 Progress: Progressing toward goal Long Term Goal 5: Pt will don/doff hand splint w modified independence. Long Term Goal 5  Progress: Met  Problem List Patient Active Problem List   Diagnosis Date Noted  . Difficulty in walking(719.7) 03/08/2013  . Right leg weakness 03/08/2013  . Balance problems 03/08/2013  . Weakness of right upper extremity 03/08/2013  . Knee pain, right anterior 02/15/2013  . CVA (cerebral infarction) 02/01/2013  . Gout 08/05/2012  . HTN (hypertension) 08/05/2012    End of Session Activity Tolerance: Patient tolerated treatment well General Behavior During Therapy: [facility] for tasks assessed/performed  GO    Bea Graff, MS, OTR/L [phone]  05/18/2013, 4:42 PM

## 2013-05-18 NOTE — Progress Notes (Signed)
Physical Therapy Treatment Patient Details  Name: Bruce Guerrero MRN: [id number] Date of Birth: December 03, 1960  Today's Date: 05/18/2013 Time: 9562-13081518-1600 PT Time Calculation (min): 42 min Charges: Gait x 25'(1520-1545) Estim x 1(1550-1600)   Visit#: 27 of 35  Re-eval: 06/08/13  Authorization: BCBS    Subjective: Pain Assessment Currently in Pain?: Yes Pain Score: 1  Pain Location: Shoulder Pain Orientation: Right  Exercise/Treatments Standing Heel Raises: 20 reps;Limitations Heel Raises Limitations: varied stances Knee Flexion: 10 reps;Limitations Knee Flexion Limitations: Active without manual facilitation  Forward Lunges: 10 reps;Both Forward Lunges Limitations: On 8" box Other Standing Knee Exercises: Anterior/posterior weight shift encouraging right plantarflexion Other Standing Knee Exercises: Bring right foot from back to front with manual facilitating for knee flexion   Modalities Modalities: Electrical Stimulation Manual Therapy Manual Therapy: Myofascial release Myofascial Release: MFR and manual massage to right upper arm and shoulder region to decrease fascial restrictions and pain.   Scapular Mobilization: While on UBE and during seated shoulder AAROM, mobilized scapula to promote fluide movement. Pharmacologistlectrical Stimulation Electrical Stimulation Location: Anterior tibialis and Dietitiangastroc/soleus Electrical Stimulation Action: Doctor, hospitalussian Electrical Stimulation Parameters: Reciprocal, 10/10 duty cycle, 100 BPS, 10'  Electrical Stimulation Goals: Neuromuscular facilitation Weight Bearing Technique Weight Bearing Technique: Yes RUE Weight Bearing Technique: Extended arm seated Response to Weight Bearing Technique: Relaxation of flexor tone in hand  Physical Therapy Assessment and Plan PT Assessment and Plan Clinical Impression Statement: Treatment focus on improving ankle and hamstring strength and improving gait mechanics. Pre-gait activities completed to improve right  plantarflexion with toe off as well as right knee flexion with swing through. Pt continues to respond well to [place] electrical stimulation to calf and tibialis anterior.  PT Plan: Continue to progress right lower extremity strength, balance and gait mechanics per PT POC. Follow up regarding AFO order.     Problem List Patient Active Problem List   Diagnosis Date Noted  . Difficulty in walking(719.7) 03/08/2013  . Right leg weakness 03/08/2013  . Balance problems 03/08/2013  . Weakness of right upper extremity 03/08/2013  . Knee pain, right anterior 02/15/2013  . CVA (cerebral infarction) 02/01/2013  . Gout 08/05/2012  . HTN (hypertension) 08/05/2012    PT - End of Session Equipment Utilized During Treatment: Gait belt Activity Tolerance: Patient tolerated treatment well General Behavior During Therapy: [facility] for tasks assessed/performed  Seth Bakeebekah Jahan Friedlander, PTA  05/18/2013, 5:18 PM

## 2013-05-20 ENCOUNTER — Ambulatory Visit (HOSPITAL_COMMUNITY)
Admission: RE | Admit: 2013-05-20 | Discharge: 2013-05-20 | Disposition: A | Discharge status: Home / Self Care | Payer: BC Managed Care – PPO | Source: Ambulatory Visit | Attending: Family Medicine | Admitting: Family Medicine

## 2013-05-20 DIAGNOSIS — R262 Difficulty in walking, not elsewhere classified: Secondary | ICD-10-CM

## 2013-05-20 DIAGNOSIS — R2689 Other abnormalities of gait and mobility: Secondary | ICD-10-CM

## 2013-05-20 DIAGNOSIS — R29898 Other symptoms and signs involving the musculoskeletal system: Secondary | ICD-10-CM

## 2013-05-20 NOTE — Progress Notes (Signed)
Physical Therapy Treatment Patient Details  Name: Bruce Guerrero MRN: [id number] Date of Birth: [birth year]  Today's Date: 05/20/2013 Time: 2130-86571428-1515 PT Time Calculation (min): 47 min Charge TE 1428-1500, Gait 1500-1515  Visit#: 28 of 35  Re-eval: 06/08/13 Assessment Diagnosis: CVA  Next MD Visit: Doroteo BradfordKirstein 05/23/2013 Prior Therapy: Inpt rehab.  Authorization: BCBS   Authorization Time Period:    Authorization Visit#:   of     Subjective: Symptoms/Limitations Symptoms: Pt stated he has increased activity tolerance for functional tasks around home, able to make bed, complete dishes with no help now with no rest breaks Pain Assessment Currently in Pain?: Yes Pain Score: 1  Pain Location: Shoulder Pain Orientation: Right Pain Type: Chronic pain  Precautions/Restrictions  Precautions Precautions: Fall Restrictions Weight Bearing Restrictions: No  Exercise/Treatments Aerobic Stationary Bike: nustep  hill level #5, resistance L5 x 10:00 SPM 132 Standing Heel Raises: 20 reps;Limitations Heel Raises Limitations: varied stances Knee Flexion: Right;15 reps Knee Flexion Limitations: Active without manual facilitation  Forward Lunges: 10 reps;Both Forward Lunges Limitations:    Gait Training: No AD gait training with focus on heel stride and knee flexion; wooden gait ladder 3RT with min assistance and main focus on knee flexion and reduction of circumduction. Seated Other Seated Knee Exercises: Ankle dorsi/plantar flexion 3 sets x 10  Physical Therapy Assessment and Plan PT Assessment and Plan Clinical Impression Statement: Session focus on improving hamstring and gastrocnemius strengthening to improve gait mechanis.  Pre-gait activtiies complete to improve right plantarflexion with toe off with improve hamstring strength noted during swing phase.  Added wooden gait ladder to improve knee flexion during gait and reduuction of circumduction with improved gait mechanics noted.   Pt limited by fatigue, no reports of pain.   PT Plan: Continue to progress right lower extremity strength, balance and gait mechanics per PT POC. Follow up regarding AFO order.     Goals PT Short Term Goals PT Short Term Goal 1: Pt to be able to stand for 20 minutes to socialize PT Short Term Goal 1 - Progress: Progressing toward goal PT Short Term Goal 2: Pt to be able to walk with QC inside x 15 minutes. PT Short Term Goal 3: Pt strength to be increased 1/2 grade to allow the above to occur. PT Short Term Goal 3 - Progress: Progressing toward goal PT Short Term Goal 4: Pt Berg to be increased 7 pts to improve safety of ambulation. PT Long Term Goals PT Long Term Goal 1: Pt to be I in advance HEP PT Long Term Goal 2: PT to be able to walk outside with cane x 30 mintues Long Term Goal 3: Pt to be able to walk inside without an assistive device Long Term Goal 3 Progress: Progressing toward goal Long Term Goal 4: Pt to berg score to increase by 14 points to improve safety of ambulation  Problem List Patient Active Problem List   Diagnosis Date Noted  . Difficulty in walking(719.7) 03/08/2013  . Right leg weakness 03/08/2013  . Balance problems 03/08/2013  . Weakness of right upper extremity 03/08/2013  . Knee pain, right anterior 02/15/2013  . CVA (cerebral infarction) 02/01/2013  . Gout 08/05/2012  . HTN (hypertension) 08/05/2012    PT - End of Session Equipment Utilized During Treatment: Gait belt Activity Tolerance: Patient tolerated treatment well General Behavior During Therapy: [facility] for tasks assessed/performed  GP    Juel BurrowCockerham, Keeya Dyckman Jo 05/20/2013, 5:34 PM

## 2013-05-20 NOTE — Progress Notes (Signed)
Occupational Therapy Treatment Patient Details  Name: Bruce Guerrero MRN: [id number] Date of Birth: 1960-10-27  Today's Date: 05/20/2013 Time: 1320-1405 OT Time Calculation (min): 45 min Manual 1320-1330 (10') Neuro Re-Ed 1330-1350 (20') E-Stim 1350-1405 (15')  Visit#: 27 of 36  Re-eval: 06/03/13    Authorization: BCBS  Authorization Time Period:    Authorization Visit#:   of    Subjective Symptoms/Limitations Symptoms: S: "Nothing new to report." Pain Assessment Currently in Pain?: Yes Pain Score: 1  Pain Location: Shoulder Pain Orientation: Right Pain Type: Chronic pain  Precautions/Restrictions  Precautions Precautions: Fall Restrictions Weight Bearing Restrictions: No  Exercise/Treatments  Neurological Re-education    Weight Bearing Exercises Seated with weight on hand: Seated at table, with elbow bent, weightbearing through both elbow and forearm intermittently while discussing weightbearing options and setting up of e-stim.  Development of Reach  Hand over Hand Assistance while Reaching: with e-stim for digit extension, facilitated pt protracting shoulder and extending elbow in order to grasp and release kitchen objects and sponges. pt required cues for decreasing postural lean while reaching.  Grasp and Release Thumb Opposition: with estim for digit extension, pt was able to grasp and release 8 small sopnges with 1st and 2nd digits picking them up from his left and placing them into a tub to his right. Pt also had moderate success using e-stim to grasp and release kitchen objects (pudding box, spice jar).  Fine Motor Coordination      Modalities Modalities: Electrical Stimulation Manual Therapy Manual Therapy: Myofascial release Myofascial Release: MFR and manual massage to right upper arm and shoulder region to decrease fascial restrictions and pain Scapular Mobilization: During functional reaching exercises, mobilized scapula to promote fluid shoulder  reaching movement Electrical Stimulation Electrical Stimulation Location: digit extensors (right) Electrical Stimulation Action: Designer, jewellery Parameters: Single, 5/5 cycle, 100 BPS, 15 minutes Electrical Stimulation Goals: Neuromuscular facilitation Weight Bearing Technique RUE Weight Bearing Technique: Forearm seated Response to Weight Bearing Technique: Input to right shoulder Activities of Daily Living Activities of Daily Living: Pt had questions about weightbearing options and finger grasp exercises. Educated pt on weightbearing options other than elbow extended in order to promote further input into right shoulder. Proivded pt with weightbearing handout. Also educated pt on importance of working towards digit extension along with digit flexion - pt verbalized understanding,  Occupational Therapy Assessment and Plan OT Assessment and Plan Clinical Impression Statement: A: Pt had good response to e-stim for digit extension this date, in combination with functional reaching. he required multiple cues for using shoulder rather than leaning or turning in order to reach and place objects, but was able to complete tasks as requested with said cueing. OT Plan: P: Facilitate wall push ups. Facilitation of decreased lateral flexion of trunk. Re-attempt UBE with e-stim for elbow extension.   Goals Short Term Goals Short Term Goal 1: Pt will be educated on HEP. Short Term Goal 1 Progress: Met Short Term Goal 2: Pt will be educated on use and wear of hand splint to decrease flexor tone. Short Term Goal 2 Progress: Met Short Term Goal 3: Pt will increase use of RUE to engage in AAROM in daily activities, 25% of the time. Short Term Goal 3 Progress: Met Short Term Goal 4: Pt will increase AROM in right shoulder to 25 degress of flexion to improve use of RUE in ADLs. Short Term Goal 4 Progress: Met Short Term Goal 5: Pt will don RLE AFO and shoe with modified independence Short  Term Goal 5 Progress: Discontinued (comment) Short Term Goal 6: Pt will tolerate weightbearing on RUE for 1 minute to improve use of RUE in ADLs Short Term Goal 6 Progress: Met Short Term Goal 7: Pt will tolerate wear of hand splint for 6 hours per day, with no signs/symptoms of skin breakdown Short Term Goal 7 Progress: Met Long Term Goals Long Term Goal 1: Pt will increase use of RUE to engage in AROM in daily activities, 50% of the time. Long Term Goal 1 Progress: Progressing toward goal Long Term Goal 2: Pt will increase AROM in right shoulder to 45 degress of flexion to improve use of RUE in ADLs. Long Term Goal 2 Progress: Progressing toward goal Long Term Goal 3: Pt will tolerate weightbearing on RUE for 5 minutes to improve use of RUE in ADLs Long Term Goal 3 Progress: Met Long Term Goal 4: Pt will attain 3+/5 strength in RUE to engage in daily acticities w RUE. Long Term Goal 4 Progress: Progressing toward goal Long Term Goal 5: Pt will don/doff hand splint w modified independence. Long Term Goal 5 Progress: Met  Problem List Patient Active Problem List   Diagnosis Date Noted  . Difficulty in walking(719.7) 03/08/2013  . Right leg weakness 03/08/2013  . Balance problems 03/08/2013  . Weakness of right upper extremity 03/08/2013  . Knee pain, right anterior 02/15/2013  . CVA (cerebral infarction) 02/01/2013  . Gout 08/05/2012  . HTN (hypertension) 08/05/2012    End of Session Activity Tolerance: Patient tolerated treatment well General Behavior During Therapy: [facility] for tasks assessed/performed  GO    Bea Graff, MS, OTR/L [phone]  05/20/2013, 4:19 PM

## 2013-05-23 ENCOUNTER — Ambulatory Visit (HOSPITAL_COMMUNITY)
Admission: RE | Admit: 2013-05-23 | Discharge: 2013-05-23 | Disposition: A | Discharge status: Home / Self Care | Payer: BC Managed Care – PPO | Source: Ambulatory Visit | Attending: Family Medicine | Admitting: Family Medicine

## 2013-05-23 ENCOUNTER — Ambulatory Visit (HOSPITAL_BASED_OUTPATIENT_CLINIC_OR_DEPARTMENT_OTHER): Payer: BC Managed Care – PPO | Admitting: Physical Medicine & Rehabilitation

## 2013-05-23 ENCOUNTER — Ambulatory Visit (HOSPITAL_COMMUNITY): Payer: BC Managed Care – PPO | Admitting: Specialist

## 2013-05-23 ENCOUNTER — Encounter: Payer: BC Managed Care – PPO | Attending: Physical Medicine & Rehabilitation

## 2013-05-23 ENCOUNTER — Encounter: Payer: Self-pay | Admitting: Physical Medicine & Rehabilitation

## 2013-05-23 VITALS — BP 151/76 | HR 82 | Resp 14 | Ht 64.0 in | Wt 188.0 lb

## 2013-05-23 DIAGNOSIS — I69959 Hemiplegia and hemiparesis following unspecified cerebrovascular disease affecting unspecified side: Secondary | ICD-10-CM | POA: Insufficient documentation

## 2013-05-23 DIAGNOSIS — R2689 Other abnormalities of gait and mobility: Secondary | ICD-10-CM

## 2013-05-23 DIAGNOSIS — I10 Essential (primary) hypertension: Secondary | ICD-10-CM | POA: Insufficient documentation

## 2013-05-23 DIAGNOSIS — R29898 Other symptoms and signs involving the musculoskeletal system: Secondary | ICD-10-CM

## 2013-05-23 DIAGNOSIS — I6350 Cerebral infarction due to unspecified occlusion or stenosis of unspecified cerebral artery: Secondary | ICD-10-CM

## 2013-05-23 DIAGNOSIS — G8110 Spastic hemiplegia affecting unspecified side: Secondary | ICD-10-CM | POA: Insufficient documentation

## 2013-05-23 DIAGNOSIS — I639 Cerebral infarction, unspecified: Secondary | ICD-10-CM

## 2013-05-23 DIAGNOSIS — R262 Difficulty in walking, not elsewhere classified: Secondary | ICD-10-CM

## 2013-05-23 NOTE — Progress Notes (Signed)
Subjective:    Patient ID: Bruce Guerrero, male    DOB: 24-Apr-1960, 53 y.o.   MRN: [id number] 53 year old right-handed male with history of hypertension,  independent prior to admission, working full-time. Admitted on February 01, 2013, with right-sided weakness and slurred speech. MRI of the  brain showed a moderately enlarged deep white matter infarction on the  left. MRA of the head with no large vessel occlusion identified.  Carotid Dopplers with no ICA stenosis. Echocardiogram with ejection  fraction of 60% and grade 1 diastolic dysfunction. The patient did not  receive tPA. Neurology Service was consulted, maintained on Plavix,  aspirin therapy, and later just Plavix alone  DATE OF ADMISSION: 02/08/2013  DATE OF DISCHARGE: 02/26/2013  HPI [facility] and OT [facility] 3 times per week. Review therapy notes. Making improvements with motor function. Hip flexion hip abduction hip adduction knee flexion knee extension ankle dorsiflexors and and ankle inversion in all improved compared to prior  Berg balance test is improving by 14 points.  Independent with bathing and dressing with the exception of buttoning pants. Using cane to ambulate. No falls at home  Pain Inventory Average Pain 1 Pain Right Now 0 My pain is intermittent  In the last 24 hours, has pain interfered with the following? General activity 0 Relation with others 0 Enjoyment of life 0 What TIME of day is your pain at its worst? night Sleep (in general) Fair  Pain is worse with: other Pain improves with: rest Relief from Meds: no pain meds  Mobility walk with assistance use a cane ability to climb steps?  yes do you drive?  no transfers alone  Function not employed: date last employed na Do you have any goals in this area?  yes  Neuro/Psych No problems in this area  Prior Studies Any changes since last visit?  no  Physicians involved in your care Any changes since last visit?   no   Family History  Problem Relation Age of Onset  . COPD Mother   . COPD Father   . Cancer Sister   . Hyperlipidemia Brother   . Diabetes Brother    History   Social History  . Marital Status: Single    Spouse Name: N/A    Number of Children: N/A  . Years of Education: N/A   Social History Main Topics  . Smoking status: Never Smoker   . Smokeless tobacco: None  . Alcohol Use: No  . Drug Use: No  . Sexual Activity: None   Other Topics Concern  . None   Social History Narrative  . None   History reviewed. No pertinent past surgical history. Past Medical History  Diagnosis Date  . Gout   . Hypertension   . Stroke    BP 151/76  Pulse 82  Resp 14  Ht 5\' 4"  (1.626 m)  Wt 188 lb (85.276 kg)  BMI 32.25 kg/m2  SpO2 97%  Opioid Risk Score:   Fall Risk Score: Moderate Fall Risk (6-13 points) (pt educated on fall risk, brochure given to pt)   Review of Systems  All other systems reviewed and are negative.       Objective:   Physical Exam  2 minus finger flexors, 3 minus elbow flexors and elbow extensors, 3 minus right deltoid 3 minus right hip extensor and knee extensor, 2 minus right ankle dorsiflexor and plantar flexor. Sensation normal to light touch  Speech without dysarthria  Tone Ashworth grade 2  in the right finger flexors and wrist flexors, 2 in the right biceps      Assessment & Plan:  1. Deep white matter infarct left onset 02/01/2013. At this point recommend no driving still will discuss again next visit. We discussed spasticity management including Botox injection which we will not do today. If spasticity gets worse again may consider  Continued outpatient PT and OT.  We discussed return to work. Patient is unable to go back to work at this point. He does not have any functional use of his dominant right hand. He needs to use the left hand for a cane when he is walking. Therefore unable to carry any objects. Also at this point not ready  for driving Over half of the 25 min visit was spent counseling and coordinating care. Return to clinic 4- 6 weeks

## 2013-05-23 NOTE — Patient Instructions (Signed)
Continued outpatient PT and OT. If right hand spasticity increases would recommend Botox

## 2013-05-23 NOTE — Progress Notes (Signed)
Physical Therapy Treatment Patient Details  Name: Bruce Guerrero MRN: [id number] Date of Birth: 1961/01/10  Today's Date: 05/23/2013 Time: 4098-1191 PT Time Calculation (min): 45 min Charge [place] Estim 4782-9562, Gait 1308-6578  Visit#: 29 of 35  Re-eval: 06/08/13 Assessment Diagnosis: CVA  Next MD Visit: Doroteo Bradford 05/23/2013 Prior Therapy: Inpt rehab.  Authorization: BCBS   Authorization Time Period:    Authorization Visit#:   of     Subjective: Symptoms/Limitations Symptoms: Pt stated pain free. Pain Assessment Currently in Pain?: No/denies Pain Score: 1  Pain Location: Shoulder Pain Orientation: Right Pain Type: Chronic pain  Precautions/Restrictions  Precautions Precautions: Fall Restrictions Weight Bearing Restrictions: No  Exercise/Treatments Aerobic Elliptical: 3' with therapist facilitation  Standing Heel Raises: 20 reps;Limitations Heel Raises Limitations: varied stances Knee Flexion: Right;15 reps Knee Flexion Limitations: Active without manual facilitation  Forward Lunges: 10 reps;Both Functional Squat: 20 reps;Limitations Functional Squat Limitations: deep with Rt LE behind Gait Training: No AD gait training with focus on heel stride and knee flexion; wooden gait ladder 3RT with min assistance and main focus on knee flexion and reduction of circumduction. Seated Other Seated Knee Exercises: Ankle dorsi/plantar flexion 3 sets x 10   Modalities Modalities: Copywriter, advertising Location: Rt anterior tibalis and gastrocnemius/soleus Electrical Stimulation Action: Doctor, hospital Parameters: Reciprocal, 10/10, Range from 57-66 BPS, 10 minutes Electrical Stimulation Goals: Neuromuscular facilitation  Physical Therapy Assessment and Plan PT Assessment and Plan Clinical Impression Statement: Resumed russian estim using reciprocal inhibition, noted good contraction with anterior  tibaliis; brief contraction with gastrocnemius activation with noted hamstring activation as well.  Pt able to complete partial heel raise actively with less weight bearing for Rt LE.  Pre gait activiteis complete to improve knee flexion with multimodal cueing for heel strike, knee flexion and to increase weight bearing Rt LE.  Added elliptical to stimulate Rt hip and knee flexion to improve reciprocal pattern with gait.   PT Plan: Continue to progress right lower extremity strength, balance and gait mechanics per PT POC. Follow up regarding AFO order.     Goals PT Short Term Goals PT Short Term Goal 1: Pt to be able to stand for 20 minutes to socialize PT Short Term Goal 1 - Progress: Progressing toward goal PT Short Term Goal 2: Pt to be able to walk with QC inside x 15 minutes. PT Short Term Goal 3: Pt strength to be increased 1/2 grade to allow the above to occur. PT Short Term Goal 3 - Progress: Progressing toward goal PT Short Term Goal 4: Pt Berg to be increased 7 pts to improve safety of ambulation. PT Long Term Goals PT Long Term Goal 1: Pt to be I in advance HEP PT Long Term Goal 2: PT to be able to walk outside with cane x 30 mintues Long Term Goal 3: Pt to be able to walk inside without an assistive device Long Term Goal 3 Progress: Progressing toward goal Long Term Goal 4: Pt to berg score to increase by 14 points to improve safety of ambulation Long Term Goal 4 Progress: Progressing toward goal  Problem List Patient Active Problem List   Diagnosis Date Noted  . Difficulty in walking(719.7) 03/08/2013  . Right leg weakness 03/08/2013  . Balance problems 03/08/2013  . Weakness of right upper extremity 03/08/2013  . Knee pain, right anterior 02/15/2013  . CVA (cerebral infarction) 02/01/2013  . Gout 08/05/2012  . HTN (hypertension) 08/05/2012    PT - End of  Session Activity Tolerance: Patient tolerated treatment well General Behavior During Therapy: [facility] for tasks  assessed/performed  GP    Juel BurrowCockerham, Randilyn Foisy Jo 05/23/2013, 10:38 AM

## 2013-05-23 NOTE — Progress Notes (Signed)
Occupational Therapy Treatment Patient Details  Name: Bruce Guerrero MRN: [id number] Date of Birth: 29-Dec-1960  Today's Date: 05/23/2013 Time: 1962-2297 OT Time Calculation (min): 46 min Neuro Re-Ed 845-911 (26') E-Stim 911-931 (20')  Visit#: 28 of 79  Re-eval: 06/03/13    Authorization: BCBS  Authorization Time Period:    Authorization Visit#:   of    Subjective Symptoms/Limitations Symptoms: S: "Shoulder a little bit (pain), but really just stiff." Pain Assessment Currently in Pain?: No/denies Pain Score: 1  Pain Location: Shoulder Pain Orientation: Right Pain Type: Chronic pain  Precautions/Restrictions  Precautions Precautions: Fall Restrictions Weight Bearing Restrictions: No  Exercise/Treatments  ROM / Strengthening / Isometric Strengthening UBE (Upper Arm Bike): At 1.0, for approximately 10 minutes. provided facilitation and scapula and elbow for smooth fluid movements. E-stim for elbow extension.   Neurological Re-education Exercises Shoulder Flexion: Seated;10 reps;AAROM Shoulder Protraction: Seated;AROM;10 reps Elbow Flexion: Seated;AROM;10 reps (x2) Elbow Extension: Seated;AROM;10 reps (x2)  Weight Bearing Exercises Seated with weight on hand: Seated EOM, pushing through elbow, forearm, wrist, and hand to transfer to sidelying and back to sitting. Facilitation for maintaining hand position on mat.  Development of Reach  Reaching to Shoulder Height: At UBE, to encourage elbow extension and shoulder flexion/protraction. E-stim to promote elbow extension and manual facilitation at shoulder. Pt pushed left side of UBE for assist.   Modalities Modalities: Systems analyst Stimulation Location: right tricep for digit extension Electrical Stimulation Action: russion Electrical Stimulation Parameters: Single, 5/5, 100 BPS, 42, two 10-minute session Electrical Stimulation Goals: Neuromuscular facilitation Weight  Bearing Technique Weight Bearing Technique: Yes RUE Weight Bearing Technique: Forearm seated;Extended arm seated Response to Weight Bearing Technique: Input to RUE and functional use of RUE in transitioning from sidelying to sitting  Occupational Therapy Assessment and Plan OT Assessment and Plan Clinical Impression Statement: A: Pt tolerated well practice using RUE to transition from sideyling to sitting and vice versa. pt demonstrated increased ability to utilize RUE during transfer, but required assist maintaing hand positon on mat for transitional weightbearing.  Pt had good response to e-stim for elbow extension, with seated with AAROM for shoulder protraction and at UBE. OT Plan: P: Facilitat wall push ups. facilitatio of decreased lateral flexion of trunk with reaching.   Goals Short Term Goals Short Term Goal 1: Pt will be educated on HEP. Short Term Goal 1 Progress: Met Short Term Goal 2: Pt will be educated on use and wear of hand splint to decrease flexor tone. Short Term Goal 2 Progress: Met Short Term Goal 3: Pt will increase use of RUE to engage in AAROM in daily activities, 25% of the time. Short Term Goal 3 Progress: Met Short Term Goal 4: Pt will increase AROM in right shoulder to 25 degress of flexion to improve use of RUE in ADLs. Short Term Goal 4 Progress: Met Short Term Goal 5: Pt will don RLE AFO and shoe with modified independence Short Term Goal 5 Progress: Discontinued (comment) Short Term Goal 6: Pt will tolerate weightbearing on RUE for 1 minute to improve use of RUE in ADLs Short Term Goal 6 Progress: Met Short Term Goal 7: Pt will tolerate wear of hand splint for 6 hours per day, with no signs/symptoms of skin breakdown Short Term Goal 7 Progress: Met Long Term Goals Long Term Goal 1: Pt will increase use of RUE to engage in AROM in daily activities, 50% of the time. Long Term Goal 1 Progress: Progressing toward goal Long Term  Goal 2: Pt will increase AROM  in right shoulder to 45 degress of flexion to improve use of RUE in ADLs. Long Term Goal 2 Progress: Progressing toward goal Long Term Goal 3: Pt will tolerate weightbearing on RUE for 5 minutes to improve use of RUE in ADLs Long Term Goal 3 Progress: Met Long Term Goal 4: Pt will attain 3+/5 strength in RUE to engage in daily acticities w RUE. Long Term Goal 4 Progress: Progressing toward goal Long Term Goal 5: Pt will don/doff hand splint w modified independence. Long Term Goal 5 Progress: Met  Problem List Patient Active Problem List   Diagnosis Date Noted  . Difficulty in walking(719.7) 03/08/2013  . Right leg weakness 03/08/2013  . Balance problems 03/08/2013  . Weakness of right upper extremity 03/08/2013  . Knee pain, right anterior 02/15/2013  . CVA (cerebral infarction) 02/01/2013  . Gout 08/05/2012  . HTN (hypertension) 08/05/2012    End of Session Activity Tolerance: Patient tolerated treatment well General Behavior During Therapy: [facility] for tasks assessed/performed  GO    Bea Graff, MS, OTR/L [phone]  05/23/2013, 12:33 PM

## 2013-05-25 ENCOUNTER — Ambulatory Visit (HOSPITAL_COMMUNITY)
Admission: RE | Admit: 2013-05-25 | Discharge: 2013-05-25 | Disposition: A | Discharge status: Home / Self Care | Payer: BC Managed Care – PPO | Source: Ambulatory Visit | Attending: Family Medicine | Admitting: Family Medicine

## 2013-05-25 NOTE — Progress Notes (Signed)
Occupational Therapy Treatment Patient Details  Name: Bruce Guerrero MRN: [id number] Date of Birth: [birth year]  Today's Date: 05/25/2013 Time: 6088-8358 OT Time Calculation (min): 40 min Manual 1437-1450 (13') Neuro Re-Ed 1450-1415 (25')  Visit#: 29 of 36  Re-eval: 06/03/13    Authorization: BCBS  Authorization Time Period:    Authorization Visit#:   of    Subjective Symptoms/Limitations Symptoms: S: Its sore today, I've done a lot of weightbearing. And I think I slept on it wrong last night." Pain Assessment Currently in Pain?: Yes Pain Score: 2  Pain Location: Shoulder Pain Orientation: Right Pain Type: Chronic pain  Precautions/Restrictions     Exercise/Treatments Neurological Re-education Exercises Shoulder Flexion: PROM;10 reps Elbow Flexion: PROM;10 reps Elbow Extension: PROM;10 reps Forearm Supination: PROM;10 reps Forearm Pronation: PROM;10 reps Wrist Flexion: PROM;10 reps;AROM Wrist Extension: PROM;15 reps;AROM;5 reps  Weight Bearing Exercises   Weightbearing completed on extended RUE in between grasp release exercises Development of Reach  Closed Chain Exercises: table pushups in standing - 10 reps and facilitation at elbow  Grasp and Release Thumb Opposition: pt flexed shoulder forward and extendced/felxed thumb to grasp 14 cards, horizontally abudcted to place cards on table with cues for decreased laterl lean and tapping for elbo extension. Pt released 14 cards wiith thumb AROM abduction  Fine Motor Coordination       Manual Therapy Manual Therapy: Myofascial release Myofascial Release: MFR and manual massage to right upper arm and shoulder region to decrease fascial restrictions and pain  Occupational Therapy Assessment and Plan OT Assessment and Plan Clinical Impression Statement: Pt reports using RUE more in daily tasks. Pt presents with increased thumb abduction. He was able to grasp and release approximately five times (with card) before  requiring weightbearing. OT Plan: P: Facilitate wall push ups. Faciliation of decreased lateral flexion of trunk with reaching. Continue PROM of RUE to decreased tightness.   Goals Short Term Goals Short Term Goal 1: Pt will be educated on HEP. Short Term Goal 1 Progress: Met Short Term Goal 2: Pt will be educated on use and wear of hand splint to decrease flexor tone. Short Term Goal 2 Progress: Met Short Term Goal 3: Pt will increase use of RUE to engage in AAROM in daily activities, 25% of the time. Short Term Goal 3 Progress: Met Short Term Goal 4: Pt will increase AROM in right shoulder to 25 degress of flexion to improve use of RUE in ADLs. Short Term Goal 4 Progress: Met Short Term Goal 5: Pt will don RLE AFO and shoe with modified independence Short Term Goal 5 Progress: Discontinued (comment) Short Term Goal 6: Pt will tolerate weightbearing on RUE for 1 minute to improve use of RUE in ADLs Short Term Goal 6 Progress: Met Short Term Goal 7: Pt will tolerate wear of hand splint for 6 hours per day, with no signs/symptoms of skin breakdown Short Term Goal 7 Progress: Met Long Term Goals Long Term Goal 1: Pt will increase use of RUE to engage in AROM in daily activities, 50% of the time. Long Term Goal 1 Progress: Progressing toward goal Long Term Goal 2: Pt will increase AROM in right shoulder to 45 degress of flexion to improve use of RUE in ADLs. Long Term Goal 2 Progress: Progressing toward goal Long Term Goal 3: Pt will tolerate weightbearing on RUE for 5 minutes to improve use of RUE in ADLs Long Term Goal 3 Progress: Met Long Term Goal 4: Pt will attain 3+/5 strength in  RUE to engage in daily acticities w RUE. Long Term Goal 4 Progress: Progressing toward goal Long Term Goal 5: Pt will don/doff hand splint w modified independence. Long Term Goal 5 Progress: Met  Problem List Patient Active Problem List   Diagnosis Date Noted  . Spastic hemiplegia affecting dominant side  05/23/2013  . Difficulty in walking(719.7) 03/08/2013  . Right leg weakness 03/08/2013  . Balance problems 03/08/2013  . Weakness of right upper extremity 03/08/2013  . Knee pain, right anterior 02/15/2013  . CVA (cerebral infarction) 02/01/2013  . Gout 08/05/2012  . HTN (hypertension) 08/05/2012    End of Session Activity Tolerance: Patient tolerated treatment well General Behavior During Therapy: [facility] for tasks assessed/performed  GO    Bea Graff, MS, OTR/L [phone]  05/25/2013, 4:16 PM

## 2013-05-25 NOTE — Progress Notes (Signed)
Physical Therapy Treatment Patient Details  Name: Bruce Guerrero MRN: [id number] Date of Birth: 05/28/1960  Today's Date: 05/25/2013 Time: 1520-1605 PT Time Calculation (min): 45 min Charge:  There ex 1520-1550; e-stim [id number] Visit#: 30 of 35  Re-eval: 06/08/13    Authorization: BCBS   Subjective: Symptoms/Limitations Symptoms: Pt has no complaint  Pain Assessment Currently in Pain?: Yes Pain Score: 3  Pain Location: Shoulder Pain Orientation: Right Pain Type: Chronic pain    Exercise/Treatments Functional Squat: 15 reps Functional Squat Limitations: deep with slow return  Seated Other Seated Knee Exercises: heel raise x 15 Supine Other Supine Knee Exercises: LE flex with manual resisted extension x 15 Sidelying Hip ABduction: Right;15 reps Other Sidelying Knee Exercises: manual resisted hip extension on powder board x 15. Other Sidelying Knee Exercises: foot eversion/inversion x 10 with manual resist Prone  Hamstring Curl: 10 reps;Limitations Hamstring Curl Limitations: 3# Hip Extension: 10 reps;Limitations Hip Extension Limitations: with knee bent therapist prevents raising of buttock   Modalities Modalities: Electrical Stimulation Manual Therapy Manual Therapy: Myofascial release Myofascial Release: MFR and manual massage to right upper arm and shoulder region to decrease fascial restrictions and pain Electrical Stimulation Electrical Stimulation Location: Anterior tib/gastrocnemius Electrical Stimulation Action: russian stim  Electrical Stimulation Parameters: reciprocal contraction; 50 BPS; 10/10   Physical Therapy Assessment and Plan PT Assessment and Plan Clinical Impression Statement: Concentrated on increasing mm strength pt needs therapist faciliation in all exercises to prevent substitutional patterns  PT Plan: change E-stim to gastroc only using 4 leads to attempt to get a better contraction.    Goals    Problem List Patient Active  Problem List   Diagnosis Date Noted  . Spastic hemiplegia affecting dominant side 05/23/2013  . Difficulty in walking(719.7) 03/08/2013  . Right leg weakness 03/08/2013  . Balance problems 03/08/2013  . Weakness of right upper extremity 03/08/2013  . Knee pain, right anterior 02/15/2013  . CVA (cerebral infarction) 02/01/2013  . Gout 08/05/2012  . HTN (hypertension) 08/05/2012    General Behavior [facility] Therapy: [facility] tasks assessed/performed  GP    Lequan Dobratz,CINDY 05/25/2013, 5:18 PM

## 2013-05-26 ENCOUNTER — Telehealth: Payer: Self-pay | Admitting: Physical Medicine & Rehabilitation

## 2013-05-26 NOTE — Telephone Encounter (Signed)
Pt states on 03.19.2015 at 832 am he does not want the botox injection at this time. He states he told Dr. Kirtland BouchardK at the last visit... Making note in the chart.. Pt's botox is approved by ins.Marland Kitchen..Marland Kitchen

## 2013-05-27 ENCOUNTER — Ambulatory Visit (HOSPITAL_COMMUNITY)
Admission: RE | Admit: 2013-05-27 | Discharge: 2013-05-27 | Disposition: A | Discharge status: Home / Self Care | Payer: BC Managed Care – PPO | Source: Ambulatory Visit | Attending: Family Medicine | Admitting: Family Medicine

## 2013-05-27 DIAGNOSIS — R262 Difficulty in walking, not elsewhere classified: Secondary | ICD-10-CM

## 2013-05-27 DIAGNOSIS — R2689 Other abnormalities of gait and mobility: Secondary | ICD-10-CM

## 2013-05-27 DIAGNOSIS — R29898 Other symptoms and signs involving the musculoskeletal system: Secondary | ICD-10-CM

## 2013-05-27 NOTE — Progress Notes (Signed)
Occupational Therapy Treatment Patient Details  Name: Bruce Guerrero MRN: [id number] Date of Birth: [birth year]  Today's Date: 05/27/2013 Time: 4401-0272 OT Time Calculation (min): 42 min Manual 1523-1540 (16') Neuro Re-Ed 1540-1605 (25')  Visit#: 30 of 36  Re-eval: 06/03/13    Authorization: BCBS  Authorization Time Period:    Authorization Visit#:   of    Subjective Symptoms/Limitations Symptoms: I've been trying to stretch iit and move it, going a little further everytime. Pain Assessment Currently in Pain?: Yes Pain Score: 1  Pain Location: Shoulder Pain Orientation: Right Pain Type: Chronic pain  Precautions/Restrictions     Exercise/Treatments Stretches External Rotation Stretch: 2 reps (12 second hold - stargazer stretch) Neurological Re-education Exercises Shoulder Flexion: PROM;5 reps Shoulder ABduction: PROM;5 reps Shoulder Protraction: PROM;5 reps Shoulder Horizontal ABduction: PROM;5 reps Shoulder External Rotation: PROM;5 reps Shoulder Internal Rotation: PROM;5 reps Elbow Flexion: PROM;10 reps;AROM Elbow Extension: PROM;10 reps;AROM Forearm Supination: PROM;15 reps;AROM;10 reps Forearm Pronation: PROM;10 reps;AROM Wrist Flexion: PROM;10 reps Wrist Extension: PROM;10 reps  Weight Bearing Exercises Standing with weight shifting on and off: Table push ups, approx 10 reps. bending from waist and plank-like position shifting weight onto toes.  Grasp and Release Thumb Opposition: Pt able to grasp 6 cards with thumb in lateral pinch.      Modalities Modalities: Electrical Stimulation Manual Therapy Manual Therapy: Myofascial release Myofascial Release: MFR and manual massage to right upper arm and shoulder region to decrease fascial restrictions and pain Electrical Stimulation Electrical Stimulation Location: gastrocnemius only 4 leads Electrical Stimulation Action: russian stim Electrical Stimulation Parameters: co-contraction ; 100 BPS; 10/10  x  15 minutes. Weight Bearing Technique Weight Bearing Technique: Yes RUE Weight Bearing Technique: Extended arm standing Response to Weight Bearing Technique: Input to RUE and functional use of RUE in transitioning from sidelying to sitting    Occupational Therapy Assessment and Plan OT Assessment and Plan Clinical Impression Statement: Pt reports increasing AAROM flexion stretch at home - reports decreased pain in shoulder as a result.  Pt has increased fatigue this session and decreased tolerance of grasp and release activities as a result.   OT Plan: P: Facilitate wall push ups. Attempt quadruped. Faciliation of decreased lateral flexion of trunk with reaching. Continue PROM of RUE to decreased tightness.   Goals Short Term Goals Short Term Goal 1: Pt will be educated on HEP. Short Term Goal 1 Progress: Met Short Term Goal 2: Pt will be educated on use and wear of hand splint to decrease flexor tone. Short Term Goal 2 Progress: Met Short Term Goal 3: Pt will increase use of RUE to engage in AAROM in daily activities, 25% of the time. Short Term Goal 3 Progress: Met Short Term Goal 4: Pt will increase AROM in right shoulder to 25 degress of flexion to improve use of RUE in ADLs. Short Term Goal 4 Progress: Met Short Term Goal 5: Pt will don RLE AFO and shoe with modified independence Short Term Goal 5 Progress: Discontinued (comment) Short Term Goal 6: Pt will tolerate weightbearing on RUE for 1 minute to improve use of RUE in ADLs Short Term Goal 6 Progress: Met Short Term Goal 7: Pt will tolerate wear of hand splint for 6 hours per day, with no signs/symptoms of skin breakdown Short Term Goal 7 Progress: Met Long Term Goals Long Term Goal 1: Pt will increase use of RUE to engage in AROM in daily activities, 50% of the time. Long Term Goal 1 Progress: Progressing toward goal Long  Term Goal 2: Pt will increase AROM in right shoulder to 45 degress of flexion to improve use of RUE in  ADLs. Long Term Goal 2 Progress: Progressing toward goal Long Term Goal 3: Pt will tolerate weightbearing on RUE for 5 minutes to improve use of RUE in ADLs Long Term Goal 3 Progress: Met Long Term Goal 4: Pt will attain 3+/5 strength in RUE to engage in daily acticities w RUE. Long Term Goal 4 Progress: Progressing toward goal Long Term Goal 5: Pt will don/doff hand splint w modified independence. Long Term Goal 5 Progress: Met  Problem List Patient Active Problem List   Diagnosis Date Noted  . Spastic hemiplegia affecting dominant side 05/23/2013  . Difficulty in walking(719.7) 03/08/2013  . Right leg weakness 03/08/2013  . Balance problems 03/08/2013  . Weakness of right upper extremity 03/08/2013  . Knee pain, right anterior 02/15/2013  . CVA (cerebral infarction) 02/01/2013  . Gout 08/05/2012  . HTN (hypertension) 08/05/2012    End of Session Activity Tolerance: Patient tolerated treatment well General Behavior During Therapy: [facility] for tasks assessed/performed OT Plan of Care OT Home Exercise Plan: Added stargazer stretch - atleast 12sec hold, 3 reps OT Patient Instructions: Pt demonstrated and verbalized understanding Consulted and Agree with Plan of Care: Patient  GO    Bea Graff, MS, OTR/L [phone]  05/27/2013, 4:16 PM

## 2013-05-27 NOTE — Progress Notes (Signed)
Physical Therapy Treatment Patient Details  Name: Bruce Guerrero MRN: [id number] Date of Birth: May 31, 1960  Today's Date: 05/27/2013 Time: 1430-1520 PT Time Calculation (min): 50 min Charge:  There ex 1430-1500; Esitm 1502-1520 Visit#: 31 of 35  Re-eval: 06/08/13    Authorization: BCBS   Subjective: Symptoms/Limitations Symptoms: Pt has no complaint    Exercise/Treatments   Standing Heel Raises: 10 reps;Limitations Heel Raises Limitations: make sure = weight Forward Lunges: Right;10 reps Side Lunges: Right;10 reps Functional Squat: 10 reps Functional Squat Limitations: stop at 1/4 /1/2 and 3/4 down  SLS:  (x5 11 seconds max) Seated Other Seated Knee Exercises: heel raise x 15   Sidelying Hip ABduction: Strengthening;Right;Limitations Hip ABduction Limitations:  4# x 5 ; 0# x 10 Other Sidelying Knee Exercises: hip extension x 15 Prone  Hamstring Curl: 10 reps Hamstring Curl Limitations: 4#   Modalities Modalities: Archivistlectrical Stimulation Electrical Stimulation Electrical Stimulation Location: gastrocnemius only 4 leads Electrical Stimulation Action: russian stim Electrical Stimulation Parameters: co-contraction ; 100 BPS; 10/10  x 15 minutes.  Physical Therapy Assessment and Plan PT Assessment and Plan Clinical Impression Statement: Pt showing slow gains in gastroc strength.  Pt needed therapist faciliation for proper positioning and weight bearing throughout standing exercises. Rehab Potential: Good PT Frequency: Min 3X/week PT Duration: 4 weeks    Goals  progressing  Problem List Patient Active Problem List   Diagnosis Date Noted  . Spastic hemiplegia affecting dominant side 05/23/2013  . Difficulty in walking(719.7) 03/08/2013  . Right leg weakness 03/08/2013  . Balance problems 03/08/2013  . Weakness of right upper extremity 03/08/2013  . Knee pain, right anterior 02/15/2013  . CVA (cerebral infarction) 02/01/2013  . Gout 08/05/2012  . HTN  (hypertension) 08/05/2012    PT - End of Session Equipment Utilized During Treatment: Gait belt Activity Tolerance: Patient tolerated treatment well General Behavior During Therapy: [facility] assessed/performed  GP    Catelynn Sparger,CINDY 05/27/2013, 3:17 PM

## 2013-05-30 ENCOUNTER — Ambulatory Visit (HOSPITAL_COMMUNITY)
Admission: RE | Admit: 2013-05-30 | Discharge: 2013-05-30 | Disposition: A | Discharge status: Home / Self Care | Payer: BC Managed Care – PPO | Source: Ambulatory Visit | Attending: Family Medicine | Admitting: Family Medicine

## 2013-05-30 DIAGNOSIS — R29898 Other symptoms and signs involving the musculoskeletal system: Secondary | ICD-10-CM

## 2013-05-30 NOTE — Progress Notes (Signed)
Occupational Therapy Treatment Patient Details  Name: Bruce Guerrero MRN: [id number] Date of Birth: Aug 02, 1960  Today's Date: 05/30/2013 Time: 4098-11911518-1602 OT Time Calculation (min): 44 min Manual therapy [id number] 14' Neuro reeducation 1532- 1602 30' Visit#: 31 of 36  Re-eval: 06/03/13    Authorization: BCBS  A Subjective S:  I have been really trying to use my right arm when I get up from laying down.  Pain Assessment Currently in Pain?: No/denies  Precautions/Restrictions   progress as tolerated  Exercise/Treatments Neurological Re-education Exercises Shoulder Flexion: AAROM;10 reps;Seated Shoulder ABduction: AROM;10 reps;Seated Shoulder External Rotation: AAROM;10 reps;Seated Shoulder Internal Rotation: AAROM;10 reps;Seated Elbow Flexion: AAROM;10 reps;Seated Elbow Extension: AROM;10 reps;Seated Forearm Supination: AROM;10 reps;Seated Forearm Pronation: AROM;10 reps;Seated Wrist Flexion: AROM;10 reps Wrist Extension: AROM;10 reps;Seated   Development of Reach  Development of Reach: Hand over hand assistance Hand over Hand Assistance while Reaching: patient seated edge of mat with 5 fist size items to work on grasping item, flexing shoulder and extending elbow to drop item on target on floor.  Required intermitten mod pa to release grasp on items, and min pa to reach forward      Manual Therapy Manual Therapy: Myofascial release Myofascial Release: MFR and manual massage to right upper arm and shoulder region to decrease fascial restrictions and pain  Occupational Therapy Assessment and Plan OT Assessment and Plan Clinical Impression Statement: A:  good release on fist size items 50% attempts. OT Plan: P:  work on weightbearing while completing functional activity.     Goals Short Term Goals Short Term Goal 1: Pt will be educated on HEP. Short Term Goal 2: Pt will be educated on use and wear of hand splint to decrease flexor tone. Short Term Goal 3: Pt will  increase use of RUE to engage in AAROM in daily activities, 25% of the time. Short Term Goal 4: Pt will increase AROM in right shoulder to 25 degress of flexion to improve use of RUE in ADLs. Short Term Goal 5: Pt will don RLE AFO and shoe with modified independence Short Term Goal 6: Pt will tolerate weightbearing on RUE for 1 minute to improve use of RUE in ADLs Short Term Goal 7: Pt will tolerate wear of hand splint for 6 hours per day, with no signs/symptoms of skin breakdown Long Term Goals Long Term Goal 1: Pt will increase use of RUE to engage in AROM in daily activities, 50% of the time. Long Term Goal 2: Pt will increase AROM in right shoulder to 45 degress of flexion to improve use of RUE in ADLs. Long Term Goal 3: Pt will tolerate weightbearing on RUE for 5 minutes to improve use of RUE in ADLs Long Term Goal 4: Pt will attain 3+/5 strength in RUE to engage in daily acticities w RUE. Long Term Goal 5: Pt will don/doff hand splint w modified independence.  Problem List Patient Active Problem List   Diagnosis Date Noted  . Spastic hemiplegia affecting dominant side 05/23/2013  . Difficulty in walking(719.7) 03/08/2013  . Right leg weakness 03/08/2013  . Balance problems 03/08/2013  . Weakness of right upper extremity 03/08/2013  . Knee pain, right anterior 02/15/2013  . CVA (cerebral infarction) 02/01/2013  . Gout 08/05/2012  . HTN (hypertension) 08/05/2012    End of Session Activity Tolerance: Patient tolerated treatment well General Behavior During Therapy: [facility] assessed/performed  GO    Shirlean MylarBethany H. Murray, OTR/L  05/30/2013, 4:35 PM

## 2013-05-30 NOTE — Progress Notes (Signed)
Physical Therapy Treatment Patient Details  Name: Bruce Guerrero MRN: [id number] Date of Birth: May 05, 1960  Today's Date: 05/30/2013 Time: 4098-11911425-1515 PT Time Calculation (min): 50 min Charges: Therex x 32'(1428-1500) Estim x 10'(1505-1515)  Visit#: 32 of 35  Re-eval: 06/08/13  Authorization: BCBS    Subjective: Symptoms/Limitations Symptoms: Pt states that he wore regular tennis shoes over the weekend and he was able to walk "okay".  Pain Assessment Currently in Pain?: No/denies   Exercise/Treatments  Standing Heel Raises: 10 reps Heel Raises Limitations: Varied stances Forward Lunges: 10 reps;Both;Limitations Seated Other Seated Knee Exercises: Sit to stand x 5 with RLE back Other Seated Knee Exercises: Tall kneeling on mat table x 1' Supine Other Supine Knee Exercises: Plantarflexion/dorsiflexion with manual resistance x 10 each Other Supine Knee Exercises: Manual stretching for plantarflexion and dorsiflexion   Modalities Modalities: Electrical Stimulation Manual Therapy Manual Therapy: Myofascial release Myofascial Release: MFR and manual massage to right upper arm and shoulder region to decrease fascial restrictions and pain Electrical Stimulation Electrical Stimulation Location: gastrocnemius 4 leads Electrical Stimulation Action: Doctor, hospital[facility] Electrical Stimulation Parameters: co-contraction ; 100 BPS; 10/10 x 10 minutes  Physical Therapy Assessment and Plan PT Assessment and Plan Clinical Impression Statement: Pt continues to make gradual gains with ankle strength. Improved muscle contraction noted with manual resistance with dorsiflexion and plantarflexion. Began tall kneeling at mat with moderate difficulty due to right hip weakness.  Pt requires multimodal cueing with squats and lunges to equalize weight distribution.  Rehab Potential: Good PT Frequency: Min 3X/week PT Duration: 4 weeks PT Plan: Continue to progress functional strength, stability and gait  mechanics per PT POC.     Problem List Patient Active Problem List   Diagnosis Date Noted  . Spastic hemiplegia affecting dominant side 05/23/2013  . Difficulty in walking(719.7) 03/08/2013  . Right leg weakness 03/08/2013  . Balance problems 03/08/2013  . Weakness of right upper extremity 03/08/2013  . Knee pain, right anterior 02/15/2013  . CVA (cerebral infarction) 02/01/2013  . Gout 08/05/2012  . HTN (hypertension) 08/05/2012    PT - End of Session Equipment Utilized During Treatment: Gait belt Activity Tolerance: Patient tolerated treatment well General Behavior During Therapy: [facility] assessed/performed  Seth Bakeebekah Hervey Wedig, PTA 05/30/2013, 4:37 PM

## 2013-06-01 ENCOUNTER — Ambulatory Visit (HOSPITAL_COMMUNITY)
Admission: RE | Admit: 2013-06-01 | Discharge: 2013-06-01 | Disposition: A | Discharge status: Home / Self Care | Payer: BC Managed Care – PPO | Source: Ambulatory Visit | Attending: Family Medicine | Admitting: Family Medicine

## 2013-06-01 DIAGNOSIS — R2689 Other abnormalities of gait and mobility: Secondary | ICD-10-CM

## 2013-06-01 DIAGNOSIS — R262 Difficulty in walking, not elsewhere classified: Secondary | ICD-10-CM

## 2013-06-01 DIAGNOSIS — R29898 Other symptoms and signs involving the musculoskeletal system: Secondary | ICD-10-CM

## 2013-06-01 NOTE — Progress Notes (Signed)
Physical Therapy Treatment Patient Details  Name: Bruce Guerrero MRN: [id number] Date of Birth: 1961-03-05  Today's Date: 06/01/2013 Time: 1522-1600 PT Time Calculation (min): 38 min Charge:TE 1522-1548, Estim [id number]  Visit#: 33 of 35  Re-eval: 06/08/13 Assessment Diagnosis: CVA  Next MD Visit: Doroteo BradfordKirstein 07/05/2013  Prior Therapy: [facility]pt rehab.  Authorization: BCBS   Authorization Time Period:    Authorization Visit#:   of     Subjective: Symptoms/Limitations Symptoms: Pt stated he is currently pain free, reports increased ease getting onto knees, able to ambulate without AD without back pain now. Pain Assessment Currently in Pain?: No/denies Pain Score: 2  Pain Location: Shoulder Pain Orientation: Right Pain Type: Chronic pain  Precautions/Restrictions  Precautions Precautions: Fall Restrictions Weight Bearing Restrictions: No  Exercise/Treatments Stretches Gastroc Stretch: 3 reps;30 seconds;Limitations Gastroc Stretch Limitations: supine with sheet  Seated Other Seated Knee Exercises: Tall kneeling on mat table, weight shifting R/L and hip mobilty A/P; quadruped wtih Lt UE up to tall kneeling 5x; short to tall kneeling 5x Supine Other Supine Knee Exercises: Plantarflexion/dorsiflexion with manual resistance c 10 each Other Supine Knee Exercises: Manual stretching for plantarflexion and dorsiflexion  Modalities Modalities: Archivistlectrical Stimulation Electrical Stimulation Electrical Stimulation Location: gastrocnemius 4 leads Electrical Stimulation Action: Doctor, hospital[facility] Electrical Stimulation Parameters: co-contraction ; 100 BPS; 10/10 x 10 minutes Electrical Stimulation Goals: Neuromuscular facilitation  Physical Therapy Assessment and Plan PT Assessment and Plan Clinical Impression Statement: Continued tall kneeling activities to mprove weight distribution and balance, therapist facilitation for proper mm activation and increase distrubtion to Rt side.  Noted good  contraction with russian estim for plantar flexion, pt able to complete resisted exercises with decreased compensation.  PT Plan: Continue to progress functional strength, stability and gait mechanics per PT POC.    Goals PT Short Term Goals PT Short Term Goal 1: Pt to be able to stand for 20 minutes to socialize PT Short Term Goal 1 - Progress: Progressing toward goal PT Short Term Goal 2: Pt to be able to walk with QC inside x 15 minutes. PT Short Term Goal 3: Pt strength to be increased 1/2 grade to allow the above to occur. PT Short Term Goal 3 - Progress: Progressing toward goal PT Short Term Goal 4: Pt Berg to be increased 7 pts to improve safety of ambulation. PT Long Term Goals PT Long Term Goal 1: Pt to be I in advance HEP PT Long Term Goal 2: PT to be able to walk outside with cane x 30 mintues Long Term Goal 3: Pt to be able to walk inside without an assistive device Long Term Goal 3 Progress: Progressing toward goal Long Term Goal 4: Pt to berg score to increase by 14 points to improve safety of ambulation  Problem List Patient Active Problem List   Diagnosis Date Noted  . Spastic hemiplegia affecting dominant side 05/23/2013  . Difficulty in walking(719.7) 03/08/2013  . Right leg weakness 03/08/2013  . Balance problems 03/08/2013  . Weakness of right upper extremity 03/08/2013  . Knee pain, right anterior 02/15/2013  . CVA (cerebral infarction) 02/01/2013  . Gout 08/05/2012  . HTN (hypertension) 08/05/2012    PT - End of Session Equipment Utilized During Treatment: Gait belt Activity Tolerance: Patient tolerated treatment well General Behavior During Therapy: [facility] for [facility] assessed/performed  GP    Juel BurrowCockerham, Bruce Guerrero 06/01/2013, 4:12 PM

## 2013-06-01 NOTE — Progress Notes (Signed)
Occupational Therapy Treatment Patient Details  Name: Bruce Guerrero MRN: [id number] Date of Birth: 1960/05/28  Today's Date: 06/01/2013 Time: 8299-3716 OT Time Calculation (min): 41 min Manual 1440-1555 (15') Neuro Re-ed 1555-1521 (26')  Visit#: 32 of 36  Re-eval: 06/03/13    Authorization: [employer]  Authorization Time Period:    Authorization Visit#:   of    Subjective Symptoms/Limitations Symptoms: "The most I can do in the kitchen is wash dishes - if i have to open something, forget it." Pain Assessment Currently in Pain?: No/denies Pain Score: 2  Pain Location: Shoulder Pain Orientation: Right Pain Type: Chronic pain  Precautions/Restrictions  Precautions Precautions: Fall Restrictions Weight Bearing Restrictions: No  Exercise/Treatments   Neurological Re-education Exercises Shoulder Flexion: PROM;10 reps;AAROM Shoulder ABduction: PROM;10 reps;AAROM Elbow Flexion: AROM;10 reps Elbow Extension: AAROM;10 reps Forearm Supination: PROM;15 reps;AAROM;10 reps Forearm Pronation: PROM;15 reps;AAROM;10 reps  Weight Bearing Exercises Quadraped with weight on forearm: Pt rolled to left to achieve quad position on mat with weight on forearms. pt did not feel comfortable transitinging to weigthon arms extended. Able to maintain position for approximatley 1 minute (at second attempt), while rocking anterior/posterior to shift weight from knees to forearms. Pt Indicated shoulder fatigue at end of session. Standing with weight shifting on and off: Standing at kitchen counter during kitchen prep task, pt bearing weight through RUE while reaching laterally to place/move cupcakes.      Manual Therapy Manual Therapy: Myofascial release Myofascial Release: MFR and manual massage to right upper arm and shoulder region to decrease fascial restrictions and pain    Occupational Therapy Assessment and Plan OT Assessment and Plan Clinical Impression Statement: Pt had good response to  weightbearing activity in kitchen - encouraged pt to weightbear as often as possible. Attempted quadrapued weightbearing this session - pt became very fatigued and had to be reminded to breathe consistently. OT Plan: P:  Re-Assessment. follow up on stargazer stretch.   Goals Short Term Goals Short Term Goal 1: Pt will be educated on HEP. Short Term Goal 1 Progress: Met Short Term Goal 2: Pt will be educated on use and wear of hand splint to decrease flexor tone. Short Term Goal 2 Progress: Met Short Term Goal 3: Pt will increase use of RUE to engage in AAROM in daily activities, 25% of the time. Short Term Goal 3 Progress: Met Short Term Goal 4: Pt will increase AROM in right shoulder to 25 degress of flexion to improve use of RUE in ADLs. Short Term Goal 4 Progress: Met Short Term Goal 5: Pt will don RLE AFO and shoe with modified independence Short Term Goal 5 Progress: Discontinued (comment) Short Term Goal 6: Pt will tolerate weightbearing on RUE for 1 minute to improve use of RUE in ADLs Short Term Goal 6 Progress: Met Short Term Goal 7: Pt will tolerate wear of hand splint for 6 hours per day, with no signs/symptoms of skin breakdown Short Term Goal 7 Progress: Met Long Term Goals Long Term Goal 1: Pt will increase use of RUE to engage in AROM in daily activities, 50% of the time. Long Term Goal 1 Progress: Progressing toward goal Long Term Goal 2: Pt will increase AROM in right shoulder to 45 degress of flexion to improve use of RUE in ADLs. Long Term Goal 2 Progress: Progressing toward goal Long Term Goal 3: Pt will tolerate weightbearing on RUE for 5 minutes to improve use of RUE in ADLs Long Term Goal 3 Progress: Met Long Term Goal  4: Pt will attain 3+/5 strength in RUE to engage in daily acticities w RUE. Long Term Goal 4 Progress: Progressing toward goal Long Term Goal 5: Pt will don/doff hand splint w modified independence. Long Term Goal 5 Progress: Met  Problem  List Patient Active Problem List   Diagnosis Date Noted  . Spastic hemiplegia affecting dominant side 05/23/2013  . Difficulty in walking(719.7) 03/08/2013  . Right leg weakness 03/08/2013  . Balance problems 03/08/2013  . Weakness of right upper extremity 03/08/2013  . Knee pain, right anterior 02/15/2013  . CVA (cerebral infarction) 02/01/2013  . Gout 08/05/2012  . HTN (hypertension) 08/05/2012    End of Session Activity Tolerance: Patient tolerated treatment well General Behavior During Therapy: [facility] for tasks assessed/performed  GO    Bea Graff, MS, OTR/L [phone]  06/01/2013, 4:39 PM

## 2013-06-03 ENCOUNTER — Ambulatory Visit (HOSPITAL_COMMUNITY)
Admission: RE | Admit: 2013-06-03 | Discharge: 2013-06-03 | Disposition: A | Discharge status: Home / Self Care | Payer: BC Managed Care – PPO | Source: Ambulatory Visit | Attending: Family Medicine | Admitting: Family Medicine

## 2013-06-03 NOTE — Progress Notes (Signed)
Physical Therapy Treatment Patient Details  Name: Bruce Guerrero MRN: [id number] Date of Birth: 04-27-1960  Today's Date: 06/03/2013 Time: 1430-1510 PT Time Calculation (min): 40 min  Visit#: 34 of 35  Re-eval: 06/08/13    Authorization: [employer]   Authorization Time Period:    Authorization Visit#:   of     Subjective: Symptoms/Limitations Symptoms: Pt states he has been trying to concentrate on bending his knee when he walks Pain Assessment Currently in Pain?: Yes Pain Score: 1  Pain Location: Shoulder Pain Orientation: Right Pain Type: Chronic pain Pain Relieving Factors: stretching  Precautions/Restrictions  Precautions Precautions: Fall Restrictions Weight Bearing Restrictions: No  Exercise/Treatments   Standing Gait Training: concentrating on taking heel off prior to advancing foot; advance straight no circumduction.  Other Standing Knee Exercises: Lt foot on ground Rt ball on foam pt raises heel off floor x 10 Seated Other Seated Knee Exercises: sit to stand Lt leg in air on high low table x 10 Other Seated Knee Exercises: Baps L1; L2 x 10 each direction Supine Other Supine Knee Exercises: PROM for DF  Sidelying Hip ABduction: Strengthening;10 reps Hip ABduction Limitations: 4# Other Sidelying Knee Exercises: Hip ext; knee flex 4# x 10 reps each     Physical Therapy Assessment and Plan PT Assessment and Plan Clinical Impression Statement: Pt needed maximum facilitation with Baps to prevent hip mm carryover.  Pt and therapist went over pt gait deviations and what he needs to accomplish to achieve a normalized gt.  Pt continues to show improved strength. Pt will benefit from skilled therapeutic intervention in order to improve on the following deficits: Abnormal gait;Decreased activity tolerance;Decreased balance;Decreased mobility;Decreased strength;Difficulty walking;Impaired tone;Decreased coordination;Increased fascial restricitons;Impaired UE functional  use PT Plan: Reassess next visit    Goals  progressing  Problem List Patient Active Problem List   Diagnosis Date Noted  . Spastic hemiplegia affecting dominant side 05/23/2013  . Difficulty in walking(719.7) 03/08/2013  . Right leg weakness 03/08/2013  . Balance problems 03/08/2013  . Weakness of right upper extremity 03/08/2013  . Knee pain, right anterior 02/15/2013  . CVA (cerebral infarction) 02/01/2013  . Gout 08/05/2012  . HTN (hypertension) 08/05/2012    PT - End of Session Equipment Utilized During Treatment: Gait belt General Behavior During Therapy: [facility] for tasks assessed/performed PT Plan of Care PT Home Exercise Plan: Pt to work on gt for five minutes three times a day concentrating on heel lift prior to advancement and advance leg without leg circumduction.   GP    RUSSELL,CINDY 06/03/2013, 4:34 PM

## 2013-06-03 NOTE — Evaluation (Signed)
Occupational Therapy Re-Evaluation  Patient Details  Name: Bruce Guerrero MRN: [id number] Date of Birth: 11-05-1960  Today's Date: 06/03/2013 Time: 1091-2679 OT Time Calculation (min): 50 min Manual 1515-1525 (10') Neuro Re-Ed 1525-1550 (25') ROM Measurement 1550-1605 (15')  Visit#: 33 of 44  Re-eval: 07/01/13  Assessment Diagnosis: CVA - dominant side Next MD Visit: April 28th  Authorization: [employer]  Authorization Time Period:    Authorization Visit#:   of     Past Medical History:  Past Medical History  Diagnosis Date  . Gout   . Hypertension   . Stroke    Past Surgical History: No past surgical history on file.  Subjective Symptoms/Limitations Symptoms: There are times tath i am able to stiffen these fingers - not all the time, but I can get them there, and if i get them there i can hold them for a few seconds." Pain Assessment Currently in Pain?: Yes Pain Score: 1  Pain Location: Shoulder Pain Orientation: Right Pain Type: Chronic pain Pain Relieving Factors: stretching  Precautions/Restrictions  Precautions Precautions: Fall Restrictions Weight Bearing Restrictions: No  Assessment ADL/Vision/Perception ADL ADL Comments: Still can't cut food, use right hand to put car in gear (not driving currenlty). Pt used L for eating and grooming, dresses with independence. Pt uses shoe buttons. Independent with toileting, and bathing (uses shower chair, but also stands during shower).  Pt indicates that it is easer to use his RUE in functional tasks.(i.e. holding cell phone with right while inserting charging plug with left, using RUe to open car door.)  Additional Assessments RUE Assessment RUE Assessment: Exceptions to [facility] RUE Strength RUE Overall Strength Comments: Pt has elevation (three-quarters range), elbow flexion (three quarters range), pronation, supination (to neutral), digit extension (quarter range 1st, trace in 2nd, 3rd, 4th), digit flexion (full),  wrist extention (to neutral)), wrist flexion (half range). LUE Assessment LUE Assessment: Within Functional Limits    Exercise/Treatments Neurological Re-education Exercises Shoulder Flexion: PROM;10 reps Shoulder ABduction: PROM;10 reps Elbow Flexion: PROM;10 reps;AROM Elbow Extension: PROM;10 reps;AROM Forearm Supination: PROM;10 reps;AAROM Forearm Pronation: PROM;10 reps;AROM  Weight Bearing Exercises Seated with weight on hand: Seated EOM, Pt bearing weight through RUE while discussiong goals and directions for therapy.     Manual Therapy Manual Therapy: Myofascial release Myofascial Release: MFR and manual massage to right upper arm and shoulder region to decrease fascial restrictions and pain  Weight Bearing Technique Weight Bearing Technique: Yes RUE Weight Bearing Technique: Extended arm seated  Occupational Therapy Assessment and Plan OT Assessment and Plan Clinical Impression Statement: Re-Evaluation completed this date. Pt has met 6/7 STG (with one discontinued) and 3/5 LTG (with one discontinued).  Collaborated to add 3 goals this session to work towards in the next month. Pt vebalizes taht he is using his RUE more in daily, functional tasks (such as to hold objects while maniuulating with LUE). Pt is determined to continue making progress, but increases in functional movement have slowed overall. pt has increased shouldr elevation and flexion and thumb flexion/extension from previous eval.  Pt will benefit from skilled therapeutic intervention in order to improve on the following deficits: Abnormal gait;Decreased activity tolerance;Decreased balance;Decreased mobility;Decreased strength;Difficulty walking;Impaired tone;Decreased coordination;Increased fascial restricitons;Impaired UE functional use Rehab Potential: Good OT Frequency: Min 3X/week OT Duration: 4 weeks OT Treatment/Interventions: Self-care/ADL training;Therapeutic activities;Therapeutic exercise;Neuromuscular  education;Patient/family education;Manual therapy;Modalities;Splinting OT Plan: P: Continue OT for another 4 weeks and reassess for continued progress. Discuss pt's current insurance - how many therapy visits allotted per year. Session to focus  on grasp and release while reaching (e-stim for digit extension PRN).   Goals Short Term Goals Short Term Goal 1: Pt will be educated on HEP. Short Term Goal 1 Progress: Met Short Term Goal 2: Pt will be educated on use and wear of hand splint to decrease flexor tone. Short Term Goal 2 Progress: Met Short Term Goal 3: Pt will increase use of RUE to engage in AAROM in daily activities, 25% of the time. Short Term Goal 3 Progress: Met Short Term Goal 4: Pt will increase AROM in right shoulder to 25 degress of flexion to improve use of RUE in ADLs. Short Term Goal 4 Progress: Met Short Term Goal 5: Pt will don RLE AFO and shoe with modified independence Short Term Goal 5 Progress: Discontinued (comment) Short Term Goal 6: Pt will tolerate weightbearing on RUE for 1 minute to improve use of RUE in ADLs Short Term Goal 6 Progress: Met Short Term Goal 7: Pt will tolerate wear of hand splint for 6 hours per day, with no signs/symptoms of skin breakdown Short Term Goal 7 Progress: Met Long Term Goals Long Term Goal 1: Pt will increase use of RUE to engage in AROM in daily activities, 50% of the time. Long Term Goal 1 Progress: Progressing toward goal Long Term Goal 2: Pt will increase AROM in right shoulder to 45 degress of flexion to improve use of RUE in ADLs. Long Term Goal 2 Progress: Met Long Term Goal 3: Pt will tolerate weightbearing on RUE for 5 minutes to improve use of RUE in ADLs Long Term Goal 3 Progress: Met Long Term Goal 4: Pt will attain 3+/5 strength in RUE to engage in daily acticities w RUE. Long Term Goal 4 Progress: Progressing toward goal Long Term Goal 5: Pt will don/doff hand splint w modified independence. (Pt no longer needs  splint at night due to hand no longer tightening during sleep) Long Term Goal 5 Progress: Discontinued (comment) Additional Long Term Goals?: Yes Long Term Goal 6: Pt will increase AROM in right shoulder to 90 degress of flexion to improve use of RUE in ADLs. Long Term Goal 6 Progress: Progressing toward goal Long Term Goal 7: Pt will improve right elbow extension to 75% range to imrpove ability to puch up from surfaces. Long Term Goal 7 Progress: Progressing toward goal Long Term Goal 8: Pt will improve digit extension to minimal in order to faciliate relase of objects. Long Term Goal 8 Progress: Progressing toward goal  Problem List Patient Active Problem List   Diagnosis Date Noted  . Spastic hemiplegia affecting dominant side 05/23/2013  . Difficulty in walking(719.7) 03/08/2013  . Right leg weakness 03/08/2013  . Balance problems 03/08/2013  . Weakness of right upper extremity 03/08/2013  . Knee pain, right anterior 02/15/2013  . CVA (cerebral infarction) 02/01/2013  . Gout 08/05/2012  . HTN (hypertension) 08/05/2012    End of Session Activity Tolerance: Patient tolerated treatment well General Behavior During Therapy: [facility] for tasks assessed/performed  GO    Bea Graff, [place], OTR/L [phone]  06/03/2013, 4:27 PM   Physician Documentation Your signature is required to indicate approval of the treatment plan as stated above.  Please sign and either send electronically or make a copy of this report for your files and return this physician signed original.  Please mark one 1.__approve of plan  2. ___approve of plan with the following conditions.   ______________________________  _____________________ Physician Signature                                                                                                             Date

## 2013-06-06 ENCOUNTER — Ambulatory Visit (HOSPITAL_COMMUNITY)
Admission: RE | Admit: 2013-06-06 | Discharge: 2013-06-06 | Disposition: A | Discharge status: Home / Self Care | Payer: BC Managed Care – PPO | Source: Ambulatory Visit | Attending: Family Medicine | Admitting: Family Medicine

## 2013-06-06 DIAGNOSIS — R29898 Other symptoms and signs involving the musculoskeletal system: Secondary | ICD-10-CM

## 2013-06-06 DIAGNOSIS — R2689 Other abnormalities of gait and mobility: Secondary | ICD-10-CM

## 2013-06-06 DIAGNOSIS — R262 Difficulty in walking, not elsewhere classified: Secondary | ICD-10-CM

## 2013-06-06 NOTE — Evaluation (Signed)
Physical Therapy Evaluation  Patient Details  Name: Bruce Guerrero MRN: [id number] Date of Birth: [birth year]  Today's Date: 06/06/2013 Time:1515  - 1608  charge:  MM test 1515-1530; there ex 1530-1550; berg [phone]; foto [phone]              Visit#: 19 of 57  Re-eval: 07/06/13 Assessment Diagnosis: CVA   Authorization: Lorella Nimrod       Past Medical History:  Past Medical History  Diagnosis Date  . Gout   . Hypertension   . Stroke    Past Surgical History: No past surgical history on file.  Subjective Symptoms/Limitations Symptoms: I have been working at home.  Pt states he does not use his cane all the time only when he goes out of the house or late at night if he is tired.   How long can you sit comfortably?: no problem How long can you stand comfortably?: Able to stand for 20 minutes was ten minutes  How long can you walk comfortably?: Walking with his cane he is able to walk for 20 minutes was 10 minutes.  Pain Assessment Currently in Pain?: No/denies  Precautions/Restrictions  Precautions Precautions: Fall Restrictions Weight Bearing Restrictions: No  Sensation/Coordination/Flexibility/Functional Tests Functional Tests Functional Tests: foto 64 was 52  Assessment RUE Assessment RUE Assessment: Exceptions to [facility] RUE Strength RUE Overall Strength Comments: Pt has elevation (three-quarters range), elbow flexion (three quarters range), pronation, supination (to neutral), digit extension (quarter range 1st, trace in 2nd, 3rd, 4th), digit flexion (full), wrist extention (to neutral)), wrist flexion (half range). LUE Assessment LUE Assessment: Within Functional Limits RLE Strength Right Hip Flexion:  (4-/5 was 3+/5) Right Hip Extension: 2/5 (was 2-/5) Right Hip ABduction: 3+/5 (was 3/5 ) Right Hip ADduction: 5/5 (no change) Right Knee Flexion: 3/5 (was a 3-5) Right Knee Extension:  (4-/5 was 3+/5 ) Right Ankle Dorsiflexion: 3+/5 (was 3-/5) Right Ankle Plantar  Flexion: 2-/5 (was 1/5 ) Right Ankle Inversion: 3/5 (was 2/5) Right Ankle Eversion: 2-/5  Exercise/Treatments Mobility/Balance  Berg Balance Test Sit to Stand: Able to stand without using hands and stabilize independently Standing Unsupported: Able to stand safely 2 minutes Sitting with Back Unsupported but Feet Supported on Floor or Stool: Able to sit safely and securely 2 minutes Stand to Sit: Sits safely with minimal use of hands Transfers: Able to transfer safely, minor use of hands Standing Unsupported with Eyes Closed: Able to stand 10 seconds safely Standing Ubsupported with Feet Together: Able to place feet together independently and stand 1 minute safely From Standing, Reach Forward with Outstretched Arm: Can reach confidently >25 cm (10") From Standing Position, Pick up Object from Floor: Able to pick up shoe safely and easily From Standing Position, Turn to Look Behind Over each Shoulder: Looks behind from both sides and weight shifts well Turn 360 Degrees: Able to turn 360 degrees safely but slowly Standing Unsupported, Alternately Place Feet on Step/Stool: Able to stand independently and complete 8 steps >20 seconds Standing Unsupported, One Foot in Front: Able to place foot tandem independently and hold 30 seconds Standing on One Leg: Tries to lift leg/unable to hold 3 seconds but remains standing independently Total Score: 50      Seated Other Seated Knee Exercises: Baps ; L2 x 10 each direction    Physical Therapy Assessment and Plan PT Assessment and Plan Clinical Impression Statement: PT able to complete Baps with greater ease today.  Reassessment shows improvement in all mm except knee flextion as well as  improvement in balance.  Recommend to continue therapy as pt continues to make functional gains.  PT Frequency: Min 3X/week PT Duration: 4 weeks    Goals Home Exercise Program Pt/caregiver will Perform Home Exercise Program: For increased strengthening PT  Goal: Perform Home Exercise Program - Progress: Met PT Short Term Goals Time to Complete Short Term Goals: 3 weeks PT Short Term Goal 1: Pt to be able to stand for 20 minutes to socialize PT Short Term Goal 1 - Progress: Met PT Short Term Goal 2: Pt to be able to walk with QC inside x 15 minutes. PT Short Term Goal 2 - Progress: Met PT Short Term Goal 3: Pt strength to be increased 1/2 grade to allow the above to occur. PT Short Term Goal 3 - Progress: Met PT Short Term Goal 4: Pt Berg to be increased 7 pts to improve safety of ambulation. PT Short Term Goal 4 - Progress: Met PT Long Term Goals PT Long Term Goal 1: Pt to be I in advance HEP PT Long Term Goal 1 - Progress: Met PT Long Term Goal 2: PT to be able to walk outside with cane x 30 mintues PT Long Term Goal 2 - Progress: Progressing toward goal Long Term Goal 3: Pt to be able to walk inside without an assistive device Long Term Goal 3 Progress: Met Long Term Goal 4: Pt to berg score to increase by 14 points to improve safety of ambulation Long Term Goal 4 Progress: Progressing toward goal  Problem List Patient Active Problem List   Diagnosis Date Noted  . Spastic hemiplegia affecting dominant side 05/23/2013  . Difficulty in walking(719.7) 03/08/2013  . Right leg weakness 03/08/2013  . Balance problems 03/08/2013  . Weakness of right upper extremity 03/08/2013  . Knee pain, right anterior 02/15/2013  . CVA (cerebral infarction) 02/01/2013  . Gout 08/05/2012  . HTN (hypertension) 08/05/2012    General Behavior During Therapy: [facility] for tasks assessed/performed PT Plan of Care PT Home Exercise Plan: Continue to emphasis strengthening hip extensor, abductor and ankle mm.  GP    Victorya Hillman,CINDY 06/06/2013, 4:54 PM  Physician Documentation Your signature is required to indicate approval of the treatment plan as stated above.  Please sign and either send electronically or make a copy of this report for your files and  return this physician signed original.   Please mark one 1.__approve of plan  2. ___approve of plan with the following conditions.   ______________________________                                                          _____________________ Physician Signature                                                                                                             Date

## 2013-06-06 NOTE — Progress Notes (Signed)
Occupational Therapy Treatment Patient Details  Name: Bruce Guerrero MRN: [id number] Date of Birth: July 04, 1960  Today's Date: 06/06/2013 Time: 4496-7591 OT Time Calculation (min): 42 min NM re-ed [phone] 42'  Visit#: 24 of 57  Re-eval: 07/01/13    Authorization: BCBS  Authorization Time Period:    Authorization Visit#:   of    Subjective Symptoms/Limitations Symptoms: S: I can make a fist but I can't open my fingers.  Pain Assessment Currently in Pain?: No/denies  Precautions/Restrictions  Precautions Precautions: Fall Restrictions Weight Bearing Restrictions: No  Exercise/Treatments Neurological Re-education Exercises Shoulder Flexion: AAROM;10 reps;Supine Shoulder Protraction: AAROM;10 reps;Supine Shoulder Horizontal ABduction: AAROM;5 reps;Supine Elbow Flexion: AROM;5 reps;Supine Elbow Extension: AROM;5 reps;Supine  Development of Reach  Development of Reach: Hand over hand assistance Reaching to Waist: Standing at table patient removed large pegs from pegboard and placed in container on table top focusing on development of reaching at waist height and grasp and release of hand. Patient given mod hand over hand assist for majority of task. Patient also require max vc's for technique and direction during task for optimal performance. Max difficulty initially with Mod difficulty towards of end task.  Reaching to Shoulder Height: Utilized velrco numbers on wall no higher than shoulder height to increase functional reaching technique. patient was educated to be square with the wall and perform shoulder flexion versus bending laterally to the left and performing shoulder Abduction when reaching forward for items. Mod vc's for technique. Light hand over hand assist to help steady LUE during task.    Occupational Therapy Assessment and Plan OT Assessment and Plan Clinical Impression Statement: A: Pt complained of pain in right shoulder when completing AAROM supine  exercises. No subluxation noted. Patient needed a lot of vc's to relax shoulder during grasp and release task. Patient attempted to complete majority of grasp and release tasks with substitution patterns and techniques.  OT Plan: P: Discuss pt's current insurance - how many visits allotted per year. Cont. to work on grasp and release activities and functional reaching. Work on decreasing substitution patterns. Kinesiotape to right shoulder for pain management.    Goals Home Exercise Program Pt/caregiver will Perform Home Exercise Program: For increased strengthening PT Goal: Perform Home Exercise Program - Progress: Met Short Term Goals Short Term Goal 1: Pt will be educated on HEP. Short Term Goal 2: Pt will be educated on use and wear of hand splint to decrease flexor tone. Short Term Goal 3: Pt will increase use of RUE to engage in AAROM in daily activities, 25% of the time. Short Term Goal 4: Pt will increase AROM in right shoulder to 25 degress of flexion to improve use of RUE in ADLs. Short Term Goal 5: Pt will don RLE AFO and shoe with modified independence Short Term Goal 6: Pt will tolerate weightbearing on RUE for 1 minute to improve use of RUE in ADLs Short Term Goal 7: Pt will tolerate wear of hand splint for 6 hours per day, with no signs/symptoms of skin breakdown Long Term Goals Long Term Goal 1: Pt will increase use of RUE to engage in AROM in daily activities, 50% of the time. Long Term Goal 1 Progress: Progressing toward goal Long Term Goal 2: Pt will increase AROM in right shoulder to 45 degress of flexion to improve use of RUE in ADLs. Long Term Goal 3: Pt will tolerate weightbearing on RUE for 5 minutes to improve use of RUE in ADLs Long Term Goal 4: Pt will attain  3+/5 strength in RUE to engage in daily acticities w RUE. Long Term Goal 4 Progress: Progressing toward goal Long Term Goal 5: Pt will don/doff hand splint w modified independence. (Pt no longer needs splint at  night due to hand no longer tightening during sleep) Additional Long Term Goals?: Yes Long Term Goal 6: Pt will increase AROM in right shoulder to 90 degress of flexion to improve use of RUE in ADLs. Long Term Goal 6 Progress: Progressing toward goal Long Term Goal 7: Pt will improve right elbow extension to 75% range to imrpove ability to puch up from surfaces. Long Term Goal 7 Progress: Progressing toward goal Long Term Goal 8: Pt will improve digit extension to minimal in order to faciliate relase of objects. Long Term Goal 8 Progress: Progressing toward goal  Problem List Patient Active Problem List   Diagnosis Date Noted  . Spastic hemiplegia affecting dominant side 05/23/2013  . Difficulty in walking(719.7) 03/08/2013  . Right leg weakness 03/08/2013  . Balance problems 03/08/2013  . Weakness of right upper extremity 03/08/2013  . Knee pain, right anterior 02/15/2013  . CVA (cerebral infarction) 02/01/2013  . Gout 08/05/2012  . HTN (hypertension) 08/05/2012    End of Session Activity Tolerance: Patient tolerated treatment well General Behavior During Therapy: [facility] for tasks assessed/performed   Ailene Ravel, OTR/L,CBIS   06/06/2013, 4:38 PM

## 2013-06-08 ENCOUNTER — Ambulatory Visit (HOSPITAL_COMMUNITY)
Admission: RE | Admit: 2013-06-08 | Discharge: 2013-06-08 | Disposition: A | Discharge status: Home / Self Care | Payer: BC Managed Care – PPO | Source: Ambulatory Visit | Attending: Family Medicine | Admitting: Family Medicine

## 2013-06-08 DIAGNOSIS — R279 Unspecified lack of coordination: Secondary | ICD-10-CM | POA: Insufficient documentation

## 2013-06-08 DIAGNOSIS — R262 Difficulty in walking, not elsewhere classified: Secondary | ICD-10-CM | POA: Insufficient documentation

## 2013-06-08 DIAGNOSIS — I69998 Other sequelae following unspecified cerebrovascular disease: Secondary | ICD-10-CM | POA: Insufficient documentation

## 2013-06-08 DIAGNOSIS — R269 Unspecified abnormalities of gait and mobility: Secondary | ICD-10-CM | POA: Insufficient documentation

## 2013-06-08 DIAGNOSIS — M6281 Muscle weakness (generalized): Secondary | ICD-10-CM | POA: Insufficient documentation

## 2013-06-08 DIAGNOSIS — IMO0001 Reserved for inherently not codable concepts without codable children: Secondary | ICD-10-CM | POA: Insufficient documentation

## 2013-06-08 NOTE — Progress Notes (Signed)
Physical Therapy Treatment Patient Details  Name: Bruce Guerrero MRN: [id number] Date of Birth: 1960-10-29  Today's Date: 06/08/2013 Time: 0930-1020 Visit#: 35 of 35  Re-eval: 07/06/13 Authorization: BCBS   Charges:  Gait [phone] (15'), therex [phone] (32')   Subjective: Symptoms/Limitations Symptoms: Pt states he's been working on his exercises at home.  States he has not been doing much outdoor walking, only as needed. Pain Assessment Currently in Pain?: No/denies   Exercise/Treatments Standing Gait Training: outdoor grass and sidewalk, curb negotiation, up and down ramp with SPC Seated Other Seated Knee Exercises: Baps ; L2 x 10 A/P, 5 reps CW/CCW Supine Heel Slides: 2 sets;10 reps Other Supine Knee Exercises: PROM for DF  Sidelying Hip ABduction: Right;2 sets;10 reps Other Sidelying Knee Exercises: hip extension with board 2X10 reps Prone  Hamstring Curl: 2 sets;10 reps Other Prone Exercises: TKE 2 sets 10 reps   Weight Bearing Technique Weight Bearing Technique: Yes RUE Weight Bearing Technique: Extended arm seated  Physical Therapy Assessment and Plan PT Assessment and Plan Clinical Impression Statement: Focused session on safety with outdoor ambulation, continued strengthening of hip and ankle musculature.  CW/CCW motions with BAPS continues to be most difficult for patient.  Requires cues for proper breathing with therex and isolation of muscles.  No LOB with outdoor ambulation and able to complete curb negotiation in correct sequence without cues. PT Plan: Per evaluating therapist, continue to focus on strengthening hip extensor, hip abductors and ankle musculature.     Problem List Patient Active Problem List   Diagnosis Date Noted  . Spastic hemiplegia affecting dominant side 05/23/2013  . Difficulty in walking(719.7) 03/08/2013  . Right leg weakness 03/08/2013  . Balance problems 03/08/2013  . Weakness of right upper extremity 03/08/2013  . Knee  pain, right anterior 02/15/2013  . CVA (cerebral infarction) 02/01/2013  . Gout 08/05/2012  . HTN (hypertension) 08/05/2012      Lurena NidaAmy B Frazier, PTA/CLT 06/08/2013, 10:53 AM

## 2013-06-10 ENCOUNTER — Ambulatory Visit (HOSPITAL_COMMUNITY)
Admission: RE | Admit: 2013-06-10 | Discharge: 2013-06-10 | Disposition: A | Discharge status: Home / Self Care | Payer: BC Managed Care – PPO | Source: Ambulatory Visit | Attending: Family Medicine | Admitting: Family Medicine

## 2013-06-10 DIAGNOSIS — R262 Difficulty in walking, not elsewhere classified: Secondary | ICD-10-CM

## 2013-06-10 DIAGNOSIS — R2689 Other abnormalities of gait and mobility: Secondary | ICD-10-CM

## 2013-06-10 DIAGNOSIS — R29898 Other symptoms and signs involving the musculoskeletal system: Secondary | ICD-10-CM

## 2013-06-10 NOTE — Progress Notes (Signed)
Physical Therapy Treatment Patient Details  Name: Hazle QuantWilliam S Stargell MRN: [id number] Date of Birth: 07-18-1960  Today's Date: 06/10/2013 Time: 0930-1030 PT Time Calculation (min): 60 min  Visit#: 36 of 46  Re-eval: 07/06/13  charge:  There ex 0930-1030  Authorization: BCBS   Subjective: Symptoms/Limitations Symptoms: Pt has no complaint./ Pain Assessment Currently in Pain?: No/denies    Exercise/Treatments   Aerobic Stationary Bike: nustep L5 x 10   Standing Knee Flexion: Right;15 reps (focusing on no trunk motion.) Forward Lunges: Right;10 reps Side Lunges: Right;10 reps SLS: x5 Other Standing Knee Exercises: standing Lt side against wall abduction x 10 sec x 10 Seated Other Seated Knee Exercises: sit to stand no UE Lt LE on Bosu  Other Seated Knee Exercises: Baps L2 x 5; L3 x 5 therapist assisted  Physical Therapy Assessment and Plan PT Assessment and Plan Clinical Impression Statement: Alll exercises needed therapist facilitation to prevent substitutional patterns.  Pt needed several short rest breaks.  Pt needs reminder when ambulating with cane to raise heel prior to advancing Lt LE.   PT Plan: Pt balance is good remaining deficits in walking are related to strength.  Continue with gt and strength focus.    Goals  progressing  Problem List Patient Active Problem List   Diagnosis Date Noted  . Spastic hemiplegia affecting dominant side 05/23/2013  . Difficulty in walking(719.7) 03/08/2013  . Right leg weakness 03/08/2013  . Balance problems 03/08/2013  . Weakness of right upper extremity 03/08/2013  . Knee pain, right anterior 02/15/2013  . CVA (cerebral infarction) 02/01/2013  . Gout 08/05/2012  . HTN (hypertension) 08/05/2012    PT - End of Session Equipment Utilized During Treatment: Gait belt  GP    Aviraj Kentner,CINDY 06/10/2013, 11:09 AM

## 2013-06-13 ENCOUNTER — Ambulatory Visit (HOSPITAL_COMMUNITY)
Admission: RE | Admit: 2013-06-13 | Discharge: 2013-06-13 | Disposition: A | Discharge status: Home / Self Care | Payer: BC Managed Care – PPO | Source: Ambulatory Visit | Attending: Family Medicine | Admitting: Family Medicine

## 2013-06-13 DIAGNOSIS — R29898 Other symptoms and signs involving the musculoskeletal system: Secondary | ICD-10-CM

## 2013-06-13 NOTE — Progress Notes (Signed)
Occupational Therapy Treatment Patient Details  Name: Bruce Guerrero MRN: [id number] Date of Birth: 1960-08-12  Today's Date: 06/13/2013 Time: 1350-1430 OT Time Calculation (min): 40 min Manual therapy 1350-1410 20' Neuro reeducation 1410-1430 20'  Visit#: 63 of 44  Re-eval: 07/01/13    Authorization: BCBS  Authorization Time Period:    Authorization Visit#:   of    Subjective Symptoms/Limitations Symptoms: S:  My elbow gets to a certain point and then I cant straighten it anymore.  Pain Assessment Currently in Pain?: No/denies Pain Score: 3  Pain Location: Shoulder Pain Orientation: Right Pain Type: Chronic pain  Precautions/Restrictions     Exercise/Treatments Seated Elevation: AAROM;10 reps Retraction: AAROM;10 reps External Rotation: AAROM;15 reps (max facilitation ) Internal Rotation: AAROM;15 reps Abduction: AAROM;15 reps (to 90 ) Elbow flexion, extension, supination, pronation, wrist flexion, extension 10 times AAROM-AROM  Neurological Re-education Exercises    Weight Bearing Exercises Seated with weight on hand: Seated EOM, Pt bearing weight through RUE while recieving directions for therapy.  Development of Reach  Development of Reach: Hand over hand assistance Hand over Hand Assistance while Reaching: into flexion and scaption 10 times each with increased pain in anterior shoulder Reaching to Waist: seated edge of mat reaching for tennis ball with mod difficulty  Grasp and Release Grasp and Release:  (with tennis ball, able to grasp and release independently)     Manual Therapy Manual Therapy: Other (comment) Myofascial Release: Myofascial release to right upper arm, scapular, upper trapezius, biceps region to decrease fascial restrictions. Other Manual Therapy: kinesiotaping to right shoulder to decrease subluxation, taped from insertion to origin with y shaped tape.  educated patient on wear and care and contrainidications, patient able to verbalize  instructions back to therapist.   Occupational Therapy Assessment and Plan OT Assessment and Plan Clinical Impression Statement: A:  Kinesiotaped to decrease pain associated with subluxation.   OT Plan: P:  follow up on taping technique.  if not pain relieving, attempt taping for anterior shoulder.  continue to improve independence with combining elbow and wrist extension with functional reaching.    Goals Short Term Goals Short Term Goal 1: Pt will be educated on HEP. Short Term Goal 2: Pt will be educated on use and wear of hand splint to decrease flexor tone. Short Term Goal 3: Pt will increase use of RUE to engage in AAROM in daily activities, 25% of the time. Short Term Goal 4: Pt will increase AROM in right shoulder to 25 degress of flexion to improve use of RUE in ADLs. Short Term Goal 5: Pt will don RLE AFO and shoe with modified independence Short Term Goal 6: Pt will tolerate weightbearing on RUE for 1 minute to improve use of RUE in ADLs Short Term Goal 7: Pt will tolerate wear of hand splint for 6 hours per day, with no signs/symptoms of skin breakdown Long Term Goals Long Term Goal 1: Pt will increase use of RUE to engage in AROM in daily activities, 50% of the time. Long Term Goal 1 Progress: Progressing toward goal Long Term Goal 2: Pt will increase AROM in right shoulder to 45 degress of flexion to improve use of RUE in ADLs. Long Term Goal 3: Pt will tolerate weightbearing on RUE for 5 minutes to improve use of RUE in ADLs Long Term Goal 4: Pt will attain 3+/5 strength in RUE to engage in daily acticities w RUE. Long Term Goal 4 Progress: Progressing toward goal Long Term Goal 5: Pt will  don/doff hand splint w modified independence. (Pt no longer needs splint at night due to hand no longer tightening during sleep) Long Term Goal 5 Progress: Progressing toward goal Additional Long Term Goals?: Yes Long Term Goal 6: Pt will increase AROM in right shoulder to 90 degress of  flexion to improve use of RUE in ADLs. Long Term Goal 6 Progress: Progressing toward goal Long Term Goal 7: Pt will improve right elbow extension to 75% range to imrpove ability to puch up from surfaces. Long Term Goal 7 Progress: Progressing toward goal Long Term Goal 8: Pt will improve digit extension to minimal in order to faciliate relase of objects. Long Term Goal 8 Progress: Progressing toward goal  Problem List Patient Active Problem List   Diagnosis Date Noted  . Spastic hemiplegia affecting dominant side 05/23/2013  . Difficulty in walking(719.7) 03/08/2013  . Right leg weakness 03/08/2013  . Balance problems 03/08/2013  . Weakness of right upper extremity 03/08/2013  . Knee pain, right anterior 02/15/2013  . CVA (cerebral infarction) 02/01/2013  . Gout 08/05/2012  . HTN (hypertension) 08/05/2012    End of Session Activity Tolerance: Patient tolerated treatment well General Behavior During Therapy: [name] assessed/performed  GO    Shirlean MylarBethany H. Murray, OTR/L  06/13/2013, 4:17 PM

## 2013-06-13 NOTE — Progress Notes (Signed)
Physical Therapy Treatment Patient Details  Name: Bruce Guerrero MRN: [id number] Date of Birth: November 02, 1960  Today's Date: 06/13/2013 Time: 1301-1350 PT Time Calculation (min): 49 min Charges: Gait training x 10' Therex x 15' NMR x 20' Visit#: 37 of 46  Re-eval: 07/06/13  Authorization: BCBS     Subjective: Symptoms/Limitations Symptoms: Pt reports compliance with exercises at home. Pain Assessment Currently in Pain?: No/denies Pain Score: 3  Pain Location: Shoulder Pain Orientation: Right Pain Type: Chronic pain   Exercise/Treatments Standing Knee Flexion: Right;15 reps;Limitations Knee Flexion Limitations: With manual assistance for form Forward Lunges: 10 reps;Both Side Lunges: 10 reps;Both Gait Training: Indoors with SPC focusing on knee flexion, plantar flexion and extensions on RLE  Seated Other Seated Knee Exercises: Sit to stand no UE Lt LE on Bosu x 10 Other Seated Knee Exercises: Baps L2 CCW/CW x 5 ; L3 R/L A/P x 5 therapist assisted  Balance Exercises Standing SLS: 3 reps;30 secs;Hand held assist (HHA) 1;Limitations SLS Limitations: RLE Heel Raises: 10 reps Toe Raise: 10 reps Sit to Stand: Limitations Sit to Stand Limitations: 10x with LLE     Physical Therapy Assessment and Plan PT Assessment and Plan Clinical Impression Statement: SPTA facilitated balance and strengthening exercises to improve pt's balance, ROM and mobility to increase pt's functional activities. Pt requires VCs for proper posture during exercises. Pt needed some assistance with the sit to stand exercise in order to increase weight shift RLE. Pt was motivated to complete therex. Pt needed verbal cuing during gait training to facilitate reciprocal hamstring/quad contraction on RLE during gait sequence. Pt required multimodal cuing with BAPS exercises to incorporate appropriate ankle muscles. This entire session was guided, instructed, and directly supervised by Seth Bakeebekah Tamanika Heiney, PTA. PT  Plan: Continue to progress functional strength, balance and gait mechanics. Also, continue to progress activities to improve ankle strategy.      Problem List Patient Active Problem List   Diagnosis Date Noted  . Spastic hemiplegia affecting dominant side 05/23/2013  . Difficulty in walking(719.7) 03/08/2013  . Right leg weakness 03/08/2013  . Balance problems 03/08/2013  . Weakness of right upper extremity 03/08/2013  . Knee pain, right anterior 02/15/2013  . CVA (cerebral infarction) 02/01/2013  . Gout 08/05/2012  . HTN (hypertension) 08/05/2012    PT - End of Session Equipment Utilized During Treatment: Gait belt Activity Tolerance: Patient tolerated treatment well General Behavior During Therapy: [facility] assessed/per[facility]  Seth Bakeebekah Mairin Lindsley, PTA 06/13/2013, 3:55 PM

## 2013-06-15 ENCOUNTER — Ambulatory Visit (HOSPITAL_COMMUNITY)
Admission: RE | Admit: 2013-06-15 | Discharge: 2013-06-15 | Disposition: A | Discharge status: Home / Self Care | Payer: BC Managed Care – PPO | Source: Ambulatory Visit | Attending: Family Medicine | Admitting: Family Medicine

## 2013-06-15 NOTE — Progress Notes (Addendum)
Physical Therapy Treatment Patient Details  Name: Bruce Guerrero MRN: [id number] Date of Birth: 08-16-1960  Today's Date: 06/15/2013 Time: 7829-56211301-1345 PT Time Calculation (min): 44 min Charges: Gait training x 10'(1301-1311) therex x 20'(1312-1332) NMR x 30'(8657-846910'(1335-1345)  Visit#: 38 of 46  Re-eval: 07/06/13  Authorization: BCBS    Subjective: Symptoms/Limitations Symptoms: Pt states that he has been working on his ankle exercises somewhat. Pain Assessment Currently in Pain?: Yes Pain Score: 1  Pain Location: Shoulder Pain Orientation: Right Pain Type: Chronic pain   Exercise/Treatments Standing Heel Raises: 10 reps;Limitations Heel Raises Limitations: B toe raises x 10 reps Knee Flexion: Right;15 reps;Limitations Knee Flexion Limitations: With manual assistance for form Forward Lunges: 10 reps;Both Side Lunges: 10 reps;Both SLS: RLE 30 sec x 3 reps Gait Training: Indoors with SPC focusing on knee flexion, plantar flexion and extensions on RLE  Seated Other Seated Knee Exercises: Sit to stand no UE Lt LE on Bosu x 10 Other Seated Knee Exercises: Baps L2 CCW/CW R/L A/P x 10 therapist assisted Prone  Hamstring Curl: 10 reps;1 set   Manual Therapy Manual Therapy: Myofascial release Myofascial Release: Myofascial release to right upper arm, scapular, upper trapezius, biceps region to decrease fascial restrictions.    Physical Therapy Assessment and Plan PT Assessment and Plan Clinical Impression Statement: SPTA facilitated balance, strengthening exercises to improve functional strength and balance. Pt completed therex and balance exercises well after initial cueing and demo. Knee flexion has improved during gait training but pt requires multimodal cueing to focus on knee flexion and plantarflexion. Continued BAPS exercises to improve ankle mobility and ankle muscle control. This entire session was guided, instructed, and directly supervised by Seth Bakeebekah Keyaria Lawson, PTA.  PT Plan:  Continue with strengthening and balance exercises per PT POC. Begin eversion/inversion with towel on RLE next session.    Problem List Patient Active Problem List   Diagnosis Date Noted  . Spastic hemiplegia affecting dominant side 05/23/2013  . Difficulty in walking(719.7) 03/08/2013  . Right leg weakness 03/08/2013  . Balance problems 03/08/2013  . Weakness of right upper extremity 03/08/2013  . Knee pain, right anterior 02/15/2013  . CVA (cerebral infarction) 02/01/2013  . Gout 08/05/2012  . HTN (hypertension) 08/05/2012    PT - End of Session Equipment Utilized During Treatment: Gait belt Activity Tolerance: Patient tolerated treatment well General Behavior During Therapy: [facility] for tasks assessed/performed  Florence CannerKelsey Parsons, SPTA Seth Bakeebekah Alizey Noren, PTA 06/15/2013, 3:36 PM

## 2013-06-15 NOTE — Progress Notes (Signed)
Occupational Therapy Treatment Patient Details  Name: Bruce Guerrero MRN: [id number] Date of Birth: 01-17-1961  Today's Date: 06/15/2013 Time: 1351-1440 OT Time Calculation (min): 49 min Manual 1351-1411 (20') Neuro Re-Ed 1411-1440 (29')  Visit#: 36 of 44  Re-eval: 07/01/13    Authorization: [employer]  Authorization Time Period:    Authorization Visit#:   of    Subjective Symptoms/Limitations Symptoms: "Its still got some pain, but its not nearly as bad." Pain Assessment Currently in Pain?: Yes Pain Score: 1  Pain Location: Shoulder Pain Orientation: Right Pain Type: Chronic pain  Precautions/Restrictions     Exercise/Treatments Seated Other Seated Exercises: HEP towel sliding exercises for shouler and elbow stretching/AAROM. Flexion, abduction, horizontal abduction, and ER/IR. Neurological Re-education Exercises Shoulder Flexion: PROM;5 reps Shoulder ABduction: PROM;5 reps Shoulder Protraction: PROM;5 reps;AAROM;10 reps Shoulder External Rotation: PROM;5 reps Shoulder Internal Rotation: PROM;5 reps Elbow Flexion: PROM;10 reps;AAROM Elbow Extension: PROM;AAROM;10 reps Forearm Supination: PROM;10 reps;AAROM Forearm Pronation: PROM;10 reps;AROM     Manual Therapy Manual Therapy: Other (comment) Myofascial Release: Myofascial release to right upper arm, scapular, upper trapezius, biceps region to decrease fascial restrictions Other Manual Therapy: Kinesiotaping to right shoulder to decrease subluxation, taped from insertion to origin with y shaped tape. pt verbalized understanding of care from previous wear.  Occupational Therapy Assessment and Plan OT Assessment and Plan Clinical Impression Statement: Educated pt on towel slides for HEP for AAROM to shouder and elbow.  Re-applied kinesiotaping  - pt reported loosening of tape but good results with decreased pain with increased support of shoulder.  Discussed insruance converage with patinet - our office to continue  trying to get in touch with office to request further visits. OT Plan: P:  follow up on taping technique. continue to improve independence with combining elbow and wrist extension with functional reaching.    Goals Short Term Goals Short Term Goal 1: Pt will be educated on HEP. Short Term Goal 1 Progress: Met Short Term Goal 2: Pt will be educated on use and wear of hand splint to decrease flexor tone. Short Term Goal 2 Progress: Met Short Term Goal 3: Pt will increase use of RUE to engage in AAROM in daily activities, 25% of the time. Short Term Goal 3 Progress: Met Short Term Goal 4: Pt will increase AROM in right shoulder to 25 degress of flexion to improve use of RUE in ADLs. Short Term Goal 4 Progress: Met Short Term Goal 5: Pt will don RLE AFO and shoe with modified independence Short Term Goal 5 Progress: Discontinued (comment) Short Term Goal 6: Pt will tolerate weightbearing on RUE for 1 minute to improve use of RUE in ADLs Short Term Goal 6 Progress: Met Short Term Goal 7: Pt will tolerate wear of hand splint for 6 hours per day, with no signs/symptoms of skin breakdown Short Term Goal 7 Progress: Met Long Term Goals Long Term Goal 1: Pt will increase use of RUE to engage in AROM in daily activities, 50% of the time. Long Term Goal 1 Progress: Progressing toward goal Long Term Goal 2 Progress: Met Long Term Goal 3: Pt will tolerate weightbearing on RUE for 5 minutes to improve use of RUE in ADLs Long Term Goal 3 Progress: Met Long Term Goal 4: Pt will attain 3+/5 strength in RUE to engage in daily acticities w RUE. Long Term Goal 4 Progress: Progressing toward goal Long Term Goal 5: Pt will don/doff hand splint w modified independence. Long Term Goal 5 Progress: Discontinued (comment) Long  Term Goal 6: Pt will increase AROM in right shoulder to 90 degress of flexion to improve use of RUE in ADLs. Long Term Goal 6 Progress: Progressing toward goal Long Term Goal 7: Pt will  improve right elbow extension to 75% range to imrpove ability to puch up from surfaces. Long Term Goal 7 Progress: Progressing toward goal Long Term Goal 8: Pt will improve digit extension to minimal in order to faciliate relase of objects. Long Term Goal 8 Progress: Progressing toward goal  Problem List Patient Active Problem List   Diagnosis Date Noted  . Spastic hemiplegia affecting dominant side 05/23/2013  . Difficulty in walking(719.7) 03/08/2013  . Right leg weakness 03/08/2013  . Balance problems 03/08/2013  . Weakness of right upper extremity 03/08/2013  . Knee pain, right anterior 02/15/2013  . CVA (cerebral infarction) 02/01/2013  . Gout 08/05/2012  . HTN (hypertension) 08/05/2012    End of Session Activity Tolerance: Patient tolerated treatment well General Behavior During Therapy: [facility] for tasks assessed/performed OT Plan of Care OT Home Exercise Plan: Towel slides for AAROM of shoulder and elbow movements. OT Patient Instructions: Handout provided (scaned). Pt verbalized and demonstrated good understanding. Consulted and Agree with Plan of Care: Patient  [place], [place], OTR/L [phone]  06/15/2013, 4:37 PM

## 2013-06-17 ENCOUNTER — Ambulatory Visit (HOSPITAL_COMMUNITY)
Admission: RE | Admit: 2013-06-17 | Discharge: 2013-06-17 | Disposition: A | Discharge status: Home / Self Care | Payer: BC Managed Care – PPO | Source: Ambulatory Visit | Attending: Family Medicine | Admitting: Family Medicine

## 2013-06-17 DIAGNOSIS — R29898 Other symptoms and signs involving the musculoskeletal system: Secondary | ICD-10-CM

## 2013-06-17 NOTE — Progress Notes (Signed)
Occupational Therapy Treatment Patient Details  Name: Bruce Guerrero MRN: [id number] Date of Birth: May 26, 1960  Today's Date: 06/17/2013 Time: 1105-1150 OT Time Calculation (min): 45 min Manual therapy 15' Neuro reeducation 30' Visit#: 37 of 44  Re-eval: 07/01/13    Authorization: [employer]   Subjective  S:  I like the tape, I feel like I can lay on that arm now with it on.  Pain Assessment Currently in Pain?: Yes Pain Score: 2  Pain Location: Shoulder Pain Orientation: Right Pain Type: Acute pain  Precautions/Restrictions     Exercise/Treatments Neurological Re-education Exercises Shoulder Flexion: AAROM;PROM;10 reps Shoulder ABduction: PROM;AAROM;10 reps Shoulder Protraction: PROM;AAROM;10 reps Shoulder External Rotation: PROM;AAROM;10 reps Shoulder Internal Rotation: PROM;AAROM;10 reps Elbow Flexion: PROM;AAROM;10 reps Elbow Extension: PROM;AAROM;10 reps Forearm Supination: PROM;AAROM;10 reps Forearm Pronation: PROM;AAROM;10 reps Wrist Flexion: PROM;AAROM;10 reps Wrist Extension: PROM;AROM;10 reps  Weight Bearing Exercises Seated with weight on hand: Seated EOM, Pt bearing weight through RUE while recieving directions for therapy.  Development of Reach  Development of Reach: Hand over hand assistance Hand over Hand Assistance while Reaching: seated at table facilitated patient reaching forward with his right hand to pick up a small cube and place cube in cup.  Patient requires facilitation for shoulder flexion 50% of time, elbow extension 100% of time, and was able to maintain neutral to slight extension in wrist this date volitionally.   Grasp and Release Grasp and Release: Thumb Opposition Thumb Opposition: Patient able to grasp light resist egg gripper 5 times holding 5 seconds with forearm pronated.  with therapist facilitation then held forearm neutral and completed 5 times holding 5 seconds in prep for picking up and maintaining his grip on an item.   Transitioned to picking up small cubes with pincer grasp, patient able to to so with min-mod difficulty and drop into a cup with max difficulty.  At end of session, patient was able to pick up a plastic cup using a spherical grasp independently with grasp and with mod facilitation at elbow.   Fine Motor Coordination       Manual Therapy Manual Therapy: Other (comment) Myofascial Release: Myofascial release to right upper arm, scapular, upper trapezius, biceps region to decrease fascial restrictions Other Manual Therapy: Myofascial release to right upper arm, scapular, upper trapezius, biceps region to decrease fascial restrictions  Occupational Therapy Assessment and Plan OT Assessment and Plan Clinical Impression Statement: A: Patient demonstrated increased volitional wrist extension with functional reaching and grasp and release this date.  OT Plan: P:  Increase volitional elbow extension with functional reaching, discuss the importance of slowing down to allow for RUE movement before LUE assists.    Goals Short Term Goals Short Term Goal 1: Pt will be educated on HEP. Short Term Goal 2: Pt will be educated on use and wear of hand splint to decrease flexor tone. Short Term Goal 3: Pt will increase use of RUE to engage in AAROM in daily activities, 25% of the time. Short Term Goal 4: Pt will increase AROM in right shoulder to 25 degress of flexion to improve use of RUE in ADLs. Short Term Goal 5: Pt will don RLE AFO and shoe with modified independence Short Term Goal 6: Pt will tolerate weightbearing on RUE for 1 minute to improve use of RUE in ADLs Short Term Goal 7: Pt will tolerate wear of hand splint for 6 hours per day, with no signs/symptoms of skin breakdown Long Term Goals Long Term Goal 1: Pt will increase use of  RUE to engage in AROM in daily activities, 50% of the time. Long Term Goal 3: Pt will tolerate weightbearing on RUE for 5 minutes to improve use of RUE in ADLs Long Term  Goal 4: Pt will attain 3+/5 strength in RUE to engage in daily acticities w RUE. Long Term Goal 5: Pt will don/doff hand splint w modified independence. Long Term Goal 6: Pt will increase AROM in right shoulder to 90 degress of flexion to improve use of RUE in ADLs. Long Term Goal 7: Pt will improve right elbow extension to 75% range to imrpove ability to puch up from surfaces. Long Term Goal 8: Pt will improve digit extension to minimal in order to faciliate relase of objects.  Problem List Patient Active Problem List   Diagnosis Date Noted  . Spastic hemiplegia affecting dominant side 05/23/2013  . Difficulty in walking(719.7) 03/08/2013  . Right leg weakness 03/08/2013  . Balance problems 03/08/2013  . Weakness of right upper extremity 03/08/2013  . Knee pain, right anterior 02/15/2013  . CVA (cerebral infarction) 02/01/2013  . Gout 08/05/2012  . HTN (hypertension) 08/05/2012    End of Session Activity Tolerance: Patient tolerated treatment well General Behavior During Therapy: [facility] for tasks assessed/performed  GO    Shirlean Mylar, OTR/L  06/17/2013, 3:48 PM

## 2013-06-20 ENCOUNTER — Ambulatory Visit (HOSPITAL_COMMUNITY): Payer: BC Managed Care – PPO

## 2013-06-22 ENCOUNTER — Ambulatory Visit (HOSPITAL_COMMUNITY): Payer: BC Managed Care – PPO

## 2013-06-24 ENCOUNTER — Ambulatory Visit (HOSPITAL_COMMUNITY): Payer: BC Managed Care – PPO

## 2013-06-27 ENCOUNTER — Ambulatory Visit (INDEPENDENT_AMBULATORY_CARE_PROVIDER_SITE_OTHER): Payer: BC Managed Care – PPO | Admitting: General Practice

## 2013-06-27 ENCOUNTER — Inpatient Hospital Stay (HOSPITAL_COMMUNITY): Admission: RE | Admit: 2013-06-27 | Payer: BC Managed Care – PPO | Source: Ambulatory Visit

## 2013-06-27 ENCOUNTER — Encounter: Payer: Self-pay | Admitting: General Practice

## 2013-06-27 VITALS — BP 135/76 | HR 66 | Temp 98.7°F | Ht 64.0 in | Wt 188.0 lb

## 2013-06-27 DIAGNOSIS — F32A Depression, unspecified: Secondary | ICD-10-CM

## 2013-06-27 DIAGNOSIS — I10 Essential (primary) hypertension: Secondary | ICD-10-CM

## 2013-06-27 DIAGNOSIS — F329 Major depressive disorder, single episode, unspecified: Secondary | ICD-10-CM

## 2013-06-27 DIAGNOSIS — F3289 Other specified depressive episodes: Secondary | ICD-10-CM

## 2013-06-27 DIAGNOSIS — I2510 Atherosclerotic heart disease of native coronary artery without angina pectoris: Secondary | ICD-10-CM

## 2013-06-27 DIAGNOSIS — E785 Hyperlipidemia, unspecified: Secondary | ICD-10-CM

## 2013-06-27 MED ORDER — CARVEDILOL 6.25 MG PO TABS: 6.2500 mg | ORAL_TABLET | Freq: Two times a day (BID) | ORAL | Status: DC

## 2013-06-27 MED ORDER — SERTRALINE HCL 50 MG PO TABS: 50.0000 mg | ORAL_TABLET | Freq: Every day | ORAL | Status: DC

## 2013-06-27 MED ORDER — ATORVASTATIN CALCIUM 80 MG PO TABS: 80.0000 mg | ORAL_TABLET | Freq: Every day | ORAL | Status: DC

## 2013-06-27 MED ORDER — AMLODIPINE BESYLATE 10 MG PO TABS: 10.0000 mg | ORAL_TABLET | Freq: Every day | ORAL | Status: DC

## 2013-06-27 MED ORDER — CLOPIDOGREL BISULFATE 75 MG PO TABS
75.0000 mg | ORAL_TABLET | Freq: Every day | ORAL | Status: DC
Start: 2013-06-27 — End: 2013-06-29

## 2013-06-27 NOTE — Patient Instructions (Signed)

## 2013-06-27 NOTE — Progress Notes (Signed)
   Subjective:    Patient ID: Bruce Guerrero, male    DOB: 1960-08-14, 53 y.o.   MRN: [id number]  HPI Patient presents today for chronic health condition follow up. History of HTN, HLD, CAD, depression, CVA, and gout. Reports taking medications as directed, denies taking blood pressure at home. Working on eating a healthier diet. Jairen verbalized seeing an improvement in strength of right upper and lower extremities and feels like physical therapy is very effective.      Review of Systems  Constitutional: Negative for fever and chills.  Eyes: Negative for pain.  Respiratory: Negative for chest tightness and shortness of breath.   Cardiovascular: Negative for chest pain and palpitations.  Neurological: Positive for weakness. Negative for dizziness and numbness.       Right side weakness due to stroke  All other systems reviewed and are negative.      Objective:   Physical Exam  Constitutional: He is oriented to person, place, and time. He appears well-developed and well-nourished.  HENT:  Head: Normocephalic and atraumatic.  Eyes: EOM are normal. Pupils are equal, round, and reactive to light.  Neck: Normal range of motion. Neck supple. No thyromegaly present.  Cardiovascular: Normal rate, regular rhythm and normal heart sounds.   Pulmonary/Chest: Effort normal and breath sounds normal. No respiratory distress. He exhibits no tenderness.  Abdominal: Soft. Bowel sounds are normal. He exhibits no distension. There is no tenderness.  Lymphadenopathy:    He has no cervical adenopathy.  Neurological: He is alert and oriented to person, place, and time.  Strength of right upper extremity is 2/5 and right lower extremity is 3/5.  Skin: Skin is warm and dry.  Psychiatric: He has a normal mood and affect.          Assessment & Plan:  1. HTN (hypertension)  - CMP14+EGFR - amLODipine (NORVASC) 10 MG tablet; Take 1 tablet (10 mg total) by mouth daily.  Dispense: 30 tablet;  Refill: 3 - carvedilol (COREG) 6.25 MG tablet; Take 1 tablet (6.25 mg total) by mouth 2 (two) times daily with a meal.  Dispense: 60 tablet; Refill: 3  2. HLD (hyperlipidemia)  - Lipid panel - atorvastatin (LIPITOR) 80 MG tablet; Take 1 tablet (80 mg total) by mouth daily at 6 PM.  Dispense: 30 tablet; Refill: 3  3. CAD (coronary artery disease)  - clopidogrel (PLAVIX) 75 MG tablet; Take 1 tablet (75 mg total) by mouth daily with breakfast.  Dispense: 30 tablet; Refill: 6  4. Depression  - sertraline (ZOLOFT) 50 MG tablet; Take 1 tablet (50 mg total) by mouth daily.  Dispense: 30 tablet; Refill: 6 -Continue all current medications Labs pending F/u in 3 months Discussed benefits of healthy eating Patient verbalized understanding Erby Pian, FNP-C

## 2013-06-28 LAB — CMP14+EGFR
ALT: 28 IU/L (ref 0–44)
AST: 20 IU/L (ref 0–40)
Albumin/Globulin Ratio: 2.2 (ref 1.1–2.5)
Albumin: 4.7 g/dL (ref 3.5–5.5)
Alkaline Phosphatase: 106 IU/L (ref 39–117)
BUN/Creatinine Ratio: 14 (ref 9–20)
BUN: 18 mg/dL (ref 6–24)
CALCIUM: 9.7 mg/dL (ref 8.7–10.2)
CO2: 23 mmol/L (ref 18–29)
CREATININE: 1.29 mg/dL — AB (ref 0.76–1.27)
Chloride: 100 mmol/L (ref 97–108)
GFR calc Af Amer: 73 mL/min/{1.73_m2} (ref >59)
GFR, EST NON AFRICAN AMERICAN: 63 mL/min/{1.73_m2} (ref >59)
Globulin, Total: 2.1 g/dL (ref 1.5–4.5)
Glucose: 106 mg/dL — ABNORMAL HIGH (ref 65–99)
POTASSIUM: 4.3 mmol/L (ref 3.5–5.2)
SODIUM: 140 mmol/L (ref 134–144)
TOTAL PROTEIN: 6.8 g/dL (ref 6.0–8.5)
Total Bilirubin: 0.5 mg/dL (ref 0.0–1.2)

## 2013-06-28 LAB — LIPID PANEL
Chol/HDL Ratio: 3.4 ratio units (ref 0.0–5.0)
Cholesterol, Total: 119 mg/dL (ref 100–199)
HDL: 35 mg/dL — AB (ref >39)
LDL Calculated: 56 mg/dL (ref 0–99)
Triglycerides: 142 mg/dL (ref 0–149)
VLDL CHOLESTEROL CAL: 28 mg/dL (ref 5–40)

## 2013-06-29 ENCOUNTER — Other Ambulatory Visit: Payer: Self-pay | Admitting: *Deleted

## 2013-06-29 ENCOUNTER — Ambulatory Visit (HOSPITAL_COMMUNITY): Payer: BC Managed Care – PPO

## 2013-06-29 DIAGNOSIS — F32A Depression, unspecified: Secondary | ICD-10-CM

## 2013-06-29 DIAGNOSIS — M109 Gout, unspecified: Secondary | ICD-10-CM

## 2013-06-29 DIAGNOSIS — F329 Major depressive disorder, single episode, unspecified: Secondary | ICD-10-CM

## 2013-06-29 DIAGNOSIS — E785 Hyperlipidemia, unspecified: Secondary | ICD-10-CM

## 2013-06-29 DIAGNOSIS — I2510 Atherosclerotic heart disease of native coronary artery without angina pectoris: Secondary | ICD-10-CM

## 2013-06-29 DIAGNOSIS — I10 Essential (primary) hypertension: Secondary | ICD-10-CM

## 2013-06-29 MED ORDER — COLCHICINE 0.6 MG PO TABS: 0.6000 mg | ORAL_TABLET | Freq: Every day | ORAL | Status: DC

## 2013-06-29 MED ORDER — CARVEDILOL 6.25 MG PO TABS
6.2500 mg | ORAL_TABLET | Freq: Two times a day (BID) | ORAL | Status: DC
Start: 2013-06-29 — End: 2013-08-11

## 2013-06-29 MED ORDER — CLOPIDOGREL BISULFATE 75 MG PO TABS: 75.0000 mg | ORAL_TABLET | Freq: Every day | ORAL | Status: DC

## 2013-06-29 MED ORDER — SERTRALINE HCL 50 MG PO TABS: 50.0000 mg | ORAL_TABLET | Freq: Every day | ORAL | Status: DC

## 2013-06-29 MED ORDER — AMLODIPINE BESYLATE 10 MG PO TABS: 10.0000 mg | ORAL_TABLET | Freq: Every day | ORAL | Status: DC

## 2013-06-29 MED ORDER — ATORVASTATIN CALCIUM 80 MG PO TABS: 80.0000 mg | ORAL_TABLET | Freq: Every day | ORAL | Status: DC

## 2013-07-01 ENCOUNTER — Other Ambulatory Visit: Payer: Self-pay | Admitting: General Practice

## 2013-07-01 ENCOUNTER — Ambulatory Visit (HOSPITAL_COMMUNITY): Payer: BC Managed Care – PPO

## 2013-07-01 ENCOUNTER — Telehealth: Payer: Self-pay | Admitting: General Practice

## 2013-07-01 DIAGNOSIS — M109 Gout, unspecified: Secondary | ICD-10-CM

## 2013-07-01 MED ORDER — PREDNISONE (PAK) 10 MG PO TABS: ORAL_TABLET | ORAL | Status: DC

## 2013-07-01 NOTE — Telephone Encounter (Signed)
Script sent to pharmacy.

## 2013-07-04 ENCOUNTER — Ambulatory Visit (HOSPITAL_COMMUNITY): Payer: BC Managed Care – PPO | Admitting: Physical Therapy

## 2013-07-05 ENCOUNTER — Ambulatory Visit (HOSPITAL_BASED_OUTPATIENT_CLINIC_OR_DEPARTMENT_OTHER): Payer: BC Managed Care – PPO | Admitting: Physical Medicine & Rehabilitation

## 2013-07-05 ENCOUNTER — Encounter: Payer: BC Managed Care – PPO | Attending: Physical Medicine & Rehabilitation

## 2013-07-05 ENCOUNTER — Encounter: Payer: Self-pay | Admitting: Physical Medicine & Rehabilitation

## 2013-07-05 VITALS — BP 155/82 | HR 60 | Resp 14 | Ht 64.0 in | Wt 191.4 lb

## 2013-07-05 DIAGNOSIS — I10 Essential (primary) hypertension: Secondary | ICD-10-CM | POA: Insufficient documentation

## 2013-07-05 DIAGNOSIS — I69959 Hemiplegia and hemiparesis following unspecified cerebrovascular disease affecting unspecified side: Secondary | ICD-10-CM | POA: Insufficient documentation

## 2013-07-05 DIAGNOSIS — G8110 Spastic hemiplegia affecting unspecified side: Secondary | ICD-10-CM

## 2013-07-05 NOTE — Progress Notes (Signed)
Subjective:    Patient ID: Bruce Guerrero, male    DOB: 06/25/1960, 53 y.o.   MRN: [id number] 53 year old right-handed male with history of hypertension,  independent prior to admission, working full-time. Admitted on February 01, 2013, with right-sided weakness and slurred speech. MRI of the  brain showed a moderately enlarged deep white matter infarction on the  left. MRA of the head with no large vessel occlusion identified.  Carotid Dopplers with no ICA stenosis. Echocardiogram with ejection  fraction of 60% and grade 1 diastolic dysfunction. The patient did not  receive tPA. Neurology Service was consulted, maintained on Plavix,  aspirin therapy, and later just Plavix alone  DATE OF ADMISSION: 02/08/2013  DATE OF DISCHARGE: 02/26/2013  HPI Last PT and OT at [facility] about 2 weeks ago. Awaiting insurance approval. No longer using AFO Still using straight cane  Pain Inventory Average Pain 0 Pain Right Now 0 My pain is no pain  In the last 24 hours, has pain interfered with the following? General activity 0 Relation with others 0 Enjoyment of life 0 What TIME of day is your pain at its worst? night Sleep (in general) Good  Pain is worse with: unsure Pain improves with: rest Relief from Meds: 0  Mobility walk without assistance use a walker how many minutes can you walk? 15 ability to climb steps?  no do you drive?  no  Function disabled: date disabled 02/01/13  Neuro/Psych trouble walking  Prior Studies Any changes since last visit?  no  Physicians involved in your care Any changes since last visit?  no   Family History  Problem Relation Age of Onset  . COPD Mother   . COPD Father   . Cancer Sister   . Hyperlipidemia Brother   . Diabetes Brother    History   Social History  . Marital Status: Single    Spouse Name: N/A    Number of Children: N/A  . Years of Education: N/A   Social History Main Topics  . Smoking status: Never  Smoker   . Smokeless tobacco: None  . Alcohol Use: No  . Drug Use: No  . Sexual Activity: None   Other Topics Concern  . None   Social History Narrative  . None   History reviewed. No pertinent past surgical history. Past Medical History  Diagnosis Date  . Gout   . Hypertension   . Stroke    BP 155/82  Pulse 60  Resp 14  Ht 5\' 4"  (1.626 m)  Wt 191 lb 6.4 oz (86.818 kg)  BMI 32.84 kg/m2  SpO2 95%  Opioid Risk Score:   Fall Risk Score: Moderate Fall Risk (6-13 points) (educated and handout on fall prevention in the home was given at previous appt)  Review of Systems  Musculoskeletal: Positive for gait problem.  All other systems reviewed and are negative.      Objective:   Physical Exam  Ambulates with straight cane no evidence of toe drag Right arm flexion at the elbow during ambulation Ashworth grade 2-3 at the finger flexors wrist flexors and bicep on the right side Strength is 3 minus at the deltoid, bicep, tricep, grip 2 minus at the finger extensor 5/5 on the left side 4/5 right hip flexor knee extensors 3 minus ankle dorsiflexor 5/5 unless a Sensation equal and normal in both upper and lower limbs     Assessment & Plan:  1. Deep white matter infarct left onset 02/01/2013.  Exam not significantly changed over the last 6 weeks. Starting to plateau. He still may get some function with additional outpatient therapy. He is independent with basic ADLs as well as mobility and would like to resume driving however does not have adequate foot control or hand control on the right side to do so safely. We discussed that he may have his car modified with left side accelerated or and left side controls in addition to spinner knob Patient would like to hold off on driving for now  Return to clinic 2-3 months

## 2013-07-05 NOTE — Progress Notes (Deleted)
   Subjective:    Patient ID: Bruce Guerrero, male    DOB: 17-Mar-1960, 53 y.o.   MRN: [id number]  HPI   Pain Inventory Average Pain 0 Pain Right Now 0 My pain is no pain  In the last 24 hours, has pain interfered with the following? General activity 0 Relation with others 0 Enjoyment of life 0 What TIME of day is your pain at its worst? night Sleep (in general) Good  Pain is worse with: unsure Pain improves with: rest Relief from Meds: 0  Mobility walk without assistance use a cane how many minutes can you walk? 15 ability to climb steps?  no do you drive?  no  Function disabled: date disabled 02/01/13  Neuro/Psych trouble walking  Prior Studies Any changes since last visit?  no  Physicians involved in your care Any changes since last visit?  no   Family History  Problem Relation Age of Onset  . COPD Mother   . COPD Father   . Cancer Sister   . Hyperlipidemia Brother   . Diabetes Brother    History   Social History  . Marital Status: Single    Spouse Name: N/A    Number of Children: N/A  . Years of Education: N/A   Social History Main Topics  . Smoking status: Never Smoker   . Smokeless tobacco: None  . Alcohol Use: No  . Drug Use: No  . Sexual Activity: None   Other Topics Concern  . None   Social History Narrative  . None   History reviewed. No pertinent past surgical history. Past Medical History  Diagnosis Date  . Gout   . Hypertension   . Stroke    There were no vitals taken for this visit.  Opioid Risk Score:   Fall Risk Score: Moderate Fall Risk (6-13 points) (educated and handout on fall prevention in the home was given at previous appt) Review of Systems  Musculoskeletal: Positive for gait problem.  All other systems reviewed and are negative.      Objective:   Physical Exam        Assessment & Plan:

## 2013-07-05 NOTE — Patient Instructions (Signed)
Recommend followup with neurologist to go over stroke prevention

## 2013-07-06 ENCOUNTER — Inpatient Hospital Stay (HOSPITAL_COMMUNITY): Admission: RE | Admit: 2013-07-06 | Payer: BC Managed Care – PPO | Source: Ambulatory Visit

## 2013-07-08 ENCOUNTER — Ambulatory Visit: Payer: BC Managed Care – PPO | Admitting: General Practice

## 2013-07-14 ENCOUNTER — Ambulatory Visit: Payer: BC Managed Care – PPO | Admitting: Neurology

## 2013-07-28 ENCOUNTER — Telehealth (HOSPITAL_COMMUNITY): Payer: Self-pay

## 2013-07-28 NOTE — Telephone Encounter (Signed)
  Date: 07/28/13 Left message regarding no attendance in therapy since April. Patient is to return call to make more appts or to be discharged.  Limmie PatriciaLaura Azie Mcconahy, OTR/L,CBIS

## 2013-08-04 ENCOUNTER — Telehealth (HOSPITAL_COMMUNITY): Payer: Self-pay

## 2013-08-04 NOTE — Telephone Encounter (Signed)
Date: 08/04/13  2nd attempt to reach patient regarding lack of therapy attendance. Informed patient that he will be discharged from therapy. In the future, if he requires therapy he will need a new signed order from his MD.  Limmie Patricia, OTR/L,CBIS

## 2013-08-04 NOTE — Addendum Note (Signed)
Encounter addended by: Bella Kennedy, PT on: 08/04/2013  1:08 PM<BR>     Documentation filed: Notes Section, Episodes

## 2013-08-04 NOTE — Addendum Note (Signed)
Encounter addended by: Jeanene Erb, OT on: 08/04/2013 10:48 AM<BR>     Documentation filed: Letters, Notes Section, Episodes

## 2013-08-04 NOTE — Progress Notes (Signed)
Discharge Note   Patient Details  Name: [name] MRN: [id number] Date of Birth: 1960-09-15  Today's Date: 08/04/2013 The above patient was discharged from OT on 08/04/2013 secondary to failure to return to clinic since 06/17/13.  Limmie Patricia, OTR/L,CBIS   08/04/2013, 10:43 AM

## 2013-08-04 NOTE — Progress Notes (Signed)
Pt has met insurance limitation. We had requested more visits but we have not heard from the insurance company.  Pt will be discharged from therapy.

## 2013-08-09 ENCOUNTER — Telehealth: Payer: Self-pay | Admitting: General Practice

## 2013-08-09 DIAGNOSIS — F329 Major depressive disorder, single episode, unspecified: Secondary | ICD-10-CM

## 2013-08-09 DIAGNOSIS — I10 Essential (primary) hypertension: Secondary | ICD-10-CM

## 2013-08-09 DIAGNOSIS — I2510 Atherosclerotic heart disease of native coronary artery without angina pectoris: Secondary | ICD-10-CM

## 2013-08-09 DIAGNOSIS — E785 Hyperlipidemia, unspecified: Secondary | ICD-10-CM

## 2013-08-09 DIAGNOSIS — F32A Depression, unspecified: Secondary | ICD-10-CM

## 2013-08-11 MED ORDER — SERTRALINE HCL 50 MG PO TABS: 50.0000 mg | ORAL_TABLET | Freq: Every day | ORAL | Status: DC

## 2013-08-11 MED ORDER — ATORVASTATIN CALCIUM 80 MG PO TABS: 80.0000 mg | ORAL_TABLET | Freq: Every day | ORAL | Status: DC

## 2013-08-11 MED ORDER — CLOPIDOGREL BISULFATE 75 MG PO TABS: 75.0000 mg | ORAL_TABLET | Freq: Every day | ORAL | Status: DC

## 2013-08-11 MED ORDER — CARVEDILOL 6.25 MG PO TABS: 6.2500 mg | ORAL_TABLET | Freq: Two times a day (BID) | ORAL | Status: DC

## 2013-08-11 MED ORDER — AMLODIPINE BESYLATE 10 MG PO TABS: 10.0000 mg | ORAL_TABLET | Freq: Every day | ORAL | Status: DC

## 2013-08-11 NOTE — Telephone Encounter (Signed)
done

## 2013-08-25 ENCOUNTER — Other Ambulatory Visit: Payer: Self-pay | Admitting: General Practice

## 2013-08-29 NOTE — Telephone Encounter (Signed)
Only seen Bruce Guerrero, s/p stroke

## 2013-08-29 NOTE — Telephone Encounter (Signed)
Do not do refills on prednisone without being seen

## 2013-09-26 ENCOUNTER — Telehealth: Payer: Self-pay | Admitting: Family Medicine

## 2013-09-26 MED ORDER — COLCHICINE 0.6 MG PO TABS: 0.6000 mg | ORAL_TABLET | Freq: Every day | ORAL | Status: DC

## 2013-09-26 NOTE — Telephone Encounter (Signed)
done

## 2013-10-04 ENCOUNTER — Encounter: Payer: BC Managed Care – PPO | Attending: Physical Medicine & Rehabilitation

## 2013-10-04 ENCOUNTER — Ambulatory Visit: Payer: BC Managed Care – PPO | Admitting: Physical Medicine & Rehabilitation

## 2013-10-10 ENCOUNTER — Ambulatory Visit (INDEPENDENT_AMBULATORY_CARE_PROVIDER_SITE_OTHER): Payer: BC Managed Care – PPO | Admitting: Family

## 2013-10-10 ENCOUNTER — Ambulatory Visit (INDEPENDENT_AMBULATORY_CARE_PROVIDER_SITE_OTHER): Payer: BC Managed Care – PPO

## 2013-10-10 ENCOUNTER — Encounter (INDEPENDENT_AMBULATORY_CARE_PROVIDER_SITE_OTHER): Payer: Self-pay

## 2013-10-10 ENCOUNTER — Encounter: Payer: Self-pay | Admitting: Family

## 2013-10-10 VITALS — BP 130/82 | HR 88 | Temp 98.3°F | Ht 64.0 in | Wt 200.0 lb

## 2013-10-10 DIAGNOSIS — F32A Depression, unspecified: Secondary | ICD-10-CM

## 2013-10-10 DIAGNOSIS — M1A00X0 Idiopathic chronic gout, unspecified site, without tophus (tophi): Secondary | ICD-10-CM

## 2013-10-10 DIAGNOSIS — F329 Major depressive disorder, single episode, unspecified: Secondary | ICD-10-CM

## 2013-10-10 DIAGNOSIS — Z125 Encounter for screening for malignant neoplasm of prostate: Secondary | ICD-10-CM

## 2013-10-10 DIAGNOSIS — S4291XA Fracture of right shoulder girdle, part unspecified, initial encounter for closed fracture: Secondary | ICD-10-CM

## 2013-10-10 DIAGNOSIS — F3289 Other specified depressive episodes: Secondary | ICD-10-CM

## 2013-10-10 DIAGNOSIS — I2510 Atherosclerotic heart disease of native coronary artery without angina pectoris: Secondary | ICD-10-CM

## 2013-10-10 DIAGNOSIS — S46909A Unspecified injury of unspecified muscle, fascia and tendon at shoulder and upper arm level, unspecified arm, initial encounter: Secondary | ICD-10-CM

## 2013-10-10 DIAGNOSIS — S4980XA Other specified injuries of shoulder and upper arm, unspecified arm, initial encounter: Secondary | ICD-10-CM

## 2013-10-10 DIAGNOSIS — Z1321 Encounter for screening for nutritional disorder: Secondary | ICD-10-CM

## 2013-10-10 DIAGNOSIS — S4991XA Unspecified injury of right shoulder and upper arm, initial encounter: Secondary | ICD-10-CM

## 2013-10-10 DIAGNOSIS — E785 Hyperlipidemia, unspecified: Secondary | ICD-10-CM | POA: Insufficient documentation

## 2013-10-10 DIAGNOSIS — M1A9XX0 Chronic gout, unspecified, without tophus (tophi): Secondary | ICD-10-CM

## 2013-10-10 DIAGNOSIS — S42209A Unspecified fracture of upper end of unspecified humerus, initial encounter for closed fracture: Secondary | ICD-10-CM

## 2013-10-10 DIAGNOSIS — Z23 Encounter for immunization: Secondary | ICD-10-CM

## 2013-10-10 DIAGNOSIS — I10 Essential (primary) hypertension: Secondary | ICD-10-CM

## 2013-10-10 MED ORDER — SERTRALINE HCL 50 MG PO TABS: 50.0000 mg | ORAL_TABLET | Freq: Every day | ORAL | Status: DC

## 2013-10-10 MED ORDER — CLOPIDOGREL BISULFATE 75 MG PO TABS: 75.0000 mg | ORAL_TABLET | Freq: Every day | ORAL | Status: DC

## 2013-10-10 MED ORDER — ATORVASTATIN CALCIUM 80 MG PO TABS: 80.0000 mg | ORAL_TABLET | Freq: Every day | ORAL | Status: DC

## 2013-10-10 MED ORDER — COLCHICINE 0.6 MG PO TABS: 0.6000 mg | ORAL_TABLET | Freq: Every day | ORAL | Status: DC

## 2013-10-10 MED ORDER — AMLODIPINE BESYLATE 10 MG PO TABS: 10.0000 mg | ORAL_TABLET | Freq: Every day | ORAL | Status: DC

## 2013-10-10 MED ORDER — CARVEDILOL 6.25 MG PO TABS: 6.2500 mg | ORAL_TABLET | Freq: Two times a day (BID) | ORAL | Status: DC

## 2013-10-10 NOTE — Patient Instructions (Addendum)
Health Maintenance A healthy lifestyle and preventative care can promote health and wellness.  Maintain regular health, dental, and eye exams.  Eat a healthy diet. Foods like vegetables, fruits, whole grains, low-fat dairy products, and lean protein foods contain the nutrients you need and are low in calories. Decrease your intake of foods high in solid fats, added sugars, and salt. Get information about a proper diet from your health care provider, if necessary.  Regular physical exercise is one of the most important things you can do for your health. Most adults should get at least 150 minutes of moderate-intensity exercise (any activity that increases your heart rate and causes you to sweat) each week. In addition, most adults need muscle-strengthening exercises on 2 or more days a week.   Maintain a healthy weight. The body mass index (BMI) is a screening tool to identify possible weight problems. It provides an estimate of body fat based on height and weight. Your health care provider can find your BMI and can help you achieve or maintain a healthy weight. For males 20 years and older:  A BMI below 18.5 is considered underweight.  A BMI of 18.5 to 24.9 is normal.  A BMI of 25 to 29.9 is considered overweight.  A BMI of 30 and above is considered obese.  Maintain normal blood lipids and cholesterol by exercising and minimizing your intake of saturated fat. Eat a balanced diet with plenty of fruits and vegetables. Blood tests for lipids and cholesterol should begin at age 20 and be repeated every 5 years. If your lipid or cholesterol levels are high, you are over age 50, or you are at high risk for heart disease, you may need your cholesterol levels checked more frequently.Ongoing high lipid and cholesterol levels should be treated with medicines if diet and exercise are not working.  If you smoke, find out from your health care provider how to quit. If you do not use tobacco, do not  start.  Lung cancer screening is recommended for adults aged 55-80 years who are at high risk for developing lung cancer because of a history of smoking. A yearly low-dose CT scan of the lungs is recommended for people who have at least a 30-pack-year history of smoking and are current smokers or have quit within the past 15 years. A pack year of smoking is smoking an average of 1 pack of cigarettes a day for 1 year (for example, a 30-pack-year history of smoking could mean smoking 1 pack a day for 30 years or 2 packs a day for 15 years). Yearly screening should continue until the smoker has stopped smoking for at least 15 years. Yearly screening should be stopped for people who develop a health problem that would prevent them from having lung cancer treatment.  If you choose to drink alcohol, do not have more than 2 drinks per day. One drink is considered to be 12 oz (360 mL) of beer, 5 oz (150 mL) of wine, or 1.5 oz (45 mL) of liquor.  Avoid the use of street drugs. Do not share needles with anyone. Ask for help if you need support or instructions about stopping the use of drugs.  High blood pressure causes heart disease and increases the risk of stroke. Blood pressure should be checked at least every 1-2 years. Ongoing high blood pressure should be treated with medicines if weight loss and exercise are not effective.  If you are 45-79 years old, ask your health care provider if   you should take aspirin to prevent heart disease.  Diabetes screening involves taking a blood sample to check your fasting blood sugar level. This should be done once every 3 years after age 45 if you are at a normal weight and without risk factors for diabetes. Testing should be considered at a younger age or be carried out more frequently if you are overweight and have at least 1 risk factor for diabetes.  Colorectal cancer can be detected and often prevented. Most routine colorectal cancer screening begins at the age of 50  and continues through age 75. However, your health care provider may recommend screening at an earlier age if you have risk factors for colon cancer. On a yearly basis, your health care provider may provide home test kits to check for hidden blood in the stool. A small camera at the end of a tube may be used to directly examine the colon (sigmoidoscopy or colonoscopy) to detect the earliest forms of colorectal cancer. Talk to your health care provider about this at age 50 when routine screening begins. A direct exam of the colon should be repeated every 5-10 years through age 75, unless early forms of precancerous polyps or small growths are found.  People who are at an increased risk for hepatitis B should be screened for this virus. You are considered at high risk for hepatitis B if:  You were born in a country where hepatitis B occurs often. Talk with your health care provider about which countries are considered high risk.  Your parents were born in a high-risk country and you have not received a shot to protect against hepatitis B (hepatitis B vaccine).  You have HIV or AIDS.  You use needles to inject street drugs.  You live with, or have sex with, someone who has hepatitis B.  You are a man who has sex with other men (MSM).  You get hemodialysis treatment.  You take certain medicines for conditions like cancer, organ transplantation, and autoimmune conditions.  Hepatitis C blood testing is recommended for all people born from 1945 through 1965 and any individual with known risk factors for hepatitis C.  Healthy men should no longer receive prostate-specific antigen (PSA) blood tests as part of routine cancer screening. Talk to your health care provider about prostate cancer screening.  Testicular cancer screening is not recommended for adolescents or adult males who have no symptoms. Screening includes self-exam, a health care provider exam, and other screening tests. Consult with your  health care provider about any symptoms you have or any concerns you have about testicular cancer.  Practice safe sex. Use condoms and avoid high-risk sexual practices to reduce the spread of sexually transmitted infections (STIs).  You should be screened for STIs, including gonorrhea and chlamydia if:  You are sexually active and are younger than 24 years.  You are older than 24 years, and your health care provider tells you that you are at risk for this type of infection.  Your sexual activity has changed since you were last screened, and you are at an increased risk for chlamydia or gonorrhea. Ask your health care provider if you are at risk.  If you are at risk of being infected with HIV, it is recommended that you take a prescription medicine daily to prevent HIV infection. This is called pre-exposure prophylaxis (PrEP). You are considered at risk if:  You are a man who has sex with other men (MSM).  You are a heterosexual man who   is sexually active with multiple partners.  You take drugs by injection.  You are sexually active with a partner who has HIV.  Talk with your health care provider about whether you are at high risk of being infected with HIV. If you choose to begin PrEP, you should first be tested for HIV. You should then be tested every 3 months for as long as you are taking PrEP.  Use sunscreen. Apply sunscreen liberally and repeatedly throughout the day. You should seek shade when your shadow is shorter than you. Protect yourself by wearing long sleeves, pants, a wide-brimmed hat, and sunglasses year round whenever you are outdoors.  Tell your health care provider of new moles or changes in moles, especially if there is a change in shape or color. Also, tell your health care provider if a mole is larger than the size of a pencil eraser.  A one-time screening for abdominal aortic aneurysm (AAA) and surgical repair of large AAAs by ultrasound is recommended for men aged  65-75 years who are current or former smokers. Shoulder Fracture You have a fractured humerus (bone in the upper arm) at the shoulder just below the ball of the shoulder joint. Most of the time the bones of a broken shoulder are in an acceptable position. Usually the injury can be treated with a shoulder immobilizer or sling and swath bandage. These devices support the arm and prevent any shoulder movement. If the bones are not in a good position, then surgery is sometimes needed. Shoulder fractures usually cause swelling, pain, and discoloration around the upper arm initially. They heal in 8-12 weeks with proper treatment. Rest in bed or a reclining chair as long as your shoulder is very painful. Sitting up generally results in less pain at the fracture site. Do not remove your shoulder bandage until your caregiver approves. You may apply ice packs over the shoulder for 20-30 minutes every 2 hours for the next 2-3 days to reduce the pain and swelling. Use your pain medicine as prescribed.  SEEK IMMEDIATE MEDICAL CARE IF: You develop severe shoulder pain unrelieved by rest and taking pain medicine. You have pain, numbness, tingling, or weakness in the hand or wrist. You develop shortness of breath, chest pain, severe weakness, or fainting. You have severe pain with motion of the fingers or wrist. MAKE SURE YOU:  Understand these instructions. Will watch your condition. Will get help right away if you are not doing well or get worse. Document Released: 04/03/2004 Document Revised: 05/19/2011 Document Reviewed: 06/14/2008 [facility] Patient Information 2015 [place], [place]. This information is not intended to replace advice given to you by your health care provider. Make sure you discuss any questions you have with your health care provider.  Stay current with your vaccines (immunizations). Document Released: 08/23/2007 Document Revised: 03/01/2013 Document Reviewed: 07/22/2010 [facility]: [facility] WExitCare Patient  Information 2015 [place], [place]. This information is not intended to replace advice given to you by your health care provider. Make sure you discuss any questions you have with your health care provider.

## 2013-10-10 NOTE — Progress Notes (Signed)
Subjective:    Patient ID: Bruce Guerrero, male    DOB: Jul 26, 1960, 53 y.o.   MRN: [id number]  Hypertension This is a chronic problem. The current episode started more than 1 year ago. The problem has been resolved since onset. The problem is controlled. Pertinent negatives include no anxiety, headaches, palpitations, peripheral edema or shortness of breath. Risk factors for coronary artery disease include dyslipidemia, male gender and obesity. Past treatments include beta blockers and calcium channel blockers. The current treatment provides moderate improvement. There is no history of kidney disease, CAD/MI, heart failure or a thyroid problem. There is no history of sleep apnea.  Arm Pain  The incident occurred more than 1 week ago ("Pt fell two weeks ago"). The incident occurred in the yard. The injury mechanism was a fall. The pain is present in the right shoulder. The quality of the pain is described as aching. The pain is at a severity of 5/10. The pain is moderate. The pain has been fluctuating since the incident. Associated symptoms include muscle weakness. Pertinent negatives include no numbness or tingling. He has tried rest, acetaminophen and NSAIDs for the symptoms. The treatment provided mild relief.  Hyperlipidemia This is a chronic problem. The current episode started more than 1 year ago. The problem is controlled. Recent lipid tests were reviewed and are normal. He has no history of diabetes or hypothyroidism. Factors aggravating his hyperlipidemia include fatty foods. Pertinent negatives include no leg pain, myalgias or shortness of breath. Current antihyperlipidemic treatment includes statins. The current treatment provides moderate improvement of lipids. Risk factors for coronary artery disease include dyslipidemia, hypertension, male sex and a sedentary lifestyle.  Gout Pt currently taking colchicine daily. Pt states he had a "mild" gout attack about 3 months ago, but last "real  gout attack" was last fall. Pt reports doing well with no complaints. Depression Pt currently taking zoloft 50 mg. Pt states this is helping and working fine. No complaints at this time.   *Pt had CVA 01/2012.  Pt has right sided weakness.   Review of Systems  Constitutional: Negative.   HENT: Negative.   Respiratory: Negative.  Negative for shortness of breath.   Cardiovascular: Negative.  Negative for palpitations.  Gastrointestinal: Negative.   Endocrine: Negative.   Genitourinary: Negative.   Musculoskeletal: Negative.  Negative for myalgias.  Neurological: Negative.  Negative for tingling, numbness and headaches.  Hematological: Negative.   Psychiatric/Behavioral: Negative.   All other systems reviewed and are negative.      Objective:   Physical Exam  Vitals reviewed. Constitutional: He is oriented to person, place, and time. He appears well-developed and well-nourished. No distress.  HENT:  Head: Normocephalic.  Right Ear: External ear normal.  Left Ear: External ear normal.  Nose: Nose normal.  Mouth/Throat: Oropharynx is clear and moist.  Eyes: Pupils are equal, round, and reactive to light. Right eye exhibits no discharge. Left eye exhibits no discharge.  Neck: Normal range of motion. Neck supple. No thyromegaly present.  Cardiovascular: Normal rate, regular rhythm, normal heart sounds and intact distal pulses.   No murmur heard. Pulmonary/Chest: Effort normal and breath sounds normal. No respiratory distress. He has no wheezes.  Abdominal: Soft. Bowel sounds are normal. He exhibits no distension. There is no tenderness.  Musculoskeletal: He exhibits edema (2+ edema in right hand) and tenderness (right shoulderr).  Right sided weakness from previous CVA Unable to raise right shoulder or bear weight   Neurological: He is alert and oriented to  person, place, and time. He has normal reflexes. No cranial nerve deficit.  Skin: Skin is warm and dry. No rash noted. No  erythema.  Large amt of Ecchymomas on right shoulder and arm   Psychiatric: He has a normal mood and affect. His behavior is normal. Judgment and thought content normal.      BP 130/82  Pulse 88  Temp(Src) 98.3 F (36.8 C) (Oral)  Ht $R'5\' 4"'fo$  (1.626 m)  Wt 200 lb (90.719 kg)  BMI 34.31 kg/m2     Assessment & Plan:  1. Essential hypertension - amLODipine (NORVASC) 10 MG tablet; Take 1 tablet (10 mg total) by mouth daily.  Dispense: 90 tablet; Refill: 3 - carvedilol (COREG) 6.25 MG tablet; Take 1 tablet (6.25 mg total) by mouth 2 (two) times daily with a meal.  Dispense: 180 tablet; Refill: 2 - CMP14+EGFR  2. Chronic gout without tophus, unspecified cause, unspecified site - colchicine 0.6 MG tablet; Take 1 tablet (0.6 mg total) by mouth daily.  Dispense: 90 tablet; Refill: 2  3. Depression - sertraline (ZOLOFT) 50 MG tablet; Take 1 tablet (50 mg total) by mouth daily.  Dispense: 90 tablet; Refill: 2  4. Hyperlipidemia - Lipid panel  5. Coronary artery disease involving native coronary artery of native heart without angina pectoris - clopidogrel (PLAVIX) 75 MG tablet; Take 1 tablet (75 mg total) by mouth daily with breakfast.  Dispense: 90 tablet; Refill: 2  6. HLD (hyperlipidemia) - atorvastatin (LIPITOR) 80 MG tablet; Take 1 tablet (80 mg total) by mouth daily at 6 PM.  Dispense: 3090 tablet; Refill: 3  7. Prostate cancer screening - PSA, total and free  8. Encounter for vitamin deficiency screening - Vit D  25 hydroxy (rtn osteoporosis monitoring)  9. Right shoulder injury, initial encounter  - DG Shoulder Right; Future  10. Need for diphtheria-tetanus-pertussis (Tdap) vaccine, adult/adolescent - Tdap vaccine greater than or equal to 7yo IM  11. Fracture of right shoulder, closed, initial encounter - Ambulatory referral to Orthopedic Surgery-STAT   Continue all meds Labs pending Health Maintenance reviewed-TDAP given today hemoccult cards given to patient with  directions Diet and exercise encouraged RTO 6 months  Evelina Dun, FNP

## 2013-10-11 ENCOUNTER — Telehealth: Payer: Self-pay | Admitting: Family

## 2013-10-11 ENCOUNTER — Other Ambulatory Visit: Payer: Self-pay | Admitting: Family

## 2013-10-11 LAB — PSA, TOTAL AND FREE
PSA FREE: 0.21 ng/mL
PSA, Free Pct: 42 %
PSA: 0.5 ng/mL (ref 0.0–4.0)

## 2013-10-11 LAB — CMP14+EGFR
ALK PHOS: 105 IU/L (ref 39–117)
ALT: 20 IU/L (ref 0–44)
AST: 19 IU/L (ref 0–40)
Albumin/Globulin Ratio: 2 (ref 1.1–2.5)
Albumin: 4.4 g/dL (ref 3.5–5.5)
BILIRUBIN TOTAL: 0.9 mg/dL (ref 0.0–1.2)
BUN / CREAT RATIO: 14 (ref 9–20)
BUN: 16 mg/dL (ref 6–24)
CO2: 27 mmol/L (ref 18–29)
CREATININE: 1.11 mg/dL (ref 0.76–1.27)
Calcium: 9.8 mg/dL (ref 8.7–10.2)
Chloride: 100 mmol/L (ref 97–108)
GFR calc non Af Amer: 76 mL/min/{1.73_m2} (ref >59)
GFR, EST AFRICAN AMERICAN: 88 mL/min/{1.73_m2} (ref >59)
GLOBULIN, TOTAL: 2.2 g/dL (ref 1.5–4.5)
Glucose: 68 mg/dL (ref 65–99)
Potassium: 4.6 mmol/L (ref 3.5–5.2)
SODIUM: 141 mmol/L (ref 134–144)
Total Protein: 6.6 g/dL (ref 6.0–8.5)

## 2013-10-11 LAB — LIPID PANEL
CHOL/HDL RATIO: 4.8 ratio (ref 0.0–5.0)
Cholesterol, Total: 116 mg/dL (ref 100–199)
HDL: 24 mg/dL — AB (ref >39)
LDL CALC: 19 mg/dL (ref 0–99)
Triglycerides: 364 mg/dL — ABNORMAL HIGH (ref 0–149)
VLDL CHOLESTEROL CAL: 73 mg/dL — AB (ref 5–40)

## 2013-10-11 LAB — VITAMIN D 25 HYDROXY (VIT D DEFICIENCY, FRACTURES): Vit D, 25-Hydroxy: 14.7 ng/mL — ABNORMAL LOW (ref 30.0–100.0)

## 2013-10-11 MED ORDER — FENOFIBRATE 145 MG PO TABS: 145.0000 mg | ORAL_TABLET | Freq: Every day | ORAL | Status: DC

## 2013-10-11 MED ORDER — METHYLPREDNISOLONE (PAK) 4 MG PO TABS: ORAL_TABLET | ORAL | Status: DC

## 2013-10-11 MED ORDER — VITAMIN D (ERGOCALCIFEROL) 1.25 MG (50000 UNIT) PO CAPS: 50000.0000 [IU] | ORAL_CAPSULE | ORAL | Status: DC

## 2013-10-11 NOTE — Telephone Encounter (Signed)
Rx sent for gout. Tell pt he can take two of his colchicine today (1.2 mg) and after 1 hour take another 0.6mg  for a maximum of 1.8 mg total dose in 24 hours.

## 2013-10-11 NOTE — Telephone Encounter (Signed)
Patient aware.

## 2013-10-13 ENCOUNTER — Other Ambulatory Visit (HOSPITAL_COMMUNITY): Payer: Self-pay | Admitting: Orthopedic Surgery

## 2013-10-13 DIAGNOSIS — M25511 Pain in right shoulder: Secondary | ICD-10-CM

## 2013-10-18 ENCOUNTER — Ambulatory Visit (HOSPITAL_COMMUNITY)
Admission: RE | Admit: 2013-10-18 | Discharge: 2013-10-18 | Disposition: A | Discharge status: Home / Self Care | Payer: BC Managed Care – PPO | Source: Ambulatory Visit | Attending: Orthopedic Surgery | Admitting: Orthopedic Surgery

## 2013-10-18 DIAGNOSIS — S42293A Other displaced fracture of upper end of unspecified humerus, initial encounter for closed fracture: Secondary | ICD-10-CM | POA: Insufficient documentation

## 2013-10-18 DIAGNOSIS — M25511 Pain in right shoulder: Secondary | ICD-10-CM

## 2013-10-18 DIAGNOSIS — X58XXXA Exposure to other specified factors, initial encounter: Secondary | ICD-10-CM | POA: Insufficient documentation

## 2013-11-11 ENCOUNTER — Ambulatory Visit (HOSPITAL_BASED_OUTPATIENT_CLINIC_OR_DEPARTMENT_OTHER): Payer: BC Managed Care – PPO | Admitting: Physical Medicine & Rehabilitation

## 2013-11-11 ENCOUNTER — Encounter: Payer: BC Managed Care – PPO | Attending: Physical Medicine & Rehabilitation

## 2013-11-11 ENCOUNTER — Encounter: Payer: Self-pay | Admitting: Physical Medicine & Rehabilitation

## 2013-11-11 VITALS — BP 153/77 | HR 65 | Resp 14 | Ht 64.0 in | Wt 202.0 lb

## 2013-11-11 DIAGNOSIS — G8110 Spastic hemiplegia affecting unspecified side: Secondary | ICD-10-CM

## 2013-11-11 DIAGNOSIS — I69959 Hemiplegia and hemiparesis following unspecified cerebrovascular disease affecting unspecified side: Secondary | ICD-10-CM | POA: Diagnosis not present

## 2013-11-11 DIAGNOSIS — I10 Essential (primary) hypertension: Secondary | ICD-10-CM | POA: Insufficient documentation

## 2013-11-11 DIAGNOSIS — S42309D Unspecified fracture of shaft of humerus, unspecified arm, subsequent encounter for fracture with routine healing: Secondary | ICD-10-CM | POA: Insufficient documentation

## 2013-11-11 NOTE — Progress Notes (Signed)
Subjective:    Patient ID: Bruce Guerrero, male    DOB: 10-21-1960, 53 y.o.   MRN: [id number]  HPI 53 year old right-handed male with history of hypertension,  independent prior to admission, working full-time. Admitted on February 01, 2013, with right-sided weakness and slurred speech. MRI of the  brain showed a moderately enlarged deep white matter infarction on the  left. MRA of the head with no large vessel occlusion identified.  Carotid Dopplers with no ICA stenosis. Echocardiogram with ejection  fraction of 60% and grade 1 diastolic dysfunction. The patient did not  receive tPA. Neurology Service was consulted, maintained on Plavix,  aspirin therapy, and later just Plavix alone  DATE OF ADMISSION: 02/08/2013  DATE OF DISCHARGE: 02/26/2013  Fell after attacked by yellow jackets Non displaced humeral fracture Evaluated by orthopedics nonsurgical treatment. Followup September 15.  Shoulder feels like it is balling up at times. Feels tighter Pain Inventory Average Pain 1 Pain Right Now 1 My pain is dull  In the last 24 hours, has pain interfered with the following? General activity 4 Relation with others 4 Enjoyment of life 2 What TIME of day is your pain at its worst? night Sleep (in general) Good  Pain is worse with: some activites Pain improves with: rest and medication Relief from Meds: 10  Mobility walk without assistance use a cane how many minutes can you walk? 30 ability to climb steps?  yes do you drive?  yes  Function disabled: date disabled 2014  Neuro/Psych No problems in this area  Prior Studies Any changes since last visit?  yes  Physicians involved in your care Orthopedist Beverely Low   Family History  Problem Relation Age of Onset  . COPD Mother   . COPD Father   . Cancer Sister   . Hyperlipidemia Brother   . Diabetes Brother    History   Social History  . Marital Status: Single    Spouse Name: N/A    Number of Children:  N/A  . Years of Education: N/A   Social History Main Topics  . Smoking status: Never Smoker   . Smokeless tobacco: None  . Alcohol Use: No  . Drug Use: No  . Sexual Activity: None   Other Topics Concern  . None   Social History Narrative  . None   History reviewed. No pertinent past surgical history. Past Medical History  Diagnosis Date  . Gout   . Hypertension   . Stroke    BP 153/77  Pulse 65  Resp 14  Ht  (1.626 m)  Wt 202 lb (91.627 kg)  BMI 34.66 kg/m2  SpO2 100%  Opioid Risk Score:   Fall Risk Score: Moderate Fall Risk (6-13 points) (previoulsy educated and given handout) Review of Systems  All other systems reviewed and are negative.      Objective:   Physical Exam  Right finger and wrist flexor modified Ashworth scale 3 spasticity Trace finger flexion otherwise 0/5 in the hand and wrist 2 minus biceps otherwise 0 in the arm Right lower extremity 4 minus in the hip flexor knee extensor 2 minus in ankle dorsiflexor plantar flexor Increased extensor tone in the right lower extremity Left side has normal strength Ambulates with mild toe drag uses hip hiking to clear the right lower extremity uses straight cane. Increased base of support.      Assessment & Plan:  1. Deep white matter infarct onset 02/01/2013. He has chronic right hemiplegia. He  has spasticity but that causes right hand fisting. does not want to pursue neurolytic injections at this time  In the interval he is sustained a humeral fracture and is being treated by orthopedics. He will follow them. Orthopedics will also order OT if necessary post fracture healing to increase range of motion  Physical medicine and rehabilitation followup on an as-needed basis

## 2013-11-11 NOTE — Patient Instructions (Signed)
Call if increasing spasm in R hand

## 2014-01-11 ENCOUNTER — Encounter: Payer: Self-pay | Admitting: Family Medicine

## 2014-01-11 ENCOUNTER — Ambulatory Visit (INDEPENDENT_AMBULATORY_CARE_PROVIDER_SITE_OTHER): Payer: BC Managed Care – PPO | Admitting: Family Medicine

## 2014-01-11 ENCOUNTER — Telehealth: Payer: Self-pay | Admitting: Family Medicine

## 2014-01-11 VITALS — BP 125/72 | HR 61 | Temp 97.6°F | Ht 64.0 in | Wt 200.8 lb

## 2014-01-11 DIAGNOSIS — Z23 Encounter for immunization: Secondary | ICD-10-CM

## 2014-01-11 DIAGNOSIS — E785 Hyperlipidemia, unspecified: Secondary | ICD-10-CM

## 2014-01-11 DIAGNOSIS — I10 Essential (primary) hypertension: Secondary | ICD-10-CM

## 2014-01-11 DIAGNOSIS — Z0000 Encounter for general adult medical examination without abnormal findings: Secondary | ICD-10-CM

## 2014-01-11 MED ORDER — ATORVASTATIN CALCIUM 80 MG PO TABS: 80.0000 mg | ORAL_TABLET | Freq: Every day | ORAL | Status: DC

## 2014-01-11 MED ORDER — AMLODIPINE BESYLATE 10 MG PO TABS: 10.0000 mg | ORAL_TABLET | Freq: Every day | ORAL | Status: DC

## 2014-01-11 NOTE — Addendum Note (Signed)
Addended by: Almeta MonasSTONE, Tiki Tucciarone M on: 01/11/2014 10:02 AM   Modules accepted: Orders

## 2014-01-11 NOTE — Progress Notes (Signed)
   Subjective:    Patient ID: Bruce Guerrero, male    DOB: March 25, 1960, 53 y.o.   MRN: [id number]  HPI  53 year old gentleman who is here today to follow-up hypertension hyperlipidemia and old CVA. Several months ago he fell with a fracture of the right humerus. He still goes to a skilled nursing facility for rehabilitation. Generally he is doing well. In reviewing his medicines noted that he was started on TriCor at his last visit for an isolated elevated value of his triglycerides. We discussed this and although he may be of the phenotype of low HDL and high triglycerides this has not been manifested on previous testing. Toward that end, we will hold TriCor and repeat triglycerides at next visit.    Review of Systems  Constitutional: Negative.        Right-sided weakness requiring use of cane  HENT: Negative.   Eyes: Negative.   Respiratory: Negative.  Negative for shortness of breath.   Cardiovascular: Negative.  Negative for chest pain and leg swelling.  Gastrointestinal: Negative.   Genitourinary: Negative.   Musculoskeletal: Negative.   Skin: Negative.   Neurological: Negative.   Psychiatric/Behavioral: Negative.   All other systems reviewed and are negative.      Objective:   Physical Exam  Constitutional: He is oriented to person, place, and time. He appears well-developed and well-nourished.  HENT:  Head: Normocephalic.  Right Ear: External ear normal.  Left Ear: External ear normal.  Nose: Nose normal.  Mouth/Throat: Oropharynx is clear and moist.  Eyes: Conjunctivae and EOM are normal. Pupils are equal, round, and reactive to light.  Neck: Normal range of motion. Neck supple.  Cardiovascular: Normal rate, regular rhythm, normal heart sounds and intact distal pulses.   Pulmonary/Chest: Effort normal and breath sounds normal.  Abdominal: Soft. Bowel sounds are normal.  Musculoskeletal: Normal range of motion.  Neurological: He is alert and oriented to person, place,  and time.  Skin: Skin is warm and dry.  Psychiatric: He has a normal mood and affect. His behavior is normal. Judgment and thought content normal.    BP 125/72 mmHg  Pulse 61  Temp(Src) 97.6 F (36.4 C) (Oral)  Ht 5\' 4"  (1.626 m)  Wt 200 lb 12.8 oz (91.082 kg)  BMI 34.45 kg/m2      Assessment & Plan:  1. Routine check-up Has been on 50000 units since last visit - Vit D  25 hydroxy (rtn osteoporosis monitoring)  Frederica KusterStephen M Miller MD

## 2014-01-11 NOTE — Telephone Encounter (Signed)
Called [facility] and rx should be for #90 with 3 refills per Bruce Guerrero. corrected

## 2014-01-12 LAB — VITAMIN D 25 HYDROXY (VIT D DEFICIENCY, FRACTURES): Vit D, 25-Hydroxy: 31 ng/mL (ref 30.0–100.0)

## 2014-03-23 ENCOUNTER — Encounter: Payer: Self-pay | Admitting: *Deleted

## 2014-04-19 ENCOUNTER — Encounter: Payer: Self-pay | Admitting: Family Medicine

## 2014-04-19 ENCOUNTER — Ambulatory Visit (INDEPENDENT_AMBULATORY_CARE_PROVIDER_SITE_OTHER): Payer: 59 | Admitting: Family Medicine

## 2014-04-19 VITALS — BP 128/78 | HR 61 | Temp 97.0°F | Ht 65.0 in | Wt 199.4 lb

## 2014-04-19 DIAGNOSIS — M1A9XX0 Chronic gout, unspecified, without tophus (tophi): Secondary | ICD-10-CM

## 2014-04-19 DIAGNOSIS — E785 Hyperlipidemia, unspecified: Secondary | ICD-10-CM

## 2014-04-19 MED ORDER — COLCHICINE 0.6 MG PO TABS: 0.6000 mg | ORAL_TABLET | Freq: Every day | ORAL | Status: DC

## 2014-04-19 NOTE — Progress Notes (Signed)
   Subjective:    Patient ID: Bruce Guerrero, male    DOB: [birth year], 54 y.o.   MRN: [id number]  HPI 54 year old gentleman here to follow-up lipid problem. He has a history of cerebral infarction and right sided weakness hypertension, and gout. At his last visit we were not sure why he was started on TriCor for isolated elevation of triglycerides when it had been normal previously. We stopped the TriCor and the plan is to check triglycerides today to see exactly where it is and if the need still exists for that med.  Patient Active Problem List   Diagnosis Date Noted  . Depression 10/10/2013  . Hyperlipidemia 10/10/2013  . Spastic hemiplegia affecting dominant side 05/23/2013  . Difficulty in walking(719.7) 03/08/2013  . Right leg weakness 03/08/2013  . Balance problems 03/08/2013  . Weakness of right upper extremity 03/08/2013  . Knee pain, right anterior 02/15/2013  . CVA (cerebral infarction) 02/01/2013  . Gout 08/05/2012  . HTN (hypertension) 08/05/2012   Outpatient Encounter Prescriptions as of 04/19/2014  Medication Sig  . amLODipine (NORVASC) 10 MG tablet Take 1 tablet (10 mg total) by mouth daily.  Marland Kitchen. atorvastatin (LIPITOR) 80 MG tablet Take 1 tablet (80 mg total) by mouth daily at 6 PM.  . carvedilol (COREG) 6.25 MG tablet Take 1 tablet (6.25 mg total) by mouth 2 (two) times daily with a meal.  . clopidogrel (PLAVIX) 75 MG tablet Take 1 tablet (75 mg total) by mouth daily with breakfast.  . colchicine 0.6 MG tablet Take 1 tablet (0.6 mg total) by mouth daily.  . sertraline (ZOLOFT) 50 MG tablet Take 1 tablet (50 mg total) by mouth daily.  . fenofibrate (TRICOR) 145 MG tablet Take 1 tablet (145 mg total) by mouth daily. (Patient not taking: Reported on 04/19/2014)  . Vitamin D, Ergocalciferol, (DRISDOL) 50000 UNITS CAPS capsule Take 1 capsule (50,000 Units total) by mouth every 7 (seven) days. (Patient not taking: Reported on 04/19/2014)      Review of Systems  HENT:  Negative.   Respiratory: Negative.   Cardiovascular: Negative.   Gastrointestinal: Negative.   Genitourinary: Negative.   Neurological: Positive for weakness.  Psychiatric/Behavioral: Negative.        Objective:   Physical Exam  Constitutional: He is oriented to person, place, and time. He appears well-developed and well-nourished.  HENT:  Head: Normocephalic.  Eyes: Pupils are equal, round, and reactive to light.  Cardiovascular: Normal rate.   Pulmonary/Chest: Effort normal.  Abdominal: Soft. Bowel sounds are normal.  Neurological: He is alert and oriented to person, place, and time.   BP 128/78 mmHg  Pulse 61  Temp(Src) 97 F (36.1 C) (Oral)  Ht 5\' 5"  (1.651 m)  Wt 199 lb 6.4 oz (90.447 kg)  BMI 33.18 kg/m2        Assessment & Plan:  1. Hyperlipidemia Check triglycerides off fibrate - Lipid panel  2. Chronic gout without tophus, unspecified cause, unspecified site  - colchicine 0.6 MG tablet; Take 1 tablet (0.6 mg total) by mouth daily.  Dispense: 90 tablet; Refill: 2  Frederica KusterStephen M Miller MD

## 2014-04-20 LAB — LIPID PANEL
CHOL/HDL RATIO: 3 ratio (ref 0.0–5.0)
CHOLESTEROL TOTAL: 93 mg/dL — AB (ref 100–199)
HDL: 31 mg/dL — ABNORMAL LOW (ref >39)
LDL Calculated: 32 mg/dL (ref 0–99)
TRIGLYCERIDES: 150 mg/dL — AB (ref 0–149)
VLDL Cholesterol Cal: 30 mg/dL (ref 5–40)

## 2014-04-21 ENCOUNTER — Ambulatory Visit: Payer: BC Managed Care – PPO | Admitting: Family Medicine

## 2014-04-26 ENCOUNTER — Other Ambulatory Visit: Payer: Self-pay | Admitting: *Deleted

## 2014-04-26 ENCOUNTER — Telehealth: Payer: Self-pay | Admitting: Family Medicine

## 2014-04-26 MED ORDER — COLCHICINE 0.6 MG PO CAPS: 1.0000 | ORAL_CAPSULE | Freq: Every day | ORAL | Status: DC

## 2014-04-26 NOTE — Telephone Encounter (Signed)
Printed and handed this to BorgWarnerDonna Lawson

## 2014-05-11 ENCOUNTER — Telehealth: Payer: Self-pay | Admitting: Family Medicine

## 2014-05-11 NOTE — Telephone Encounter (Signed)
RX for prednisone called into [facility] notified

## 2014-05-11 NOTE — Telephone Encounter (Signed)
Please call prescription and for prednisone 10 mg #20 taper

## 2014-08-02 ENCOUNTER — Other Ambulatory Visit: Payer: Self-pay | Admitting: Family

## 2014-10-04 ENCOUNTER — Encounter: Payer: Self-pay | Admitting: Family Medicine

## 2014-10-04 ENCOUNTER — Ambulatory Visit (INDEPENDENT_AMBULATORY_CARE_PROVIDER_SITE_OTHER): Payer: 59 | Admitting: Family Medicine

## 2014-10-04 VITALS — BP 122/74 | HR 60 | Temp 97.6°F | Ht 65.0 in | Wt 199.0 lb

## 2014-10-04 DIAGNOSIS — J301 Allergic rhinitis due to pollen: Secondary | ICD-10-CM | POA: Diagnosis not present

## 2014-10-04 DIAGNOSIS — H8301 Labyrinthitis, right ear: Secondary | ICD-10-CM | POA: Diagnosis not present

## 2014-10-04 MED ORDER — FLUTICASONE PROPIONATE 50 MCG/ACT NA SUSP: NASAL | Status: DC

## 2014-10-04 MED ORDER — MECLIZINE HCL 12.5 MG PO TABS
ORAL_TABLET | ORAL | Status: DC
Start: 2014-10-04 — End: 2014-10-19

## 2014-10-04 NOTE — Progress Notes (Signed)
Subjective:    Patient ID: Bruce Guerrero, male    DOB: 03-Feb-1961, 54 y.o.   MRN: [id number]  HPI Patient here today for dizziness, headache and pressure that started about 2 days ago. The patient has a history of a CVA in the past with right upper extremity paralysis. He is alert and keeps his blood pressure under good control now. This dizziness has been going off and on for the past couple days. He has had increased congestion in his head but no drainage. He's had a couple spells of dizziness where it feels like the room is spinning around and maybe some mild nausea associated with this. He lays down and he feels better. He does have increased tinnitus in the right ear. He's had no changes in his vision and he is drinking plenty of fluids.     Patient Active Problem List   Diagnosis Date Noted  . Depression 10/10/2013  . Hyperlipidemia 10/10/2013  . Spastic hemiplegia affecting dominant side 05/23/2013  . Difficulty in walking(719.7) 03/08/2013  . Right leg weakness 03/08/2013  . Balance problems 03/08/2013  . Weakness of right upper extremity 03/08/2013  . Knee pain, right anterior 02/15/2013  . CVA (cerebral infarction) 02/01/2013  . Gout 08/05/2012  . HTN (hypertension) 08/05/2012   Outpatient Encounter Prescriptions as of 10/04/2014  Medication Sig  . amLODipine (NORVASC) 10 MG tablet Take 1 tablet (10 mg total) by mouth daily.  Marland Kitchen atorvastatin (LIPITOR) 80 MG tablet Take 1 tablet (80 mg total) by mouth daily at 6 PM.  . carvedilol (COREG) 6.25 MG tablet TAKE ONE TABLET BY MOUTH TWICE DAILY WITH MEALS  . clopidogrel (PLAVIX) 75 MG tablet TAKE ONE TABLET BY MOUTH ONCE DAILY WITH BREAKFAST  . Colchicine (MITIGARE) 0.6 MG CAPS Take 1 capsule by mouth daily.  . sertraline (ZOLOFT) 50 MG tablet TAKE ONE TABLET BY MOUTH ONCE DAILY  . [DISCONTINUED] fenofibrate (TRICOR) 145 MG tablet Take 1 tablet (145 mg total) by mouth daily. (Patient not taking: Reported on 04/19/2014)  .  [DISCONTINUED] Vitamin D, Ergocalciferol, (DRISDOL) 50000 UNITS CAPS capsule Take 1 capsule (50,000 Units total) by mouth every 7 (seven) days. (Patient not taking: Reported on 04/19/2014)   No facility-administered encounter medications on file as of 10/04/2014.     Review of Systems  Constitutional: Negative.   HENT: Positive for sinus pressure.   Eyes: Negative.   Respiratory: Negative.   Cardiovascular: Negative.   Gastrointestinal: Negative.   Endocrine: Negative.   Genitourinary: Negative.   Musculoskeletal: Negative.   Skin: Negative.   Allergic/Immunologic: Negative.   Neurological: Positive for dizziness and headaches.  Hematological: Negative.   Psychiatric/Behavioral: Negative.        Objective:   Physical Exam  Constitutional: He is oriented to person, place, and time. He appears well-developed and well-nourished. No distress.  HENT:  Head: Normocephalic and atraumatic.  Mouth/Throat: No oropharyngeal exudate.  Slight redness right posterior throat. Nasal congestion and turbinate swelling and pallor bilaterally. Ear cerumen bilaterally right greater than left.  Eyes: Conjunctivae and EOM are normal. Pupils are equal, round, and reactive to light. Right eye exhibits no discharge. Left eye exhibits no discharge. No scleral icterus.  Neck: Normal range of motion. Neck supple. No thyromegaly present.  No bruits thyromegaly or anterior cervical adenopathy  Cardiovascular: Normal rate, regular rhythm and normal heart sounds.  Exam reveals no friction rub.   No murmur heard. At 72/m  Pulmonary/Chest: Effort normal and breath sounds normal. No respiratory distress.  He has no wheezes. He has no rales. He exhibits no tenderness.  Clear anteriorly and posteriorly  Musculoskeletal: He exhibits no edema or tenderness.  Decrease use of the right upper extremity and slight diminished use of the right lower extremity secondary to CVA  Lymphadenopathy:    He has no cervical  adenopathy.  Neurological: He is alert and oriented to person, place, and time.  Skin: Skin is warm and dry. No rash noted.  Psychiatric: He has a normal mood and affect. His behavior is normal. Judgment and thought content normal.  Nursing note and vitals reviewed.  BP 122/74 mmHg  Pulse 60  Temp(Src) 97.6 F (36.4 C) (Oral)  Ht  (1.651 m)  Wt 199 lb (90.266 kg)  BMI 33.12 kg/m2        Assessment & Plan:  1. Labyrinthitis, right -Drink plenty of fluids. Take medication as directed. - meclizine (ANTIVERT) 12.5 MG tablet; Take 1 pill 3 times daily with food for at least 7 days and then as needed  Dispense: 30 tablet; Refill: 1  2. Allergic rhinitis due to pollen -Use prescription nose spray as well as nasal saline through the day. Prescription nose spray at bedtime. - fluticasone (FLONASE) 50 MCG/ACT nasal spray; One to 2 sprays each nostril at bedtime  Dispense: 16 g; Refill: 6  Patient Instructions  Take meclizine regularly for at least one week and then as needed Drink plenty of water and fluids and decrease caffeine intake Monitor blood pressures at home if possible. Omron is a good blood pressure monitor Use Flonase nasal spray regularly 1-2 sprays each nostril at bedtime Use nasal saline each nostril frequently through the day Use Debrox earwax softener for 2-3 nights in a row in each ear wait a week and repeat and come back to the clinic in a couple weeks for ear irrigation   Nyra Capes MD

## 2014-10-04 NOTE — Patient Instructions (Signed)
Take meclizine regularly for at least one week and then as needed Drink plenty of water and fluids and decrease caffeine intake Monitor blood pressures at home if possible. Omron is a good blood pressure monitor Use Flonase nasal spray regularly 1-2 sprays each nostril at bedtime Use nasal saline each nostril frequently through the day Use Debrox earwax softener for 2-3 nights in a row in each ear wait a week and repeat and come back to the clinic in a couple weeks for ear irrigation

## 2014-10-19 ENCOUNTER — Ambulatory Visit (INDEPENDENT_AMBULATORY_CARE_PROVIDER_SITE_OTHER): Payer: 59 | Admitting: Family Medicine

## 2014-10-19 ENCOUNTER — Encounter: Payer: Self-pay | Admitting: Family Medicine

## 2014-10-19 VITALS — BP 115/63 | HR 61 | Temp 98.2°F | Ht 65.0 in | Wt 201.0 lb

## 2014-10-19 DIAGNOSIS — E785 Hyperlipidemia, unspecified: Secondary | ICD-10-CM

## 2014-10-19 DIAGNOSIS — M1A9XX0 Chronic gout, unspecified, without tophus (tophi): Secondary | ICD-10-CM | POA: Diagnosis not present

## 2014-10-19 DIAGNOSIS — Z125 Encounter for screening for malignant neoplasm of prostate: Secondary | ICD-10-CM

## 2014-10-19 DIAGNOSIS — I10 Essential (primary) hypertension: Secondary | ICD-10-CM

## 2014-10-19 MED ORDER — ATORVASTATIN CALCIUM 80 MG PO TABS: 80.0000 mg | ORAL_TABLET | Freq: Every day | ORAL | Status: DC

## 2014-10-19 MED ORDER — COLCHICINE 0.6 MG PO CAPS: 1.0000 | ORAL_CAPSULE | Freq: Every day | ORAL | Status: DC

## 2014-10-19 MED ORDER — AMLODIPINE BESYLATE 10 MG PO TABS: 10.0000 mg | ORAL_TABLET | Freq: Every day | ORAL | Status: DC

## 2014-10-19 MED ORDER — CLOPIDOGREL BISULFATE 75 MG PO TABS: 75.0000 mg | ORAL_TABLET | Freq: Every day | ORAL | Status: DC

## 2014-10-19 MED ORDER — SERTRALINE HCL 50 MG PO TABS: 50.0000 mg | ORAL_TABLET | Freq: Every day | ORAL | Status: DC

## 2014-10-19 MED ORDER — CARVEDILOL 6.25 MG PO TABS: 6.2500 mg | ORAL_TABLET | Freq: Two times a day (BID) | ORAL | Status: DC

## 2014-10-19 NOTE — Progress Notes (Signed)
Subjective:    Patient ID: Bruce Guerrero, male    DOB: 06-Jan-1961, 54 y.o.   MRN: [id number]  HPI 54 year old gentleman here to follow-up hypertension lipids, and gout. Blood pressure is maintained with amlodipine and carvedilol. He tolerate statins well. Last gout flare was about 2 years ago and he is on daily doses of colchicine  Patient Active Problem List   Diagnosis Date Noted  . Depression 10/10/2013  . Hyperlipidemia 10/10/2013  . Spastic hemiplegia affecting dominant side 05/23/2013  . Difficulty in walking(719.7) 03/08/2013  . Right leg weakness 03/08/2013  . Balance problems 03/08/2013  . Weakness of right upper extremity 03/08/2013  . Knee pain, right anterior 02/15/2013  . CVA (cerebral infarction) 02/01/2013  . Gout 08/05/2012  . HTN (hypertension) 08/05/2012   Outpatient Encounter Prescriptions as of 10/19/2014  Medication Sig  . amLODipine (NORVASC) 10 MG tablet Take 1 tablet (10 mg total) by mouth daily.  Marland Kitchen atorvastatin (LIPITOR) 80 MG tablet Take 1 tablet (80 mg total) by mouth daily at 6 PM.  . carvedilol (COREG) 6.25 MG tablet TAKE ONE TABLET BY MOUTH TWICE DAILY WITH MEALS  . clopidogrel (PLAVIX) 75 MG tablet TAKE ONE TABLET BY MOUTH ONCE DAILY WITH BREAKFAST  . Colchicine (MITIGARE) 0.6 MG CAPS Take 1 capsule by mouth daily.  . sertraline (ZOLOFT) 50 MG tablet TAKE ONE TABLET BY MOUTH ONCE DAILY  . [DISCONTINUED] fluticasone (FLONASE) 50 MCG/ACT nasal spray One to 2 sprays each nostril at bedtime  . [DISCONTINUED] meclizine (ANTIVERT) 12.5 MG tablet Take 1 pill 3 times daily with food for at least 7 days and then as needed   No facility-administered encounter medications on file as of 10/19/2014.      Review of Systems  Constitutional: Negative.   Respiratory: Negative.   Cardiovascular: Negative.   Gastrointestinal: Negative.   Genitourinary: Negative.   Neurological: Negative.   Psychiatric/Behavioral: Negative.        Objective:   Physical  Exam  Constitutional: He is oriented to person, place, and time. He appears well-developed and well-nourished.  HENT:  Both ears with increased cerumen and TMs are not visualized. Both were successfully irrigated  Cardiovascular: Normal rate and regular rhythm.   Pulmonary/Chest: Effort normal and breath sounds normal.  Musculoskeletal:  She has some right-sided weakness secondary to old CVA  Neurological: He is alert and oriented to person, place, and time.  Psychiatric: He has a normal mood and affect. His behavior is normal.   BP 115/63 mmHg  Pulse 61  Temp(Src) 98.2 F (36.8 C) (Oral)  Ht $R'5\' 5"'wm$  (7.017 m)  Wt 201 lb (91.173 kg)  BMI 33.45 kg/m2        Assessment & Plan:  1. Essential hypertension Blood pressures are well controlled on current regimen. No changes recommended - CMP14+EGFR - amLODipine (NORVASC) 10 MG tablet; Take 1 tablet (10 mg total) by mouth daily.  Dispense: 90 tablet; Refill: 3  2. Hyperlipidemia Need to check lipids. Had taken him off of triglyceride medicines but need to follow-up - Lipid panel - CMP14+EGFR  3. Chronic gout without tophus, unspecified cause, unspecified site Discussed using allopurinol. He had been on that before but since he has not had a flareup of gout in 2 years will continue with just colchicine  4. Prostate cancer screening Has no symptoms suggestive of enlarged prostate - PSA, total and free  5. HLD (hyperlipidemia) Same as above - atorvastatin (LIPITOR) 80 MG tablet; Take 1 tablet (80 mg total)  by mouth daily at 6 PM.  Dispense: 90 tablet; Refill: 3  Wardell Honour MD

## 2014-10-20 LAB — CMP14+EGFR
A/G RATIO: 2.1 (ref 1.1–2.5)
ALT: 32 IU/L (ref 0–44)
AST: 18 IU/L (ref 0–40)
Albumin: 4.4 g/dL (ref 3.5–5.5)
Alkaline Phosphatase: 101 IU/L (ref 39–117)
BILIRUBIN TOTAL: 0.5 mg/dL (ref 0.0–1.2)
BUN/Creatinine Ratio: 11 (ref 9–20)
BUN: 12 mg/dL (ref 6–24)
CO2: 23 mmol/L (ref 18–29)
Calcium: 9.4 mg/dL (ref 8.7–10.2)
Chloride: 99 mmol/L (ref 97–108)
Creatinine, Ser: 1.08 mg/dL (ref 0.76–1.27)
GFR, EST AFRICAN AMERICAN: 90 mL/min/{1.73_m2} (ref >59)
GFR, EST NON AFRICAN AMERICAN: 78 mL/min/{1.73_m2} (ref >59)
Globulin, Total: 2.1 g/dL (ref 1.5–4.5)
Glucose: 116 mg/dL — ABNORMAL HIGH (ref 65–99)
POTASSIUM: 4.1 mmol/L (ref 3.5–5.2)
Sodium: 140 mmol/L (ref 134–144)
TOTAL PROTEIN: 6.5 g/dL (ref 6.0–8.5)

## 2014-10-20 LAB — PSA, TOTAL AND FREE
PSA FREE PCT: 57.5 %
PSA, Free: 0.23 ng/mL
Prostate Specific Ag, Serum: 0.4 ng/mL (ref 0.0–4.0)

## 2014-10-20 LAB — LIPID PANEL
CHOLESTEROL TOTAL: 95 mg/dL — AB (ref 100–199)
Chol/HDL Ratio: 3.7 ratio units (ref 0.0–5.0)
HDL: 26 mg/dL — AB (ref >39)
LDL Calculated: 36 mg/dL (ref 0–99)
TRIGLYCERIDES: 164 mg/dL — AB (ref 0–149)
VLDL CHOLESTEROL CAL: 33 mg/dL (ref 5–40)

## 2014-11-02 ENCOUNTER — Encounter: Payer: Self-pay | Admitting: Family Medicine

## 2014-11-02 ENCOUNTER — Ambulatory Visit (INDEPENDENT_AMBULATORY_CARE_PROVIDER_SITE_OTHER): Payer: 59 | Admitting: Family Medicine

## 2014-11-02 VITALS — BP 125/70 | HR 59 | Temp 97.2°F | Ht 65.0 in | Wt 200.8 lb

## 2014-11-02 DIAGNOSIS — L03312 Cellulitis of back [any part except buttock]: Secondary | ICD-10-CM

## 2014-11-02 DIAGNOSIS — L0390 Cellulitis, unspecified: Secondary | ICD-10-CM | POA: Insufficient documentation

## 2014-11-02 MED ORDER — DOXYCYCLINE HYCLATE 100 MG PO TABS: 100.0000 mg | ORAL_TABLET | Freq: Two times a day (BID) | ORAL | Status: DC

## 2014-11-02 NOTE — Patient Instructions (Signed)
Great to meet you!  I am treating you for cellulitis, a bacterial infection of the skin. There doesn't appear to be any abscess but if you develop a soft painful spot in that area please come back.   Cellulitis Cellulitis is an infection of the skin and the tissue beneath it. The infected area is usually red and tender. Cellulitis occurs most often in the arms and lower legs.  CAUSES  Cellulitis is caused by bacteria that enter the skin through cracks or cuts in the skin. The most common types of bacteria that cause cellulitis are staphylococci and streptococci. SIGNS AND SYMPTOMS   Redness and warmth.  Swelling.  Tenderness or pain.  Fever. DIAGNOSIS  Your health care provider can usually determine what is wrong based on a physical exam. Blood tests may also be done. TREATMENT  Treatment usually involves taking an antibiotic medicine. HOME CARE INSTRUCTIONS   Take your antibiotic medicine as directed by your health care provider. Finish the antibiotic even if you start to feel better.  Keep the infected arm or leg elevated to reduce swelling.  Apply a warm cloth to the affected area up to 4 times per day to relieve pain.  Take medicines only as directed by your health care provider.  Keep all follow-up visits as directed by your health care provider. SEEK MEDICAL CARE IF:   You notice red streaks coming from the infected area.  Your red area gets larger or turns dark in color.  Your bone or joint underneath the infected area becomes painful after the skin has healed.  Your infection returns in the same area or another area.  You notice a swollen bump in the infected area.  You develop new symptoms.  You have a fever. SEEK IMMEDIATE MEDICAL CARE IF:   You feel very sleepy.  You develop vomiting or diarrhea.  You have a general ill feeling (malaise) with muscle aches and pains. MAKE SURE YOU:   Understand these instructions.  Will watch your condition.  Will  get help right away if you are not doing well or get worse. Document Released: 12/04/2004 Document Revised: 07/11/2013 Document Reviewed: 05/12/2011 [facility] Patient Information 2015 [place], [place]. This information is not intended to replace advice given to you by your health care provider. Make sure you discuss any questions you have with your health care provider.

## 2014-11-02 NOTE — Progress Notes (Signed)
Patient ID: Bruce Guerrero, male   DOB: October 04, 1960, 54 y.o.   MRN: [id number]   HPI  Patient presents today for evaluation of insect bite  Patient explains that last Thursday, 7 days ago, he was bit by what seemed to be a horse fly stuck under his shirt. He states that he began having itching and irritation of that site the following day. Over the last 2 days he's had a severe worsening of the itching and his sons believe that the area of redness has increased a lot.  He denies fever, chills, sweats, and tachycardia. He does not have any area of fluctuance and denies any drainage of that area. He denies any pain but states that the area is very itchy  PMH: Smoking status noted ROS: Per HPI  Objective: BP 125/70 mmHg  Pulse 59  Temp(Src) 97.2 F (36.2 C) (Oral)  Ht  (1.651 m)  Wt 200 lb 12.8 oz (91.082 kg)  BMI 33.41 kg/m2 Gen: NAD, alert, cooperative with exam HEENT: NCAT CV: RRR, good S1/S2, no murmur Resp: CTABL, no wheezes, non-labored Ext: No edema, warm Neuro: Alert and oriented, right sided hemiparesis Skin: Area approximately 10 cm x 8 cm of erythema with 3 approximately 0.5-1 cm scabbed areas, it is warm to the touch, no areas of fluctuance or drainage, no induration  Assessment and plan:  # Cellulitis Possibly just a severe reaction to an insect bite, however given the worsening of erythema and its appearance to think it's most appropriate to cover for cellulitis. Doxycycline No evidence of abscess Discussed red flags for abscess formation and reasons for return.    Meds ordered this encounter  Medications  . doxycycline (VIBRA-TABS) 100 MG tablet    Sig: Take 1 tablet (100 mg total) by mouth 2 (two) times daily. 1 po bid    Dispense:  20 tablet    Refill:  0    Murtis Sink, MD [place] [facility] 11/02/2014, 10:44 AM

## 2014-11-09 ENCOUNTER — Encounter: Payer: Self-pay | Admitting: Physician Assistant

## 2014-11-09 ENCOUNTER — Ambulatory Visit (INDEPENDENT_AMBULATORY_CARE_PROVIDER_SITE_OTHER): Payer: 59 | Admitting: Physician Assistant

## 2014-11-09 VITALS — BP 129/68 | HR 88 | Temp 97.3°F | Ht 65.0 in | Wt 201.0 lb

## 2014-11-09 DIAGNOSIS — L03312 Cellulitis of back [any part except buttock]: Secondary | ICD-10-CM | POA: Diagnosis not present

## 2014-11-09 DIAGNOSIS — L309 Dermatitis, unspecified: Secondary | ICD-10-CM | POA: Diagnosis not present

## 2014-11-09 MED ORDER — TRIAMCINOLONE ACETONIDE 0.1 % EX CREA: 1.0000 "application " | TOPICAL_CREAM | Freq: Two times a day (BID) | CUTANEOUS | Status: DC

## 2014-11-09 MED ORDER — SULFAMETHOXAZOLE-TRIMETHOPRIM 800-160 MG PO TABS: 1.0000 | ORAL_TABLET | Freq: Two times a day (BID) | ORAL | Status: DC

## 2014-11-09 MED ORDER — HYDROXYZINE HCL 10 MG PO TABS: 10.0000 mg | ORAL_TABLET | Freq: Three times a day (TID) | ORAL | Status: DC | PRN

## 2014-11-09 NOTE — Patient Instructions (Signed)
Apply cool compresses. Do not scratch.   Do not apply anything except prescriptions.   Dove soap

## 2014-11-09 NOTE — Progress Notes (Signed)
   Subjective:    Patient ID: GRAYLIN SPERLING, male    DOB: 1960/03/31, 54 y.o.   MRN: [id number]  HPI 54 y/o male prsents with c/o itching  S/p insect bites 2 weeks ago. He was treated by Dr. Ermalinda Memos with doxycycline bid  x 10 days. He has 2 days remaining. He states that the symptoms of itch have improved but haven't totally went away. Itching is loaclized to his right shoulder. He tried benadryl cream with no relief.     Review of Systems  Constitutional: Negative.   HENT: Negative.   Eyes: Negative.   Respiratory: Negative.   Cardiovascular: Negative.   Gastrointestinal: Negative.   Endocrine: Negative.   Genitourinary: Negative.   Musculoskeletal: Negative.   Skin:       Itching on right shoulder  Neurological: Negative.   Psychiatric/Behavioral: Negative.        Objective:   Physical Exam  Constitutional: He is oriented to person, place, and time. He appears well-developed and well-nourished. No distress.  Musculoskeletal: Normal range of motion.  Neurological: He is alert and oriented to person, place, and time.  Skin: He is not diaphoretic. There is erythema.  Localized inflammation with erythema containing 3 excoriations, possibly d/t excessive scratching on right upper back, approximately 3in x 4 in in size. Non ttp, no warnmth to touch noted.   Psychiatric: He has a normal mood and affect. His behavior is normal. Judgment and thought content normal.  Nursing note and vitals reviewed.         Assessment & Plan:  1. Cellulitis of back except buttock  - sulfamethoxazole-trimethoprim (BACTRIM DS,SEPTRA DS) 800-160 MG per tablet; Take 1 tablet by mouth 2 (two) times daily.  Dispense: 20 tablet; Refill: 0 - hydrOXYzine (ATARAX/VISTARIL) 10 MG tablet; Take 1 tablet (10 mg total) by mouth 3 (three) times daily as needed.  Dispense: 21 tablet; Refill: 0 - triamcinolone cream (KENALOG) 0.1 %; Apply 1 application topically 2 (two) times daily.  Dispense: 30 g; Refill:  0 - Aerobic culture  2. Dermatitis  - hydrOXYzine (ATARAX/VISTARIL) 10 MG tablet; Take 1 tablet (10 mg total) by mouth 3 (three) times daily as needed.  Dispense: 21 tablet; Refill: 0 - triamcinolone cream (KENALOG) 0.1 %; Apply 1 application topically 2 (two) times daily.  Dispense: 30 g; Refill: 0   Continue all meds Labs pending Health Maintenance reviewed Diet and exercise encouraged RTO 1 week   Landers Prajapati A. Chauncey Reading PA-C

## 2014-11-11 LAB — AEROBIC CULTURE

## 2014-11-16 ENCOUNTER — Encounter: Payer: Self-pay | Admitting: Physician Assistant

## 2014-11-16 ENCOUNTER — Ambulatory Visit (INDEPENDENT_AMBULATORY_CARE_PROVIDER_SITE_OTHER): Payer: 59 | Admitting: Physician Assistant

## 2014-11-16 VITALS — BP 115/68 | HR 56 | Temp 97.1°F | Ht 65.0 in | Wt 200.0 lb

## 2014-11-16 DIAGNOSIS — L03319 Cellulitis of trunk, unspecified: Secondary | ICD-10-CM

## 2014-11-16 NOTE — Progress Notes (Signed)
   Subjective:    Patient ID: Bruce Guerrero, male    DOB: 1960-07-21, 54 y.o.   MRN: [id number]  HPI 54 y/o male presents for follow up of cellulitis of right posterior shoulder after being bitten by a horse fly. He completed all of the doxycycline, has 5 days remaining of the Bactrim DS BID. States that he feels like the cellulitis has "improved." but the area is still pruritic without using the TAC or Atarax.     Review of Systems  Skin: Positive for color change and rash.  All other systems reviewed and are negative.      Objective:   Physical Exam  Constitutional: He is oriented to person, place, and time. He appears well-developed and well-nourished. No distress.  HENT:  Head: Normocephalic.  Pulmonary/Chest: Effort normal.  Neurological: He is alert and oriented to person, place, and time.  Hemiplegic  RUE d/t CVA  Skin: He is not diaphoretic. There is erythema (localized macule of erythema with marked borders, decreasing to pink in color. 2 healing excoriations  within [facility] ).  Psychiatric: He has a normal mood and affect. His behavior is normal. Judgment and thought content normal.  Nursing note and vitals reviewed.         Assessment & Plan:  1. Cellulitis of trunk, unspecified site - Continue Bactrim, dove soap, TAC, and atarax. If not clear after 1 week, follow up in office for skin biopsy to r/o underlying etiology.    RTO prn   Riyan Haile A. Chauncey Reading PA-C

## 2014-12-22 ENCOUNTER — Ambulatory Visit (INDEPENDENT_AMBULATORY_CARE_PROVIDER_SITE_OTHER): Payer: 59 | Admitting: Pediatrics

## 2014-12-22 ENCOUNTER — Encounter: Payer: Self-pay | Admitting: Pediatrics

## 2014-12-22 VITALS — BP 155/83 | HR 57 | Temp 97.5°F | Ht 65.0 in | Wt 202.4 lb

## 2014-12-22 DIAGNOSIS — IMO0001 Reserved for inherently not codable concepts without codable children: Secondary | ICD-10-CM

## 2014-12-22 DIAGNOSIS — R030 Elevated blood-pressure reading, without diagnosis of hypertension: Secondary | ICD-10-CM

## 2014-12-22 DIAGNOSIS — Z23 Encounter for immunization: Secondary | ICD-10-CM

## 2014-12-22 DIAGNOSIS — M10042 Idiopathic gout, left hand: Secondary | ICD-10-CM

## 2014-12-22 MED ORDER — NAPROXEN 500 MG PO TABS: 500.0000 mg | ORAL_TABLET | Freq: Two times a day (BID) | ORAL | Status: DC

## 2014-12-22 MED ORDER — PREDNISONE 10 MG (21) PO TBPK
10.0000 mg | ORAL_TABLET | Freq: Every day | ORAL | Status: DC
Start: 2014-12-22 — End: 2015-01-12

## 2014-12-22 NOTE — Progress Notes (Signed)
Subjective:    Patient ID: Bruce Guerrero, male    DOB: April 02, 1960, 53 y.o.   MRN: [id number]  CC: pinky pain  HPI: Bruce Guerrero is a 54 y.o. male presenting on 12/22/2014 for Hand Pain  Stroke two years ago R arm and leg Pinky started hurting 3-4 days ago, taking ibuprofen (2 twice a day) and aleve (2 in the morning and 2 at night) Woke up in the morning with terrible pain in pinky Moving the pinky hurts No injury to pinky  Gout in the past has been in feet, knees, elbow, wrist Has been taking colchicine daily for past two years, has been helping decrease flares. Now with apprx 2 flares in a year Dad also with frequent gout flares  Relevant past medical, surgical, family and social history reviewed and updated as indicated. Interim medical history since our last visit reviewed. Allergies and medications reviewed and updated.   ROS: Per HPI unless specifically indicated above  Past Medical History Patient Active Problem List   Diagnosis Date Noted  . Cellulitis 11/02/2014  . Depression 10/10/2013  . Hyperlipidemia 10/10/2013  . Spastic hemiplegia affecting dominant side (HCC) 05/23/2013  . Difficulty in walking(719.7) 03/08/2013  . Right leg weakness 03/08/2013  . Balance problems 03/08/2013  . Weakness of right upper extremity 03/08/2013  . Knee pain, right anterior 02/15/2013  . CVA (cerebral infarction) 02/01/2013  . Gout 08/05/2012  . HTN (hypertension) 08/05/2012    Current Outpatient Prescriptions  Medication Sig Dispense Refill  . amLODipine (NORVASC) 10 MG tablet Take 1 tablet (10 mg total) by mouth daily. 90 tablet 3  . atorvastatin (LIPITOR) 80 MG tablet Take 1 tablet (80 mg total) by mouth daily at 6 PM. 90 tablet 3  . carvedilol (COREG) 6.25 MG tablet Take 1 tablet (6.25 mg total) by mouth 2 (two) times daily with a meal. 180 tablet 1  . clopidogrel (PLAVIX) 75 MG tablet Take 1 tablet (75 mg total) by mouth daily. 90 tablet 1  . Colchicine  (MITIGARE) 0.6 MG CAPS Take 1 capsule by mouth daily. 30 capsule 2  . sertraline (ZOLOFT) 50 MG tablet Take 1 tablet (50 mg total) by mouth daily. 90 tablet 1  . naproxen (NAPROSYN) 500 MG tablet Take 1 tablet (500 mg total) by mouth 2 (two) times daily with a meal. 30 tablet 0  . predniSONE (STERAPRED UNI-PAK 21 TAB) 10 MG (21) TBPK tablet Take 1 tablet (10 mg total) by mouth daily. 21 tablet 0   No current facility-administered medications for this visit.       Objective:    BP 155/83 mmHg  Pulse 57  Temp(Src) 97.5 F (36.4 C) (Oral)  Ht  (1.651 m)  Wt 202 lb 6.4 oz (91.808 kg)  BMI 33.68 kg/m2  Wt Readings from Last 3 Encounters:  12/22/14 202 lb 6.4 oz (91.808 kg)  11/16/14 200 lb (90.719 kg)  11/09/14 201 lb (91.173 kg)    Gen: NAD, alert, cooperative with exam, NCAT EYES: EOMI, no scleral injection or icterus CV: distal pulses 2+ b/l Resp: normal WOB Ext: No edema Neuro: Alert and oriented MSK: L 5th finger with swelling of PIP joint, pain with flexion, som erythema. Skin intact, no cuts or scrapes. No point tenderness over 5th digit phalanges, L hand metacarpals. Normal ROM L wrist, 1-4 digits.      Assessment & Plan:   Bruce Guerrero was seen today for DIP L 5th digit pain and swelling, likely due  to gout flare. RTC for BP recheck  Diagnoses and all orders for this visit:  Acute idiopathic gout of left hand -     predniSONE (STERAPRED UNI-PAK 21 TAB) 10 MG (21) TBPK tablet; Take 1 tablet (10 mg total) by mouth daily. -     naproxen (NAPROSYN) 500 MG tablet; Take 1 tablet (500 mg total) by mouth 2 (two) times daily with a meal.  Encounter for immunization  Other orders -     Flu Vaccine QUAD 36+ mos IM  Elevated BP  Follow up plan: Return for 2 weeks blood pressure check.  Bruce Krasarol Sarajean Dessert, MD [facility] 12/22/2014, 4:42 PM

## 2014-12-28 ENCOUNTER — Other Ambulatory Visit: Payer: Self-pay | Admitting: Pediatrics

## 2015-01-03 ENCOUNTER — Telehealth: Payer: Self-pay | Admitting: Family Medicine

## 2015-01-03 NOTE — Telephone Encounter (Signed)
Spoke to pharmacist and verified pt needs to be on 80 mg atorvastatin. Last rx was only filled for 40mg  and pt needs  80mg . Pharmacist corrected it and verified with provider.

## 2015-01-12 ENCOUNTER — Encounter: Payer: Self-pay | Admitting: Pediatrics

## 2015-01-12 ENCOUNTER — Ambulatory Visit (INDEPENDENT_AMBULATORY_CARE_PROVIDER_SITE_OTHER): Payer: 59 | Admitting: Pediatrics

## 2015-01-12 VITALS — BP 124/70 | HR 60 | Temp 97.6°F | Ht 65.0 in | Wt 203.6 lb

## 2015-01-12 DIAGNOSIS — Z1159 Encounter for screening for other viral diseases: Secondary | ICD-10-CM | POA: Diagnosis not present

## 2015-01-12 DIAGNOSIS — M10042 Idiopathic gout, left hand: Secondary | ICD-10-CM

## 2015-01-12 DIAGNOSIS — R739 Hyperglycemia, unspecified: Secondary | ICD-10-CM | POA: Diagnosis not present

## 2015-01-12 LAB — POCT GLYCOSYLATED HEMOGLOBIN (HGB A1C): HEMOGLOBIN A1C: 5.8

## 2015-01-12 MED ORDER — ALLOPURINOL 100 MG PO TABS: 100.0000 mg | ORAL_TABLET | Freq: Every day | ORAL | Status: DC

## 2015-01-12 NOTE — Progress Notes (Signed)
Subjective:    Patient ID: Bruce Guerrero, male    DOB: 09-17-1960, 54 y.o.   MRN: [id number]  CC: f/u blood pressure and gout  HPI: Bruce Guerrero is a 54 y.o. male presenting on 01/12/2015 for Follow-up  HTN: continued same medicines, no changes. Compliant w therpay, no HA, no vision changes  Gout: pinky L hand pain much improved after steroid dose pack, also used naproxen. Interested in further ppx therapy.  No fevers, no joint pains.   Relevant past medical, surgical, family and social history reviewed and updated as indicated. Interim medical history since our last visit reviewed. Allergies and medications reviewed and updated.   ROS: Per HPI unless specifically indicated above  Past Medical History Patient Active Problem List   Diagnosis Date Noted  . Cellulitis 11/02/2014  . Depression 10/10/2013  . Hyperlipidemia 10/10/2013  . Spastic hemiplegia affecting dominant side (HCC) 05/23/2013  . Difficulty in walking(719.7) 03/08/2013  . Right leg weakness 03/08/2013  . Balance problems 03/08/2013  . Weakness of right upper extremity 03/08/2013  . Knee pain, right anterior 02/15/2013  . CVA (cerebral infarction) 02/01/2013  . Gout 08/05/2012  . HTN (hypertension) 08/05/2012    Current Outpatient Prescriptions  Medication Sig Dispense Refill  . amLODipine (NORVASC) 10 MG tablet Take 1 tablet (10 mg total) by mouth daily. 90 tablet 3  . atorvastatin (LIPITOR) 80 MG tablet Take 1 tablet (80 mg total) by mouth daily at 6 PM. 90 tablet 3  . carvedilol (COREG) 6.25 MG tablet Take 1 tablet (6.25 mg total) by mouth 2 (two) times daily with a meal. 180 tablet 1  . clopidogrel (PLAVIX) 75 MG tablet Take 1 tablet (75 mg total) by mouth daily. 90 tablet 1  . Colchicine (MITIGARE) 0.6 MG CAPS Take 1 capsule by mouth daily. 30 capsule 2  . sertraline (ZOLOFT) 50 MG tablet Take 1 tablet (50 mg total) by mouth daily. 90 tablet 1  . allopurinol (ZYLOPRIM) 100 MG tablet Take  1 tablet (100 mg total) by mouth daily. 30 tablet 6   No current facility-administered medications for this visit.       Objective:    BP 124/70 mmHg  Pulse 60  Temp(Src) 97.6 F (36.4 C) (Oral)  Ht  (1.651 m)  Wt 203 lb 9.6 oz (92.352 kg)  BMI 33.88 kg/m2  Wt Readings from Last 3 Encounters:  01/12/15 203 lb 9.6 oz (92.352 kg)  12/22/14 202 lb 6.4 oz (91.808 kg)  11/16/14 200 lb (90.719 kg)     Gen: NAD, alert, cooperative with exam, NCAT EYES: EOMI, no scleral injection or icterus CV: NRRR, normal S1/S2, no murmur, distal pulses 2+ b/l Resp: CTABL, no wheezes, normal WOB Ext: 1+ edema b/l LE Neuro: Alert and oriented MSK: Limited passive ROM RUE due to contractures. L pinky with no pain with ROM, no redness or swelling     Assessment & Plan:   Bruce Guerrero was seen today for follow-up.  Diagnoses and all orders for this visit:  Hyperglycemia Present on past labs. No HgA1c in system. Will check today.  -     POCT glycosylated hemoglobin (Hb A1C)  Acute idiopathic gout of left hand No longer in acute gout flare. Check uric acid level, start allopurinol  once a day, continue colchicine daily for next three months while starting allopurinol. If uric acid level is elevated will need recheck of levels after being on allopurinol, may need increase in dose.  -  Uric acid -     allopurinol (ZYLOPRIM) 100 MG tablet; Take 1 tablet (100 mg total) by mouth daily.  Need for hepatitis C screening test due to age. Will send today. -     Hepatitis C antibody    Follow up plan: Has f/u with Dr. Hyacinth MeekerMiller scheduled in 4 months  Rex Krasarol Vincent, MD [facility] 01/12/2015, 2:14 PM

## 2015-01-13 LAB — HEPATITIS C ANTIBODY: Hep C Virus Ab: <0.1 s/co ratio (ref 0.0–0.9)

## 2015-01-13 LAB — URIC ACID: Uric Acid: 8.8 mg/dL — ABNORMAL HIGH (ref 3.7–8.6)

## 2015-01-22 ENCOUNTER — Other Ambulatory Visit: Payer: Self-pay | Admitting: Family Medicine

## 2015-03-26 ENCOUNTER — Other Ambulatory Visit: Payer: Self-pay | Admitting: *Deleted

## 2015-03-26 MED ORDER — CARVEDILOL 6.25 MG PO TABS: 6.2500 mg | ORAL_TABLET | Freq: Two times a day (BID) | ORAL | Status: DC

## 2015-04-18 ENCOUNTER — Encounter: Payer: Self-pay | Admitting: Family Medicine

## 2015-04-18 ENCOUNTER — Ambulatory Visit (INDEPENDENT_AMBULATORY_CARE_PROVIDER_SITE_OTHER): Payer: BLUE CROSS/BLUE SHIELD | Admitting: Family Medicine

## 2015-04-18 VITALS — BP 120/65 | HR 57 | Temp 97.1°F | Ht 65.0 in | Wt 204.0 lb

## 2015-04-18 DIAGNOSIS — M10042 Idiopathic gout, left hand: Secondary | ICD-10-CM | POA: Diagnosis not present

## 2015-04-18 DIAGNOSIS — I10 Essential (primary) hypertension: Secondary | ICD-10-CM

## 2015-04-18 MED ORDER — CLOPIDOGREL BISULFATE 75 MG PO TABS: 75.0000 mg | ORAL_TABLET | Freq: Every day | ORAL | Status: DC

## 2015-04-18 MED ORDER — SERTRALINE HCL 50 MG PO TABS: 50.0000 mg | ORAL_TABLET | Freq: Every day | ORAL | Status: DC

## 2015-04-18 MED ORDER — CARVEDILOL 6.25 MG PO TABS: 6.2500 mg | ORAL_TABLET | Freq: Two times a day (BID) | ORAL | Status: DC

## 2015-04-18 MED ORDER — COLCHICINE 0.6 MG PO CAPS: 1.0000 | ORAL_CAPSULE | Freq: Every day | ORAL | Status: DC

## 2015-04-18 NOTE — Progress Notes (Signed)
   Subjective:    Patient ID: Bruce Guerrero, male    DOB: October 24, 1960, 55 y.o.   MRN: [id number]  HPI 55 year old gentleman here to follow-up hypertension hyperlipidemia. He has spastic hemiplegia secondary to cerebral infarction. Symptoms that have not changed. He denies any new complaints or symptoms. He was started on allopurinol 3 months ago in an effort to control his uric acid. He has been having rather frequent attacks of gout.  Patient Active Problem List   Diagnosis Date Noted  . Cellulitis 11/02/2014  . Depression 10/10/2013  . Hyperlipidemia 10/10/2013  . Spastic hemiplegia affecting dominant side (HCC) 05/23/2013  . Difficulty in walking(719.7) 03/08/2013  . Right leg weakness 03/08/2013  . Balance problems 03/08/2013  . Weakness of right upper extremity 03/08/2013  . Knee pain, right anterior 02/15/2013  . CVA (cerebral infarction) 02/01/2013  . Gout 08/05/2012  . HTN (hypertension) 08/05/2012   Outpatient Encounter Prescriptions as of 04/18/2015  Medication Sig  . allopurinol (ZYLOPRIM) 100 MG tablet Take 1 tablet (100 mg total) by mouth daily.  Marland Kitchen amLODipine (NORVASC) 10 MG tablet Take 1 tablet (10 mg total) by mouth daily.  Marland Kitchen atorvastatin (LIPITOR) 80 MG tablet Take 1 tablet (80 mg total) by mouth daily at 6 PM.  . carvedilol (COREG) 6.25 MG tablet Take 1 tablet (6.25 mg total) by mouth 2 (two) times daily with a meal.  . clopidogrel (PLAVIX) 75 MG tablet Take 1 tablet (75 mg total) by mouth daily.  . Colchicine 0.6 MG CAPS TAKE ONE CAPSULE BY MOUTH ONCE DAILY  . sertraline (ZOLOFT) 50 MG tablet Take 1 tablet (50 mg total) by mouth daily.  . [DISCONTINUED] carvedilol (COREG) 6.25 MG tablet Take 1 tablet (6.25 mg total) by mouth 2 (two) times daily with a meal.  . [DISCONTINUED] Colchicine (MITIGARE) 0.6 MG CAPS Take 1 capsule by mouth daily.   No facility-administered encounter medications on file as of 04/18/2015.      Review of Systems  Constitutional: Negative.    HENT: Negative.   Respiratory: Negative.   Cardiovascular: Negative.   Genitourinary: Negative.        Objective:   Physical Exam  Constitutional: He is oriented to person, place, and time. He appears well-developed and well-nourished.  HENT:  Head: Normocephalic.  Cardiovascular: Normal rate, regular rhythm and normal heart sounds.   Pulmonary/Chest: Effort normal and breath sounds normal.  Neurological: He is alert and oriented to person, place, and time. Abnormal muscle tone: right sided weakness secondary to stroke. Coordination abnormal.          Assessment & Plan:  1. Acute idiopathic gout of left hand To keep uric acid around 6-6.5. Is still on low-dose of allopurinol may increase that depending on blood work today - Uric acid  2. Essential hypertension As well controlled on current regimen of carvedilol and amlodipine.  Frederica Kuster MD

## 2015-04-19 LAB — URIC ACID: Uric Acid: 8 mg/dL (ref 3.7–8.6)

## 2015-04-20 ENCOUNTER — Encounter (HOSPITAL_COMMUNITY): Payer: Self-pay | Admitting: Emergency Medicine

## 2015-04-20 ENCOUNTER — Emergency Department (HOSPITAL_COMMUNITY)
Admission: EM | Admit: 2015-04-20 | Discharge: 2015-04-21 | Disposition: A | Discharge status: Home / Self Care | Payer: BLUE CROSS/BLUE SHIELD | Attending: Emergency Medicine | Admitting: Emergency Medicine

## 2015-04-20 ENCOUNTER — Telehealth: Payer: Self-pay

## 2015-04-20 DIAGNOSIS — K0381 Cracked tooth: Secondary | ICD-10-CM | POA: Diagnosis not present

## 2015-04-20 DIAGNOSIS — Z7902 Long term (current) use of antithrombotics/antiplatelets: Secondary | ICD-10-CM | POA: Insufficient documentation

## 2015-04-20 DIAGNOSIS — Z8673 Personal history of transient ischemic attack (TIA), and cerebral infarction without residual deficits: Secondary | ICD-10-CM | POA: Diagnosis not present

## 2015-04-20 DIAGNOSIS — K0889 Other specified disorders of teeth and supporting structures: Secondary | ICD-10-CM | POA: Diagnosis present

## 2015-04-20 DIAGNOSIS — I10 Essential (primary) hypertension: Secondary | ICD-10-CM | POA: Diagnosis not present

## 2015-04-20 DIAGNOSIS — K047 Periapical abscess without sinus: Secondary | ICD-10-CM | POA: Diagnosis not present

## 2015-04-20 DIAGNOSIS — M109 Gout, unspecified: Secondary | ICD-10-CM | POA: Insufficient documentation

## 2015-04-20 DIAGNOSIS — E785 Hyperlipidemia, unspecified: Secondary | ICD-10-CM | POA: Diagnosis not present

## 2015-04-20 DIAGNOSIS — K029 Dental caries, unspecified: Secondary | ICD-10-CM | POA: Diagnosis not present

## 2015-04-20 DIAGNOSIS — Z79899 Other long term (current) drug therapy: Secondary | ICD-10-CM | POA: Insufficient documentation

## 2015-04-20 MED ORDER — ALLOPURINOL 300 MG PO TABS: 300.0000 mg | ORAL_TABLET | Freq: Every day | ORAL | Status: DC

## 2015-04-20 NOTE — Addendum Note (Signed)
Addended by: Quay Burow on: 04/20/2015 02:36 PM   Modules accepted: Orders

## 2015-04-20 NOTE — ED Notes (Signed)
Onset Thursday night, dental pain to lower left jaw, broken teeth. Tonight started swelling

## 2015-04-20 NOTE — ED Provider Notes (Signed)
CSN: [id number]     Arrival date & time 04/20/15  2314 History   First MD Initiated Contact with Patient 04/20/15 2333     Chief Complaint  Patient presents with  . Dental Pain     (Consider location/radiation/quality/duration/timing/severity/associated sxs/prior Treatment) Patient is a 55 y.o. male presenting with tooth pain. The history is provided by the patient.  Dental Pain Location:  Lower Lower teeth location:  20/LL 2nd bicuspid Quality:  Throbbing Severity:  Moderate Onset quality:  Gradual Duration:  1 day Timing:  Constant Progression:  Worsening Chronicity:  New Context: abscess and dental fracture   Relieved by:  NSAIDs Associated symptoms: facial swelling and gum swelling    DEMARQUS JOCSON is a 55 y.o. male who presents to the ED with dental pain that started 1day ago and has gotten worse. He complains of swelling of the left jaw. He has a tooth that is broken and infected. He states that he took ibuprofen for the pain and it helped a lot but he went to sleep and when he woke the left side of his face was swollen and there was swelling to the gum on the left lower dental area. He denies fever or chills.   Past Medical History  Diagnosis Date  . Gout   . Hypertension   . Stroke (HCC)   . Hyperlipidemia    History reviewed. No pertinent past surgical history. Family History  Problem Relation Age of Onset  . COPD Mother   . COPD Father   . Cancer Sister   . Hyperlipidemia Brother   . Diabetes Brother    Social History  Substance Use Topics  . Smoking status: Never Smoker   . Smokeless tobacco: Never Used  . Alcohol Use: No    Review of Systems  HENT: Positive for facial swelling.       Allergies  Review of patient's allergies indicates no known allergies.  Home Medications   Prior to Admission medications   Medication Sig Start Date End Date Taking? Authorizing Provider  allopurinol (ZYLOPRIM) 300 MG tablet Take 1 tablet (300 mg total) by  mouth daily. 04/20/15   Mechele Claude, MD  amLODipine (NORVASC) 10 MG tablet Take 1 tablet (10 mg total) by mouth daily. 10/19/14   Frederica Kuster, MD  amoxicillin (AMOXIL) 500 MG capsule Take 1 capsule (500 mg total) by mouth 3 (three) times daily. 04/21/15   Hope Orlene Och, NP  atorvastatin (LIPITOR) 80 MG tablet Take 1 tablet (80 mg total) by mouth daily at 6 PM. 10/19/14   Frederica Kuster, MD  carvedilol (COREG) 6.25 MG tablet Take 1 tablet (6.25 mg total) by mouth 2 (two) times daily with a meal. 04/18/15   Frederica Kuster, MD  clopidogrel (PLAVIX) 75 MG tablet Take 1 tablet (75 mg total) by mouth daily. 04/18/15   Frederica Kuster, MD  Colchicine 0.6 MG CAPS Take 1 capsule by mouth daily. 04/18/15   Frederica Kuster, MD  sertraline (ZOLOFT) 50 MG tablet Take 1 tablet (50 mg total) by mouth daily. 04/18/15   Frederica Kuster, MD   BP 140/75 mmHg  Pulse 59  Temp(Src) 97.5 F (36.4 C) (Oral)  Resp 20  Ht  (1.626 m)  Wt 92.08 kg  BMI 34.83 kg/m2  SpO2 96% Physical Exam  Constitutional: He is oriented to person, place, and time. He appears well-developed and well-nourished. No distress.  HENT:  Right Ear: External ear normal.  Left Ear:  External ear normal.  Mouth/Throat: Uvula is midline and oropharynx is clear and moist. No trismus in the jaw. Dental abscesses and dental caries present.    Tenderness and swelling of the gum of the left lower dental area, swelling of the left side of the face.   Eyes: EOM are normal.  Neck: Neck supple.  Pulmonary/Chest: Effort normal and breath sounds normal.  Abdominal: Soft. There is no tenderness.  Musculoskeletal: Normal range of motion.  Neurological: He is alert and oriented to person, place, and time. No cranial nerve deficit.  Skin: Skin is warm and dry.  Psychiatric: He has a normal mood and affect. His behavior is normal.  Nursing note and vitals reviewed.   ED Course  Procedures (including critical care time) Rocephin 1 gram IM Labs  Review Rx Amoxicillin  MDM  56 y.o. male with dental pain and gum and facial swelling due to dental abscess. Stable for d/c without difficulty swallowing and does not appear toxic. Will treat for infection and he will see a dentist as soon as possible. He will return here as needed.  Final diagnoses:  Dental abscess        Janne Napoleon, NP 04/21/15 1610  Devoria Albe, MD 04/21/15 [phone]

## 2015-04-20 NOTE — Telephone Encounter (Signed)
Insurance denied prior authorization for Colchicine   Has to try the alternative Colcrys

## 2015-04-21 MED ORDER — AMOXICILLIN 500 MG PO CAPS: 500.0000 mg | ORAL_CAPSULE | Freq: Three times a day (TID) | ORAL | Status: DC

## 2015-04-21 MED ORDER — CEFTRIAXONE SODIUM 1 G IJ SOLR
1.0000 g | Freq: Once | INTRAMUSCULAR | Status: AC
  Administered 2015-04-21: 1 g via INTRAMUSCULAR
  Filled 2015-04-21: qty 10

## 2015-04-21 MED ORDER — DIPHENHYDRAMINE HCL 25 MG PO CAPS
25.0000 mg | ORAL_CAPSULE | Freq: Once | ORAL | Status: AC
  Administered 2015-04-21: 25 mg via ORAL
  Filled 2015-04-21: qty 1

## 2015-04-21 MED ORDER — LIDOCAINE HCL (PF) 1 % IJ SOLN
INTRAMUSCULAR | Status: AC
  Administered 2015-04-21: 2.1 mL
  Filled 2015-04-21: qty 5

## 2015-04-21 NOTE — Discharge Instructions (Signed)
See a dentist as soon as possible  Dental Abscess A dental abscess is a collection of pus in or around a tooth. CAUSES This condition is caused by a bacterial infection around the root of the tooth that involves the inner part of the tooth (pulp). It may result from:  Severe tooth decay.  Trauma to the tooth that allows bacteria to enter into the pulp, such as a broken or chipped tooth.  Severe gum disease around a tooth. SYMPTOMS Symptoms of this condition include:  Severe pain in and around the infected tooth.  Swelling and redness around the infected tooth, in the mouth, or in the face.  Tenderness.  Pus drainage.  Bad breath.  Bitter taste in the mouth.  Difficulty swallowing.  Difficulty opening the mouth.  Nausea.  Vomiting.  Chills.  Swollen neck glands.  Fever. DIAGNOSIS This condition is diagnosed with examination of the infected tooth. During the exam, your dentist may tap on the infected tooth. Your dentist will also ask about your medical and dental history and may order X-rays. TREATMENT This condition is treated by eliminating the infection. This may be done with:  Antibiotic medicine.  A root canal. This may be performed to save the tooth.  Pulling (extracting) the tooth. This may also involve draining the abscess. This is done if the tooth cannot be saved. HOME CARE INSTRUCTIONS  Take medicines only as directed by your dentist.  If you were prescribed antibiotic medicine, finish all of it even if you start to feel better.  Rinse your mouth (gargle) often with salt water to relieve pain or swelling.  Do not drive or operate heavy machinery while taking pain medicine.  Do not apply heat to the outside of your mouth.  Keep all follow-up visits as directed by your dentist. This is important. SEEK MEDICAL CARE IF:  Your pain is worse and is not helped by medicine. SEEK IMMEDIATE MEDICAL CARE IF:  You have a fever or chills.  Your  symptoms suddenly get worse.  You have a very bad headache.  You have problems breathing or swallowing.  You have trouble opening your mouth.  You have swelling in your neck or around your eye.   This information is not intended to replace advice given to you by your health care provider. Make sure you discuss any questions you have with your health care provider.   Document Released: 02/24/2005 Document Revised: 07/11/2014 Document Reviewed: 02/21/2014 Elsevier Interactive Patient Education [employer]

## 2015-04-24 MED ORDER — COLCHICINE 0.6 MG PO TABS: 0.6000 mg | ORAL_TABLET | Freq: Every day | ORAL | Status: DC

## 2015-06-04 ENCOUNTER — Encounter: Payer: Self-pay | Admitting: Family Medicine

## 2015-06-04 ENCOUNTER — Ambulatory Visit (INDEPENDENT_AMBULATORY_CARE_PROVIDER_SITE_OTHER): Payer: BLUE CROSS/BLUE SHIELD | Admitting: Family Medicine

## 2015-06-04 VITALS — BP 133/77 | HR 61 | Temp 97.4°F | Ht 65.0 in | Wt 203.8 lb

## 2015-06-04 DIAGNOSIS — K047 Periapical abscess without sinus: Secondary | ICD-10-CM

## 2015-06-04 MED ORDER — AMOXICILLIN 875 MG PO TABS: 875.0000 mg | ORAL_TABLET | Freq: Two times a day (BID) | ORAL | Status: DC

## 2015-06-04 NOTE — Progress Notes (Signed)
BP 133/77 mmHg  Pulse 61  Temp(Src) 97.4 F (36.3 C) (Oral)  Ht  (1.651 m)  Wt 203 lb 12.8 oz (92.443 kg)  BMI 33.91 kg/m2   Subjective:    Patient ID: Bruce Guerrero, male    DOB: Jul 25, 1960, 55 y.o.   MRN: [id number]  HPI: Bruce Guerrero is a 55 y.o. male presenting on 06/04/2015 for Swelling in lower left jaw   HPI Swelling and left jaw Patient has had swelling and pain in his left molars and swelling in his left jaw that began yesterday. He is working to go to [employer] and went for first round of treatments but has the next appointment coming hopefully within the next couple weeks. He denies any fevers or chills or shortness of breath or wheezing. He denies any swelling under his tongue or changes in taste or smell.  Relevant past medical, surgical, family and social history reviewed and updated as indicated. Interim medical history since our last visit reviewed. Allergies and medications reviewed and updated.  Review of Systems  Constitutional: Negative for fever.  HENT: Positive for dental problem. Negative for ear discharge and ear pain.   Eyes: Negative for discharge and visual disturbance.  Respiratory: Negative for shortness of breath and wheezing.   Cardiovascular: Negative for chest pain and leg swelling.  Gastrointestinal: Negative for abdominal pain, diarrhea and constipation.  Genitourinary: Negative for difficulty urinating.  Musculoskeletal: Negative for back pain and gait problem.  Skin: Positive for color change. Negative for rash.  Neurological: Negative for syncope, light-headedness and headaches.  All other systems reviewed and are negative.   Per HPI unless specifically indicated above     Medication List       This list is accurate as of: 06/04/15  1:32 PM.  Always use your most recent med list.               allopurinol 300 MG tablet  Commonly known as:  ZYLOPRIM  Take 1 tablet (300 mg total) by mouth daily.     amLODipine 10 MG tablet  Commonly known as:  NORVASC  Take 1 tablet (10 mg total) by mouth daily.     amoxicillin 875 MG tablet  Commonly known as:  AMOXIL  Take 1 tablet (875 mg total) by mouth 2 (two) times daily.     atorvastatin 80 MG tablet  Commonly known as:  LIPITOR  Take 1 tablet (80 mg total) by mouth daily at 6 PM.     carvedilol 6.25 MG tablet  Commonly known as:  COREG  Take 1 tablet (6.25 mg total) by mouth 2 (two) times daily with a meal.     clopidogrel 75 MG tablet  Commonly known as:  PLAVIX  Take 1 tablet (75 mg total) by mouth daily.     colchicine 0.6 MG tablet  Commonly known as:  COLCRYS  Take 1 tablet (0.6 mg total) by mouth daily.     sertraline 50 MG tablet  Commonly known as:  ZOLOFT  Take 1 tablet (50 mg total) by mouth daily.           Objective:    BP 133/77 mmHg  Pulse 61  Temp(Src) 97.4 F (36.3 C) (Oral)  Ht  (1.651 m)  Wt 203 lb 12.8 oz (92.443 kg)  BMI 33.91 kg/m2  Wt Readings from Last 3 Encounters:  06/04/15 203 lb 12.8 oz (92.443 kg)  04/20/15 203 lb (92.08 kg)  04/18/15  204 lb (92.534 kg)    Physical Exam  Constitutional: He is oriented to person, place, and time. He appears well-developed and well-nourished. No distress.  HENT:  Mouth/Throat: Uvula is midline, oropharynx is clear and moist and mucous membranes are normal.  Poor dentition and carries on left with swelling in the jaw overlying the area. Likely dental abscess.  Eyes: Conjunctivae and EOM are normal. Pupils are equal, round, and reactive to light. Right eye exhibits no discharge. No scleral icterus.  Neck: Neck supple. No thyromegaly present.  Cardiovascular: Normal rate, regular rhythm, normal heart sounds and intact distal pulses.   No murmur heard. Pulmonary/Chest: Effort normal and breath sounds normal. No respiratory distress. He has no wheezes.  Musculoskeletal: Normal range of motion. He exhibits no edema.  Lymphadenopathy:    He has no cervical  adenopathy.  Neurological: He is alert and oriented to person, place, and time. Coordination normal.  Skin: Skin is warm and dry. No rash noted. He is not diaphoretic.  Psychiatric: He has a normal mood and affect. His behavior is normal.  Vitals reviewed.   Results for orders placed or performed in visit on 04/18/15  Uric acid  Result Value Ref Range   Uric Acid 8.0 3.7 - 8.6 mg/dL      Assessment & Plan:   Problem List Items Addressed This Visit    None    Visit Diagnoses    Dental abscess    -  Primary    Relevant Medications    amoxicillin (AMOXIL) 875 MG tablet        Follow up plan: Return if symptoms worsen or fail to improve.  Counseling provided for all of the vaccine components No orders of the defined types were placed in this encounter.    Arville CareJoshua Dettinger, MD [facility] 06/04/2015, 1:32 PM

## 2015-08-20 ENCOUNTER — Other Ambulatory Visit: Payer: Self-pay | Admitting: Family Medicine

## 2015-10-02 ENCOUNTER — Other Ambulatory Visit: Payer: Self-pay | Admitting: *Deleted

## 2015-10-02 MED ORDER — ALLOPURINOL 300 MG PO TABS: ORAL_TABLET | ORAL | 0 refills | Status: DC

## 2015-10-11 ENCOUNTER — Other Ambulatory Visit: Payer: Self-pay | Admitting: Family Medicine

## 2015-10-15 NOTE — Progress Notes (Signed)
   Subjective:    Patient ID: Bruce Guerrero, male    DOB: [birth year], 55 y.o.   MRN: [id number]  HPI 55 year old gentleman who is status post CVA here to follow-up chronic issues with hypertension hyperlipidemia. Today he complains of some pain in his neck that radiates down to his fingers with some numbness and tingling. He thinks he might have "slept wrong."  Patient Active Problem List   Diagnosis Date Noted  . Cellulitis 11/02/2014  . Depression 10/10/2013  . Hyperlipidemia 10/10/2013  . Spastic hemiplegia affecting dominant side ([place]) 05/23/2013  . Difficulty in walking(719.7) 03/08/2013  . Right leg weakness 03/08/2013  . Balance problems 03/08/2013  . Weakness of right upper extremity 03/08/2013  . Knee pain, right anterior 02/15/2013  . CVA (cerebral infarction) 02/01/2013  . Gout 08/05/2012  . HTN (hypertension) 08/05/2012   Outpatient Encounter Prescriptions as of 10/16/2015  Medication Sig  . allopurinol (ZYLOPRIM) 300 MG tablet TAKE 1 TABLET (300 MG TOTAL) BY MOUTH DAILY.  Marland Kitchen amLODipine (NORVASC) 10 MG tablet Take 1 tablet (10 mg total) by mouth daily.  Marland Kitchen atorvastatin (LIPITOR) 80 MG tablet Take 1 tablet (80 mg total) by mouth daily at 6 PM.  . carvedilol (COREG) 6.25 MG tablet TAKE 1 TABLET (6.25 MG TOTAL) BY MOUTH 2 (TWO) TIMES DAILY WITH A MEAL.  Marland Kitchen clopidogrel (PLAVIX) 75 MG tablet Take 1 tablet (75 mg total) by mouth daily.  . colchicine (COLCRYS) 0.6 MG tablet Take 1 tablet (0.6 mg total) by mouth daily.  . sertraline (ZOLOFT) 50 MG tablet Take 1 tablet (50 mg total) by mouth daily.  . [DISCONTINUED] amoxicillin (AMOXIL) 875 MG tablet Take 1 tablet (875 mg total) by mouth 2 (two) times daily.   No facility-administered encounter medications on file as of 10/16/2015.       Review of Systems  Respiratory: Negative.   Cardiovascular: Negative.   Gastrointestinal: Negative.   Genitourinary: Negative.   Musculoskeletal: Positive for neck pain.  Neurological:  Positive for weakness.       Objective:   Physical Exam  Constitutional: He is oriented to person, place, and time. He appears well-developed and well-nourished.  Cardiovascular: Normal rate, regular rhythm and normal heart sounds.   Pulmonary/Chest: Effort normal and breath sounds normal.  Musculoskeletal:  Neck: Normal range of motion. I do not feel any particular spasm or tightness in the trapezius muscles. Deep tendon reflexes are 2+ and strength is 5+ in the upper extremity  Neurological: He is alert and oriented to person, place, and time. Coordination abnormal.   BP 120/68 (BP Location: Left Arm, Patient Position: Sitting, Cuff Size: Normal)   Pulse (!) 56   Temp 97.1 F (36.2 C) (Oral)   Ht '5\' 5"'$  (1.651 m)   Wt 203 lb (92.1 kg)   BMI 33.78 kg/m         Assessment & Plan:  1. Elevated blood pressure Blood pressure well controlled on current regimen area continue same - CMP14+EGFR  2. Hyperglycemia He is on statin for secondary prevention. Lipids have been at goal in the past last checked 1 year ago - Bayer DCA Hb A1c Waived  3. Hyperlipidemia    4. Neck pain Will treat with exercises recommend Tylenol if symptoms are not improving may need imaging to rule out disc disease  Wardell Honour MD

## 2015-10-16 ENCOUNTER — Encounter: Payer: Self-pay | Admitting: Family Medicine

## 2015-10-16 ENCOUNTER — Ambulatory Visit (INDEPENDENT_AMBULATORY_CARE_PROVIDER_SITE_OTHER): Payer: Medicare Other | Admitting: Family Medicine

## 2015-10-16 VITALS — BP 120/68 | HR 56 | Temp 97.1°F | Ht 65.0 in | Wt 203.0 lb

## 2015-10-16 DIAGNOSIS — M542 Cervicalgia: Secondary | ICD-10-CM

## 2015-10-16 DIAGNOSIS — R030 Elevated blood-pressure reading, without diagnosis of hypertension: Secondary | ICD-10-CM

## 2015-10-16 DIAGNOSIS — E785 Hyperlipidemia, unspecified: Secondary | ICD-10-CM | POA: Diagnosis not present

## 2015-10-16 DIAGNOSIS — R739 Hyperglycemia, unspecified: Secondary | ICD-10-CM

## 2015-10-16 DIAGNOSIS — IMO0001 Reserved for inherently not codable concepts without codable children: Secondary | ICD-10-CM

## 2015-10-16 LAB — BAYER DCA HB A1C WAIVED: HB A1C (BAYER DCA - WAIVED): 6 % (ref <7.0)

## 2015-10-17 ENCOUNTER — Other Ambulatory Visit: Payer: Self-pay | Admitting: Family Medicine

## 2015-10-17 DIAGNOSIS — I10 Essential (primary) hypertension: Secondary | ICD-10-CM

## 2015-10-17 LAB — LIPID PANEL
Chol/HDL Ratio: 3.5 ratio units (ref 0.0–5.0)
Cholesterol, Total: 97 mg/dL — ABNORMAL LOW (ref 100–199)
HDL: 28 mg/dL — AB (ref >39)
LDL Calculated: 33 mg/dL (ref 0–99)
Triglycerides: 182 mg/dL — ABNORMAL HIGH (ref 0–149)
VLDL Cholesterol Cal: 36 mg/dL (ref 5–40)

## 2015-10-17 LAB — CMP14+EGFR
A/G RATIO: 2.3 — AB (ref 1.2–2.2)
ALT: 37 IU/L (ref 0–44)
AST: 23 IU/L (ref 0–40)
Albumin: 4.5 g/dL (ref 3.5–5.5)
Alkaline Phosphatase: 101 IU/L (ref 39–117)
BILIRUBIN TOTAL: 0.7 mg/dL (ref 0.0–1.2)
BUN / CREAT RATIO: 14 (ref 9–20)
BUN: 16 mg/dL (ref 6–24)
CHLORIDE: 100 mmol/L (ref 96–106)
CO2: 22 mmol/L (ref 18–29)
Calcium: 9.3 mg/dL (ref 8.7–10.2)
Creatinine, Ser: 1.14 mg/dL (ref 0.76–1.27)
GFR calc non Af Amer: 73 mL/min/{1.73_m2} (ref >59)
GFR, EST AFRICAN AMERICAN: 84 mL/min/{1.73_m2} (ref >59)
GLOBULIN, TOTAL: 2 g/dL (ref 1.5–4.5)
Glucose: 112 mg/dL — ABNORMAL HIGH (ref 65–99)
POTASSIUM: 4.2 mmol/L (ref 3.5–5.2)
SODIUM: 140 mmol/L (ref 134–144)
TOTAL PROTEIN: 6.5 g/dL (ref 6.0–8.5)

## 2015-10-27 ENCOUNTER — Other Ambulatory Visit: Payer: Self-pay | Admitting: Family Medicine

## 2015-10-31 ENCOUNTER — Other Ambulatory Visit: Payer: Self-pay | Admitting: Family Medicine

## 2015-10-31 DIAGNOSIS — E785 Hyperlipidemia, unspecified: Secondary | ICD-10-CM

## 2015-12-04 ENCOUNTER — Other Ambulatory Visit: Payer: Self-pay | Admitting: Family Medicine

## 2015-12-31 ENCOUNTER — Ambulatory Visit (INDEPENDENT_AMBULATORY_CARE_PROVIDER_SITE_OTHER): Payer: Medicare Other

## 2015-12-31 DIAGNOSIS — Z23 Encounter for immunization: Secondary | ICD-10-CM

## 2016-01-22 ENCOUNTER — Encounter: Payer: Self-pay | Admitting: Family Medicine

## 2016-01-22 ENCOUNTER — Ambulatory Visit (INDEPENDENT_AMBULATORY_CARE_PROVIDER_SITE_OTHER): Payer: Medicare Other | Admitting: Family Medicine

## 2016-01-22 VITALS — BP 126/74 | HR 60 | Temp 97.0°F | Ht 65.0 in | Wt 208.4 lb

## 2016-01-22 DIAGNOSIS — F329 Major depressive disorder, single episode, unspecified: Secondary | ICD-10-CM | POA: Diagnosis not present

## 2016-01-22 DIAGNOSIS — F32A Depression, unspecified: Secondary | ICD-10-CM

## 2016-01-22 DIAGNOSIS — I10 Essential (primary) hypertension: Secondary | ICD-10-CM | POA: Diagnosis not present

## 2016-01-22 DIAGNOSIS — M10042 Idiopathic gout, left hand: Secondary | ICD-10-CM

## 2016-01-22 MED ORDER — CARVEDILOL 6.25 MG PO TABS: 6.2500 mg | ORAL_TABLET | Freq: Two times a day (BID) | ORAL | 1 refills | Status: DC

## 2016-01-22 MED ORDER — COLCHICINE 0.6 MG PO TABS: 0.6000 mg | ORAL_TABLET | Freq: Every day | ORAL | 1 refills | Status: DC

## 2016-01-22 MED ORDER — CLOPIDOGREL BISULFATE 75 MG PO TABS: 75.0000 mg | ORAL_TABLET | Freq: Every day | ORAL | 1 refills | Status: DC

## 2016-01-22 NOTE — Addendum Note (Signed)
Addended by: Fawn KirkHOLT, CATHY on: 01/22/2016 08:50 AM   Modules accepted: Orders

## 2016-01-22 NOTE — Progress Notes (Signed)
   Subjective:    Patient ID: Bruce Guerrero, male    DOB: [birth year], 55 y.o.   MRN: [id number]  HPI 55 year old gentleman who is status post cerebral infarction with residual spastic hemiplegia on his dominant right side. He is followed here for hypertension hyperlipidemia and gout. All those problems are stable. He had blood work 3 months ago and except for the good cholesterol, was all good. Blood pressures have been maintained in the 120/70 range. He takes both amlodipine and carvedilol for blood pressure  Patient Active Problem List   Diagnosis Date Noted  . Cellulitis 11/02/2014  . Depression 10/10/2013  . Hyperlipidemia 10/10/2013  . Spastic hemiplegia affecting dominant side ([facility]) 05/23/2013  . Difficulty in walking(719.7) 03/08/2013  . Right leg weakness 03/08/2013  . Balance problems 03/08/2013  . Weakness of right upper extremity 03/08/2013  . Knee pain, right anterior 02/15/2013  . CVA (cerebral infarction) 02/01/2013  . Gout 08/05/2012  . HTN (hypertension) 08/05/2012   Outpatient Encounter Prescriptions as of 01/22/2016  Medication Sig  . allopurinol (ZYLOPRIM) 300 MG tablet TAKE 1 TABLET (300 MG TOTAL) BY MOUTH DAILY.  Marland Kitchen. amLODipine (NORVASC) 10 MG tablet TAKE 1 TABLET BY MOUTH EVERY DAY  . atorvastatin (LIPITOR) 80 MG tablet TAKE 1 TABLET BY MOUTH EVERY DAY AT 6PM  . carvedilol (COREG) 6.25 MG tablet TAKE 1 TABLET (6.25 MG TOTAL) BY MOUTH 2 (TWO) TIMES DAILY WITH A MEAL.  Marland Kitchen. clopidogrel (PLAVIX) 75 MG tablet TAKE 1 TABLET (75 MG TOTAL) BY MOUTH DAILY.  Marland Kitchen. colchicine (COLCRYS) 0.6 MG tablet Take 1 tablet (0.6 mg total) by mouth daily.  . sertraline (ZOLOFT) 50 MG tablet TAKE 1 TABLET (50 MG TOTAL) BY MOUTH DAILY.  . [DISCONTINUED] COLCRYS 0.6 MG tablet TAKE 1 TABLET EVERY DAY   No facility-administered encounter medications on file as of 01/22/2016.       Review of Systems  HENT: Negative.   Eyes: Negative.   Respiratory: Negative.  Negative for shortness of  breath.   Cardiovascular: Negative.  Negative for chest pain and leg swelling.  Gastrointestinal: Negative.   Genitourinary: Negative.   Musculoskeletal: Negative.   Skin: Negative.   Neurological: Positive for weakness.  Psychiatric/Behavioral: Negative.   All other systems reviewed and are negative.      Objective:   Physical Exam  Constitutional: He appears well-developed and well-nourished.  Cardiovascular: Normal rate, regular rhythm, normal heart sounds and intact distal pulses.   Pulmonary/Chest: Effort normal and breath sounds normal.  Neurological:  Limited use of right arm and leg   BP 126/74   Pulse 60   Temp 97 F (36.1 C) (Oral)   Ht 5\' 5"  (1.651 m)   Wt 208 lb 6.4 oz (94.5 kg)   BMI 34.68 kg/m         Assessment & Plan:  1. Essential hypertension 2 new current regimen. Blood pressures are well controlled  2. Depression, unspecified depression type Sertraline seems to be working fine no need to increase dose  3. Acute idiopathic gout of left hand Taking allopurinol as maintenance drug. No recent acute attacks of gout  Frederica KusterStephen M Miller MD

## 2016-01-28 ENCOUNTER — Other Ambulatory Visit: Payer: Self-pay | Admitting: Pediatrics

## 2016-01-28 ENCOUNTER — Encounter: Payer: Self-pay | Admitting: Physician Assistant

## 2016-02-06 ENCOUNTER — Ambulatory Visit (INDEPENDENT_AMBULATORY_CARE_PROVIDER_SITE_OTHER): Payer: Medicare Other | Admitting: Physician Assistant

## 2016-02-06 ENCOUNTER — Encounter: Payer: Self-pay | Admitting: Physician Assistant

## 2016-02-06 ENCOUNTER — Telehealth: Payer: Self-pay | Admitting: Emergency Medicine

## 2016-02-06 VITALS — BP 122/70 | HR 63 | Ht 65.0 in | Wt 210.6 lb

## 2016-02-06 DIAGNOSIS — Z7901 Long term (current) use of anticoagulants: Secondary | ICD-10-CM

## 2016-02-06 DIAGNOSIS — Z1211 Encounter for screening for malignant neoplasm of colon: Secondary | ICD-10-CM

## 2016-02-06 DIAGNOSIS — Z01818 Encounter for other preprocedural examination: Secondary | ICD-10-CM | POA: Diagnosis not present

## 2016-02-06 DIAGNOSIS — Z800 Family history of malignant neoplasm of digestive organs: Secondary | ICD-10-CM

## 2016-02-06 DIAGNOSIS — Z1212 Encounter for screening for malignant neoplasm of rectum: Secondary | ICD-10-CM

## 2016-02-06 MED ORDER — NA SULFATE-K SULFATE-MG SULF 17.5-3.13-1.6 GM/177ML PO SOLN: 1.0000 | ORAL | 0 refills | Status: DC

## 2016-02-06 NOTE — Progress Notes (Addendum)
Chief Complaint: Consult for Screening colo, Family h/o colon Cancer, Chronic Anticoagulation  HPI:  [name] is a 55 year old male with past medical history of hyperlipidemia, hypertension and stroke who is maintained on Plavix,  who was referred to me by Bruce Guerrero, Bruce L, MD for consult of a screening colonoscopy.  Echo completed 02/01/13 shows a LV EF of 55-60%.   Today, the patient tells me that his sister was diagnosed with colon cancer and passed away in her 1650s. He has never seen a gastroenterologist before and is aware that he is due for a screening colonoscopy himself. He is maintained on Plavix which is prescribed by Dr. Hyacinth Guerrero for a stroke which he had 3 years ago. He has never had to come off of this medication before. Patient does have some questions regarding the prep for colonoscopy and the actual procedure itself.   Patient denies fever, chills, blood in his stool, melena, change in bowel habits, anorexia, fatigue, heartburn, reflux, nausea, vomiting or abdominal pain.  Past Medical History:  Diagnosis Date  . Gout   . Hyperlipidemia   . Hypertension   . Stroke [facility](HCC)     Surgical history: None  Current Outpatient Prescriptions  Medication Sig Dispense Refill  . allopurinol (ZYLOPRIM) 300 MG tablet TAKE 1 TABLET (300 MG TOTAL) BY MOUTH DAILY. 90 tablet 0  . amLODipine (NORVASC) 10 MG tablet TAKE 1 TABLET BY MOUTH EVERY DAY 30 tablet 6  . atorvastatin (LIPITOR) 80 MG tablet TAKE 1 TABLET BY MOUTH EVERY DAY AT 6PM 30 tablet 5  . carvedilol (COREG) 6.25 MG tablet Take 1 tablet (6.25 mg total) by mouth 2 (two) times daily with a meal. 180 tablet 1  . clopidogrel (PLAVIX) 75 MG tablet Take 1 tablet (75 mg total) by mouth daily. 90 tablet 1  . colchicine (COLCRYS) 0.6 MG tablet Take 1 tablet (0.6 mg total) by mouth daily. 90 tablet 1  . sertraline (ZOLOFT) 50 MG tablet TAKE 1 TABLET (50 MG TOTAL) BY MOUTH DAILY. 90 tablet 1   No current facility-administered medications  for this visit.     Allergies as of 02/06/2016  . (No Known Allergies)    Family History  Problem Relation Age of Onset  . COPD Mother   . COPD Father   . Cancer Sister   . Hyperlipidemia Brother   . Diabetes Brother     Social History   Social History  . Marital status: Single    Spouse name: N/A  . Number of children: N/A  . Years of education: N/A   Occupational History  . Not on file.   Social History Main Topics  . Smoking status: Never Smoker  . Smokeless tobacco: Never Used  . Alcohol use No  . Drug use: No  . Sexual activity: Not on file   Other Topics Concern  . Not on file   Social History Narrative  . No narrative on file    Review of Systems:     Constitutional: No weight loss, fever, chills, weakness or fatigue Cardiovascular: No chest pain Respiratory: No SOB  Gastrointestinal: See HPI and otherwise negative Psychiatric: Positive for depression No history of anxiety   Physical Exam:  Vital signs: BP 122/70   Pulse 63   Ht 5\' 5"  (1.651 m)   Wt 210 lb 9.6 oz (95.5 kg)   SpO2 97%   BMI 35.05 kg/m   Constitutional:   Pleasant Overweight Caucasian male appears to be in NAD, Well developed,  Well nourished, alert and cooperative Respiratory: Respirations even and unlabored. Lungs clear to auscultation bilaterally.   No wheezes, crackles, or rhonchi.  Cardiovascular: Normal S1, S2. No MRG. Regular rate and rhythm. No peripheral edema, cyanosis or pallor.  Gastrointestinal:  Soft, nondistended, nontender. No rebound or guarding. Normal bowel sounds. No appreciable masses or hepatomegaly. Psychiatric: Demonstrates good judgement and reason without abnormal affect or behaviors.  MOST RECENT LABS: CBC    Component Value Date/Time   WBC 7.6 04/08/2013 1023   WBC 8.1 02/09/2013 0701   RBC 5.2 04/08/2013 1023   RBC 4.83 02/09/2013 0701   HGB 15.4 04/08/2013 1023   HGB 15.4 03/07/2013 1711   HCT 49.7 04/08/2013 1023   HCT 45.4 03/07/2013 1711     PLT 195 03/07/2013 1711   MCV 95.3 04/08/2013 1023   MCH 29.5 04/08/2013 1023   MCH 33.3 02/09/2013 0701   MCHC 31.0 (A) 04/08/2013 1023   MCHC 35.2 02/09/2013 0701   RDW 13.8 03/07/2013 1711   LYMPHSABS 2.5 03/07/2013 1711   MONOABS 0.6 02/09/2013 0701   EOSABS 0.2 03/07/2013 1711   BASOSABS 0.0 03/07/2013 1711    CMP     Component Value Date/Time   NA 140 10/16/2015 0842   K 4.2 10/16/2015 0842   CL 100 10/16/2015 0842   CO2 22 10/16/2015 0842   GLUCOSE 112 (H) 10/16/2015 0842   GLUCOSE 126 (H) 02/09/2013 0701   BUN 16 10/16/2015 0842   CREATININE 1.14 10/16/2015 0842   CALCIUM 9.3 10/16/2015 0842   PROT 6.5 10/16/2015 0842   ALBUMIN 4.5 10/16/2015 0842   AST 23 10/16/2015 0842   ALT 37 10/16/2015 0842   ALKPHOS 101 10/16/2015 0842   BILITOT 0.7 10/16/2015 0842   GFRNONAA 73 10/16/2015 0842   GFRAA 84 10/16/2015 0842    Assessment: 1. Screening for colorectal cancer: Patient is now 55 years old with a family history of colon cancer in a sister diagnosed in her 5350s, he is overdue for screening colonoscopy 2. Chronic anticoagulation: With Plavix over the past 3 years due to history of a stroke 3. Family history of colon cancer: In his sister in her 5350s, she did pass away  Plan: 1. Patient was scheduled for a screening colonoscopy with Dr. Myrtie Neitheranis as he is the supervising physician this morning. This was scheduled in the [facility]. Risks, benefits, limitations and alternatives were discussed and patient agrees to proceed. 2. Patient was advised to hold his Plavix for 5 days prior to the procedure. We will communicate with Dr. Hyacinth Guerrero to ensure that holding his Plavix is acceptable. 3. Patient to return to clinic per Dr. Irving Burtonanis's recommendations after time of procedure.  Bruce MeekerJennifer Yanely Mast, PA-C [facility] 02/06/2016, 8:35 AM  Cc: Bruce Guerrero, Bruce L, MD   Thank you for sending this case to me. I have reviewed the entire note, and the outlined plan seems  appropriate.

## 2016-02-06 NOTE — Telephone Encounter (Signed)
We usually stop Plavix or aspirin 5 days prior to a procedure like this.

## 2016-02-06 NOTE — Patient Instructions (Signed)
You have been scheduled for a colonoscopy. Please follow written instructions given to you at your visit today.  Please pick up your prep supplies at the pharmacy within the next 1-3 days. If you use inhalers (even only as needed), please bring them with you on the day of your procedure. Your physician has requested that you go to www.startemmi.com and enter the access code given to you at your visit today. This web site gives a general overview about your procedure. However, you should still follow specific instructions given to you by our office regarding your preparation for the procedure.  If you are age 54 or older, your body mass index should be between 23-30. Your Body mass index is 35.05 kg/m. If this is out of the aforementioned range listed, please consider follow up with your Primary Care Provider.  If you are age 54 or younger, your body mass index should be between 19-25. Your Body mass index is 35.05 kg/m. If this is out of the aformentioned range listed, please consider follow up with your Primary Care Provider.

## 2016-02-06 NOTE — Telephone Encounter (Signed)
02/06/2016   RE: Hazle QuantWilliam S Furnas DOB: 1960/05/07 MRN: [id number]   Dear Dr. Hyacinth MeekerMiller,    We have scheduled the above patient for an endoscopic procedure. Our records show that he is on anticoagulation therapy.   Please advise as to how long the patient may come off his therapy of Plavix prior to the procedure, which is scheduled for 02/18/16.  Please fax back/ or route the completed form to [employer].   Sincerely,    Deneise Leveresiree Litsy Epting, CMA

## 2016-02-07 NOTE — Telephone Encounter (Signed)
Patient informed and verbalized understanding

## 2016-02-07 NOTE — Telephone Encounter (Signed)
Message forwarded back to [employer] Guerre[employer].

## 2016-02-08 ENCOUNTER — Encounter: Payer: Self-pay | Admitting: Gastroenterology

## 2016-02-12 IMAGING — CT CT SHOULDER*R* W/O CM
3 series · 14 of 29 positions shown, 17 images · non-contrast
Comparison: Radiographs 10/10/2013.

CLINICAL DATA: Evaluate complex right shoulder fracture.

EXAM:
CT OF THE RIGHT SHOULDER WITHOUT CONTRAST
TECHNIQUE: Multidetector CT imaging was performed according to the standard
protocol. Multiplanar CT image reconstructions were also generated.

[Series 3: shoulder axial bone 1.5 recon at 2 · axial · 0.42mm/px · z∈[-200,-126]mm · 3 of 93 slices shown]
[im 19/93  bone]
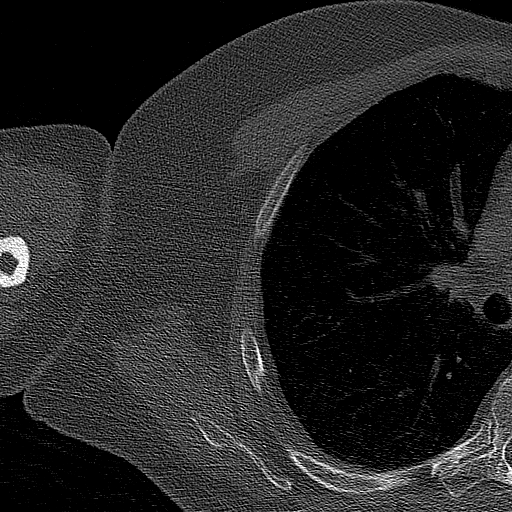
[im 37/93  bone]
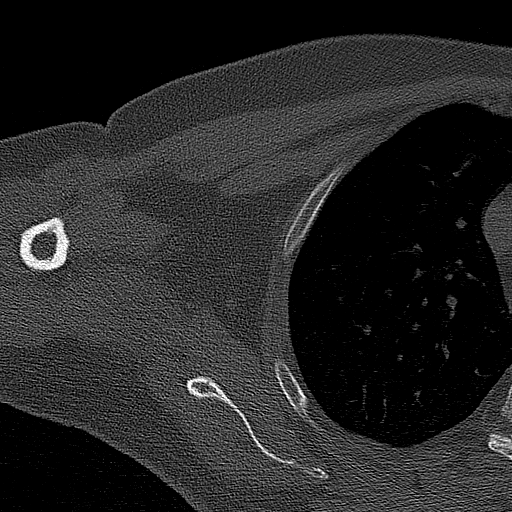
[im 56/93  bone]
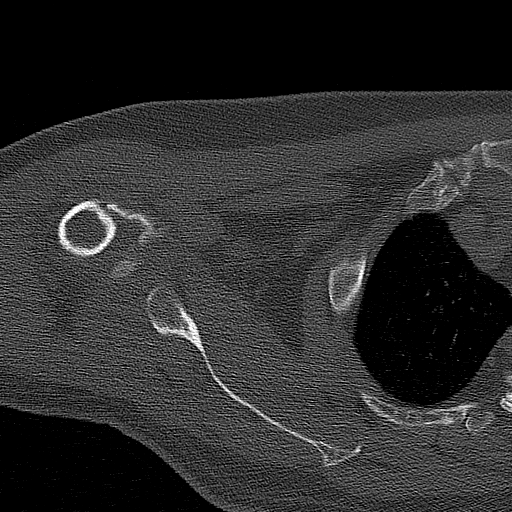

[Series 5: mpr sag bone 2.0 shoulder · sagittal · 0.40mm/px · 5 of 123 slices shown, 6 images]
[im 41/123  bone]
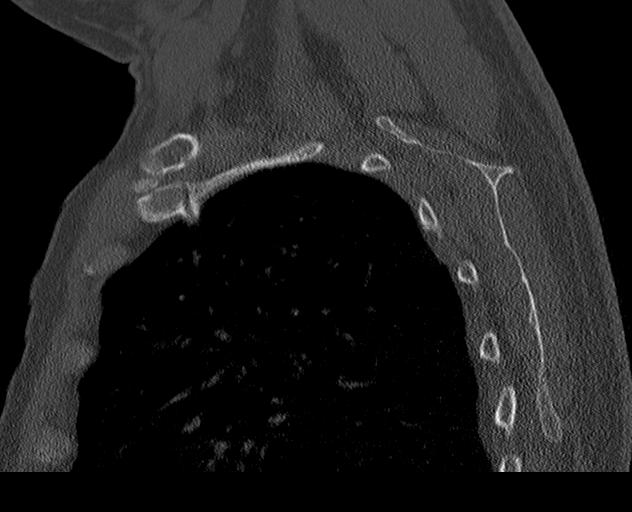
[im 51/123  bone]
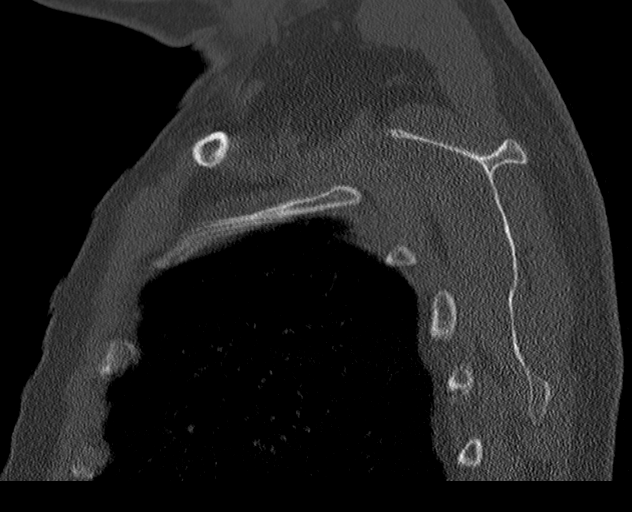
[im 62/123  soft-tissue]
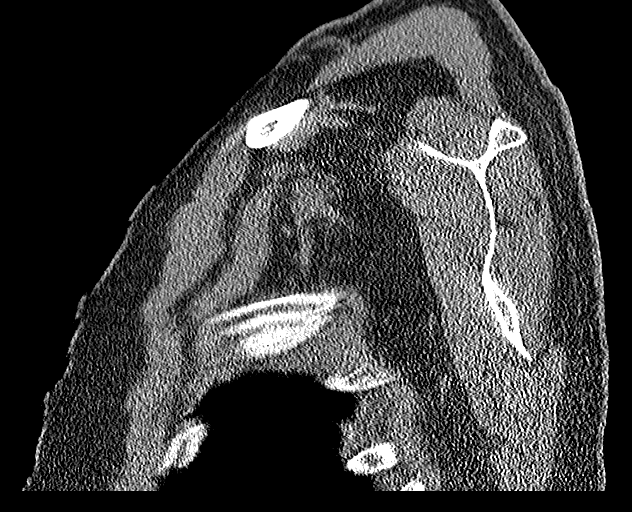
[im 62/123  bone]
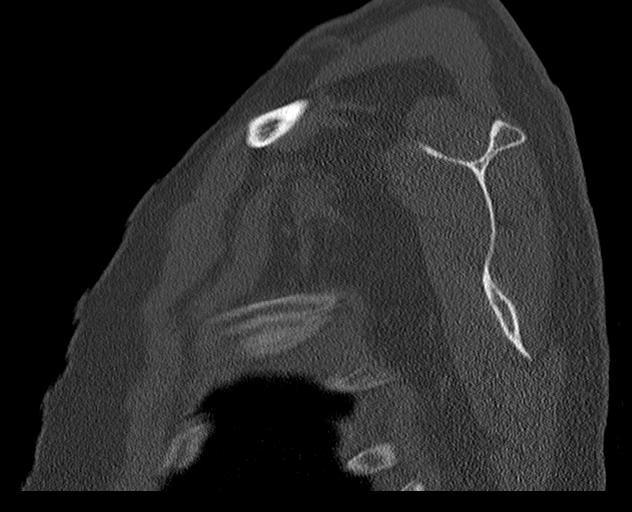
[im 72/123  bone]
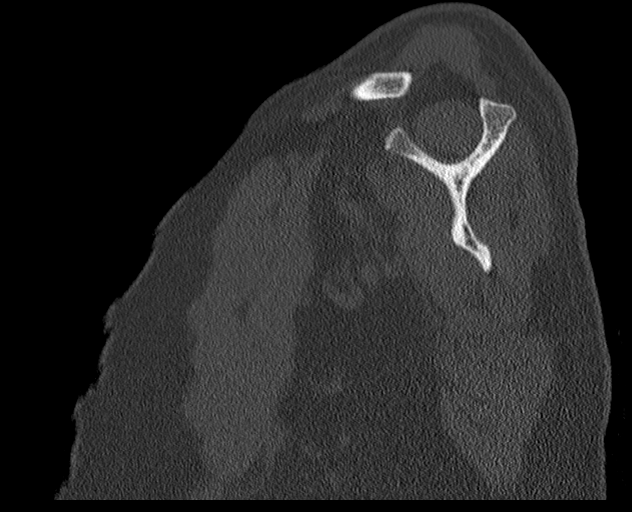
[im 82/123  bone]
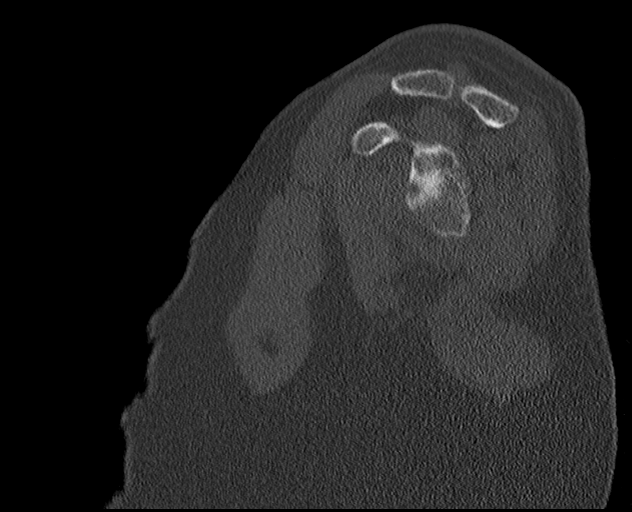

[Series 7: shoulder axial 2.0 recon · axial · 0.43mm/px · z∈[-204,-44]mm · 6 of 113 slices shown, 8 images]
[im 17/113  soft-tissue]
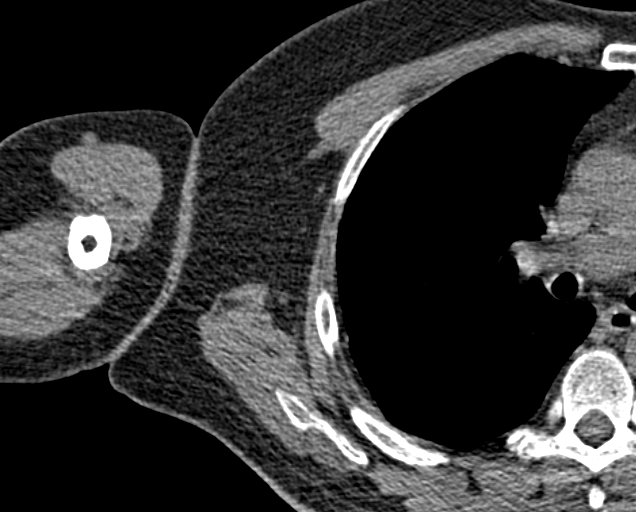
[im 17/113  bone]
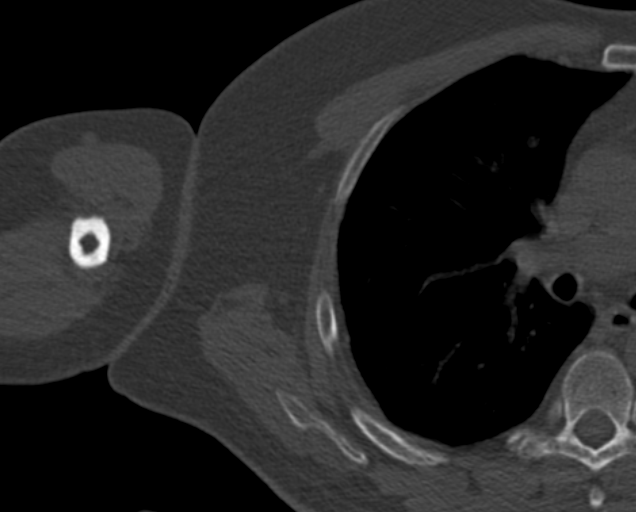
[im 33/113  bone]
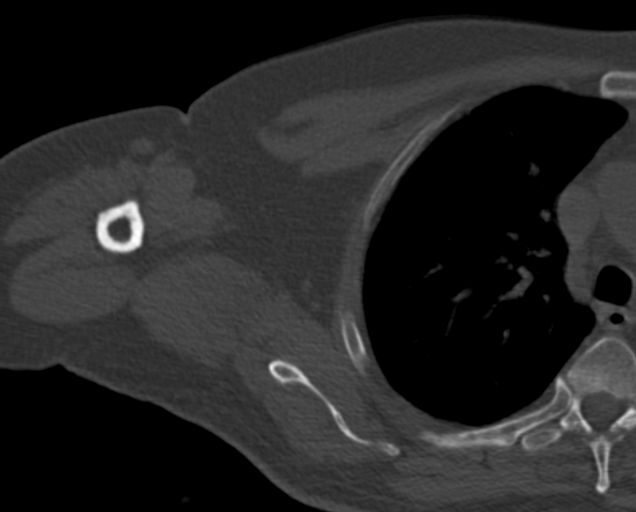
[im 49/113  bone]
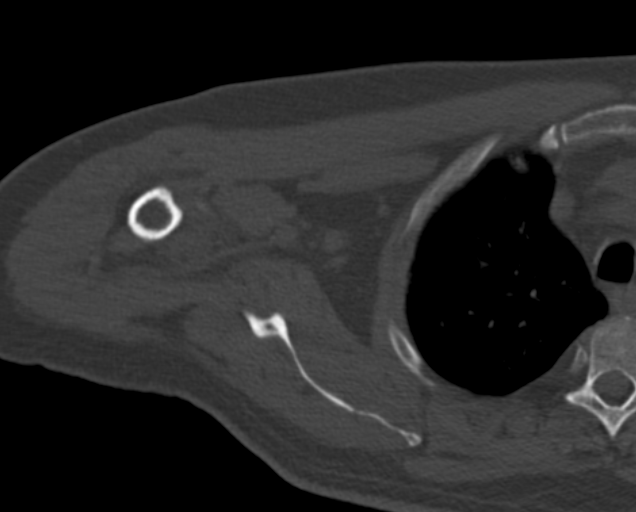
[im 65/113  bone]
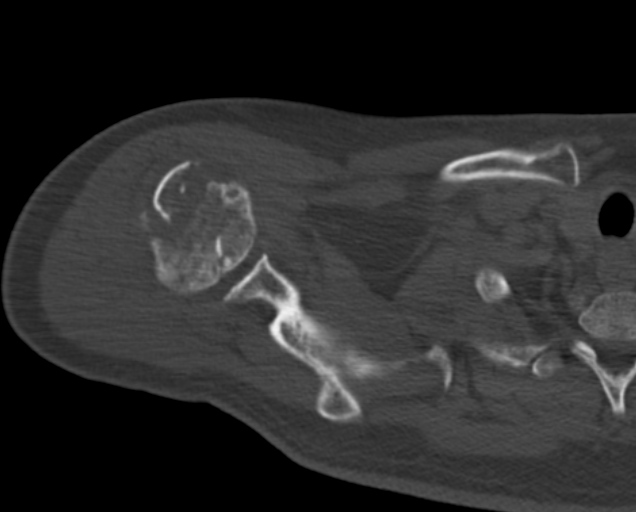
[im 81/113  soft-tissue]
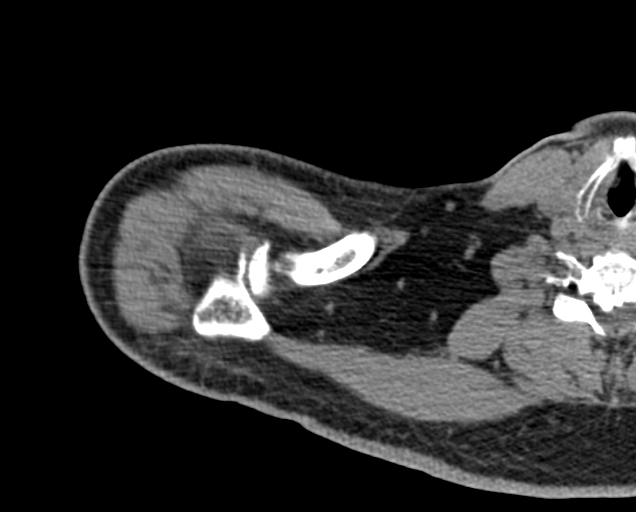
[im 81/113  bone]
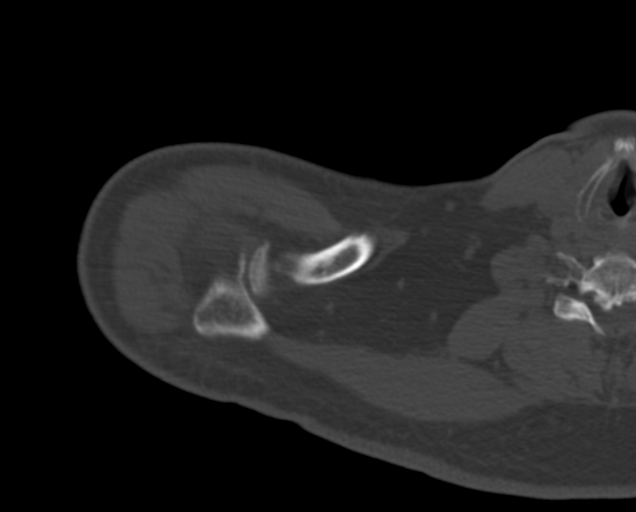
[im 97/113  bone]
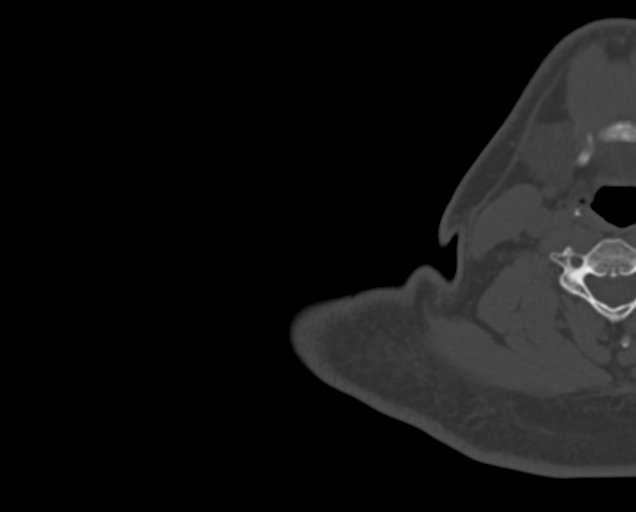

[14 of 29 positions shown; findings below may reference images not displayed]

FINDINGS: Examination is somewhat degraded by patient motion.

There is a complex comminuted fracture involving the humeral head
and neck. There is a horizontal fracture through the humeral neck
with moderate displacement of approximately 10.5 mm. There is a
displaced and comminuted greater tuberosity fracture and also a
longitudinal fracture through the medial humeral head with minimal
displacement estimated at 7 mm. The lesser tuberosity is also
fractured and mildly displaced.

No dislocation.  The glenoid is intact.  The AC joint is intact.

The visualized right lung is clear. No obvious rib fractures. The
scapula is intact.
IMPRESSION: Complex comminuted multipart humeral head and neck fracture.

## 2016-02-18 ENCOUNTER — Ambulatory Visit (AMBULATORY_SURGERY_CENTER): Payer: Medicare Other | Admitting: Gastroenterology

## 2016-02-18 ENCOUNTER — Encounter: Payer: Self-pay | Admitting: Gastroenterology

## 2016-02-18 VITALS — BP 190/88 | HR 55 | Temp 97.1°F | Resp 11 | Ht 65.0 in | Wt 210.0 lb

## 2016-02-18 DIAGNOSIS — Z800 Family history of malignant neoplasm of digestive organs: Secondary | ICD-10-CM

## 2016-02-18 DIAGNOSIS — Z1212 Encounter for screening for malignant neoplasm of rectum: Secondary | ICD-10-CM

## 2016-02-18 DIAGNOSIS — K219 Gastro-esophageal reflux disease without esophagitis: Secondary | ICD-10-CM | POA: Diagnosis not present

## 2016-02-18 DIAGNOSIS — Z86010 Personal history of colonic polyps: Secondary | ICD-10-CM | POA: Diagnosis not present

## 2016-02-18 DIAGNOSIS — I10 Essential (primary) hypertension: Secondary | ICD-10-CM | POA: Diagnosis not present

## 2016-02-18 DIAGNOSIS — J45909 Unspecified asthma, uncomplicated: Secondary | ICD-10-CM | POA: Diagnosis not present

## 2016-02-18 DIAGNOSIS — E119 Type 2 diabetes mellitus without complications: Secondary | ICD-10-CM | POA: Diagnosis not present

## 2016-02-18 DIAGNOSIS — Z1211 Encounter for screening for malignant neoplasm of colon: Secondary | ICD-10-CM | POA: Diagnosis not present

## 2016-02-18 MED ORDER — SODIUM CHLORIDE 0.9 % IV SOLN: 500.0000 mL | INTRAVENOUS | Status: DC

## 2016-02-18 NOTE — Patient Instructions (Signed)
YOU HAD AN ENDOSCOPIC PROCEDURE TODAY AT THE [facility]:   Refer to the procedure report that was given to you for any specific questions about what was found during the examination.  If the procedure report does not answer your questions, please call your gastroenterologist to clarify.  If you requested that your care partner not be given the details of your procedure findings, then the procedure report has been included in a sealed envelope for you to review at your convenience later.  YOU SHOULD EXPECT: Some feelings of bloating in the abdomen. Passage of more gas than usual.  Walking can help get rid of the air that was put into your GI tract during the procedure and reduce the bloating. If you had a lower endoscopy (such as a colonoscopy or flexible sigmoidoscopy) you may notice spotting of blood in your stool or on the toilet paper. If you underwent a bowel prep for your procedure, you may not have a normal bowel movement for a few days.  Please Note:  You might notice some irritation and congestion in your nose or some drainage.  This is from the oxygen used during your procedure.  There is no need for concern and it should clear up in a day or so.  SYMPTOMS TO REPORT IMMEDIATELY:   Following lower endoscopy (colonoscopy or flexible sigmoidoscopy):  Excessive amounts of blood in the stool  Significant tenderness or worsening of abdominal pains  Swelling of the abdomen that is new, acute  Fever of 100F or higher   For urgent or emergent issues, a gastroenterologist can be reached at any hour by calling [phone].   DIET:  We do recommend a small meal at first, but then you may proceed to your regular diet.  Drink plenty of fluids but you should avoid alcoholic beverages for 24 hours.Try to increase the fiber in your diet, and drink plenty of water.  ACTIVITY:  You should plan to take it easy for the rest of today and you should NOT DRIVE or use heavy machinery until  tomorrow (because of the sedation medicines used during the test).    FOLLOW UP: Our staff will call the number listed on your records the next business day following your procedure to check on you and address any questions or concerns that you may have regarding the information given to you following your procedure. If we do not reach you, we will leave a message.  However, if you are feeling well and you are not experiencing any problems, there is no need to return our call.  We will assume that you have returned to your regular daily activities without incident.  If any biopsies were taken you will be contacted by phone or by letter within the next 1-3 weeks.  Please call us at [phone] if you have not heard about the biopsies in 3 weeks.    SIGNATURES/CONFIDENTIALITY: You and/or your care partner have signed paperwork which will be entered into your electronic medical record.  These signatures attest to the fact that that the information above on your After Visit Summary has been reviewed and is understood.  Full responsibility of the confidentiality of this discharge information lies with you and/or your care-partner.  Read all of the handouts given to you by your recovery room nurse, and resume your plavix today. Thank-you for choosing us for your healthcre needs today.

## 2016-02-18 NOTE — Op Note (Signed)
[facility] Patient Name: Bruce Guerrero Procedure Date: 02/18/2016 8:07 AM MRN: [id number] Endoscopist: Sherilyn Cooter L. Myrtie Neither , MD Age: 55 Referring MD:  Date of Birth: May 31, 1960 Gender: Male Account #: [id number] Procedure:                Colonoscopy Indications:              Screening in patient at increased risk: Colorectal                            cancer in sister before age 22, This is the                            patient's first colonoscopy Medicines:                Monitored Anesthesia Care Procedure:                Pre-Anesthesia Assessment:                           - Prior to the procedure, a History and Physical                            was performed, and patient medications and                            allergies were reviewed. The patient's tolerance of                            previous anesthesia was also reviewed. The risks                            and benefits of the procedure and the sedation                            options and risks were discussed with the patient.                            All questions were answered, and informed consent                            was obtained. Prior Anticoagulants: The patient has                            taken Plavix (clopidogrel), last dose was 5 days                            prior to procedure. ASA Grade Assessment: III - A                            patient with severe systemic disease. After                            reviewing the risks and benefits, the patient was  deemed in satisfactory condition to undergo the                            procedure.                           After obtaining informed consent, the colonoscope                            was passed under direct vision. Throughout the                            procedure, the patient's blood pressure, pulse, and                            oxygen saturations were monitored continuously. The           Model CF-HQ190L [id number]) scope was introduced                            through the anus and advanced to the the cecum,                            identified by appendiceal orifice and ileocecal                            valve. The colonoscopy was performed without                            difficulty. The patient tolerated the procedure                            well. The quality of the bowel preparation was                            evaluated using the BBPS [facility] Bowel Preparation                            Scale) with scores of: Right Colon = 3, Transverse                            Colon = 3 and Left Colon = 3 (entire mucosa seen                            well with no residual staining, small fragments of                            stool or opaque liquid). The total BBPS score                            equals 9. The bowel preparation used was SUPREP.                            The ileocecal valve, appendiceal orifice, and  rectum were photographed. Scope In: 8:12:14 AM Scope Out: 8:22:32 AM Scope Withdrawal Time: 0 hours 7 minutes 22 seconds  Total Procedure Duration: 0 hours 10 minutes 18 seconds  Findings:                 The perianal and digital rectal examinations were                            normal.                           Internal hemorrhoids were found during                            retroflexion. The hemorrhoids were medium-sized and                            Grade I (internal hemorrhoids that do not prolapse).                           The exam was otherwise without abnormality on                            direct and retroflexion views. Complications:            No immediate complications. Estimated Blood Loss:     Estimated blood loss: none. Impression:               - Internal hemorrhoids.                           - The examination was otherwise normal on direct                            and retroflexion views.                            - No specimens collected. Recommendation:           - Patient has a contact number available for                            emergencies. The signs and symptoms of potential                            delayed complications were discussed with the                            patient. Return to normal activities tomorrow.                            Written discharge instructions were provided to the                            patient.                           - Resume previous diet.                           -  Continue present medications.                           - Resume Plavix (clopidogrel) at prior dose today.                           - Repeat colonoscopy in 5 years for screening                            purposes. Tidus Upchurch L. Myrtie Neitheranis, MD 02/18/2016 8:30:50 AM This report has been signed electronically.

## 2016-02-18 NOTE — Progress Notes (Signed)
Report to PACU, RN, vss, BBS= Clear.  

## 2016-02-19 ENCOUNTER — Telehealth: Payer: Self-pay | Admitting: *Deleted

## 2016-02-19 NOTE — Telephone Encounter (Signed)
  Follow up Call-  Call back number 02/18/2016  Post procedure Call Back phone  # [phone]  Permission to leave phone message Yes  Some recent data might be hidden     Patient questions:  Do you have a fever, pain , or abdominal swelling? No. Pain Score  0 *  Have you tolerated food without any problems? Yes.    Have you been able to return to your normal activities? Yes.    Do you have any questions about your discharge instructions: Diet   No. Medications  No. Follow up visit  No.  Do you have questions or concerns about your Care? No.  Actions: * If pain score is 4 or above: No action needed, pain <4.

## 2016-02-27 ENCOUNTER — Other Ambulatory Visit: Payer: Self-pay | Admitting: Pediatrics

## 2016-02-27 DIAGNOSIS — I10 Essential (primary) hypertension: Secondary | ICD-10-CM

## 2016-04-02 ENCOUNTER — Other Ambulatory Visit: Payer: Self-pay

## 2016-04-02 DIAGNOSIS — E785 Hyperlipidemia, unspecified: Secondary | ICD-10-CM

## 2016-04-02 MED ORDER — ATORVASTATIN CALCIUM 80 MG PO TABS: ORAL_TABLET | ORAL | 1 refills | Status: DC

## 2016-04-23 ENCOUNTER — Encounter: Payer: Self-pay | Admitting: *Deleted

## 2016-04-25 ENCOUNTER — Other Ambulatory Visit: Payer: Self-pay | Admitting: Family Medicine

## 2016-04-26 ENCOUNTER — Other Ambulatory Visit: Payer: Self-pay | Admitting: Pediatrics

## 2016-05-14 ENCOUNTER — Other Ambulatory Visit: Payer: Self-pay | Admitting: Pediatrics

## 2016-05-25 ENCOUNTER — Other Ambulatory Visit: Payer: Self-pay | Admitting: Family Medicine

## 2016-05-25 DIAGNOSIS — I10 Essential (primary) hypertension: Secondary | ICD-10-CM

## 2016-06-02 ENCOUNTER — Other Ambulatory Visit: Payer: Self-pay | Admitting: *Deleted

## 2016-06-02 DIAGNOSIS — I10 Essential (primary) hypertension: Secondary | ICD-10-CM

## 2016-06-02 MED ORDER — AMLODIPINE BESYLATE 10 MG PO TABS: 10.0000 mg | ORAL_TABLET | Freq: Every day | ORAL | 0 refills | Status: DC

## 2016-07-22 ENCOUNTER — Ambulatory Visit (INDEPENDENT_AMBULATORY_CARE_PROVIDER_SITE_OTHER): Payer: Medicare Other | Admitting: Family Medicine

## 2016-07-22 ENCOUNTER — Ambulatory Visit: Payer: Medicare Other | Admitting: Family Medicine

## 2016-07-22 ENCOUNTER — Encounter: Payer: Self-pay | Admitting: Family Medicine

## 2016-07-22 VITALS — BP 122/71 | HR 53 | Temp 97.0°F | Ht 65.0 in | Wt 202.8 lb

## 2016-07-22 DIAGNOSIS — I10 Essential (primary) hypertension: Secondary | ICD-10-CM

## 2016-07-22 DIAGNOSIS — R7303 Prediabetes: Secondary | ICD-10-CM

## 2016-07-22 DIAGNOSIS — E785 Hyperlipidemia, unspecified: Secondary | ICD-10-CM | POA: Diagnosis not present

## 2016-07-22 DIAGNOSIS — M65342 Trigger finger, left ring finger: Secondary | ICD-10-CM

## 2016-07-22 DIAGNOSIS — M1A0420 Idiopathic chronic gout, left hand, without tophus (tophi): Secondary | ICD-10-CM

## 2016-07-22 DIAGNOSIS — L609 Nail disorder, unspecified: Secondary | ICD-10-CM

## 2016-07-22 LAB — BAYER DCA HB A1C WAIVED: HB A1C: 5.5 % (ref <7.0)

## 2016-07-22 MED ORDER — ALLOPURINOL 300 MG PO TABS
ORAL_TABLET | ORAL | 3 refills | Status: DC
Start: 2016-07-22 — End: 2017-07-04

## 2016-07-22 MED ORDER — AMLODIPINE BESYLATE 10 MG PO TABS: 10.0000 mg | ORAL_TABLET | Freq: Every day | ORAL | 3 refills | Status: DC

## 2016-07-22 MED ORDER — SERTRALINE HCL 50 MG PO TABS: 50.0000 mg | ORAL_TABLET | Freq: Every day | ORAL | 3 refills | Status: DC

## 2016-07-22 MED ORDER — COLCHICINE 0.6 MG PO TABS: 0.6000 mg | ORAL_TABLET | Freq: Every day | ORAL | 3 refills | Status: DC

## 2016-07-22 NOTE — Progress Notes (Signed)
   HPI  Patient presents today here for follow-up chronic medical conditions.  Hypertension Good medication compliance, no chest pain or dyspnea.  Hyperlipidemia, prediabetes Aware of diet, watching more than he used to. He is walking regularly, approximately 30 minutes 4 times weekly.  Trigger finger 6-8 weeks of slight catch in the left PIP, this is bothersome and he is concerned about it because he has hemiplegic on the right side. He would like to keep his left hand as healthy as possible.  Gout No flares for quite some time, daily colchicine  PMH: Smoking status noted ROS: Per HPI  Objective: BP 122/71   Pulse (!) 53   Temp 97 F (36.1 C) (Oral)   Ht '5\' 5"'$  (1.651 m)   Wt 202 lb 12.8 oz (92 kg)   BMI 33.75 kg/m  Gen: NAD, alert, cooperative with exam HEENT: NCAT CV: RRR, good S1/S2, no murmur Resp: CTABL, no wheezes, non-labored Abd: SNTND, BS present, no guarding or organomegaly Ext: No edema, warm Neuro: Alert and oriented, No gross deficits  MSK Slight catch at the PIP of the left fourth finger. Nails on dominant (hemiplegic) hand with peeling, flaking, and thickening sparing the middle finger/ .  Assessment and plan:  # Hyperlipidemia Continue Lipitor, clinically stable Discussed therapeutic lifestyle changes Labs fasting  # Prediabetes Clinically stable, her discussed therapy last all changes A1c  # Hypertension Well controlled, no changes to current regimen Labs  # Trigger finger Unusual sure agreeing in the PIP, considering that this is on his functional hand, he has right-sided hemiplegia, I have decided to refer him to hand surgery for their opinion.  Nail abnormalities Patient with unusual changes on the right fingernails except for the third finger. Unclear etiology, however likely related to hemiplegia.  Chronic gout Uric acid elevated at 8 point several last year, repeat Consider titrating until uric acid less than 6, then consider  discontinuing culture seen which was discussed at length today.     Orders Placed This Encounter  Procedures  . Uric acid  . CBC with Differential/Platelet  . CMP14+EGFR  . Lipid panel  . Bayer DCA Hb A1c Waived  . Ambulatory referral to Orthopedic Surgery    Referral Priority:   Routine    Referral Type:   Surgical    Referral Reason:   Specialty Services Required    Requested Specialty:   Orthopedic Surgery    Number of Visits Requested:   1    Meds ordered this encounter  Medications  . allopurinol (ZYLOPRIM) 300 MG tablet    Sig: TAKE 1 TABLET (300 MG TOTAL) BY MOUTH DAILY.    Dispense:  90 tablet    Refill:  3  . amLODipine (NORVASC) 10 MG tablet    Sig: Take 1 tablet (10 mg total) by mouth daily.    Dispense:  90 tablet    Refill:  3  . colchicine (COLCRYS) 0.6 MG tablet    Sig: Take 1 tablet (0.6 mg total) by mouth daily.    Dispense:  90 tablet    Refill:  3    90 DAY SUPPLY  . sertraline (ZOLOFT) 50 MG tablet    Sig: Take 1 tablet (50 mg total) by mouth daily.    Dispense:  90 tablet    Refill:  Harrison, MD [place] 07/22/2016, 9:57 AM

## 2016-07-22 NOTE — Patient Instructions (Signed)
Great to meet you!  Come back in 6 month sunless you need us sooner.   We will work on a referral [facility]

## 2016-07-23 LAB — CMP14+EGFR
ALT: 38 IU/L (ref 0–44)
AST: 23 IU/L (ref 0–40)
Albumin/Globulin Ratio: 2 (ref 1.2–2.2)
Albumin: 4.5 g/dL (ref 3.5–5.5)
Alkaline Phosphatase: 113 IU/L (ref 39–117)
BUN/Creatinine Ratio: 21 — ABNORMAL HIGH (ref 9–20)
BUN: 23 mg/dL (ref 6–24)
Bilirubin Total: 0.6 mg/dL (ref 0.0–1.2)
CALCIUM: 9.6 mg/dL (ref 8.7–10.2)
CO2: 24 mmol/L (ref 18–29)
CREATININE: 1.09 mg/dL (ref 0.76–1.27)
Chloride: 99 mmol/L (ref 96–106)
GFR calc non Af Amer: 76 mL/min/{1.73_m2} (ref >59)
GFR, EST AFRICAN AMERICAN: 88 mL/min/{1.73_m2} (ref >59)
Globulin, Total: 2.2 g/dL (ref 1.5–4.5)
Glucose: 99 mg/dL (ref 65–99)
Potassium: 4.2 mmol/L (ref 3.5–5.2)
Sodium: 139 mmol/L (ref 134–144)
TOTAL PROTEIN: 6.7 g/dL (ref 6.0–8.5)

## 2016-07-23 LAB — LIPID PANEL
Chol/HDL Ratio: 3.7 ratio (ref 0.0–5.0)
Cholesterol, Total: 95 mg/dL — ABNORMAL LOW (ref 100–199)
HDL: 26 mg/dL — AB (ref >39)
LDL Calculated: 35 mg/dL (ref 0–99)
Triglycerides: 171 mg/dL — ABNORMAL HIGH (ref 0–149)
VLDL CHOLESTEROL CAL: 34 mg/dL (ref 5–40)

## 2016-07-23 LAB — URIC ACID: Uric Acid: 5.6 mg/dL (ref 3.7–8.6)

## 2016-07-23 LAB — CBC WITH DIFFERENTIAL/PLATELET
BASOS ABS: 0 10*3/uL (ref 0.0–0.2)
BASOS: 0 %
EOS (ABSOLUTE): 0.1 10*3/uL (ref 0.0–0.4)
Eos: 2 %
Hematocrit: 47.1 % (ref 37.5–51.0)
Hemoglobin: 16.2 g/dL (ref 13.0–17.7)
IMMATURE GRANS (ABS): 0 10*3/uL (ref 0.0–0.1)
IMMATURE GRANULOCYTES: 0 %
LYMPHS: 32 %
Lymphocytes Absolute: 2.2 10*3/uL (ref 0.7–3.1)
MCH: 33.3 pg — ABNORMAL HIGH (ref 26.6–33.0)
MCHC: 34.4 g/dL (ref 31.5–35.7)
MCV: 97 fL (ref 79–97)
Monocytes Absolute: 0.6 10*3/uL (ref 0.1–0.9)
Monocytes: 8 %
NEUTROS PCT: 58 %
Neutrophils Absolute: 4 10*3/uL (ref 1.4–7.0)
PLATELETS: 167 10*3/uL (ref 150–379)
RBC: 4.86 x10E6/uL (ref 4.14–5.80)
RDW: 14 % (ref 12.3–15.4)
WBC: 7 10*3/uL (ref 3.4–10.8)

## 2016-09-17 DIAGNOSIS — M79641 Pain in right hand: Secondary | ICD-10-CM | POA: Diagnosis not present

## 2016-09-17 DIAGNOSIS — M65342 Trigger finger, left ring finger: Secondary | ICD-10-CM | POA: Diagnosis not present

## 2016-09-19 ENCOUNTER — Ambulatory Visit (INDEPENDENT_AMBULATORY_CARE_PROVIDER_SITE_OTHER): Payer: Medicare Other | Admitting: Family Medicine

## 2016-09-19 ENCOUNTER — Encounter: Payer: Self-pay | Admitting: Family Medicine

## 2016-09-19 VITALS — BP 111/61 | HR 64 | Temp 97.4°F | Ht 65.0 in | Wt 201.0 lb

## 2016-09-19 DIAGNOSIS — M546 Pain in thoracic spine: Secondary | ICD-10-CM

## 2016-09-19 MED ORDER — CYCLOBENZAPRINE HCL 10 MG PO TABS: 10.0000 mg | ORAL_TABLET | Freq: Three times a day (TID) | ORAL | 0 refills | Status: DC | PRN

## 2016-09-19 MED ORDER — PREDNISONE 20 MG PO TABS: 40.0000 mg | ORAL_TABLET | Freq: Every day | ORAL | 0 refills | Status: DC

## 2016-09-19 NOTE — Patient Instructions (Signed)
Great to see you!  Cyclobenzaprine will likely cause sleepiness so be cautious about taking it.    Back Pain, Adult Back pain is very common. The pain often gets better over time. The cause of back pain is usually not dangerous. Most people can learn to manage their back pain on their own. Follow these instructions at home: Watch your back pain for any changes. The following actions may help to lessen any pain you are feeling:  Stay active. Start with short walks on flat ground if you can. Try to walk farther each day.  Exercise regularly as told by your doctor. Exercise helps your back heal faster. It also helps avoid future injury by keeping your muscles strong and flexible.  Do not sit, drive, or stand in one place for more than 30 minutes.  Do not stay in bed. Resting more than 1-2 days can slow down your recovery.  Be careful when you bend or lift an object. Use good form when lifting: ? Bend at your knees. ? Keep the object close to your body. ? Do not twist.  Sleep on a firm mattress. Lie on your side, and bend your knees. If you lie on your back, put a pillow under your knees.  Take medicines only as told by your doctor.  Put ice on the injured area. ? Put ice in a plastic bag. ? Place a towel between your skin and the bag. ? Leave the ice on for 20 minutes, 2-3 times a day for the first 2-3 days. After that, you can switch between ice and heat packs.  Avoid feeling anxious or stressed. Find good ways to deal with stress, such as exercise.  Maintain a healthy weight. Extra weight puts stress on your back.  Contact a doctor if:  You have pain that does not go away with rest or medicine.  You have worsening pain that goes down into your legs or buttocks.  You have pain that does not get better in one week.  You have pain at night.  You lose weight.  You have a fever or chills. Get help right away if:  You cannot control when you poop (bowel movement) or pee  (urinate).  Your arms or legs feel weak.  Your arms or legs lose feeling (numbness).  You feel sick to your stomach (nauseous) or throw up (vomit).  You have belly (abdominal) pain.  You feel like you may pass out (faint). This information is not intended to replace advice given to you by your health care provider. Make sure you discuss any questions you have with your health care provider. Document Released: 08/13/2007 Document Revised: 08/02/2015 Document Reviewed: 06/28/2013 Elsevier Interactive Patient Education  [employer]2018 [employer] Inc[employer]

## 2016-09-19 NOTE — Progress Notes (Signed)
   HPI  Patient presents today here with back pain.  Patient describes right-sided thoracic back pain located just below the right scapula. Patient states that it started last weekend and her for 2 days after sitting in the hospital for several hours and a poorly fitting chair. He denies any discrete injury.  He states that he improved and then jumped in his sleep causing recurrence of the symptoms over the last 2 days. This morning he tried to push no his mother's lawn and caused severe worsening of the pain.  He denies shortness of breath, fever, chills, sweats.  PMH: Smoking status noted ROS: Per HPI  Objective: BP 111/61   Pulse 64   Temp (!) 97.4 F (36.3 C) (Oral)   Ht 5\' 5"  (1.651 m)   Wt 201 lb (91.2 kg)   BMI 33.45 kg/m  Gen: NAD, alert, cooperative with exam HEENT: NCAT, EOMI, PERRL CV: RRR, good S1/S2, no murmur Resp: CTABL, no wheezes, non-labored Ext: No edema, warm Neuro: Alert and oriented, No gross deficits MSK:  Tenderness to palpation of the right sided upper or spinal muscles just under the right scapula. No midline tenderness He has tenderness with rotating her shoulders also with deep inspiration, however he has deep inspiration without great effort or grimace.  Assessment and plan:  # Right-sided thoracic back pain Likely muscle spasm Prednisone plus Flexeril Return to clinic if not improving as expected.  Considered pleuritic pain, however he has no other signs of pulmonary issue, given pain with rotating the shoulders and palpation is most likely simple muscular issue   Meds ordered this encounter  Medications  . cyclobenzaprine (FLEXERIL) 10 MG tablet    Sig: Take 1 tablet (10 mg total) by mouth 3 (three) times daily as needed for muscle spasms.    Dispense:  30 tablet    Refill:  0  . predniSONE (DELTASONE) 20 MG tablet    Sig: Take 2 tablets (40 mg total) by mouth daily with breakfast.    Dispense:  10 tablet    Refill:  0    Murtis SinkSam  Marializ Ferrebee, MD [facility] 09/19/2016, 2:38 PM

## 2016-09-22 ENCOUNTER — Other Ambulatory Visit: Payer: Self-pay | Admitting: Family Medicine

## 2016-10-13 DIAGNOSIS — M79642 Pain in left hand: Secondary | ICD-10-CM | POA: Diagnosis not present

## 2016-10-13 DIAGNOSIS — M65342 Trigger finger, left ring finger: Secondary | ICD-10-CM | POA: Diagnosis not present

## 2016-10-17 ENCOUNTER — Other Ambulatory Visit: Payer: Self-pay | Admitting: Family Medicine

## 2016-11-04 ENCOUNTER — Other Ambulatory Visit: Payer: Self-pay | Admitting: Pediatrics

## 2016-11-04 DIAGNOSIS — E785 Hyperlipidemia, unspecified: Secondary | ICD-10-CM

## 2016-12-22 ENCOUNTER — Ambulatory Visit (INDEPENDENT_AMBULATORY_CARE_PROVIDER_SITE_OTHER): Payer: Medicare Other

## 2016-12-22 DIAGNOSIS — Z23 Encounter for immunization: Secondary | ICD-10-CM

## 2016-12-23 ENCOUNTER — Other Ambulatory Visit: Payer: Self-pay | Admitting: Family Medicine

## 2017-01-23 ENCOUNTER — Ambulatory Visit: Payer: Medicare Other | Admitting: Pediatrics

## 2017-01-23 ENCOUNTER — Ambulatory Visit: Payer: Medicare Other | Admitting: Family Medicine

## 2017-01-23 ENCOUNTER — Encounter: Payer: Self-pay | Admitting: Family Medicine

## 2017-01-23 VITALS — BP 123/64 | HR 62 | Temp 97.1°F | Ht 65.0 in | Wt 203.4 lb

## 2017-01-23 DIAGNOSIS — Z87898 Personal history of other specified conditions: Secondary | ICD-10-CM

## 2017-01-23 DIAGNOSIS — M25512 Pain in left shoulder: Secondary | ICD-10-CM

## 2017-01-23 DIAGNOSIS — M1A0420 Idiopathic chronic gout, left hand, without tophus (tophi): Secondary | ICD-10-CM

## 2017-01-23 DIAGNOSIS — E1169 Type 2 diabetes mellitus with other specified complication: Secondary | ICD-10-CM | POA: Insufficient documentation

## 2017-01-23 DIAGNOSIS — R7303 Prediabetes: Secondary | ICD-10-CM | POA: Insufficient documentation

## 2017-01-23 DIAGNOSIS — E785 Hyperlipidemia, unspecified: Secondary | ICD-10-CM | POA: Diagnosis not present

## 2017-01-23 LAB — BAYER DCA HB A1C WAIVED: HB A1C: 5.9 % (ref <7.0)

## 2017-01-23 MED ORDER — ATORVASTATIN CALCIUM 80 MG PO TABS: ORAL_TABLET | ORAL | 3 refills | Status: DC

## 2017-01-23 NOTE — Patient Instructions (Signed)
Great to see you!  Feel free to try to stop colchicine, according to the labs you should be clear without any symptoms of gout.   Come back in 6 months unless you need us sooner.

## 2017-01-23 NOTE — Progress Notes (Signed)
   HPI  Patient presents today here to follow-up for chronic medical conditions.  Left shoulder pain Patient explains that about 3 months ago he had acute left shoulder pain.  It has gradually been improving since that time.  At first he had difficulty driving.  Now it only hurts when he sleeps at certain positions. No injury.  Gout Previously of bilateral feet issues, now improved on colchicine plus allopurinol, will try a trial off of colchicine.  Hyperlipidemia Needs refill, good medication compliance, history of stroke.  Prediabetes Watching diet.   PMH: Smoking status noted ROS: Per HPI  Objective: BP 123/64   Pulse 62   Temp (!) 97.1 F (36.2 C) (Oral)   Ht '5\' 5"'$  (1.651 m)   Wt 203 lb 6.4 oz (92.3 kg)   BMI 33.85 kg/m  Gen: NAD, alert, cooperative with exam HEENT: NCAT, EOMI, PERRL CV: RRR, good S1/S2, no murmur Resp: CTABL, no wheezes, non-labored Ext: No edema, warm Neuro: Alert and oriented, No gross deficits  Assessment and plan:  #Hyperlipidemia Labs up-to-date, well controlled Continue statin, refilled  #Left shoulder pain Mild signs of rotator cuff syndrome, however strength is well-preserved, possibly AC joint arthritis For now watchful waiting  #History of prediabetes Controlled last visit Repeat labs  #Gout Discontinue colchicine, keep as needed.  Uric acid was at goal last visit    Orders Placed This Encounter  Procedures  . CBC with Differential/Platelet  . CMP14+EGFR  . Bayer DCA Hb A1c Waived    Meds ordered this encounter  Medications  . atorvastatin (LIPITOR) 80 MG tablet    Sig: TAKE 1 TABLET BY MOUTH EVERY DAY AT 6PM    Dispense:  90 tablet    Refill:  Rudolph, MD [place] 01/23/2017, 8:14 AM

## 2017-01-24 LAB — CMP14+EGFR
ALT: 39 IU/L (ref 0–44)
AST: 20 IU/L (ref 0–40)
Albumin/Globulin Ratio: 2.1 (ref 1.2–2.2)
Albumin: 4.6 g/dL (ref 3.5–5.5)
Alkaline Phosphatase: 95 IU/L (ref 39–117)
BUN/Creatinine Ratio: 11 (ref 9–20)
BUN: 14 mg/dL (ref 6–24)
Bilirubin Total: 0.6 mg/dL (ref 0.0–1.2)
CO2: 24 mmol/L (ref 20–29)
Calcium: 9.6 mg/dL (ref 8.7–10.2)
Chloride: 98 mmol/L (ref 96–106)
Creatinine, Ser: 1.24 mg/dL (ref 0.76–1.27)
GFR calc Af Amer: 75 mL/min/{1.73_m2} (ref >59)
GFR calc non Af Amer: 65 mL/min/{1.73_m2} (ref >59)
Globulin, Total: 2.2 g/dL (ref 1.5–4.5)
Glucose: 115 mg/dL — ABNORMAL HIGH (ref 65–99)
Potassium: 4.6 mmol/L (ref 3.5–5.2)
Sodium: 140 mmol/L (ref 134–144)
Total Protein: 6.8 g/dL (ref 6.0–8.5)

## 2017-01-24 LAB — CBC WITH DIFFERENTIAL/PLATELET
Basophils Absolute: 0 10*3/uL (ref 0.0–0.2)
Basos: 0 %
EOS (ABSOLUTE): 0.2 10*3/uL (ref 0.0–0.4)
Eos: 2 %
Hematocrit: 49.9 % (ref 37.5–51.0)
Hemoglobin: 16.8 g/dL (ref 13.0–17.7)
Immature Grans (Abs): 0 10*3/uL (ref 0.0–0.1)
Immature Granulocytes: 0 %
Lymphocytes Absolute: 2.2 10*3/uL (ref 0.7–3.1)
Lymphs: 30 %
MCH: 33 pg (ref 26.6–33.0)
MCHC: 33.7 g/dL (ref 31.5–35.7)
MCV: 98 fL — ABNORMAL HIGH (ref 79–97)
Monocytes Absolute: 0.4 10*3/uL (ref 0.1–0.9)
Monocytes: 6 %
Neutrophils Absolute: 4.4 10*3/uL (ref 1.4–7.0)
Neutrophils: 62 %
Platelets: 173 10*3/uL (ref 150–379)
RBC: 5.09 x10E6/uL (ref 4.14–5.80)
RDW: 14.1 % (ref 12.3–15.4)
WBC: 7.2 10*3/uL (ref 3.4–10.8)

## 2017-03-29 ENCOUNTER — Other Ambulatory Visit: Payer: Self-pay | Admitting: Family Medicine

## 2017-04-20 ENCOUNTER — Other Ambulatory Visit: Payer: Self-pay | Admitting: Family Medicine

## 2017-06-27 ENCOUNTER — Other Ambulatory Visit: Payer: Self-pay | Admitting: Family Medicine

## 2017-06-29 NOTE — Telephone Encounter (Signed)
OV 07/2017

## 2017-07-04 ENCOUNTER — Other Ambulatory Visit: Payer: Self-pay | Admitting: Family Medicine

## 2017-07-16 ENCOUNTER — Other Ambulatory Visit: Payer: Self-pay | Admitting: Family Medicine

## 2017-07-16 NOTE — Telephone Encounter (Signed)
OV 07/2017

## 2017-07-23 ENCOUNTER — Ambulatory Visit: Payer: Medicare Other | Admitting: Family Medicine

## 2017-07-27 ENCOUNTER — Ambulatory Visit: Payer: Medicare Other | Admitting: Family Medicine

## 2017-07-27 ENCOUNTER — Encounter: Payer: Self-pay | Admitting: Family Medicine

## 2017-07-27 VITALS — BP 134/74 | HR 53 | Temp 97.1°F | Ht 65.0 in | Wt 199.2 lb

## 2017-07-27 DIAGNOSIS — M1A49X0 Other secondary chronic gout, multiple sites, without tophus (tophi): Secondary | ICD-10-CM

## 2017-07-27 DIAGNOSIS — Z87898 Personal history of other specified conditions: Secondary | ICD-10-CM | POA: Diagnosis not present

## 2017-07-27 DIAGNOSIS — I10 Essential (primary) hypertension: Secondary | ICD-10-CM

## 2017-07-27 LAB — BAYER DCA HB A1C WAIVED: HB A1C (BAYER DCA - WAIVED): 5.9 % (ref <7.0)

## 2017-07-27 MED ORDER — SERTRALINE HCL 50 MG PO TABS: 50.0000 mg | ORAL_TABLET | Freq: Every day | ORAL | 3 refills | Status: DC

## 2017-07-27 MED ORDER — AMLODIPINE BESYLATE 10 MG PO TABS: 10.0000 mg | ORAL_TABLET | Freq: Every day | ORAL | 3 refills | Status: DC

## 2017-07-27 MED ORDER — CLOPIDOGREL BISULFATE 75 MG PO TABS: 75.0000 mg | ORAL_TABLET | Freq: Every day | ORAL | 3 refills | Status: DC

## 2017-07-27 MED ORDER — ALLOPURINOL 300 MG PO TABS: ORAL_TABLET | ORAL | 3 refills | Status: DC

## 2017-07-27 MED ORDER — COLCHICINE 0.6 MG PO TABS: 0.6000 mg | ORAL_TABLET | Freq: Every day | ORAL | 3 refills | Status: DC

## 2017-07-27 MED ORDER — CARVEDILOL 6.25 MG PO TABS: 6.2500 mg | ORAL_TABLET | Freq: Two times a day (BID) | ORAL | 3 refills | Status: DC

## 2017-07-27 NOTE — Patient Instructions (Signed)
Great to see you!  Come see Dr. Louanne Skye or Dr. Nadine Counts in 6 months

## 2017-07-27 NOTE — Progress Notes (Signed)
   HPI  Patient presents today for follow-up chronic medical conditions.  Gout Controlled well with allopurinol plus daily colchicine. No recent flare.  Hypertension Needs refills, good medication compliance No headache or chest pain.  History of prediabetes Watching diet moderately.  PMH: Smoking status noted ROS: Per HPI  Objective: BP 134/74   Pulse (!) 53   Temp (!) 97.1 F (36.2 C) (Oral)   Ht '5\' 5"'$  (1.651 m)   Wt 199 lb 3.2 oz (90.4 kg)   BMI 33.15 kg/m  Gen: NAD, alert, cooperative with exam HEENT: NCAT, EOMI, PERRL CV: RRR, good S1/S2, no murmur Resp: CTABL, no wheezes, non-labored Abd: SNTND, BS present, no guarding or organomegaly Ext: No edema, warm Neuro: Alert and oriented, No gross deficits  Assessment and plan:  #Hypertension Well-controlled No changes, continue amlodipine, Coreg. Labs  #Gout Patient states gout symptoms are well controlled with allopurinol and taking daily colchicine Daily colchicine is not ideal, however he does have severe symptoms. Labs today   #History of prediabetes A1c Watching diet moderately, clinically doing well    Orders Placed This Encounter  Procedures  . CMP14+EGFR  . CBC with Differential/Platelet  . Bayer DCA Hb A1c Waived  . Uric acid    Meds ordered this encounter  Medications  . amLODipine (NORVASC) 10 MG tablet    Sig: Take 1 tablet (10 mg total) by mouth daily.    Dispense:  90 tablet    Refill:  3  . allopurinol (ZYLOPRIM) 300 MG tablet    Sig: TAKE 1 TABLET BY MOUTH EVERY DAY    Dispense:  90 tablet    Refill:  3  . carvedilol (COREG) 6.25 MG tablet    Sig: Take 1 tablet (6.25 mg total) by mouth 2 (two) times daily with a meal.    Dispense:  180 tablet    Refill:  3  . clopidogrel (PLAVIX) 75 MG tablet    Sig: Take 1 tablet (75 mg total) by mouth daily.    Dispense:  90 tablet    Refill:  3  . colchicine (COLCRYS) 0.6 MG tablet    Sig: Take 1 tablet (0.6 mg total) by mouth daily.      Dispense:  90 tablet    Refill:  3    90 DAY SUPPLY  . sertraline (ZOLOFT) 50 MG tablet    Sig: Take 1 tablet (50 mg total) by mouth daily.    Dispense:  90 tablet    Refill:  Baldwin, MD [place] 07/27/2017, 9:53 AM

## 2017-07-28 LAB — CBC WITH DIFFERENTIAL/PLATELET
BASOS: 0 %
Basophils Absolute: 0 10*3/uL (ref 0.0–0.2)
EOS (ABSOLUTE): 0.1 10*3/uL (ref 0.0–0.4)
EOS: 2 %
HEMATOCRIT: 46 % (ref 37.5–51.0)
Hemoglobin: 15.7 g/dL (ref 13.0–17.7)
IMMATURE GRANULOCYTES: 0 %
Immature Grans (Abs): 0 10*3/uL (ref 0.0–0.1)
LYMPHS: 31 %
Lymphocytes Absolute: 1.9 10*3/uL (ref 0.7–3.1)
MCH: 32.6 pg (ref 26.6–33.0)
MCHC: 34.1 g/dL (ref 31.5–35.7)
MCV: 95 fL (ref 79–97)
Monocytes Absolute: 0.6 10*3/uL (ref 0.1–0.9)
Monocytes: 9 %
NEUTROS PCT: 58 %
Neutrophils Absolute: 3.6 10*3/uL (ref 1.4–7.0)
PLATELETS: 149 10*3/uL — AB (ref 150–450)
RBC: 4.82 x10E6/uL (ref 4.14–5.80)
RDW: 13.7 % (ref 12.3–15.4)
WBC: 6.2 10*3/uL (ref 3.4–10.8)

## 2017-07-28 LAB — CMP14+EGFR
ALT: 37 IU/L (ref 0–44)
AST: 19 IU/L (ref 0–40)
Albumin/Globulin Ratio: 2.2 (ref 1.2–2.2)
Albumin: 4.3 g/dL (ref 3.5–5.5)
Alkaline Phosphatase: 94 IU/L (ref 39–117)
BUN/Creatinine Ratio: 15 (ref 9–20)
BUN: 19 mg/dL (ref 6–24)
Bilirubin Total: 0.9 mg/dL (ref 0.0–1.2)
CALCIUM: 9.3 mg/dL (ref 8.7–10.2)
CO2: 21 mmol/L (ref 20–29)
CREATININE: 1.24 mg/dL (ref 0.76–1.27)
Chloride: 102 mmol/L (ref 96–106)
GFR calc Af Amer: 75 mL/min/{1.73_m2} (ref >59)
GFR, EST NON AFRICAN AMERICAN: 65 mL/min/{1.73_m2} (ref >59)
Globulin, Total: 2 g/dL (ref 1.5–4.5)
Glucose: 131 mg/dL — ABNORMAL HIGH (ref 65–99)
Potassium: 3.8 mmol/L (ref 3.5–5.2)
Sodium: 140 mmol/L (ref 134–144)
Total Protein: 6.3 g/dL (ref 6.0–8.5)

## 2017-07-28 LAB — URIC ACID: URIC ACID: 6.1 mg/dL (ref 3.7–8.6)

## 2017-12-23 ENCOUNTER — Ambulatory Visit (INDEPENDENT_AMBULATORY_CARE_PROVIDER_SITE_OTHER): Payer: Medicare Other

## 2017-12-23 DIAGNOSIS — Z23 Encounter for immunization: Secondary | ICD-10-CM | POA: Diagnosis not present

## 2018-01-22 ENCOUNTER — Other Ambulatory Visit: Payer: Self-pay | Admitting: *Deleted

## 2018-01-22 DIAGNOSIS — E785 Hyperlipidemia, unspecified: Secondary | ICD-10-CM

## 2018-01-22 MED ORDER — ATORVASTATIN CALCIUM 80 MG PO TABS
ORAL_TABLET | ORAL | 0 refills | Status: DC
Start: 2018-01-22 — End: 2018-01-27

## 2018-01-27 ENCOUNTER — Ambulatory Visit: Payer: Medicare Other | Admitting: Family Medicine

## 2018-01-27 ENCOUNTER — Encounter: Payer: Self-pay | Admitting: Family Medicine

## 2018-01-27 VITALS — BP 133/74 | HR 59 | Temp 97.0°F | Ht 65.0 in | Wt 204.6 lb

## 2018-01-27 DIAGNOSIS — I10 Essential (primary) hypertension: Secondary | ICD-10-CM

## 2018-01-27 DIAGNOSIS — R7303 Prediabetes: Secondary | ICD-10-CM | POA: Diagnosis not present

## 2018-01-27 DIAGNOSIS — F329 Major depressive disorder, single episode, unspecified: Secondary | ICD-10-CM

## 2018-01-27 DIAGNOSIS — F32A Depression, unspecified: Secondary | ICD-10-CM

## 2018-01-27 DIAGNOSIS — E785 Hyperlipidemia, unspecified: Secondary | ICD-10-CM

## 2018-01-27 LAB — LIPID PANEL
CHOLESTEROL TOTAL: 93 mg/dL — AB (ref 100–199)
Chol/HDL Ratio: 2.7 ratio (ref 0.0–5.0)
HDL: 35 mg/dL — ABNORMAL LOW (ref >39)
LDL CALC: 36 mg/dL (ref 0–99)
Triglycerides: 109 mg/dL (ref 0–149)
VLDL CHOLESTEROL CAL: 22 mg/dL (ref 5–40)

## 2018-01-27 LAB — BAYER DCA HB A1C WAIVED: HB A1C: 6.2 % (ref <7.0)

## 2018-01-27 MED ORDER — ATORVASTATIN CALCIUM 80 MG PO TABS: ORAL_TABLET | ORAL | 3 refills | Status: DC

## 2018-01-27 NOTE — Progress Notes (Signed)
BP 133/74   Pulse (!) 59   Temp (!) 97 F (36.1 C) (Oral)   Ht 5\' 5"  (1.651 m)   Wt 204 lb 9.6 oz (92.8 kg)   BMI 34.05 kg/m    Subjective:    Patient ID: Bruce Guerrero, male    DOB: 1960-08-21, 57 y.o.   MRN: [id number]  HPI: Bruce Guerrero is a 57 y.o. male presenting on 01/27/2018 for Hyperlipidemia (6 month follow up); Hypertension; Establish Care Ermalinda Memos([place] pt ); and Shoulder Pain (Left- patient states it has been ongoing on and off)   HPI Prediabetes Patient comes in today for recheck of his diabetes. Patient has been currently taking no medication currently and his last A1c is 5.9.. Patient is not currently on an ACE inhibitor/ARB. Patient has not seen an ophthalmologist this year. Patient denies any issues with their feet.   Hypertension Patient is currently on amlodipine and carvedilol, and their blood pressure today is 133/74. Patient denies any lightheadedness or dizziness. Patient denies headaches, blurred vision, chest pains, shortness of breath, or weakness. Denies any side effects from medication and is content with current medication.  Patient has a history of a stroke and has residual right-sided weakness  Hyperlipidemia Patient is coming in for recheck of his hyperlipidemia. The patient is currently taking Lipitor. They deny any issues with myalgias or history of liver damage from it. They deny any focal numbness or weakness or chest pain.   Depression Patient is coming in for depression recheck.  He says he was initially started on Zoloft 5 years ago when he had a stroke, he says he has not really noticed any thing now and he thinks he is back to where he should be.  He is just always been continued on it and does not even know if he still even needs it.  He denies any major feelings of depression or thoughts of hurting himself.  He feels like he is doing overall very well. Depression screen [facility] 01/27/2018 07/27/2017 01/23/2017 09/19/2016 07/22/2016    Decreased Interest 0 0 0 0 0  Down, Depressed, Hopeless 0 1 0 0 0  PHQ - 2 Score 0 1 0 0 0    Relevant past medical, surgical, family and social history reviewed and updated as indicated. Interim medical history since our last visit reviewed. Allergies and medications reviewed and updated.  Review of Systems  Constitutional: Negative for chills and fever.  Respiratory: Negative for shortness of breath and wheezing.   Cardiovascular: Negative for chest pain and leg swelling.  Musculoskeletal: Negative for back pain and gait problem.  Skin: Negative for rash.  Neurological: Negative for dizziness, seizures, weakness and numbness.  Psychiatric/Behavioral: Positive for dysphoric mood. Negative for self-injury, sleep disturbance and suicidal ideas. The patient is nervous/anxious.   All other systems reviewed and are negative.  Per HPI unless specifically indicated above  Allergies as of 01/27/2018   No Known Allergies     Medication List        Accurate as of 01/27/18  8:37 AM. Always use your most recent med list.          allopurinol 300 MG tablet Commonly known as:  ZYLOPRIM TAKE 1 TABLET BY MOUTH EVERY DAY   amLODipine 10 MG tablet Commonly known as:  NORVASC Take 1 tablet (10 mg total) by mouth daily.   atorvastatin 80 MG tablet Commonly known as:  LIPITOR TAKE 1 TABLET BY MOUTH EVERY DAY AT [facility]6PM  carvedilol 6.25 MG tablet Commonly known as:  COREG Take 1 tablet (6.25 mg total) by mouth 2 (two) times daily with a meal.   clopidogrel 75 MG tablet Commonly known as:  PLAVIX Take 1 tablet (75 mg total) by mouth daily.   colchicine 0.6 MG tablet Take 1 tablet (0.6 mg total) by mouth daily.   sertraline 50 MG tablet Commonly known as:  ZOLOFT Take 1 tablet (50 mg total) by mouth daily.          Objective:    BP 133/74   Pulse (!) 59   Temp (!) 97 F (36.1 C) (Oral)   Ht 5\' 5"  (1.651 m)   Wt 204 lb 9.6 oz (92.8 kg)   BMI 34.05 kg/m   Wt Readings  from Last 3 Encounters:  01/27/18 204 lb 9.6 oz (92.8 kg)  07/27/17 199 lb 3.2 oz (90.4 kg)  01/23/17 203 lb 6.4 oz (92.3 kg)    Physical Exam  Constitutional: He is oriented to person, place, and time. He appears well-developed and well-nourished. No distress.  Eyes: Conjunctivae are normal. No scleral icterus.  Neck: Neck supple. No thyromegaly present.  Cardiovascular: Normal rate, regular rhythm, normal heart sounds and intact distal pulses.  No murmur heard. Pulmonary/Chest: Effort normal and breath sounds normal. No respiratory distress. He has no wheezes.  Musculoskeletal: Normal range of motion. He exhibits no edema.  Lymphadenopathy:    He has no cervical adenopathy.  Neurological: He is alert and oriented to person, place, and time. He exhibits abnormal muscle tone (Right sided complete weakness in his arm). Coordination normal.  Skin: Skin is warm and dry. No rash noted. He is not diaphoretic.  Psychiatric: He has a normal mood and affect. His behavior is normal.  Nursing note and vitals reviewed.       Assessment & Plan:   Problem List Items Addressed This Visit      Cardiovascular and Mediastinum   HTN (hypertension)   Relevant Medications   atorvastatin (LIPITOR) 80 MG tablet     Other   Depression   Hyperlipidemia   Relevant Medications   atorvastatin (LIPITOR) 80 MG tablet   Other Relevant Orders   Lipid panel   Prediabetes - Primary   Relevant Orders   Bayer DCA Hb A1c Waived      Continue current medications, will recheck cholesterol and A1c today and see back in 3 months Follow up plan: Return in about 3 months (around 04/29/2018), or if symptoms worsen or fail to improve, for Prediabetes and hypertension and cholesterol.  Counseling provided for all of the vaccine components Orders Placed This Encounter  Procedures  . Bayer DCA Hb A1c Waived  . Lipid panel    Arville Care, MD [facility] 01/27/2018, 8:37  AM

## 2018-04-29 ENCOUNTER — Encounter: Payer: Self-pay | Admitting: Family Medicine

## 2018-04-29 ENCOUNTER — Ambulatory Visit (INDEPENDENT_AMBULATORY_CARE_PROVIDER_SITE_OTHER): Payer: Medicare Other | Admitting: Family Medicine

## 2018-04-29 VITALS — BP 121/62 | HR 54 | Temp 98.1°F | Ht 65.0 in | Wt 204.0 lb

## 2018-04-29 DIAGNOSIS — R7303 Prediabetes: Secondary | ICD-10-CM

## 2018-04-29 DIAGNOSIS — I10 Essential (primary) hypertension: Secondary | ICD-10-CM | POA: Diagnosis not present

## 2018-04-29 DIAGNOSIS — E782 Mixed hyperlipidemia: Secondary | ICD-10-CM | POA: Diagnosis not present

## 2018-04-29 LAB — BAYER DCA HB A1C WAIVED: HB A1C (BAYER DCA - WAIVED): 6.4 % (ref <7.0)

## 2018-04-29 NOTE — Progress Notes (Signed)
BP 121/62   Pulse (!) 54   Temp 98.1 F (36.7 C) (Oral)   Ht '5\' 5"'$  (1.651 m)   Wt 204 lb (92.5 kg)   BMI 33.95 kg/m    Subjective:    Patient ID: Bruce Guerrero, male    DOB: Sep 22, 1960, 58 y.o.   MRN: [id number]  HPI: Bruce Guerrero is a 58 y.o. male presenting on 04/29/2018 for Medical Management of Chronic Issues   HPI Hypertension Patient is currently on amlodipine and carvedilol, and their blood pressure today is 121/62. Patient denies any lightheadedness or dizziness. Patient denies headaches, blurred vision, chest pains, shortness of breath, or weakness. Denies any side effects from medication and is content with current medication.   Prediabetes Patient comes in today for recheck of his diabetes. Patient has been currently taking no medications and has been diet controlled. Patient is not currently on an ACE inhibitor/ARB. Patient has not seen an ophthalmologist this year. Patient denies any issues with their feet.   Hyperlipidemia Patient is coming in for recheck of his hyperlipidemia. The patient is currently taking lipitor. They deny any issues with myalgias or history of liver damage from it. They deny any focal numbness or weakness or chest pain.   Relevant past medical, surgical, family and social history reviewed and updated as indicated. Interim medical history since our last visit reviewed. Allergies and medications reviewed and updated.  Review of Systems  Constitutional: Negative for chills and fever.  Eyes: Negative for visual disturbance.  Respiratory: Negative for shortness of breath and wheezing.   Cardiovascular: Negative for chest pain and leg swelling.  Musculoskeletal: Negative for back pain and gait problem.  Skin: Negative for rash.  All other systems reviewed and are negative.   Per HPI unless specifically indicated above   Allergies as of 04/29/2018   No Known Allergies     Medication List       Accurate as of April 29, 2018   8:10 AM. Always use your most recent med list.        allopurinol 300 MG tablet Commonly known as:  ZYLOPRIM TAKE 1 TABLET BY MOUTH EVERY DAY   amLODipine 10 MG tablet Commonly known as:  NORVASC Take 1 tablet (10 mg total) by mouth daily.   atorvastatin 80 MG tablet Commonly known as:  LIPITOR TAKE 1 TABLET BY MOUTH EVERY DAY AT 6PM   carvedilol 6.25 MG tablet Commonly known as:  COREG Take 1 tablet (6.25 mg total) by mouth 2 (two) times daily with a meal.   clopidogrel 75 MG tablet Commonly known as:  PLAVIX Take 1 tablet (75 mg total) by mouth daily.   colchicine 0.6 MG tablet Commonly known as:  COLCRYS Take 1 tablet (0.6 mg total) by mouth daily.   sertraline 50 MG tablet Commonly known as:  ZOLOFT Take 1 tablet (50 mg total) by mouth daily.          Objective:    BP 121/62   Pulse (!) 54   Temp 98.1 F (36.7 C) (Oral)   Ht '5\' 5"'$  (1.651 m)   Wt 204 lb (92.5 kg)   BMI 33.95 kg/m   Wt Readings from Last 3 Encounters:  04/29/18 204 lb (92.5 kg)  01/27/18 204 lb 9.6 oz (92.8 kg)  07/27/17 199 lb 3.2 oz (90.4 kg)    Physical Exam Vitals signs and nursing note reviewed.  Constitutional:      General: He is not in acute distress.  Appearance: He is well-developed. He is not diaphoretic.  Eyes:     General: No scleral icterus.    Conjunctiva/sclera: Conjunctivae normal.  Neck:     Musculoskeletal: Neck supple.     Thyroid: No thyromegaly.  Cardiovascular:     Rate and Rhythm: Normal rate and regular rhythm.     Heart sounds: Normal heart sounds. No murmur.  Pulmonary:     Effort: Pulmonary effort is normal. No respiratory distress.     Breath sounds: Normal breath sounds. No wheezing.  Musculoskeletal: Normal range of motion.  Lymphadenopathy:     Cervical: No cervical adenopathy.  Skin:    General: Skin is warm and dry.     Findings: No rash.  Neurological:     Mental Status: He is alert and oriented to person, place, and time.      Coordination: Coordination normal.  Psychiatric:        Behavior: Behavior normal.         Assessment & Plan:   Problem List Items Addressed This Visit      Cardiovascular and Mediastinum   HTN (hypertension)   Relevant Orders   CMP14+EGFR     Other   Hyperlipidemia - Primary   Relevant Orders   Lipid panel   Prediabetes   Relevant Orders   Bayer DCA Hb A1c Waived   CMP14+EGFR        Follow up plan: Return in about 3 months (around 07/28/2018), or if symptoms worsen or fail to improve, for Prediabetes and hypertension cholesterol.  Counseling provided for all of the vaccine components Orders Placed This Encounter  Procedures  . Bayer DCA Hb A1c Waived  . CMP14+EGFR  . Lipid panel    Caryl Pina, MD [facility] 04/29/2018, 8:10 AM

## 2018-04-30 LAB — CMP14+EGFR
A/G RATIO: 3.1 — AB (ref 1.2–2.2)
ALBUMIN: 4.9 g/dL (ref 3.8–4.9)
ALK PHOS: 97 IU/L (ref 39–117)
ALT: 36 IU/L (ref 0–44)
AST: 25 IU/L (ref 0–40)
BILIRUBIN TOTAL: 0.8 mg/dL (ref 0.0–1.2)
BUN/Creatinine Ratio: 11 (ref 9–20)
BUN: 14 mg/dL (ref 6–24)
CHLORIDE: 100 mmol/L (ref 96–106)
CO2: 23 mmol/L (ref 20–29)
Calcium: 9.6 mg/dL (ref 8.7–10.2)
Creatinine, Ser: 1.29 mg/dL — ABNORMAL HIGH (ref 0.76–1.27)
GFR calc Af Amer: 71 mL/min/{1.73_m2} (ref >59)
GFR calc non Af Amer: 61 mL/min/{1.73_m2} (ref >59)
GLOBULIN, TOTAL: 1.6 g/dL (ref 1.5–4.5)
Glucose: 137 mg/dL — ABNORMAL HIGH (ref 65–99)
Potassium: 4.4 mmol/L (ref 3.5–5.2)
Sodium: 140 mmol/L (ref 134–144)
Total Protein: 6.5 g/dL (ref 6.0–8.5)

## 2018-04-30 LAB — LIPID PANEL
CHOLESTEROL TOTAL: 90 mg/dL — AB (ref 100–199)
Chol/HDL Ratio: 2.6 ratio (ref 0.0–5.0)
HDL: 34 mg/dL — ABNORMAL LOW (ref >39)
LDL Calculated: 37 mg/dL (ref 0–99)
TRIGLYCERIDES: 97 mg/dL (ref 0–149)
VLDL Cholesterol Cal: 19 mg/dL (ref 5–40)

## 2018-05-06 ENCOUNTER — Ambulatory Visit (INDEPENDENT_AMBULATORY_CARE_PROVIDER_SITE_OTHER): Payer: Medicare Other | Admitting: Nurse Practitioner

## 2018-05-06 ENCOUNTER — Encounter: Payer: Self-pay | Admitting: Nurse Practitioner

## 2018-05-06 VITALS — BP 150/76 | HR 67 | Temp 96.9°F | Ht 65.0 in | Wt 203.0 lb

## 2018-05-06 DIAGNOSIS — H60391 Other infective otitis externa, right ear: Secondary | ICD-10-CM | POA: Diagnosis not present

## 2018-05-06 DIAGNOSIS — H6122 Impacted cerumen, left ear: Secondary | ICD-10-CM | POA: Diagnosis not present

## 2018-05-06 MED ORDER — OFLOXACIN 0.3 % OT SOLN: 5.0000 [drp] | Freq: Every day | OTIC | 0 refills | Status: DC

## 2018-05-06 NOTE — Patient Instructions (Signed)

## 2018-05-06 NOTE — Progress Notes (Signed)
   Subjective:    Patient ID: Bruce Guerrero, male    DOB: September 19, 1960, 58 y.o.   MRN: [id number]   Chief Complaint: Right ear feels stopped up   HPI Patient come sin today c/o right ear stopped up. He has been using debrox with no relief. Denies any drainage or pain.   Review of Systems  Constitutional: Negative.   HENT: Negative for ear discharge and ear pain.   Respiratory: Negative.   Cardiovascular: Negative.   Neurological: Negative.   Psychiatric/Behavioral: Negative.   All other systems reviewed and are negative.      Objective:   Physical Exam Vitals signs and nursing note reviewed.  Constitutional:      Appearance: He is normal weight.  HENT:     Right Ear: No foreign body. Tympanic membrane is not erythematous.     Left Ear: Hearing, tympanic membrane and external ear normal. A foreign body (cerumen impaction) is present.     Ears:     Comments: Right ear canal erythematous Cardiovascular:     Rate and Rhythm: Normal rate and regular rhythm.     Heart sounds: Normal heart sounds.  Pulmonary:     Effort: Pulmonary effort is normal.     Breath sounds: Normal breath sounds.  Skin:    General: Skin is warm and dry.  Neurological:     General: No focal deficit present.     Mental Status: He is alert and oriented to person, place, and time.  Psychiatric:        Mood and Affect: Mood normal.        Behavior: Behavior normal.    BP (!) 150/76   Pulse 67   Temp (!) 96.9 F (36.1 C) (Oral)   Ht 5\' 5"  (1.651 m)   Wt 203 lb (92.1 kg)   BMI 33.78 kg/m   S/p left ear irrigation- canal normal, TM normal       Assessment & Plan:  Bruce Guerrero in today with chief complaint of Right ear feels stopped up   1. Other infective acute otitis externa of right ear Meds ordered this encounter  Medications  . ofloxacin (FLOXIN OTIC) 0.3 % OTIC solution    Sig: Place 5 drops into the right ear daily.    Dispense:  5 mL    Refill:  0    Order Specific  Question:   Supervising Provider    Answer:   Arville Care A [id number]   Avoid getting water in ear Do not stick any objects in ear  2. Impacted cerumen of left ear Debrox OTC 2-3 x a week Again do not stick anything in ear  Mary-Margaret Daphine Deutscher, FNP

## 2018-07-28 ENCOUNTER — Other Ambulatory Visit: Payer: Self-pay

## 2018-07-29 ENCOUNTER — Encounter: Payer: Self-pay | Admitting: Family Medicine

## 2018-07-29 ENCOUNTER — Ambulatory Visit (INDEPENDENT_AMBULATORY_CARE_PROVIDER_SITE_OTHER): Payer: Medicare Other | Admitting: Family Medicine

## 2018-07-29 VITALS — BP 128/74 | HR 53 | Temp 97.0°F | Ht 65.0 in | Wt 205.0 lb

## 2018-07-29 DIAGNOSIS — Z0000 Encounter for general adult medical examination without abnormal findings: Secondary | ICD-10-CM

## 2018-07-29 DIAGNOSIS — R7303 Prediabetes: Secondary | ICD-10-CM | POA: Diagnosis not present

## 2018-07-29 DIAGNOSIS — I10 Essential (primary) hypertension: Secondary | ICD-10-CM

## 2018-07-29 DIAGNOSIS — E785 Hyperlipidemia, unspecified: Secondary | ICD-10-CM | POA: Diagnosis not present

## 2018-07-29 DIAGNOSIS — Z0001 Encounter for general adult medical examination with abnormal findings: Secondary | ICD-10-CM | POA: Diagnosis not present

## 2018-07-29 DIAGNOSIS — F3341 Major depressive disorder, recurrent, in partial remission: Secondary | ICD-10-CM

## 2018-07-29 LAB — BAYER DCA HB A1C WAIVED: HB A1C (BAYER DCA - WAIVED): 6.5 % (ref <7.0)

## 2018-07-29 MED ORDER — AMLODIPINE BESYLATE 10 MG PO TABS: 10.0000 mg | ORAL_TABLET | Freq: Every day | ORAL | 3 refills | Status: DC

## 2018-07-29 MED ORDER — COLCHICINE 0.6 MG PO TABS: 0.6000 mg | ORAL_TABLET | Freq: Every day | ORAL | 3 refills | Status: DC

## 2018-07-29 MED ORDER — ATORVASTATIN CALCIUM 80 MG PO TABS: ORAL_TABLET | ORAL | 3 refills | Status: DC

## 2018-07-29 MED ORDER — ALLOPURINOL 300 MG PO TABS: ORAL_TABLET | ORAL | 3 refills | Status: DC

## 2018-07-29 MED ORDER — CARVEDILOL 6.25 MG PO TABS: 6.2500 mg | ORAL_TABLET | Freq: Two times a day (BID) | ORAL | 3 refills | Status: DC

## 2018-07-29 MED ORDER — CLOPIDOGREL BISULFATE 75 MG PO TABS: 75.0000 mg | ORAL_TABLET | Freq: Every day | ORAL | 3 refills | Status: DC

## 2018-07-29 NOTE — Progress Notes (Signed)
BP 128/74   Pulse (!) 53   Temp (!) 97 F (36.1 C) (Oral)   Ht '5\' 5"'$  (1.651 m)   Wt 205 lb (93 kg)   BMI 34.11 kg/m    Subjective:   Patient ID: Bruce Guerrero, male    DOB: Dec 27, 1960, 58 y.o.   MRN: [id number]  HPI: Bruce Guerrero is a 58 y.o. male presenting on 07/29/2018 for Annual Exam   HPI Adult well exam and physical Patient is coming in today for adult well exam and physical and recheck of chronic issues.  He is here for recheck of hypertension and cholesterol and his prediabetes.  He is also here for recheck of his major depression for which he has been taking Zoloft but he says he does not feel like he needs it and he wants to taper off of it, recommended to go every other day for the next week and then come off of it and give [name] call back if he feels like he still needs in the near future.  Patient feels like everything is going well and his blood pressure today is 128/74.  He denies any chest pain or shortness of breath.  Patient is still weak on his right arm and right leg secondary to a previous stroke but nothing has changed there.  Relevant past medical, surgical, family and social history reviewed and updated as indicated. Interim medical history since our last visit reviewed. Allergies and medications reviewed and updated.  Review of Systems  Constitutional: Negative for chills and fever.  HENT: Negative for ear pain and tinnitus.   Eyes: Negative for pain.  Respiratory: Negative for cough, shortness of breath and wheezing.   Cardiovascular: Negative for chest pain, palpitations and leg swelling.  Gastrointestinal: Negative for abdominal pain, blood in stool, constipation and diarrhea.  Genitourinary: Negative for dysuria and hematuria.  Musculoskeletal: Negative for back pain, gait problem and myalgias.  Skin: Negative for rash.  Neurological: Positive for weakness. Negative for dizziness and headaches.  Psychiatric/Behavioral: Negative for suicidal  ideas.  All other systems reviewed and are negative.   Per HPI unless specifically indicated above   Allergies as of 07/29/2018   No Known Allergies     Medication List       Accurate as of Jul 29, 2018 10:48 AM. If you have any questions, ask your nurse or doctor.        STOP taking these medications   ofloxacin 0.3 % OTIC solution Commonly known as:  Floxin Otic Stopped by:  Fransisca Kaufmann Kwaku Mostafa, MD     TAKE these medications   allopurinol 300 MG tablet Commonly known as:  ZYLOPRIM TAKE 1 TABLET BY MOUTH EVERY DAY   amLODipine 10 MG tablet Commonly known as:  NORVASC Take 1 tablet (10 mg total) by mouth daily.   atorvastatin 80 MG tablet Commonly known as:  LIPITOR TAKE 1 TABLET BY MOUTH EVERY DAY AT 6PM   carvedilol 6.25 MG tablet Commonly known as:  COREG Take 1 tablet (6.25 mg total) by mouth 2 (two) times daily with a meal.   clopidogrel 75 MG tablet Commonly known as:  PLAVIX Take 1 tablet (75 mg total) by mouth daily.   colchicine 0.6 MG tablet Commonly known as:  Colcrys Take 1 tablet (0.6 mg total) by mouth daily.   sertraline 50 MG tablet Commonly known as:  ZOLOFT Take 1 tablet (50 mg total) by mouth daily.        Objective:  BP 128/74   Pulse (!) 53   Temp (!) 97 F (36.1 C) (Oral)   Ht '5\' 5"'$  (1.651 m)   Wt 205 lb (93 kg)   BMI 34.11 kg/m   Wt Readings from Last 3 Encounters:  07/29/18 205 lb (93 kg)  05/06/18 203 lb (92.1 kg)  04/29/18 204 lb (92.5 kg)    Physical Exam Vitals signs and nursing note reviewed.  Constitutional:      General: He is not in acute distress.    Appearance: He is well-developed. He is not diaphoretic.  Eyes:     General: No scleral icterus.    Conjunctiva/sclera: Conjunctivae normal.  Neck:     Musculoskeletal: Neck supple.     Thyroid: No thyromegaly.  Cardiovascular:     Rate and Rhythm: Normal rate and regular rhythm.     Heart sounds: Normal heart sounds. No murmur.  Pulmonary:     Effort:  Pulmonary effort is normal. No respiratory distress.     Breath sounds: Normal breath sounds. No wheezing.  Musculoskeletal: Normal range of motion.  Lymphadenopathy:     Cervical: No cervical adenopathy.  Skin:    General: Skin is warm and dry.     Findings: No rash.  Neurological:     Mental Status: He is alert and oriented to person, place, and time.     Motor: Weakness (Right sided 0 out of 5 in right arm and 1 out of 5 in the right leg) present.     Gait: Gait abnormal.  Psychiatric:        Behavior: Behavior normal.     Results for orders placed or performed in visit on 04/29/18  Bayer DCA Hb A1c Waived  Result Value Ref Range   HB A1C (BAYER DCA - WAIVED) 6.4 <7.0 %  CMP14+EGFR  Result Value Ref Range   Glucose 137 (H) 65 - 99 mg/dL   BUN 14 6 - 24 mg/dL   Creatinine, Ser 1.29 (H) 0.76 - 1.27 mg/dL   GFR calc non Af Amer 61 >59 mL/min/1.73   GFR calc Af Amer 71 >59 mL/min/1.73   BUN/Creatinine Ratio 11 9 - 20   Sodium 140 134 - 144 mmol/L   Potassium 4.4 3.5 - 5.2 mmol/L   Chloride 100 96 - 106 mmol/L   CO2 23 20 - 29 mmol/L   Calcium 9.6 8.7 - 10.2 mg/dL   Total Protein 6.5 6.0 - 8.5 g/dL   Albumin 4.9 3.8 - 4.9 g/dL   Globulin, Total 1.6 1.5 - 4.5 g/dL   Albumin/Globulin Ratio 3.1 (H) 1.2 - 2.2   Bilirubin Total 0.8 0.0 - 1.2 mg/dL   Alkaline Phosphatase 97 39 - 117 IU/L   AST 25 0 - 40 IU/L   ALT 36 0 - 44 IU/L  Lipid panel  Result Value Ref Range   Cholesterol, Total 90 (L) 100 - 199 mg/dL   Triglycerides 97 0 - 149 mg/dL   HDL 34 (L) >39 mg/dL   VLDL Cholesterol Cal 19 5 - 40 mg/dL   LDL Calculated 37 0 - 99 mg/dL   Chol/HDL Ratio 2.6 0.0 - 5.0 ratio    Assessment & Plan:   Problem List Items Addressed This Visit      Cardiovascular and Mediastinum   HTN (hypertension)   Relevant Medications   amLODipine (NORVASC) 10 MG tablet   atorvastatin (LIPITOR) 80 MG tablet   carvedilol (COREG) 6.25 MG tablet   Other Relevant Orders  CBC with  Differential/Platelet   CMP14+EGFR     Other   Depression   Hyperlipidemia   Relevant Medications   amLODipine (NORVASC) 10 MG tablet   atorvastatin (LIPITOR) 80 MG tablet   carvedilol (COREG) 6.25 MG tablet   Other Relevant Orders   CMP14+EGFR   Lipid panel   Prediabetes   Relevant Orders   CMP14+EGFR   Bayer DCA Hb A1c Waived    Other Visit Diagnoses    Well adult exam    -  Primary      Continue current medications, will check blood work today and see where he is at and if doing well will see back in 6 months,  Patient wants to taper off Zoloft and will take it every other day for the next week and then come off of it. Follow up plan: Return in about 6 months (around 01/29/2019), or if symptoms worsen or fail to improve, for Prediabetes and hypertension recheck.  Counseling provided for all of the vaccine components Orders Placed This Encounter  Procedures  . CBC with Differential/Platelet  . CMP14+EGFR  . Lipid panel  . Bayer [facility] Hb A1c [place], MD [facility] 07/29/2018, 10:48 AM

## 2018-07-30 LAB — CMP14+EGFR
ALT: 30 IU/L (ref 0–44)
AST: 24 IU/L (ref 0–40)
Albumin/Globulin Ratio: 2.8 — ABNORMAL HIGH (ref 1.2–2.2)
Albumin: 4.7 g/dL (ref 3.8–4.9)
Alkaline Phosphatase: 94 IU/L (ref 39–117)
BUN/Creatinine Ratio: 11 (ref 9–20)
BUN: 14 mg/dL (ref 6–24)
Bilirubin Total: 0.7 mg/dL (ref 0.0–1.2)
CO2: 25 mmol/L (ref 20–29)
Calcium: 9.5 mg/dL (ref 8.7–10.2)
Chloride: 100 mmol/L (ref 96–106)
Creatinine, Ser: 1.31 mg/dL — ABNORMAL HIGH (ref 0.76–1.27)
GFR calc Af Amer: 69 mL/min/{1.73_m2} (ref >59)
GFR calc non Af Amer: 60 mL/min/{1.73_m2} (ref >59)
Globulin, Total: 1.7 g/dL (ref 1.5–4.5)
Glucose: 126 mg/dL — ABNORMAL HIGH (ref 65–99)
Potassium: 4.5 mmol/L (ref 3.5–5.2)
Sodium: 138 mmol/L (ref 134–144)
Total Protein: 6.4 g/dL (ref 6.0–8.5)

## 2018-07-30 LAB — CBC WITH DIFFERENTIAL/PLATELET
Basophils Absolute: 0 10*3/uL (ref 0.0–0.2)
Basos: 0 %
EOS (ABSOLUTE): 0.1 10*3/uL (ref 0.0–0.4)
Eos: 1 %
Hematocrit: 44.8 % (ref 37.5–51.0)
Hemoglobin: 15.7 g/dL (ref 13.0–17.7)
Immature Grans (Abs): 0 10*3/uL (ref 0.0–0.1)
Immature Granulocytes: 0 %
Lymphocytes Absolute: 2.3 10*3/uL (ref 0.7–3.1)
Lymphs: 32 %
MCH: 33.1 pg — ABNORMAL HIGH (ref 26.6–33.0)
MCHC: 35 g/dL (ref 31.5–35.7)
MCV: 95 fL (ref 79–97)
Monocytes Absolute: 0.4 10*3/uL (ref 0.1–0.9)
Monocytes: 6 %
Neutrophils Absolute: 4.3 10*3/uL (ref 1.4–7.0)
Neutrophils: 61 %
Platelets: 159 10*3/uL (ref 150–450)
RBC: 4.74 x10E6/uL (ref 4.14–5.80)
RDW: 12.8 % (ref 11.6–15.4)
WBC: 7.1 10*3/uL (ref 3.4–10.8)

## 2018-07-30 LAB — LIPID PANEL
Chol/HDL Ratio: 3.2 ratio (ref 0.0–5.0)
Cholesterol, Total: 101 mg/dL (ref 100–199)
HDL: 32 mg/dL — ABNORMAL LOW (ref >39)
LDL Calculated: 44 mg/dL (ref 0–99)
Triglycerides: 126 mg/dL (ref 0–149)
VLDL Cholesterol Cal: 25 mg/dL (ref 5–40)

## 2018-09-09 ENCOUNTER — Other Ambulatory Visit: Payer: Self-pay

## 2018-09-10 ENCOUNTER — Ambulatory Visit (INDEPENDENT_AMBULATORY_CARE_PROVIDER_SITE_OTHER): Payer: Medicare Other | Admitting: Family Medicine

## 2018-09-10 ENCOUNTER — Encounter: Payer: Self-pay | Admitting: Family Medicine

## 2018-09-10 ENCOUNTER — Other Ambulatory Visit: Payer: Self-pay

## 2018-09-10 VITALS — BP 110/56 | HR 51 | Temp 96.6°F | Ht 65.0 in | Wt 205.2 lb

## 2018-09-10 DIAGNOSIS — S39012A Strain of muscle, fascia and tendon of lower back, initial encounter: Secondary | ICD-10-CM | POA: Diagnosis not present

## 2018-09-10 MED ORDER — PREDNISONE 20 MG PO TABS: ORAL_TABLET | ORAL | 0 refills | Status: DC

## 2018-09-10 NOTE — Progress Notes (Signed)
BP (!) 110/56   Pulse (!) 51   Temp (!) 96.6 F (35.9 C) (Oral)   Ht 5\' 5"  (1.651 m)   Wt 205 lb 3.2 oz (93.1 kg)   BMI 34.15 kg/m    Subjective:   Patient ID: Bruce Guerrero, male    DOB: 06-25-1960, 58 y.o.   MRN: [id number]  HPI: Bruce Guerrero is a 58 y.o. male presenting on 09/10/2018 for Back Pain (x 1 week)   HPI Back pain Patient comes in complaining of back pain without radiation.  Patient says he had the back pain first when he was in bed and he was reaching over to turn off his alarm and grabbed the covers and pulled back and felt something pull tight in his back and that is been hurting since.  He has been using a lot of naproxen since that time he does not feel like it is helping that much and he feels like it is been about the same.  Is not worsening or getting better.  It hurts in a spot just below his scapula.  Relevant past medical, surgical, family and social history reviewed and updated as indicated. Interim medical history since our last visit reviewed. Allergies and medications reviewed and updated.  Review of Systems  Constitutional: Negative for chills and fever.  Respiratory: Negative for shortness of breath and wheezing.   Cardiovascular: Negative for chest pain and leg swelling.  Musculoskeletal: Positive for back pain. Negative for arthralgias, gait problem and neck pain.  Skin: Negative for rash.  All other systems reviewed and are negative.   Per HPI unless specifically indicated above   Allergies as of 09/10/2018   No Known Allergies     Medication List       Accurate as of September 10, 2018  8:18 AM. If you have any questions, ask your nurse or doctor.        allopurinol 300 MG tablet Commonly known as: ZYLOPRIM TAKE 1 TABLET BY MOUTH EVERY DAY   amLODipine 10 MG tablet Commonly known as: NORVASC Take 1 tablet (10 mg total) by mouth daily.   atorvastatin 80 MG tablet Commonly known as: LIPITOR TAKE 1 TABLET BY MOUTH EVERY DAY AT  6PM   carvedilol 6.25 MG tablet Commonly known as: COREG Take 1 tablet (6.25 mg total) by mouth 2 (two) times daily with a meal.   clopidogrel 75 MG tablet Commonly known as: PLAVIX Take 1 tablet (75 mg total) by mouth daily.   colchicine 0.6 MG tablet Commonly known as: Colcrys Take 1 tablet (0.6 mg total) by mouth daily.   predniSONE 20 MG tablet Commonly known as: DELTASONE 2 po at same time daily for 5 days Started by: Worthy Rancher, MD   sertraline 50 MG tablet Commonly known as: ZOLOFT Take 1 tablet (50 mg total) by mouth daily.        Objective:   BP (!) 110/56   Pulse (!) 51   Temp (!) 96.6 F (35.9 C) (Oral)   Ht 5\' 5"  (1.651 m)   Wt 205 lb 3.2 oz (93.1 kg)   BMI 34.15 kg/m   Wt Readings from Last 3 Encounters:  09/10/18 205 lb 3.2 oz (93.1 kg)  07/29/18 205 lb (93 kg)  05/06/18 203 lb (92.1 kg)    Physical Exam Vitals signs and nursing note reviewed.  Constitutional:      General: He is not in acute distress.    Appearance: He is well-developed.  He is not diaphoretic.  Eyes:     General: No scleral icterus.    Conjunctiva/sclera: Conjunctivae normal.  Neck:     Musculoskeletal: Neck supple.     Thyroid: No thyromegaly.  Musculoskeletal: Normal range of motion.     Thoracic back: He exhibits tenderness. He exhibits normal range of motion and no swelling.       Back:  Lymphadenopathy:     Cervical: No cervical adenopathy.  Skin:    General: Skin is warm and dry.     Findings: No rash.  Neurological:     Mental Status: He is alert and oriented to person, place, and time.     Coordination: Coordination normal.  Psychiatric:        Behavior: Behavior normal.     Assessment & Plan:   Problem List Items Addressed This Visit    None    Visit Diagnoses    Back strain, initial encounter    -  Primary   Relevant Medications   predniSONE (DELTASONE) 20 MG tablet      Recommend stretching and massage and could use a tennis ball for  this and will do short course of prednisone.  Discussed muscle relaxer but patient would like to avoid that for now because of sedative properties. Follow up plan: Return if symptoms worsen or fail to improve.  Counseling provided for all of the vaccine components No orders of the defined types were placed in this encounter.   Arville CareJoshua Alassane Kalafut, MD [facility] 09/10/2018, 8:18 AM

## 2018-10-07 ENCOUNTER — Ambulatory Visit (INDEPENDENT_AMBULATORY_CARE_PROVIDER_SITE_OTHER): Payer: Medicare Other | Admitting: Family Medicine

## 2018-10-07 ENCOUNTER — Other Ambulatory Visit: Payer: Self-pay | Admitting: *Deleted

## 2018-10-07 ENCOUNTER — Other Ambulatory Visit: Payer: Self-pay

## 2018-10-07 ENCOUNTER — Encounter: Payer: Self-pay | Admitting: Family Medicine

## 2018-10-07 DIAGNOSIS — F411 Generalized anxiety disorder: Secondary | ICD-10-CM

## 2018-10-07 MED ORDER — SERTRALINE HCL 50 MG PO TABS: 50.0000 mg | ORAL_TABLET | Freq: Every day | ORAL | 3 refills | Status: DC

## 2018-10-07 NOTE — Progress Notes (Signed)
Virtual Visit via telephone Note  I connected with Bruce Guerrero on 10/07/18 at 1603 by telephone and verified that I am speaking with the correct person using two identifiers. Bruce Guerrero is currently located at home and no other people are currently with her during visit. The provider, Fransisca Kaufmann Barclay Lennox, MD is located in their office at time of visit.  Call ended at 1611  I discussed the limitations, risks, security and privacy concerns of performing an evaluation and management service by telephone and the availability of in person appointments. I also discussed with the patient that there may be a patient responsible charge related to this service. The patient expressed understanding and agreed to proceed.   History and Present Illness: Patient is calling in to discuss sertraline and he came off of it and now he is more irritable and short fused and anxious.  He has been having this build up over the past few weeks and he has a short temper and feels aggravated. He would like to restart the sertraline.  Patient denies any suicidal ideations or thoughts of hurting himself.  No diagnosis found.  Outpatient Encounter Medications as of 10/07/2018  Medication Sig  . allopurinol (ZYLOPRIM) 300 MG tablet TAKE 1 TABLET BY MOUTH EVERY DAY  . amLODipine (NORVASC) 10 MG tablet Take 1 tablet (10 mg total) by mouth daily.  Marland Kitchen atorvastatin (LIPITOR) 80 MG tablet TAKE 1 TABLET BY MOUTH EVERY DAY AT 6PM  . carvedilol (COREG) 6.25 MG tablet Take 1 tablet (6.25 mg total) by mouth 2 (two) times daily with a meal.  . clopidogrel (PLAVIX) 75 MG tablet Take 1 tablet (75 mg total) by mouth daily.  . colchicine (COLCRYS) 0.6 MG tablet Take 1 tablet (0.6 mg total) by mouth daily.  . predniSONE (DELTASONE) 20 MG tablet 2 po at same time daily for 5 days   Facility-Administered Encounter Medications as of 10/07/2018  Medication  . 0.9 %  sodium chloride infusion    Review of Systems   Constitutional: Negative for chills and fever.  Respiratory: Negative for shortness of breath and wheezing.   Cardiovascular: Negative for chest pain and leg swelling.  Musculoskeletal: Negative for back pain and gait problem.  Skin: Negative for rash.  Psychiatric/Behavioral: Positive for decreased concentration and dysphoric mood. Negative for self-injury, sleep disturbance and suicidal ideas. The patient is nervous/anxious.   All other systems reviewed and are negative.   Observations/Objective: Patient sounds comfortable and in no acute distress   Assessment and Plan: Problem List Items Addressed This Visit    None    Visit Diagnoses    GAD (generalized anxiety disorder)    -  Primary   Relevant Medications   sertraline (ZOLOFT) 50 MG tablet       Follow Up Instructions:  Restart anxiety medication and he will follow-up in November for his usual appointment but if he has any issues he will call back sooner for a sooner appointment.   I discussed the assessment and treatment plan with the patient. The patient was provided an opportunity to ask questions and all were answered. The patient agreed with the plan and demonstrated an understanding of the instructions.   The patient was advised to call back or seek an in-person evaluation if the symptoms worsen or if the condition fails to improve as anticipated.  The above assessment and management plan was discussed with the patient. The patient verbalized understanding of and has agreed to the management plan. Patient  is aware to call the clinic if symptoms persist or worsen. Patient is aware when to return to the clinic for a follow-up visit. Patient educated on when it is appropriate to go to the emergency department.    I provided 8 minutes of non-face-to-face time during this encounter.    Worthy Rancher, MD

## 2018-11-29 ENCOUNTER — Other Ambulatory Visit: Payer: Self-pay

## 2018-11-30 ENCOUNTER — Ambulatory Visit (INDEPENDENT_AMBULATORY_CARE_PROVIDER_SITE_OTHER): Payer: Medicare Other

## 2018-11-30 DIAGNOSIS — Z23 Encounter for immunization: Secondary | ICD-10-CM

## 2019-01-27 ENCOUNTER — Telehealth: Payer: Self-pay | Admitting: Family Medicine

## 2019-01-27 ENCOUNTER — Other Ambulatory Visit: Payer: Self-pay

## 2019-01-28 ENCOUNTER — Ambulatory Visit (INDEPENDENT_AMBULATORY_CARE_PROVIDER_SITE_OTHER): Payer: Medicare Other | Admitting: Family Medicine

## 2019-01-28 ENCOUNTER — Encounter: Payer: Self-pay | Admitting: Family Medicine

## 2019-01-28 VITALS — BP 131/69 | HR 60 | Temp 97.5°F | Ht 65.0 in | Wt 204.0 lb

## 2019-01-28 DIAGNOSIS — E782 Mixed hyperlipidemia: Secondary | ICD-10-CM | POA: Diagnosis not present

## 2019-01-28 DIAGNOSIS — R7303 Prediabetes: Secondary | ICD-10-CM

## 2019-01-28 DIAGNOSIS — I10 Essential (primary) hypertension: Secondary | ICD-10-CM

## 2019-01-28 DIAGNOSIS — Z125 Encounter for screening for malignant neoplasm of prostate: Secondary | ICD-10-CM

## 2019-01-28 DIAGNOSIS — F3341 Major depressive disorder, recurrent, in partial remission: Secondary | ICD-10-CM

## 2019-01-28 LAB — BAYER DCA HB A1C WAIVED: HB A1C (BAYER DCA - WAIVED): 5.8 % (ref <7.0)

## 2019-01-28 NOTE — Progress Notes (Signed)
 BP 131/69   Pulse 60   Temp (!) 97.5 F (36.4 C) (Temporal)   Ht 5' 5" (1.651 m)   Wt 204 lb (92.5 kg)   SpO2 97%   BMI 33.95 kg/m    Subjective:   Patient ID: Bruce Guerrero, male    DOB: 07/16/1960, 58 y.o.   MRN: [id number]  HPI: Bruce Guerrero is a 58 y.o. male presenting on 01/28/2019 for Hyperlipidemia (6 month follow up)   HPI Prediabetes Patient comes in today for recheck of his diabetes. Patient has been currently taking no medication has been diet controlled we are monitoring. Patient is not currently on an ACE inhibitor/ARB. Patient has not seen an ophthalmologist this year. Patient denies any issues with their feet.   Hyperlipidemia Patient is coming in for recheck of his hyperlipidemia. The patient is currently taking Lipitor. They deny any issues with myalgias or history of liver damage from it. They deny any focal numbness or weakness or chest pain.   Hypertension Patient is currently on amlodipine and carvedilol, and their blood pressure today is 131/69. Patient denies any lightheadedness or dizziness. Patient denies headaches, blurred vision, chest pains, shortness of breath, or weakness. Denies any side effects from medication and is content with current medication.   Patient is coming in for recheck of depression and anxiety.  He is currently taking Zoloft and feels like things are going pretty well and per his words he does not feel irritable not want to kill anybody.  He feels like things are going pretty well and he is very content.  Relevant past medical, surgical, family and social history reviewed and updated as indicated. Interim medical history since our last visit reviewed. Allergies and medications reviewed and updated.  Review of Systems  Constitutional: Negative for chills and fever.  Eyes: Negative for discharge.  Respiratory: Negative for shortness of breath and wheezing.   Cardiovascular: Negative for chest pain and leg swelling.   Musculoskeletal: Negative for back pain and gait problem.  Skin: Negative for rash.  Neurological: Negative for dizziness, weakness and light-headedness.  All other systems reviewed and are negative.   Per HPI unless specifically indicated above   Allergies as of 01/28/2019   No Known Allergies     Medication List       Accurate as of January 28, 2019  8:26 AM. If you have any questions, ask your nurse or doctor.        STOP taking these medications   predniSONE 20 MG tablet Commonly known as: DELTASONE Stopped by: Joshua A Dettinger, MD     TAKE these medications   allopurinol 300 MG tablet Commonly known as: ZYLOPRIM TAKE 1 TABLET BY MOUTH EVERY DAY   amLODipine 10 MG tablet Commonly known as: NORVASC Take 1 tablet (10 mg total) by mouth daily.   atorvastatin 80 MG tablet Commonly known as: LIPITOR TAKE 1 TABLET BY MOUTH EVERY DAY AT 6PM   carvedilol 6.25 MG tablet Commonly known as: COREG Take 1 tablet (6.25 mg total) by mouth 2 (two) times daily with a meal.   clopidogrel 75 MG tablet Commonly known as: PLAVIX Take 1 tablet (75 mg total) by mouth daily.   colchicine 0.6 MG tablet Commonly known as: Colcrys Take 1 tablet (0.6 mg total) by mouth daily.   sertraline 50 MG tablet Commonly known as: ZOLOFT Take 1 tablet (50 mg total) by mouth daily.        Objective:   BP 131/69     Pulse 60   Temp (!) 97.5 F (36.4 C) (Temporal)   Ht 5' 5" (1.651 m)   Wt 204 lb (92.5 kg)   SpO2 97%   BMI 33.95 kg/m   Wt Readings from Last 3 Encounters:  01/28/19 204 lb (92.5 kg)  09/10/18 205 lb 3.2 oz (93.1 kg)  07/29/18 205 lb (93 kg)    Physical Exam Vitals signs and nursing note reviewed.  Constitutional:      General: He is not in acute distress.    Appearance: He is well-developed. He is not diaphoretic.  Eyes:     General: No scleral icterus.    Conjunctiva/sclera: Conjunctivae normal.  Neck:     Musculoskeletal: Neck supple.     Thyroid: No  thyromegaly.  Cardiovascular:     Rate and Rhythm: Normal rate and regular rhythm.     Heart sounds: Normal heart sounds. No murmur.  Pulmonary:     Effort: Pulmonary effort is normal. No respiratory distress.     Breath sounds: Normal breath sounds. No wheezing.  Musculoskeletal: Normal range of motion.  Lymphadenopathy:     Cervical: No cervical adenopathy.  Skin:    General: Skin is warm and dry.     Findings: No rash.  Neurological:     Mental Status: He is alert and oriented to person, place, and time.     Coordination: Coordination normal.  Psychiatric:        Behavior: Behavior normal.       Assessment & Plan:   Problem List Items Addressed This Visit      Cardiovascular and Mediastinum   HTN (hypertension)   Relevant Orders   CMP14+EGFR     Other   Depression   Relevant Orders   CBC with Differential/Platelet   Hyperlipidemia - Primary   Relevant Orders   Lipid panel   Prediabetes   Relevant Orders   hgba1c    Other Visit Diagnoses    Prostate cancer screening       Relevant Orders   PSA, total and free      No change in current medication, will check blood work including A1c and evaluate from there, patient does not feel like his health is changed at all and his blood pressure seem to be controlled on current medication. Follow up plan: Return in about 6 months (around 07/28/2019), or if symptoms worsen or fail to improve, for Hypertension and prediabetes recheck.  Counseling provided for all of the vaccine components Orders Placed This Encounter  Procedures  . CBC with Differential/Platelet  . CMP14+EGFR  . Lipid panel  . hgba1c  . PSA, total and free    Caryl Pina, MD [facility] 01/28/2019, 8:26 AM

## 2019-01-29 LAB — CMP14+EGFR
ALT: 33 IU/L (ref 0–44)
AST: 21 IU/L (ref 0–40)
Albumin/Globulin Ratio: 2.4 — ABNORMAL HIGH (ref 1.2–2.2)
Albumin: 4.5 g/dL (ref 3.8–4.9)
Alkaline Phosphatase: 101 IU/L (ref 39–117)
BUN/Creatinine Ratio: 14 (ref 9–20)
BUN: 18 mg/dL (ref 6–24)
Bilirubin Total: 0.9 mg/dL (ref 0.0–1.2)
CO2: 21 mmol/L (ref 20–29)
Calcium: 9.4 mg/dL (ref 8.7–10.2)
Chloride: 101 mmol/L (ref 96–106)
Creatinine, Ser: 1.32 mg/dL — ABNORMAL HIGH (ref 0.76–1.27)
GFR calc Af Amer: 69 mL/min/{1.73_m2} (ref >59)
GFR calc non Af Amer: 59 mL/min/{1.73_m2} — ABNORMAL LOW (ref >59)
Globulin, Total: 1.9 g/dL (ref 1.5–4.5)
Glucose: 127 mg/dL — ABNORMAL HIGH (ref 65–99)
Potassium: 4.2 mmol/L (ref 3.5–5.2)
Sodium: 140 mmol/L (ref 134–144)
Total Protein: 6.4 g/dL (ref 6.0–8.5)

## 2019-01-29 LAB — CBC WITH DIFFERENTIAL/PLATELET
Basophils Absolute: 0 10*3/uL (ref 0.0–0.2)
Basos: 1 %
EOS (ABSOLUTE): 0.1 10*3/uL (ref 0.0–0.4)
Eos: 1 %
Hematocrit: 42 % (ref 37.5–51.0)
Hemoglobin: 14.2 g/dL (ref 13.0–17.7)
Immature Grans (Abs): 0 10*3/uL (ref 0.0–0.1)
Immature Granulocytes: 0 %
Lymphocytes Absolute: 1.7 10*3/uL (ref 0.7–3.1)
Lymphs: 33 %
MCH: 32.1 pg (ref 26.6–33.0)
MCHC: 33.8 g/dL (ref 31.5–35.7)
MCV: 95 fL (ref 79–97)
Monocytes Absolute: 0.4 10*3/uL (ref 0.1–0.9)
Monocytes: 7 %
Neutrophils Absolute: 2.9 10*3/uL (ref 1.4–7.0)
Neutrophils: 58 %
Platelets: 164 10*3/uL (ref 150–450)
RBC: 4.43 x10E6/uL (ref 4.14–5.80)
RDW: 13.2 % (ref 11.6–15.4)
WBC: 5 10*3/uL (ref 3.4–10.8)

## 2019-01-29 LAB — LIPID PANEL
Chol/HDL Ratio: 2.3 ratio (ref 0.0–5.0)
Cholesterol, Total: 75 mg/dL — ABNORMAL LOW (ref 100–199)
HDL: 32 mg/dL — ABNORMAL LOW (ref >39)
LDL Chol Calc (NIH): 28 mg/dL (ref 0–99)
Triglycerides: 69 mg/dL (ref 0–149)
VLDL Cholesterol Cal: 15 mg/dL (ref 5–40)

## 2019-01-29 LAB — PSA, TOTAL AND FREE
PSA, Free Pct: 66.7 %
PSA, Free: 0.2 ng/mL
Prostate Specific Ag, Serum: 0.3 ng/mL (ref 0.0–4.0)

## 2019-04-04 DIAGNOSIS — Z0120 Encounter for dental examination and cleaning without abnormal findings: Secondary | ICD-10-CM | POA: Diagnosis not present

## 2019-06-08 ENCOUNTER — Encounter: Payer: Self-pay | Admitting: Family Medicine

## 2019-06-08 ENCOUNTER — Telehealth (INDEPENDENT_AMBULATORY_CARE_PROVIDER_SITE_OTHER): Payer: Medicare Other | Admitting: Family Medicine

## 2019-06-08 DIAGNOSIS — M65332 Trigger finger, left middle finger: Secondary | ICD-10-CM | POA: Diagnosis not present

## 2019-06-08 MED ORDER — PREDNISONE 20 MG PO TABS: ORAL_TABLET | ORAL | 0 refills | Status: DC

## 2019-06-08 NOTE — Progress Notes (Signed)
Virtual Visit via MyChart video note  I connected with Bruce Guerrero on 06/08/19 at 0815 by video and verified that I am speaking with the correct person using two identifiers. Bruce Guerrero is currently located at home and no other people are currently with her during visit. The provider, Elige Radon Rylie Limburg, MD is located in their office at time of visit.  Call ended at 0825  I discussed the limitations, risks, security and privacy concerns of performing an evaluation and management service by video and the availability of in person appointments. I also discussed with the patient that there may be a patient responsible charge related to this service. The patient expressed understanding and agreed to proceed.   History and Present Illness: Patient is calling in for trigger on his left middle finger and it has been bothering him for over a year but has worsened over the past few weeks. He has had one other previously and had injection.  Patient denies any fevers or chills or redness or warmth or loss of range of motion just that it gets caught sometimes.    Outpatient Encounter Medications as of 06/08/2019  Medication Sig  . allopurinol (ZYLOPRIM) 300 MG tablet TAKE 1 TABLET BY MOUTH EVERY DAY  . amLODipine (NORVASC) 10 MG tablet Take 1 tablet (10 mg total) by mouth daily.  Marland Kitchen atorvastatin (LIPITOR) 80 MG tablet TAKE 1 TABLET BY MOUTH EVERY DAY AT 6PM  . carvedilol (COREG) 6.25 MG tablet Take 1 tablet (6.25 mg total) by mouth 2 (two) times daily with a meal.  . clopidogrel (PLAVIX) 75 MG tablet Take 1 tablet (75 mg total) by mouth daily.  . colchicine (COLCRYS) 0.6 MG tablet Take 1 tablet (0.6 mg total) by mouth daily.  . predniSONE (DELTASONE) 20 MG tablet Take 3 tabs daily for 1 week, then 2 tabs daily for week 2, then 1 tab daily for week 3.  . sertraline (ZOLOFT) 50 MG tablet Take 1 tablet (50 mg total) by mouth daily.   Facility-Administered Encounter Medications as of  06/08/2019  Medication  . 0.9 %  sodium chloride infusion    Review of Systems  Constitutional: Negative for chills and fever.  Respiratory: Negative for shortness of breath and wheezing.   Cardiovascular: Negative for chest pain and leg swelling.  Musculoskeletal: Positive for arthralgias. Negative for back pain and gait problem.  Skin: Negative for rash.  All other systems reviewed and are negative.   Observations/Objective: Patient sound comfortable and in no acute distress   Assessment and Plan: Problem List Items Addressed This Visit    None    Visit Diagnoses    Trigger middle finger of left hand    -  Primary   Relevant Medications   predniSONE (DELTASONE) 20 MG tablet      Will do short course of prednisone but ultimately he would like an injection and he has an in person appointment in May but I do not have anything available before then so he will come in then but do the prednisone in the meantime. Follow up plan: Return if symptoms worsen or fail to improve.     I discussed the assessment and treatment plan with the patient. The patient was provided an opportunity to ask questions and all were answered. The patient agreed with the plan and demonstrated an understanding of the instructions.   The patient was advised to call back or seek an in-person evaluation if the symptoms worsen or if the  condition fails to improve as anticipated.  The above assessment and management plan was discussed with the patient. The patient verbalized understanding of and has agreed to the management plan. Patient is aware to call the clinic if symptoms persist or worsen. Patient is aware when to return to the clinic for a follow-up visit. Patient educated on when it is appropriate to go to the emergency department.    I provided 10 minutes of non-face-to-face time during this encounter.    Worthy Rancher, MD

## 2019-06-22 ENCOUNTER — Other Ambulatory Visit: Payer: Self-pay | Admitting: Family Medicine

## 2019-06-22 DIAGNOSIS — M65332 Trigger finger, left middle finger: Secondary | ICD-10-CM

## 2019-07-27 ENCOUNTER — Other Ambulatory Visit: Payer: Self-pay | Admitting: Family Medicine

## 2019-07-27 DIAGNOSIS — E785 Hyperlipidemia, unspecified: Secondary | ICD-10-CM

## 2019-07-27 DIAGNOSIS — I10 Essential (primary) hypertension: Secondary | ICD-10-CM

## 2019-07-29 ENCOUNTER — Ambulatory Visit (INDEPENDENT_AMBULATORY_CARE_PROVIDER_SITE_OTHER): Payer: Medicare Other | Admitting: Family Medicine

## 2019-07-29 ENCOUNTER — Other Ambulatory Visit: Payer: Self-pay

## 2019-07-29 ENCOUNTER — Encounter: Payer: Self-pay | Admitting: Family Medicine

## 2019-07-29 VITALS — BP 105/59 | HR 54 | Temp 97.4°F | Ht 65.0 in | Wt 201.4 lb

## 2019-07-29 DIAGNOSIS — I10 Essential (primary) hypertension: Secondary | ICD-10-CM

## 2019-07-29 DIAGNOSIS — M65332 Trigger finger, left middle finger: Secondary | ICD-10-CM | POA: Diagnosis not present

## 2019-07-29 DIAGNOSIS — R7303 Prediabetes: Secondary | ICD-10-CM

## 2019-07-29 DIAGNOSIS — E782 Mixed hyperlipidemia: Secondary | ICD-10-CM

## 2019-07-29 LAB — CMP14+EGFR
ALT: 34 IU/L (ref 0–44)
AST: 24 IU/L (ref 0–40)
Albumin/Globulin Ratio: 1.8 (ref 1.2–2.2)
Albumin: 4.3 g/dL (ref 3.8–4.9)
Alkaline Phosphatase: 117 IU/L (ref 48–121)
BUN/Creatinine Ratio: 12 (ref 9–20)
BUN: 17 mg/dL (ref 6–24)
Bilirubin Total: 0.6 mg/dL (ref 0.0–1.2)
CO2: 23 mmol/L (ref 20–29)
Calcium: 9.5 mg/dL (ref 8.7–10.2)
Chloride: 101 mmol/L (ref 96–106)
Creatinine, Ser: 1.41 mg/dL — ABNORMAL HIGH (ref 0.76–1.27)
GFR calc Af Amer: 63 mL/min/{1.73_m2} (ref >59)
GFR calc non Af Amer: 55 mL/min/{1.73_m2} — ABNORMAL LOW (ref >59)
Globulin, Total: 2.4 g/dL (ref 1.5–4.5)
Glucose: 171 mg/dL — ABNORMAL HIGH (ref 65–99)
Potassium: 4.7 mmol/L (ref 3.5–5.2)
Sodium: 138 mmol/L (ref 134–144)
Total Protein: 6.7 g/dL (ref 6.0–8.5)

## 2019-07-29 LAB — CBC WITH DIFFERENTIAL/PLATELET
Basophils Absolute: 0 10*3/uL (ref 0.0–0.2)
Basos: 1 %
EOS (ABSOLUTE): 0 10*3/uL (ref 0.0–0.4)
Eos: 1 %
Hematocrit: 39.6 % (ref 37.5–51.0)
Hemoglobin: 12.9 g/dL — ABNORMAL LOW (ref 13.0–17.7)
Immature Grans (Abs): 0 10*3/uL (ref 0.0–0.1)
Immature Granulocytes: 1 %
Lymphocytes Absolute: 1.7 10*3/uL (ref 0.7–3.1)
Lymphs: 33 %
MCH: 30.7 pg (ref 26.6–33.0)
MCHC: 32.6 g/dL (ref 31.5–35.7)
MCV: 94 fL (ref 79–97)
Monocytes Absolute: 0.5 10*3/uL (ref 0.1–0.9)
Monocytes: 11 %
Neutrophils Absolute: 2.8 10*3/uL (ref 1.4–7.0)
Neutrophils: 53 %
Platelets: 184 10*3/uL (ref 150–450)
RBC: 4.2 x10E6/uL (ref 4.14–5.80)
RDW: 13.9 % (ref 11.6–15.4)
WBC: 5.1 10*3/uL (ref 3.4–10.8)

## 2019-07-29 LAB — LIPID PANEL
Chol/HDL Ratio: 3.2 ratio (ref 0.0–5.0)
Cholesterol, Total: 97 mg/dL — ABNORMAL LOW (ref 100–199)
HDL: 30 mg/dL — ABNORMAL LOW (ref >39)
LDL Chol Calc (NIH): 44 mg/dL (ref 0–99)
Triglycerides: 130 mg/dL (ref 0–149)
VLDL Cholesterol Cal: 23 mg/dL (ref 5–40)

## 2019-07-29 LAB — BAYER DCA HB A1C WAIVED: HB A1C (BAYER DCA - WAIVED): 8.9 % — ABNORMAL HIGH (ref <7.0)

## 2019-07-29 MED ORDER — BACLOFEN 10 MG PO TABS: 10.0000 mg | ORAL_TABLET | Freq: Three times a day (TID) | ORAL | 2 refills | Status: DC

## 2019-07-29 MED ORDER — METHYLPREDNISOLONE ACETATE 40 MG/ML IJ SUSP: 40.0000 mg | Freq: Once | INTRAMUSCULAR | Status: DC

## 2019-07-29 NOTE — Progress Notes (Signed)
BP (!) 105/59   Pulse (!) 54   Temp (!) 97.4 F (36.3 C) (Temporal)   Ht '5\' 5"'$  (1.651 m)   Wt 201 lb 6 oz (91.3 kg)   BMI 33.51 kg/m    Subjective:   Patient ID: Bruce Guerrero, male    DOB: [birth year], 59 y.o.   MRN: [id number]  HPI: Bruce Guerrero is a 59 y.o. male presenting on 07/29/2019 for Medical Management of Chronic Issues   HPI Hypertension Patient is currently on amlodipine and carvedilol, and their blood pressure today is 105/59. Patient denies any lightheadedness or dizziness. Patient denies headaches, blurred vision, chest pains, shortness of breath, or weakness. Denies any side effects from medication and is content with current medication.   Hyperlipidemia Patient is coming in for recheck of his hyperlipidemia. The patient is currently taking Lipitor. They deny any issues with myalgias or history of liver damage from it. They deny any focal numbness or weakness or chest pain.   Prediabetes Patient comes in today for recheck of his diabetes. Patient has been currently taking no medication or monitoring, will check A1c today. Patient is not currently on an ACE inhibitor/ARB. Patient has not seen an ophthalmologist this year. Patient denies any issues with their feet. The symptom started onset as an adult hypertension and hyperlipidemia ARE RELATED TO DM   Patient is coming in for follow-up on left hand trigger finger, he did have a prednisone pack for this which did help.  But it did not completely eradicate it now is taking Aleve and it is helping some but is still there on his middle finger on his left hand where it gets caught sometimes and painful in the palm of his hand when it does get caught.  Relevant past medical, surgical, family and social history reviewed and updated as indicated. Interim medical history since our last visit reviewed. Allergies and medications reviewed and updated.  Review of Systems  Constitutional: Negative for chills and fever.    Eyes: Negative for visual disturbance.  Respiratory: Negative for shortness of breath and wheezing.   Cardiovascular: Negative for chest pain and leg swelling.  Musculoskeletal: Negative for back pain and gait problem.  Skin: Negative for rash.  Neurological: Negative for dizziness, weakness and light-headedness.  All other systems reviewed and are negative.   Per HPI unless specifically indicated above   Allergies as of 07/29/2019   No Known Allergies     Medication List       Accurate as of Jul 29, 2019  8:35 AM. If you have any questions, ask your nurse or doctor.        STOP taking these medications   predniSONE 20 MG tablet Commonly known as: DELTASONE Stopped by: Fransisca Kaufmann Anzel Kearse, MD     TAKE these medications   allopurinol 300 MG tablet Commonly known as: ZYLOPRIM TAKE 1 TABLET BY MOUTH EVERY DAY   amLODipine 10 MG tablet Commonly known as: NORVASC Take 1 tablet (10 mg total) by mouth daily.   atorvastatin 80 MG tablet Commonly known as: LIPITOR TAKE 1 TABLET BY MOUTH EVERY DAY AT 6PM   carvedilol 6.25 MG tablet Commonly known as: COREG TAKE 1 TABLET (6.25 MG TOTAL) BY MOUTH 2 (TWO) TIMES DAILY WITH A MEAL.   clopidogrel 75 MG tablet Commonly known as: PLAVIX Take 1 tablet (75 mg total) by mouth daily.   colchicine 0.6 MG tablet TAKE 1 TABLET BY MOUTH EVERY DAY   sertraline 50 MG  tablet Commonly known as: ZOLOFT Take 1 tablet (50 mg total) by mouth daily.        Objective:   BP (!) 105/59   Pulse (!) 54   Temp (!) 97.4 F (36.3 C) (Temporal)   Ht '5\' 5"'$  (1.651 m)   Wt 201 lb 6 oz (91.3 kg)   BMI 33.51 kg/m   Wt Readings from Last 3 Encounters:  07/29/19 201 lb 6 oz (91.3 kg)  01/28/19 204 lb (92.5 kg)  09/10/18 205 lb 3.2 oz (93.1 kg)    Physical Exam Vitals and nursing note reviewed.  Constitutional:      General: He is not in acute distress.    Appearance: He is well-developed. He is not diaphoretic.  Eyes:     General: No  scleral icterus.    Conjunctiva/sclera: Conjunctivae normal.  Neck:     Thyroid: No thyromegaly.  Cardiovascular:     Rate and Rhythm: Normal rate and regular rhythm.     Heart sounds: Normal heart sounds. No murmur.  Pulmonary:     Effort: Pulmonary effort is normal. No respiratory distress.     Breath sounds: Normal breath sounds. No wheezing.  Musculoskeletal:        General: Normal range of motion.     Cervical back: Neck supple.  Lymphadenopathy:     Cervical: No cervical adenopathy.  Skin:    General: Skin is warm and dry.     Findings: No rash.  Neurological:     Mental Status: He is alert and oriented to person, place, and time.     Coordination: Coordination normal.  Psychiatric:        Behavior: Behavior normal.      Assessment & Plan:   Problem List Items Addressed This Visit      Cardiovascular and Mediastinum   HTN (hypertension) - Primary   Relevant Orders   CBC with Differential/Platelet   CMP14+EGFR     Other   Hyperlipidemia   Relevant Orders   Lipid panel   Prediabetes   Relevant Orders   Bayer DCA Hb A1c Waived    Other Visit Diagnoses    Trigger middle finger of left hand       Relevant Medications   methylPREDNISolone acetate (DEPO-MEDROL) injection 40 mg (Start on 07/29/2019  8:45 AM)      Left hand trigger finger injection: Consent form signed. Risk factors of bleeding and infection discussed with patient and patient is agreeable towards injection. Patient prepped with Betadine. Lateral approach towards injection used. Injected 40 mg of Depo-Medrol and 1 mL of 2% lidocaine. Patient tolerated procedure well and no side effects from noted. Minimal to no bleeding. Simple bandage applied after.  Follow up plan: Return in about 6 months (around 01/29/2020), or if symptoms worsen or fail to improve, for Hypertension and cholesterol recheck.  Counseling provided for all of the vaccine components No orders of the defined types were placed in this  encounter.   Caryl Pina, MD [facility] 07/29/2019, 8:35 AM

## 2019-08-01 ENCOUNTER — Ambulatory Visit: Payer: Medicare Other

## 2019-08-29 ENCOUNTER — Ambulatory Visit: Payer: Medicare Other

## 2019-08-30 ENCOUNTER — Ambulatory Visit (INDEPENDENT_AMBULATORY_CARE_PROVIDER_SITE_OTHER): Payer: Medicare Other

## 2019-08-30 DIAGNOSIS — Z0000 Encounter for general adult medical examination without abnormal findings: Secondary | ICD-10-CM

## 2019-08-30 NOTE — Progress Notes (Signed)
MEDICARE ANNUAL WELLNESS VISIT  08/30/2019  Telephone Visit Disclaimer This Medicare AWV was conducted by telephone due to national recommendations for restrictions regarding the COVID-19 Pandemic (e.g. social distancing).  I verified, using two identifiers, that I am speaking with Bruce Guerrero or their authorized healthcare agent. I discussed the limitations, risks, security, and privacy concerns of performing an evaluation and management service by telephone and the potential availability of an in-person appointment in the future. The patient expressed understanding and agreed to proceed.   Subjective:  Bruce Guerrero is a 59 y.o. male patient of Dettinger, Elige Radon, MD who had a Medicare Annual Wellness Visit today via telephone. Bruce Guerrero is Retired and lives with their son. he has two children. he reports that he is socially active and does interact with friends/family regularly. he is minimally physically active and enjoys .  Patient Care Team: Dettinger, Elige Radon, MD as PCP - General (Family Medicine)  Advanced Directives 08/30/2019 02/18/2016 04/20/2015 11/11/2013 02/08/2013 02/01/2013  Does Patient Have a Medical Advance Directive? Yes;No No No No Patient does not have advance directive;Patient would not like information Patient would not like information  Does patient want to make changes to medical advance directive? No - Patient declined - - - - -  Would patient like information on creating a medical advance directive? No - Patient declined - No - patient declined information No - patient declined information - -  Pre-existing out of facility DNR order (yellow form or pink MOST form) - - - - No No    Hospital Utilization Over the Past 12 Months: # of hospitalizations or ER visits: 0 # of surgeries: 0  Review of Systems    Patient reports that his overall health is unchanged compared to last year.    Patient Reported Readings (BP, Pulse, CBG, Weight,  etc) none  Pain Assessment Pain : No/denies pain     Current Medications & Allergies (verified) Allergies as of 08/30/2019   No Known Allergies     Medication List       Accurate as of August 30, 2019  2:43 PM. If you have any questions, ask your nurse or doctor.        STOP taking these medications   baclofen 10 MG tablet Commonly known as: LIORESAL     TAKE these medications   allopurinol 300 MG tablet Commonly known as: ZYLOPRIM TAKE 1 TABLET BY MOUTH EVERY DAY   amLODipine 10 MG tablet Commonly known as: NORVASC Take 1 tablet (10 mg total) by mouth daily.   atorvastatin 80 MG tablet Commonly known as: LIPITOR TAKE 1 TABLET BY MOUTH EVERY DAY AT 6PM   carvedilol 6.25 MG tablet Commonly known as: COREG TAKE 1 TABLET (6.25 MG TOTAL) BY MOUTH 2 (TWO) TIMES DAILY WITH A MEAL.   clopidogrel 75 MG tablet Commonly known as: PLAVIX Take 1 tablet (75 mg total) by mouth daily.   colchicine 0.6 MG tablet TAKE 1 TABLET BY MOUTH EVERY DAY   sertraline 50 MG tablet Commonly known as: ZOLOFT Take 1 tablet (50 mg total) by mouth daily.       History (reviewed): Past Medical History:  Diagnosis Date  . Gout   . Hyperlipidemia   . Hypertension   . Stroke [facility]) 2014   Past Surgical History:  Procedure Laterality Date  . NO PAST SURGERIES     Family History  Problem Relation Age of Onset  . COPD Mother   . COPD  Father   . Cancer Sister   . Colon cancer Sister        late 24's  . Hyperlipidemia Brother   . Diabetes Brother    Social History   Socioeconomic History  . Marital status: Single    Spouse name: Not on file  . Number of children: Not on file  . Years of education: Not on file  . Highest education level: Not on file  Occupational History  . Not on file  Tobacco Use  . Smoking status: Never Smoker  . Smokeless tobacco: Never Used  Substance and Sexual Activity  . Alcohol use: No  . Drug use: No  . Sexual activity: Not on file  Other  Topics Concern  . Not on file  Social History Narrative  . Not on file   Social Determinants of Health   Financial Resource Strain:   . Difficulty of Paying Living Expenses:   Food Insecurity:   . Worried About Charity fundraiser in the Last Year:   . Arboriculturist in the Last Year:   Transportation Needs:   . Film/video editor (Medical):   Marland Kitchen Lack of Transportation (Non-Medical):   Physical Activity:   . Days of Exercise per Week:   . Minutes of Exercise per Session:   Stress:   . Feeling of Stress :   Social Connections:   . Frequency of Communication with Friends and Family:   . Frequency of Social Gatherings with Friends and Family:   . Attends Religious Services:   . Active Member of Clubs or Organizations:   . Attends Archivist Meetings:   Marland Kitchen Marital Status:     Activities of Daily Living In your present state of health, do you have any difficulty performing the following activities: 08/30/2019  Hearing? N  Vision? N  Difficulty concentrating or making decisions? N  Walking or climbing stairs? N  Dressing or bathing? N  Doing errands, shopping? N  Preparing Food and eating ? N  Using the Toilet? N  In the past six months, have you accidently leaked urine? N  Do you have problems with loss of bowel control? N  Managing your Medications? N  Managing your Finances? N  Housekeeping or managing your Housekeeping? N  Some recent data might be hidden    Patient Education/ Literacy How often do you need to have someone help you when you read instructions, pamphlets, or other written materials from your doctor or pharmacy?: 1 - Never What is the last grade level you completed in school?: college  Exercise Current Exercise Habits: Structured exercise class, Type of exercise: treadmill;walking, Time (Minutes): 30, Frequency (Times/Week): 3, Weekly Exercise (Minutes/Week): 90  Diet Patient reports consuming 2 meals a day and 2 snack(s) a day Patient  reports that his primary diet is: Regular Patient reports that she does have regular access to food.   Depression Screen PHQ 2/9 Scores 08/30/2019 07/29/2019 01/28/2019 09/10/2018 07/29/2018 05/06/2018 04/29/2018  PHQ - 2 Score 0 2 0 0 0 1 2  PHQ- 9 Score - 2 - - - - 2     Fall Risk Fall Risk  07/29/2019 01/28/2019 09/10/2018 07/29/2018 05/06/2018  Falls in the past year? 0 0 0 0 0  Number falls in past yr: - - - - -  Injury with Fall? - - - - -  Comment - - - - -  Risk for fall due to : - - - - -  Objective:  Bruce Guerrero seemed alert and oriented and he participated appropriately during our telephone visit.  Blood Pressure Weight BMI  BP Readings from Last 3 Encounters:  07/29/19 (!) 105/59  01/28/19 131/69  09/10/18 (!) 110/56   Wt Readings from Last 3 Encounters:  07/29/19 201 lb 6 oz (91.3 kg)  01/28/19 204 lb (92.5 kg)  09/10/18 205 lb 3.2 oz (93.1 kg)   BMI Readings from Last 1 Encounters:  07/29/19 33.51 kg/m    *Unable to obtain current vital signs, weight, and BMI due to telephone visit type  Hearing/Vision  . Bruce Guerrero did not seem to have difficulty with hearing/understanding during the telephone conversation . Reports that he has not had a formal eye exam by an eye care professional within the past year . Reports that he has not had a formal hearing evaluation within the past year *Unable to fully assess hearing and vision during telephone visit type  Cognitive Function: 6CIT Screen 08/30/2019  What Year? 0 points  What month? 0 points  What time? 0 points  Count back from 20 0 points  Months in reverse 0 points  Repeat phrase 0 points  Total Score 0   (Normal:0-7, Significant for Dysfunction: >8)  Normal Cognitive Function Screening: Yes   Immunization & Health Maintenance Record Immunization History  Administered Date(s) Administered  . Influenza,inj,Quad PF,6+ Mos 02/02/2013, 01/11/2014, 12/22/2014, 12/31/2015, 12/22/2016, 12/23/2017, 11/30/2018   . Moderna SARS-COVID-2 Vaccination 05/26/2019, 06/24/2019  . Tdap 10/10/2013    Health Maintenance  Topic Date Due  . HIV Screening  01/28/2020 (Originally 03/25/1975)  . INFLUENZA VACCINE  10/09/2019  . COLONOSCOPY  02/17/2021  . TETANUS/TDAP  10/11/2023  . COVID-19 Vaccine  Completed  . Hepatitis C Screening  Completed       Assessment  This is a routine wellness examination for Bruce Guerrero.  Health Maintenance: Due or Overdue There are no preventive care reminders to display for this patient.  Bruce Guerrero does not need a referral for Community Assistance: Care Management:   no Social Work:    no Prescription Assistance:  no Nutrition/Diabetes Education:  no   Plan:  Personalized Goals Goals Addressed              This Visit's Progress     Patient Stated   .  Patient Stated (pt-stated)        Would like to raise his hemoglobin. Currently 12.9. Instructed him to eat iron rich food. Green leafy vegetables, fortified cereals, poultry, fish, red meats, dried fruits      Other   .  DIET - EAT MORE FRUITS AND VEGETABLES        Personalized Health Maintenance & Screening Recommendations    Lung Cancer Screening Recommended: no (Low Dose CT Chest recommended if Age 27-80 years, 30 pack-year currently smoking OR have quit w/in past 15 years) Hepatitis C Screening recommended: no HIV Screening recommended: no  Advanced Directives: Written information was not prepared per patient's request.  Referrals & Orders No orders of the defined types were placed in this encounter.   Follow-up Plan . Follow-up with Dettinger, Elige Radon, MD as planned . Schedule 01/30/2020    I have personally reviewed and noted the following in the patient's chart:   . Medical and social history . Use of alcohol, tobacco or illicit drugs  . Current medications and supplements . Functional ability and status . Nutritional status . Physical activity . Advanced  directives . List of  other physicians . Hospitalizations, surgeries, and ER visits in previous 12 months . Vitals . Screenings to include cognitive, depression, and falls . Referrals and appointments  In addition, I have reviewed and discussed with Bruce Guerrero certain preventive protocols, quality metrics, and best practice recommendations. A written personalized care plan for preventive services as well as general preventive health recommendations is available and can be mailed to the patient at his request.      Suzan Slick [facility]  [id number]

## 2019-08-30 NOTE — Patient Instructions (Signed)
  MEDICARE ANNUAL WELLNESS VISIT Health Maintenance Summary and Written Plan of Care  [name] ,  Thank you for allowing me to perform your Medicare Annual Wellness Visit and for your ongoing commitment to your health.   Health Maintenance & Immunization History Health Maintenance  Topic Date Due  . HIV Screening  01/28/2020 (Originally 03/25/1975)  . INFLUENZA VACCINE  10/09/2019  . COLONOSCOPY  02/17/2021  . TETANUS/TDAP  10/11/2023  . COVID-19 Vaccine  Completed  . Hepatitis C Screening  Completed   Immunization History  Administered Date(s) Administered  . Influenza,inj,Quad PF,6+ Mos 02/02/2013, 01/11/2014, 12/22/2014, 12/31/2015, 12/22/2016, 12/23/2017, 11/30/2018  . Moderna SARS-COVID-2 Vaccination 05/26/2019, 06/24/2019  . Tdap 10/10/2013    These are the patient goals that we discussed: Goals Addressed              This Visit's Progress     Patient Stated   .  Patient Stated (pt-stated)        Would like to raise his hemoglobin. Currently 12.9. Instructed him to eat iron rich food. Green leafy vegetables, fortified cereals, poultry, fish, red meats, dried fruits      Other   .  DIET - EAT MORE FRUITS AND VEGETABLES          This is a list of Health Maintenance Items that are overdue or due now: There are no preventive care reminders to display for this patient.   Orders/Referrals Placed Today: No orders of the defined types were placed in this encounter.  (Contact our referral department at [phone] if you have not spoken with someone about your referral appointment within the next 5 days)    Follow-up Plan  Scheduled with Dr. Louanne Skye 01/30/2020 at 7:55am.

## 2019-09-12 ENCOUNTER — Other Ambulatory Visit: Payer: Self-pay | Admitting: Family Medicine

## 2019-09-12 DIAGNOSIS — I10 Essential (primary) hypertension: Secondary | ICD-10-CM

## 2019-10-04 ENCOUNTER — Telehealth: Payer: Self-pay | Admitting: Family Medicine

## 2019-10-04 DIAGNOSIS — F411 Generalized anxiety disorder: Secondary | ICD-10-CM

## 2019-10-05 ENCOUNTER — Other Ambulatory Visit: Payer: Self-pay | Admitting: *Deleted

## 2019-10-05 DIAGNOSIS — F411 Generalized anxiety disorder: Secondary | ICD-10-CM

## 2019-10-05 MED ORDER — SERTRALINE HCL 50 MG PO TABS: 50.0000 mg | ORAL_TABLET | Freq: Every day | ORAL | 1 refills | Status: DC

## 2019-10-05 NOTE — Telephone Encounter (Signed)
Pt called = aware - APPT

## 2019-10-05 NOTE — Telephone Encounter (Signed)
Pt checking status on med refill 

## 2019-10-05 NOTE — Progress Notes (Signed)
appt  In NOV

## 2019-10-06 ENCOUNTER — Other Ambulatory Visit: Payer: Self-pay | Admitting: Family Medicine

## 2019-10-19 DIAGNOSIS — Z0120 Encounter for dental examination and cleaning without abnormal findings: Secondary | ICD-10-CM | POA: Diagnosis not present

## 2019-10-20 ENCOUNTER — Other Ambulatory Visit: Payer: Self-pay | Admitting: Family Medicine

## 2019-10-20 DIAGNOSIS — E785 Hyperlipidemia, unspecified: Secondary | ICD-10-CM

## 2019-10-20 DIAGNOSIS — I10 Essential (primary) hypertension: Secondary | ICD-10-CM

## 2019-12-01 ENCOUNTER — Ambulatory Visit (INDEPENDENT_AMBULATORY_CARE_PROVIDER_SITE_OTHER): Payer: Medicare Other | Admitting: *Deleted

## 2019-12-01 ENCOUNTER — Other Ambulatory Visit: Payer: Self-pay

## 2019-12-01 DIAGNOSIS — Z23 Encounter for immunization: Secondary | ICD-10-CM

## 2020-01-02 ENCOUNTER — Encounter: Payer: Self-pay | Admitting: Family Medicine

## 2020-01-02 ENCOUNTER — Ambulatory Visit (INDEPENDENT_AMBULATORY_CARE_PROVIDER_SITE_OTHER): Payer: Medicare Other | Admitting: Family Medicine

## 2020-01-02 ENCOUNTER — Other Ambulatory Visit: Payer: Self-pay

## 2020-01-02 VITALS — BP 125/66 | HR 54 | Temp 98.0°F | Ht 65.0 in | Wt 202.0 lb

## 2020-01-02 DIAGNOSIS — Z8673 Personal history of transient ischemic attack (TIA), and cerebral infarction without residual deficits: Secondary | ICD-10-CM

## 2020-01-02 DIAGNOSIS — R29898 Other symptoms and signs involving the musculoskeletal system: Secondary | ICD-10-CM

## 2020-01-02 DIAGNOSIS — G8110 Spastic hemiplegia affecting unspecified side: Secondary | ICD-10-CM | POA: Diagnosis not present

## 2020-01-02 NOTE — Progress Notes (Signed)
BP 125/66   Pulse (!) 54   Temp 98 F (36.7 C)   Ht 5\' 5"  (1.651 m)   Wt 202 lb (91.6 kg)   SpO2 97%   BMI 33.61 kg/m    Subjective:   Patient ID: , male    DOB: 05-23-1960, 59 y.o.   MRN: [id number]  HPI: Bruce Guerrero is a 59 y.o. male presenting on 01/02/2020 for Medical Management of Chronic Issues   HPI Patient is coming in for disability forms for her student loans for son, he has a history of a stroke and right-sided weakness almost completely weak on his right arm and 50% weak on his right leg which affects balance and ability none ability to use his hands for even sitting jobs. He is disabled understand the form signed for his 01/04/2020.  Relevant past medical, surgical, family and social history reviewed and updated as indicated. Interim medical history since our last visit reviewed. Allergies and medications reviewed and updated.  Review of Systems  Constitutional: Negative for chills and fever.  Respiratory: Negative for shortness of breath and wheezing.   Cardiovascular: Negative for chest pain and leg swelling.  Musculoskeletal: Positive for gait problem. Negative for arthralgias and back pain.  Skin: Negative for rash.  All other systems reviewed and are negative.   Per HPI unless specifically indicated above   Allergies as of 01/02/2020   No Known Allergies     Medication List       Accurate as of January 02, 2020 10:19 AM. If you have any questions, ask your nurse or doctor.        allopurinol 300 MG tablet Commonly known as: ZYLOPRIM TAKE 1 TABLET BY MOUTH EVERY DAY   amLODipine 10 MG tablet Commonly known as: NORVASC TAKE 1 TABLET BY MOUTH EVERY DAY   atorvastatin 80 MG tablet Commonly known as: LIPITOR TAKE 1 TABLET BY MOUTH EVERY DAY AT 6PM   carvedilol 6.25 MG tablet Commonly known as: COREG TAKE 1 TABLET (6.25 MG TOTAL) BY MOUTH 2 (TWO) TIMES DAILY WITH A MEAL.   clopidogrel 75 MG tablet Commonly  known as: PLAVIX TAKE 1 TABLET BY MOUTH EVERY DAY   colchicine 0.6 MG tablet TAKE 1 TABLET BY MOUTH EVERY DAY   sertraline 50 MG tablet Commonly known as: ZOLOFT Take 1 tablet (50 mg total) by mouth daily.        Objective:   BP 125/66   Pulse (!) 54   Temp 98 F (36.7 C)   Ht 5\' 5"  (1.651 m)   Wt 202 lb (91.6 kg)   SpO2 97%   BMI 33.61 kg/m   Wt Readings from Last 3 Encounters:  01/02/20 202 lb (91.6 kg)  07/29/19 201 lb 6 oz (91.3 kg)  01/28/19 204 lb (92.5 kg)    Physical Exam Vitals and nursing note reviewed.  Constitutional:      General: He is not in acute distress.    Appearance: He is well-developed. He is not diaphoretic.  Eyes:     General: No scleral icterus.    Conjunctiva/sclera: Conjunctivae normal.  Neck:     Thyroid: No thyromegaly.  Musculoskeletal:     Comments: 2 out of 5 strength in right upper extremity and 3 out of 5 strength in right lower extremity.  Skin:    General: Skin is warm and dry.     Findings: No rash.  Neurological:     Mental Status: He  is alert and oriented to person, place, and time.     Coordination: Coordination normal.  Psychiatric:        Behavior: Behavior normal.       Assessment & Plan:   Problem List Items Addressed This Visit      Nervous and Auditory   Weakness of right upper extremity   Spastic hemiplegia affecting dominant side (HCC) - Primary     Other   History of stroke      Will put in for disability paperwork that was signed for his sons insurance, patient already is legally disabled. Follow up plan: Return if symptoms worsen or fail to improve.  Counseling provided for all of the vaccine components No orders of the defined types were placed in this encounter.   Arville Care, MD [facility] 01/02/2020, 10:19 AM

## 2020-01-17 ENCOUNTER — Other Ambulatory Visit: Payer: Self-pay | Admitting: Family Medicine

## 2020-01-17 DIAGNOSIS — E785 Hyperlipidemia, unspecified: Secondary | ICD-10-CM

## 2020-01-17 DIAGNOSIS — I10 Essential (primary) hypertension: Secondary | ICD-10-CM

## 2020-01-19 ENCOUNTER — Encounter: Payer: Self-pay | Admitting: *Deleted

## 2020-01-30 ENCOUNTER — Other Ambulatory Visit: Payer: Self-pay

## 2020-01-30 ENCOUNTER — Encounter: Payer: Self-pay | Admitting: Family Medicine

## 2020-01-30 ENCOUNTER — Ambulatory Visit (INDEPENDENT_AMBULATORY_CARE_PROVIDER_SITE_OTHER): Payer: Medicare Other | Admitting: Family Medicine

## 2020-01-30 VITALS — BP 130/62 | HR 63 | Temp 98.0°F | Ht 65.0 in | Wt 204.0 lb

## 2020-01-30 DIAGNOSIS — E785 Hyperlipidemia, unspecified: Secondary | ICD-10-CM

## 2020-01-30 DIAGNOSIS — F411 Generalized anxiety disorder: Secondary | ICD-10-CM | POA: Diagnosis not present

## 2020-01-30 DIAGNOSIS — I10 Essential (primary) hypertension: Secondary | ICD-10-CM

## 2020-01-30 DIAGNOSIS — R7303 Prediabetes: Secondary | ICD-10-CM

## 2020-01-30 DIAGNOSIS — M1A0420 Idiopathic chronic gout, left hand, without tophus (tophi): Secondary | ICD-10-CM

## 2020-01-30 LAB — BAYER DCA HB A1C WAIVED: HB A1C (BAYER DCA - WAIVED): 6 % (ref <7.0)

## 2020-01-30 MED ORDER — COLCHICINE 0.6 MG PO TABS
0.6000 mg | ORAL_TABLET | Freq: Every day | ORAL | 3 refills | Status: DC
Start: 2020-01-30 — End: 2021-03-25

## 2020-01-30 MED ORDER — CARVEDILOL 6.25 MG PO TABS: 6.2500 mg | ORAL_TABLET | Freq: Two times a day (BID) | ORAL | 3 refills | Status: DC

## 2020-01-30 MED ORDER — CLOPIDOGREL BISULFATE 75 MG PO TABS
75.0000 mg | ORAL_TABLET | Freq: Every day | ORAL | 3 refills | Status: DC
Start: 2020-01-30 — End: 2021-02-18

## 2020-01-30 MED ORDER — ATORVASTATIN CALCIUM 80 MG PO TABS: ORAL_TABLET | ORAL | 3 refills | Status: DC

## 2020-01-30 MED ORDER — ALLOPURINOL 300 MG PO TABS
300.0000 mg | ORAL_TABLET | Freq: Every day | ORAL | 3 refills | Status: DC
Start: 2020-01-30 — End: 2021-03-26

## 2020-01-30 MED ORDER — AMLODIPINE BESYLATE 10 MG PO TABS: 10.0000 mg | ORAL_TABLET | Freq: Every day | ORAL | 3 refills | Status: DC

## 2020-01-30 MED ORDER — SERTRALINE HCL 50 MG PO TABS: 50.0000 mg | ORAL_TABLET | Freq: Every day | ORAL | 3 refills | Status: DC

## 2020-01-30 NOTE — Progress Notes (Signed)
BP 130/62   Pulse 63   Temp 98 F (36.7 C)   Ht $R'5\' 5"'yN$  (1.651 m)   Wt 204 lb (92.5 kg)   SpO2 98%   BMI 33.95 kg/m    Subjective:   Patient ID: Bruce Guerrero, male    DOB: 18-Dec-1960, 59 y.o.   MRN: [id number]  HPI: Bruce Guerrero is a 59 y.o. male presenting on 01/30/2020 for Medical Management of Chronic Issues, Hyperlipidemia, and Hypertension   HPI Type 2 diabetes mellitus Patient comes in today for recheck of his diabetes. Patient has been currently taking no medication currently we are monitoring, will do A1c today.. Patient is not currently on an ACE inhibitor/ARB. Patient has seen an ophthalmologist this year. Patient denies any issues with their feet. The symptom started onset as an adult history of CVA and hypertension and hyperlipidemia ARE RELATED TO DM   Hypertension Patient is currently on amlodipine and carvedilol, and their blood pressure today is 130/62. Patient denies any lightheadedness or dizziness. Patient denies headaches, blurred vision, chest pains, shortness of breath, or weakness. Denies any side effects from medication and is content with current medication.   Hyperlipidemia Patient is coming in for recheck of his hyperlipidemia. The patient is currently taking atorvastatin. They deny any issues with myalgias or history of liver damage from it. They deny any focal numbness or weakness or chest pain.   Gout Last attack: More than a year Attacks this year: 0 Medication: Allopurinol Location of attacks: Feet  Relevant past medical, surgical, family and social history reviewed and updated as indicated. Interim medical history since our last visit reviewed. Allergies and medications reviewed and updated.  Review of Systems  Constitutional: Negative for chills and fever.  Eyes: Negative for visual disturbance.  Respiratory: Negative for shortness of breath and wheezing.   Cardiovascular: Negative for chest pain and leg swelling.    Musculoskeletal: Negative for back pain and gait problem.  Skin: Negative for rash.  Neurological: Negative for dizziness, weakness and numbness.  All other systems reviewed and are negative.   Per HPI unless specifically indicated above   Allergies as of 01/30/2020   No Known Allergies     Medication List       Accurate as of January 30, 2020  9:06 AM. If you have any questions, ask your nurse or doctor.        allopurinol 300 MG tablet Commonly known as: ZYLOPRIM TAKE 1 TABLET BY MOUTH EVERY DAY   amLODipine 10 MG tablet Commonly known as: NORVASC TAKE 1 TABLET BY MOUTH EVERY DAY   atorvastatin 80 MG tablet Commonly known as: LIPITOR TAKE 1 TABLET BY MOUTH EVERY DAY AT 6PM   carvedilol 6.25 MG tablet Commonly known as: COREG TAKE 1 TABLET (6.25 MG TOTAL) BY MOUTH 2 (TWO) TIMES DAILY WITH A MEAL.   clopidogrel 75 MG tablet Commonly known as: PLAVIX TAKE 1 TABLET BY MOUTH EVERY DAY   colchicine 0.6 MG tablet TAKE 1 TABLET BY MOUTH EVERY DAY   sertraline 50 MG tablet Commonly known as: ZOLOFT Take 1 tablet (50 mg total) by mouth daily.        Objective:   BP 130/62   Pulse 63   Temp 98 F (36.7 C)   Ht $R'5\' 5"'jx$  (1.651 m)   Wt 204 lb (92.5 kg)   SpO2 98%   BMI 33.95 kg/m   Wt Readings from Last 3 Encounters:  01/30/20 204 lb (92.5 kg)  01/02/20  202 lb (91.6 kg)  07/29/19 201 lb 6 oz (91.3 kg)    Physical Exam Vitals and nursing note reviewed.  Constitutional:      General: He is not in acute distress.    Appearance: He is well-developed. He is not diaphoretic.  Eyes:     General: No scleral icterus.    Conjunctiva/sclera: Conjunctivae normal.  Neck:     Thyroid: No thyromegaly.  Cardiovascular:     Rate and Rhythm: Normal rate and regular rhythm.     Heart sounds: Normal heart sounds. No murmur heard.   Pulmonary:     Effort: Pulmonary effort is normal. No respiratory distress.     Breath sounds: Normal breath sounds. No wheezing.   Abdominal:     General: Abdomen is flat. Bowel sounds are normal. There is no distension.     Tenderness: There is no abdominal tenderness. There is no right CVA tenderness, left CVA tenderness or guarding.  Musculoskeletal:        General: Normal range of motion.     Cervical back: Neck supple.     Comments: Right-sided weakness, affects gait, no change  Lymphadenopathy:     Cervical: No cervical adenopathy.  Skin:    General: Skin is warm and dry.     Findings: No rash.  Neurological:     Mental Status: He is alert and oriented to person, place, and time.     Coordination: Coordination normal.  Psychiatric:        Behavior: Behavior normal.       Assessment & Plan:   Problem List Items Addressed This Visit      Cardiovascular and Mediastinum   HTN (hypertension)   Relevant Medications   amLODipine (NORVASC) 10 MG tablet   atorvastatin (LIPITOR) 80 MG tablet   carvedilol (COREG) 6.25 MG tablet     Other   Gout   Hyperlipidemia   Relevant Medications   amLODipine (NORVASC) 10 MG tablet   atorvastatin (LIPITOR) 80 MG tablet   carvedilol (COREG) 6.25 MG tablet   Other Relevant Orders   CMP14+EGFR   Lipid panel   Prediabetes - Primary   Relevant Orders   Bayer DCA Hb A1c Waived    Other Visit Diagnoses    Essential hypertension       Relevant Medications   amLODipine (NORVASC) 10 MG tablet   atorvastatin (LIPITOR) 80 MG tablet   carvedilol (COREG) 6.25 MG tablet   GAD (generalized anxiety disorder)       Relevant Medications   sertraline (ZOLOFT) 50 MG tablet   Other Relevant Orders   CBC with Differential/Platelet      Continue current medication, no change. Follow up plan: Return in about 6 months (around 07/29/2020), or if symptoms worsen or fail to improve, for Prediabetes and hyperlipidemia and hypertension.  Counseling provided for all of the vaccine components No orders of the defined types were placed in this encounter.   Caryl Pina,  MD [facility] 01/30/2020, 9:06 AM

## 2020-01-31 LAB — CMP14+EGFR
ALT: 27 IU/L (ref 0–44)
AST: 18 IU/L (ref 0–40)
Albumin/Globulin Ratio: 2.1 (ref 1.2–2.2)
Albumin: 4.5 g/dL (ref 3.8–4.9)
Alkaline Phosphatase: 119 IU/L (ref 44–121)
BUN/Creatinine Ratio: 11 (ref 9–20)
BUN: 14 mg/dL (ref 6–24)
Bilirubin Total: 0.5 mg/dL (ref 0.0–1.2)
CO2: 23 mmol/L (ref 20–29)
Calcium: 9.3 mg/dL (ref 8.7–10.2)
Chloride: 101 mmol/L (ref 96–106)
Creatinine, Ser: 1.33 mg/dL — ABNORMAL HIGH (ref 0.76–1.27)
GFR calc Af Amer: 68 mL/min/{1.73_m2} (ref >59)
GFR calc non Af Amer: 59 mL/min/{1.73_m2} — ABNORMAL LOW (ref >59)
Globulin, Total: 2.1 g/dL (ref 1.5–4.5)
Glucose: 143 mg/dL — ABNORMAL HIGH (ref 65–99)
Potassium: 4 mmol/L (ref 3.5–5.2)
Sodium: 139 mmol/L (ref 134–144)
Total Protein: 6.6 g/dL (ref 6.0–8.5)

## 2020-01-31 LAB — CBC WITH DIFFERENTIAL/PLATELET
Basophils Absolute: 0 10*3/uL (ref 0.0–0.2)
Basos: 1 %
EOS (ABSOLUTE): 0.1 10*3/uL (ref 0.0–0.4)
Eos: 2 %
Hematocrit: 36.9 % — ABNORMAL LOW (ref 37.5–51.0)
Hemoglobin: 12.1 g/dL — ABNORMAL LOW (ref 13.0–17.7)
Immature Grans (Abs): 0 10*3/uL (ref 0.0–0.1)
Immature Granulocytes: 0 %
Lymphocytes Absolute: 1.7 10*3/uL (ref 0.7–3.1)
Lymphs: 26 %
MCH: 28.7 pg (ref 26.6–33.0)
MCHC: 32.8 g/dL (ref 31.5–35.7)
MCV: 87 fL (ref 79–97)
Monocytes Absolute: 0.4 10*3/uL (ref 0.1–0.9)
Monocytes: 6 %
Neutrophils Absolute: 4.3 10*3/uL (ref 1.4–7.0)
Neutrophils: 65 %
Platelets: 214 10*3/uL (ref 150–450)
RBC: 4.22 x10E6/uL (ref 4.14–5.80)
RDW: 14.5 % (ref 11.6–15.4)
WBC: 6.5 10*3/uL (ref 3.4–10.8)

## 2020-01-31 LAB — LIPID PANEL
Chol/HDL Ratio: 2.9 ratio (ref 0.0–5.0)
Cholesterol, Total: 90 mg/dL — ABNORMAL LOW (ref 100–199)
HDL: 31 mg/dL — ABNORMAL LOW (ref >39)
LDL Chol Calc (NIH): 41 mg/dL (ref 0–99)
Triglycerides: 94 mg/dL (ref 0–149)
VLDL Cholesterol Cal: 18 mg/dL (ref 5–40)

## 2020-03-21 DIAGNOSIS — D044 Carcinoma in situ of skin of scalp and neck: Secondary | ICD-10-CM | POA: Diagnosis not present

## 2020-03-21 DIAGNOSIS — L570 Actinic keratosis: Secondary | ICD-10-CM | POA: Diagnosis not present

## 2020-03-21 DIAGNOSIS — D485 Neoplasm of uncertain behavior of skin: Secondary | ICD-10-CM | POA: Diagnosis not present

## 2020-03-21 DIAGNOSIS — C4442 Squamous cell carcinoma of skin of scalp and neck: Secondary | ICD-10-CM | POA: Diagnosis not present

## 2020-04-05 DIAGNOSIS — C4442 Squamous cell carcinoma of skin of scalp and neck: Secondary | ICD-10-CM | POA: Diagnosis not present

## 2020-06-15 ENCOUNTER — Encounter: Payer: Self-pay | Admitting: *Deleted

## 2020-07-30 ENCOUNTER — Other Ambulatory Visit: Payer: Self-pay

## 2020-07-30 ENCOUNTER — Encounter: Payer: Self-pay | Admitting: Family Medicine

## 2020-07-30 ENCOUNTER — Ambulatory Visit (INDEPENDENT_AMBULATORY_CARE_PROVIDER_SITE_OTHER): Payer: Medicare Other | Admitting: Family Medicine

## 2020-07-30 VITALS — BP 121/63 | HR 55 | Ht 65.0 in | Wt 206.0 lb

## 2020-07-30 DIAGNOSIS — R7303 Prediabetes: Secondary | ICD-10-CM

## 2020-07-30 DIAGNOSIS — W57XXXA Bitten or stung by nonvenomous insect and other nonvenomous arthropods, initial encounter: Secondary | ICD-10-CM

## 2020-07-30 DIAGNOSIS — F3341 Major depressive disorder, recurrent, in partial remission: Secondary | ICD-10-CM

## 2020-07-30 DIAGNOSIS — S30860A Insect bite (nonvenomous) of lower back and pelvis, initial encounter: Secondary | ICD-10-CM

## 2020-07-30 DIAGNOSIS — I10 Essential (primary) hypertension: Secondary | ICD-10-CM

## 2020-07-30 DIAGNOSIS — E782 Mixed hyperlipidemia: Secondary | ICD-10-CM

## 2020-07-30 LAB — BAYER DCA HB A1C WAIVED: HB A1C (BAYER DCA - WAIVED): 6.2 % (ref <7.0)

## 2020-07-30 MED ORDER — TRIAMCINOLONE ACETONIDE 0.1 % EX CREA: 1.0000 "application " | TOPICAL_CREAM | Freq: Two times a day (BID) | CUTANEOUS | 0 refills | Status: DC

## 2020-07-30 NOTE — Progress Notes (Signed)
BP 121/63   Pulse (!) 55   Ht _0  (1.651 m)   Wt 206 lb (93.4 kg)   SpO2 96%   BMI 34.28 kg/m    Subjective:   Patient ID: Bruce Guerrero, male    DOB: Jan 10, 1961, 60 y.o.   MRN: [id number]  HPI: Bruce Guerrero is a 60 y.o. male presenting on 07/30/2020 for Medical Management of Chronic Issues, Hypertension, and Hyperlipidemia   HPI Prediabetes Patient comes in today for recheck of his diabetes. Patient has been currently taking no medication currently and has been diet controlled. Patient is not currently on an ACE inhibitor/ARB. Patient has not seen an ophthalmologist this year. Patient denies any issues with their feet. The symptom started onset as an adult hypertension and hyperlipidemia ARE RELATED TO DM   Hypertension Patient is currently on amlodipine and carvedilol, and their blood pressure today is 121/63. Patient denies any lightheadedness or dizziness. Patient denies headaches, blurred vision, chest pains, shortness of breath, or weakness. Denies any side effects from medication and is content with current medication.   Hyperlipidemia Patient is coming in for recheck of his hyperlipidemia. The patient is currently taking atorvastatin. They deny any issues with myalgias or history of liver damage from it. They deny any focal numbness or weakness or chest pain.   Depression recheck Zoloft has been taken by him currently and he says he is doing well and has not changed and is very happy with the medicine.  Denies any suicidal ideations or thoughts of hurting self. Depression screen [facility] 2/9 07/30/2020 01/30/2020 01/02/2020 08/30/2019 07/29/2019  Decreased Interest 0 0 0 0 2  Down, Depressed, Hopeless 0 0 0 0 0  PHQ - 2 Score 0 0 0 0 2  Altered sleeping - - - - 0  Tired, decreased energy - - - - 0  Change in appetite - - - - 0  Feeling bad or failure about yourself  - - - - 0  Trouble concentrating - - - - 0  Moving slowly or fidgety/restless - - - - 0  Suicidal  thoughts - - - - 0  PHQ-9 Score - - - - 2  Difficult doing work/chores - - - - Not difficult at all    Patient gets the occasional bug bite and was given triamcinolone cream before and he just uses it very rarely for the itch.  He is wondering if he can get a refill on this.  Relevant past medical, surgical, family and social history reviewed and updated as indicated. Interim medical history since our last visit reviewed. Allergies and medications reviewed and updated.  Review of Systems  Constitutional: Negative for chills and fever.  Respiratory: Negative for shortness of breath and wheezing.   Cardiovascular: Negative for chest pain and leg swelling.  Musculoskeletal: Negative for back pain and gait problem.  Skin: Positive for rash (Gets itchy bug bites occasionally especially on his back).  Neurological: Positive for weakness (Hemiplegic, chronic). Negative for dizziness and numbness.  All other systems reviewed and are negative.   Per HPI unless specifically indicated above   Allergies as of 07/30/2020   No Known Allergies     Medication List       Accurate as of Jul 30, 2020  9:04 AM. If you have any questions, ask your nurse or doctor.        allopurinol 300 MG tablet Commonly known as: ZYLOPRIM Take 1 tablet (300 mg total) by mouth daily.  amLODipine 10 MG tablet Commonly known as: NORVASC Take 1 tablet (10 mg total) by mouth daily.   atorvastatin 80 MG tablet Commonly known as: LIPITOR One tablet everyday at 6pm   carvedilol 6.25 MG tablet Commonly known as: COREG Take 1 tablet (6.25 mg total) by mouth 2 (two) times daily with a meal.   clopidogrel 75 MG tablet Commonly known as: PLAVIX Take 1 tablet (75 mg total) by mouth daily.   colchicine 0.6 MG tablet Take 1 tablet (0.6 mg total) by mouth daily.   sertraline 50 MG tablet Commonly known as: ZOLOFT Take 1 tablet (50 mg total) by mouth daily.   triamcinolone cream 0.1 % Commonly known as:  KENALOG Apply 1 application topically 2 (two) times daily. Started by: Fransisca Kaufmann Jveon Pound, MD        Objective:   BP 121/63   Pulse (!) 55   Ht _0  (1.651 m)   Wt 206 lb (93.4 kg)   SpO2 96%   BMI 34.28 kg/m   Wt Readings from Last 3 Encounters:  07/30/20 206 lb (93.4 kg)  01/30/20 204 lb (92.5 kg)  01/02/20 202 lb (91.6 kg)    Physical Exam Vitals and nursing note reviewed.  Constitutional:      General: He is not in acute distress.    Appearance: He is well-developed. He is not diaphoretic.  Eyes:     General: No scleral icterus.    Conjunctiva/sclera: Conjunctivae normal.  Neck:     Thyroid: No thyromegaly.  Cardiovascular:     Rate and Rhythm: Normal rate and regular rhythm.     Heart sounds: Normal heart sounds. No murmur heard.   Pulmonary:     Effort: Pulmonary effort is normal. No respiratory distress.     Breath sounds: Normal breath sounds. No wheezing.  Abdominal:     General: Abdomen is flat. Bowel sounds are normal. There is no distension.     Palpations: Abdomen is soft.     Tenderness: There is no abdominal tenderness.     Hernia: A hernia (Left lower ventral hernia, reducible and nontender) is present.  Musculoskeletal:     Cervical back: Neck supple.  Lymphadenopathy:     Cervical: No cervical adenopathy.  Skin:    General: Skin is warm and dry.     Findings: No rash.  Neurological:     Mental Status: He is alert and oriented to person, place, and time.     Coordination: Coordination normal.  Psychiatric:        Behavior: Behavior normal.       Assessment & Plan:   Problem List Items Addressed This Visit      Cardiovascular and Mediastinum   HTN (hypertension)   Relevant Orders   CMP14+EGFR     Other   Depression   Relevant Orders   CBC with Differential/Platelet   Hyperlipidemia   Relevant Orders   Lipid panel   Prediabetes - Primary   Relevant Orders   CBC with Differential/Platelet   Bayer DCA Hb A1c Waived     Other Visit Diagnoses    Insect bite of lower back, initial encounter       Relevant Medications   triamcinolone cream (KENALOG) 0.1 %    Give triamcinolone to help with his occasional bug bites to help with the itch.  We will check blood work today, continue current medication.  Follow up plan: Return in about 6 months (around 01/30/2021), or if symptoms worsen or  fail to improve, for Hypertension and hyperlipidemia and physical.  Counseling provided for all of the vaccine components Orders Placed This Encounter  Procedures  . CBC with Differential/Platelet  . CMP14+EGFR  . Lipid panel  . B[facility] Hb A1c [place], MD [facility] 07/30/2020, 9:04 AM

## 2020-07-31 ENCOUNTER — Ambulatory Visit: Payer: Medicare Other | Admitting: Family Medicine

## 2020-07-31 LAB — CBC WITH DIFFERENTIAL/PLATELET
Basophils Absolute: 0 10*3/uL (ref 0.0–0.2)
Basos: 1 %
EOS (ABSOLUTE): 0.1 10*3/uL (ref 0.0–0.4)
Eos: 1 %
Hematocrit: 44.4 % (ref 37.5–51.0)
Hemoglobin: 15.1 g/dL (ref 13.0–17.7)
Immature Grans (Abs): 0 10*3/uL (ref 0.0–0.1)
Immature Granulocytes: 0 %
Lymphocytes Absolute: 1.7 10*3/uL (ref 0.7–3.1)
Lymphs: 30 %
MCH: 31.8 pg (ref 26.6–33.0)
MCHC: 34 g/dL (ref 31.5–35.7)
MCV: 94 fL (ref 79–97)
Monocytes Absolute: 0.4 10*3/uL (ref 0.1–0.9)
Monocytes: 7 %
Neutrophils Absolute: 3.3 10*3/uL (ref 1.4–7.0)
Neutrophils: 61 %
Platelets: 174 10*3/uL (ref 150–450)
RBC: 4.75 x10E6/uL (ref 4.14–5.80)
RDW: 13.1 % (ref 11.6–15.4)
WBC: 5.5 10*3/uL (ref 3.4–10.8)

## 2020-07-31 LAB — CMP14+EGFR
ALT: 29 IU/L (ref 0–44)
AST: 18 IU/L (ref 0–40)
Albumin/Globulin Ratio: 2.4 — ABNORMAL HIGH (ref 1.2–2.2)
Albumin: 4.6 g/dL (ref 3.8–4.9)
Alkaline Phosphatase: 126 IU/L — ABNORMAL HIGH (ref 44–121)
BUN/Creatinine Ratio: 14 (ref 9–20)
BUN: 18 mg/dL (ref 6–24)
Bilirubin Total: 0.5 mg/dL (ref 0.0–1.2)
CO2: 22 mmol/L (ref 20–29)
Calcium: 9.6 mg/dL (ref 8.7–10.2)
Chloride: 106 mmol/L (ref 96–106)
Creatinine, Ser: 1.26 mg/dL (ref 0.76–1.27)
Globulin, Total: 1.9 g/dL (ref 1.5–4.5)
Glucose: 164 mg/dL — ABNORMAL HIGH (ref 65–99)
Potassium: 4.2 mmol/L (ref 3.5–5.2)
Sodium: 143 mmol/L (ref 134–144)
Total Protein: 6.5 g/dL (ref 6.0–8.5)
eGFR: 66 mL/min/{1.73_m2} (ref >59)

## 2020-07-31 LAB — LIPID PANEL
Chol/HDL Ratio: 3.1 ratio (ref 0.0–5.0)
Cholesterol, Total: 102 mg/dL (ref 100–199)
HDL: 33 mg/dL — ABNORMAL LOW (ref >39)
LDL Chol Calc (NIH): 50 mg/dL (ref 0–99)
Triglycerides: 98 mg/dL (ref 0–149)
VLDL Cholesterol Cal: 19 mg/dL (ref 5–40)

## 2020-08-13 DIAGNOSIS — L570 Actinic keratosis: Secondary | ICD-10-CM | POA: Diagnosis not present

## 2020-09-18 ENCOUNTER — Ambulatory Visit (INDEPENDENT_AMBULATORY_CARE_PROVIDER_SITE_OTHER): Payer: Medicare Other

## 2020-09-18 VITALS — Ht 65.0 in | Wt 200.0 lb

## 2020-09-18 DIAGNOSIS — Z0000 Encounter for general adult medical examination without abnormal findings: Secondary | ICD-10-CM

## 2020-09-18 NOTE — Patient Instructions (Signed)
[name] , Thank you for taking time to come for your Medicare Wellness Visit. I appreciate your ongoing commitment to your health goals. Please review the following plan we discussed and let me know if I can assist you in the future.   Screening recommendations/referrals: Colonoscopy: Done 02/18/2016 - Repeat in 5 years  Recommended yearly ophthalmology/optometry visit for glaucoma screening and checkup Recommended yearly dental visit for hygiene and checkup  Vaccinations: Influenza vaccine: Done 12/01/2019 - Repeat annually  Pneumococcal vaccine: Due (2 vaccines one year apart) Tdap vaccine: Done 10/10/2013 - Repeat in 10 years  Shingles vaccine: Due (2 doses 2-6 months apart, over 90% effective) -Shingrix discussed. Please contact your pharmacy for coverage information.     Covid-19: Done 05/26/19, 06/24/19, & 01/12/20  Advanced directives: Advance directive discussed with you today. Even though you declined this today, please call our office should you change your mind, and we can give you the proper paperwork for you to fill out.   Conditions/risks identified: Aim for 30 minutes of exercise or brisk walking each day, drink 6-8 glasses of water and eat lots of fruits and vegetables.   Next appointment: Follow up in one year for your annual wellness visit   Preventive Care 40-64 Years, Male Preventive care refers to lifestyle choices and visits with your health care provider that can promote health and wellness. What does preventive care include? A yearly physical exam. This is also called an annual well check. Dental exams once or twice a year. Routine eye exams. Ask your health care provider how often you should have your eyes checked. Personal lifestyle choices, including: Daily care of your teeth and gums. Regular physical activity. Eating a healthy diet. Avoiding tobacco and drug use. Limiting alcohol use. Practicing safe sex. Taking low-dose aspirin every day starting at age  63. What happens during an annual well check? The services and screenings done by your health care provider during your annual well check will depend on your age, overall health, lifestyle risk factors, and family history of disease. Counseling  Your health care provider may ask you questions about your: Alcohol use. Tobacco use. Drug use. Emotional well-being. Home and relationship well-being. Sexual activity. Eating habits. Work and work Astronomer. Screening  You may have the following tests or measurements: Height, weight, and BMI. Blood pressure. Lipid and cholesterol levels. These may be checked every 5 years, or more frequently if you are over 27 years old. Skin check. Lung cancer screening. You may have this screening every year starting at age 23 if you have a 30-pack-year history of smoking and currently smoke or have quit within the past 15 years. Fecal occult blood test (FOBT) of the stool. You may have this test every year starting at age 11. Flexible sigmoidoscopy or colonoscopy. You may have a sigmoidoscopy every 5 years or a colonoscopy every 10 years starting at age 36. Prostate cancer screening. Recommendations will vary depending on your family history and other risks. Hepatitis C blood test. Hepatitis B blood test. Sexually transmitted disease (STD) testing. Diabetes screening. This is done by checking your blood sugar (glucose) after you have not eaten for a while (fasting). You may have this done every 1-3 years. Discuss your test results, treatment options, and if necessary, the need for more tests with your health care provider. Vaccines  Your health care provider may recommend certain vaccines, such as: Influenza vaccine. This is recommended every year. Tetanus, diphtheria, and acellular pertussis (Tdap, Td) vaccine. You may need  a Td booster every 10 years. Zoster vaccine. You may need this after age 66. Pneumococcal 13-valent conjugate (PCV13) vaccine. You  may need this if you have certain conditions and have not been vaccinated. Pneumococcal polysaccharide (PPSV23) vaccine. You may need one or two doses if you smoke cigarettes or if you have certain conditions. Talk to your health care provider about which screenings and vaccines you need and how often you need them. This information is not intended to replace advice given to you by your health care provider. Make sure you discuss any questions you have with your health care provider. Document Released: 03/23/2015 Document Revised: 11/14/2015 Document Reviewed: 12/26/2014 Elsevier Interactive Patient Education  2017 [employer].  Fall Prevention in the Home Falls can cause injuries. They can happen to people of all ages. There are many things you can do to make your home safe and to help prevent falls. What can I do on the outside of my home? Regularly fix the edges of walkways and driveways and fix any cracks. Remove anything that might make you trip as you walk through a door, such as a raised step or threshold. Trim any bushes or trees on the path to your home. Use bright outdoor lighting. Clear any walking paths of anything that might make someone trip, such as rocks or tools. Regularly check to see if handrails are loose or broken. Make sure that both sides of any steps have handrails. Any raised decks and porches should have guardrails on the edges. Have any leaves, snow, or ice cleared regularly. Use sand or salt on walking paths during winter. Clean up any spills in your garage right away. This includes oil or grease spills. What can I do in the bathroom? Use night lights. Install grab bars by the toilet and in the tub and shower. Do not use towel bars as grab bars. Use non-skid mats or decals in the tub or shower. If you need to sit down in the shower, use a plastic, non-slip stool. Keep the floor dry. Clean up any water that spills on the floor as soon as it happens. Remove soap  buildup in the tub or shower regularly. Attach bath mats securely with double-sided non-slip rug tape. Do not have throw rugs and other things on the floor that can make you trip. What can I do in the bedroom? Use night lights. Make sure that you have a light by your bed that is easy to reach. Do not use any sheets or blankets that are too big for your bed. They should not hang down onto the floor. Have a firm chair that has side arms. You can use this for support while you get dressed. Do not have throw rugs and other things on the floor that can make you trip. What can I do in the kitchen? Clean up any spills right away. Avoid walking on wet floors. Keep items that you use a lot in easy-to-reach places. If you need to reach something above you, use a strong step stool that has a grab bar. Keep electrical cords out of the way. Do not use floor polish or wax that makes floors slippery. If you must use wax, use non-skid floor wax. Do not have throw rugs and other things on the floor that can make you trip. What can I do with my stairs? Do not leave any items on the stairs. Make sure that there are handrails on both sides of the stairs and use them. Fix  handrails that are broken or loose. Make sure that handrails are as long as the stairways. Check any carpeting to make sure that it is firmly attached to the stairs. Fix any carpet that is loose or worn. Avoid having throw rugs at the top or bottom of the stairs. If you do have throw rugs, attach them to the floor with carpet tape. Make sure that you have a light switch at the top of the stairs and the bottom of the stairs. If you do not have them, ask someone to add them for you. What else can I do to help prevent falls? Wear shoes that: Do not have high heels. Have rubber bottoms. Are comfortable and fit you well. Are closed at the toe. Do not wear sandals. If you use a stepladder: Make sure that it is fully opened. Do not climb a closed  stepladder. Make sure that both sides of the stepladder are locked into place. Ask someone to hold it for you, if possible. Clearly mark and make sure that you can see: Any grab bars or handrails. First and last steps. Where the edge of each step is. Use tools that help you move around (mobility aids) if they are needed. These include: Canes. Walkers. Scooters. Crutches. Turn on the lights when you go into a dark area. Replace any light bulbs as soon as they burn out. Set up your furniture so you have a clear path. Avoid moving your furniture around. If any of your floors are uneven, fix them. If there are any pets around you, be aware of where they are. Review your medicines with your doctor. Some medicines can make you feel dizzy. This can increase your chance of falling. Ask your doctor what other things that you can do to help prevent falls. This information is not intended to replace advice given to you by your health care provider. Make sure you discuss any questions you have with your health care provider. Document Released: 12/21/2008 Document Revised: 08/02/2015 Document Reviewed: 03/31/2014 Elsevier Interactive Patient Education  2017 [employer].

## 2020-09-18 NOTE — Progress Notes (Signed)
Subjective:   CONNOR MEACHAM is a 60 y.o. male who presents for Medicare Annual/Subsequent preventive examination.  Virtual Visit via Telephone Note  I connected with  [name] on 09/18/20 at  8:15 AM EDT by telephone and verified that I am speaking with the correct person using two identifiers.  Location: Patient: Home Provider: [facility] Persons participating in the virtual visit: patient/Nurse Health Advisor   I discussed the limitations, risks, security and privacy concerns of performing an evaluation and management service by telephone and the availability of in person appointments. The patient expressed understanding and agreed to proceed.  Interactive audio and video telecommunications were attempted between this nurse and patient, however failed, due to patient having technical difficulties OR patient did not have access to video capability.  We continued and completed visit with audio only.  Some vital signs may be absent or patient reported.   Ricahrd Schwager E Tibor Lemmons, LPN   Review of Systems     Cardiac Risk Factors include: advanced age (>79men, >38 women);obesity (BMI >30kg/m2);male gender;family history of premature cardiovascular disease;Other (see comment);dyslipidemia;hypertension, Risk factor comments: hx of TIA     Objective:    Today's Vitals   09/18/20 0821  Weight: 200 lb (90.7 kg)  Height: 5\' 5"  (1.651 m)   Body mass index is 33.28 kg/m.  Advanced Directives 09/18/2020 08/30/2019 02/18/2016 04/20/2015 11/11/2013 02/08/2013 02/01/2013  Does Patient Have a Medical Advance Directive? No Yes;No No No No Patient does not have advance directive;Patient would not like information Patient would not like information  Does patient want to make changes to medical advance directive? - No - Patient declined - - - - -  Would patient like information on creating a medical advance directive? No - Patient declined No - Patient declined - No - patient declined information No -  patient declined information - -  Pre-existing out of facility DNR order (yellow form or pink MOST form) - - - - - No No    Current Medications (verified) Outpatient Encounter Medications as of 09/18/2020  Medication Sig   allopurinol (ZYLOPRIM) 300 MG tablet Take 1 tablet (300 mg total) by mouth daily.   amLODipine (NORVASC) 10 MG tablet Take 1 tablet (10 mg total) by mouth daily.   atorvastatin (LIPITOR) 80 MG tablet One tablet everyday at 6pm   carvedilol (COREG) 6.25 MG tablet Take 1 tablet (6.25 mg total) by mouth 2 (two) times daily with a meal.   clopidogrel (PLAVIX) 75 MG tablet Take 1 tablet (75 mg total) by mouth daily.   colchicine 0.6 MG tablet Take 1 tablet (0.6 mg total) by mouth daily.   sertraline (ZOLOFT) 50 MG tablet Take 1 tablet (50 mg total) by mouth daily.   triamcinolone cream (KENALOG) 0.1 % Apply 1 application topically 2 (two) times daily.   No facility-administered encounter medications on file as of 09/18/2020.    Allergies (verified) Patient has no known allergies.   History: Past Medical History:  Diagnosis Date   Gout    Hyperlipidemia    Hypertension    Stroke (HCC) 2014   Past Surgical History:  Procedure Laterality Date   NO PAST SURGERIES     Family History  Problem Relation Age of Onset   COPD Mother    COPD Father    Cancer Sister    Colon cancer Sister        late 46's   Hyperlipidemia Brother    Diabetes Brother    Social History  Socioeconomic History   Marital status: Divorced    Spouse name: Not on file   Number of children: 1   Years of education: Not on file   Highest education level: Not on file  Occupational History   Not on file  Tobacco Use   Smoking status: Never   Smokeless tobacco: Never  Substance and Sexual Activity   Alcohol use: No   Drug use: No   Sexual activity: Not on file  Other Topics Concern   Not on file  Social History Narrative   One son lives at home with him. Other son lives in [place]    Social Determinants of Health   Financial Resource Strain: Low Risk    Difficulty of Paying Living Expenses: Not hard at all  Food Insecurity: No Food Insecurity   Worried About Programme researcher, broadcasting/film/video in the Last Year: Never true   Barista in the Last Year: Never true  Transportation Needs: No Transportation Needs   Lack of Transportation (Medical): No   Lack of Transportation (Non-Medical): No  Physical Activity: Sufficiently Active   Days of Exercise per Week: 7 days   Minutes of Exercise per Session: 30 min  Stress: No Stress Concern Present   Feeling of Stress : Not at all  Social Connections: Moderately Integrated   Frequency of Communication with Friends and Family: More than three times a week   Frequency of Social Gatherings with Friends and Family: More than three times a week   Attends Religious Services: More than 4 times per year   Active Member of [employer] or Organizations: Yes   Attends Engineer, structural: More than 4 times per year   Marital Status: Divorced    Tobacco Counseling Counseling given: Not Answered   Clinical Intake:  Pre-visit preparation completed: Yes  Pain : No/denies pain     BMI - recorded: 33.28 Nutritional Status: BMI > 30  Obese Nutritional Risks: None Diabetes: No  How often do you need to have someone help you when you read instructions, pamphlets, or other written materials from your doctor or pharmacy?: 1 - Never  Diabetic? No  Interpreter Needed?: No  Information entered by :: Leesa Leifheit, LPN   Activities of Daily Living In your present state of health, do you have any difficulty performing the following activities: 09/18/2020  Hearing? N  Vision? N  Difficulty concentrating or making decisions? N  Walking or climbing stairs? N  Comment as long as he has a handrail  Dressing or bathing? N  Doing errands, shopping? N  Preparing Food and eating ? N  Using the Toilet? N  In the past six months, have  you accidently leaked urine? N  Do you have problems with loss of bowel control? N  Managing your Medications? N  Managing your Finances? N  Housekeeping or managing your Housekeeping? N  Some recent data might be hidden    Patient Care Team: Dettinger, Elige Radon, MD as PCP - General (Family Medicine)  Indicate any recent Medical Services you may have received from other than [facility] providers in the past year (date may be approximate).     Assessment:   This is a routine wellness examination for Omeed.  Hearing/Vision screen Hearing Screening - Comments:: Denies hearing difficulties  Vision Screening - Comments:: Has glasses, but doesn't wear them - behind on annual eye exams - no eye doctor  Dietary issues and exercise activities discussed: Current Exercise Habits: Home exercise  routine, Type of exercise: walking, Time (Minutes): 30, Frequency (Times/Week): 7, Weekly Exercise (Minutes/Week): 210, Intensity: Mild, Exercise limited by: neurologic condition(s)   Goals Addressed             This Visit's Progress    DIET - INCREASE WATER INTAKE       Aim for 6-8 glasses per day        Depression Screen PHQ 2/9 Scores 09/18/2020 07/30/2020 01/30/2020 01/02/2020 08/30/2019 07/29/2019 01/28/2019  PHQ - 2 Score 0 0 0 0 0 2 0  PHQ- 9 Score - - - - - 2 -    Fall Risk Fall Risk  09/18/2020 07/30/2020 01/30/2020 01/02/2020 07/29/2019  Falls in the past year? 0 0 0 0 0  Number falls in past yr: 0 - - - -  Injury with Fall? 0 - - - -  Comment - - - - -  Risk for fall due to : Impaired balance/gait;Other (Comment) - - - -  Risk for fall due to: Comment hx of TIA, spastic hemiplegia - - - -  Follow up Falls prevention discussed;Education provided - - - -    FALL RISK PREVENTION PERTAINING TO THE HOME:  Any stairs in or around the home? Yes  If so, are there any without handrails? No  Home free of loose throw rugs in walkways, pet beds, electrical cords, etc? Yes  Adequate lighting  in your home to reduce risk of falls? Yes   ASSISTIVE DEVICES UTILIZED TO PREVENT FALLS:  Life alert? No  Use of a cane, walker or w/c? Yes  Grab bars in the bathroom? No  Shower chair or bench in shower? No  Elevated toilet seat or a handicapped toilet? No   TIMED UP AND GO:  Was the test performed? No . Telephonic visit.  Cognitive Function: Normal cognitive status assessed by direct observation by this Nurse Health Advisor. No abnormalities found.      6CIT Screen 08/30/2019  What Year? 0 points  What month? 0 points  What time? 0 points  Count back from 20 0 points  Months in reverse 0 points  Repeat phrase 0 points  Total Score 0    Immunizations Immunization History  Administered Date(s) Administered   Influenza,inj,Quad PF,6+ Mos 02/02/2013, 01/11/2014, 12/22/2014, 12/31/2015, 12/22/2016, 12/23/2017, 11/30/2018, 12/01/2019   Moderna Sars-Covid-2 Vaccination 05/26/2019, 06/24/2019, 01/12/2020   Tdap 10/10/2013    TDAP status: Up to date  Flu Vaccine status: Up to date  Pneumococcal vaccine status: Due, Education has been provided regarding the importance of this vaccine. Advised may receive this vaccine at local pharmacy or Health Dept. Aware to provide a copy of the vaccination record if obtained from local pharmacy or Health Dept. Verbalized acceptance and understanding.  Covid-19 vaccine status: Completed vaccines  Qualifies for Shingles Vaccine? Yes   Zostavax completed No   Shingrix Completed?: No.    Education has been provided regarding the importance of this vaccine. Patient has been advised to call insurance company to determine out of pocket expense if they have not yet received this vaccine. Advised may also receive vaccine at local pharmacy or Health Dept. Verbalized acceptance and understanding.  Screening Tests Health Maintenance  Topic Date Due   Zoster Vaccines- Shingrix (1 of 2) Never done   COVID-19 Vaccine (4 - Booster for Moderna series)  05/11/2020   INFLUENZA VACCINE  10/08/2020   COLONOSCOPY (Pts [id number] Insurance coverage will need to be confirmed)  02/17/2021   TETANUS/TDAP  10/11/2023  Hepatitis C Screening  Completed   Pneumococcal Vaccine 44-21 Years old  Aged Out   HPV VACCINES  Aged Out   HIV Screening  Discontinued    Health Maintenance  Health Maintenance Due  Topic Date Due   Zoster Vaccines- Shingrix (1 of 2) Never done   COVID-19 Vaccine (4 - Booster for Moderna series) 05/11/2020    Colorectal cancer screening: Type of screening: Colonoscopy. Completed 02/18/2016. Repeat every 5 years  Lung Cancer Screening: (Low Dose CT Chest recommended if Age 37-80 years, 30 pack-year currently smoking OR have quit w/in 15years.) does not qualify.   Additional Screening:  Hepatitis C Screening: does qualify; Completed 01/12/2015  Vision Screening: Recommended annual ophthalmology exams for early detection of glaucoma and other disorders of the eye. Is the patient up to date with their annual eye exam?  No  Who is the provider or what is the name of the office in which the patient attends annual eye exams? none If pt is not established with a provider, would they like to be referred to a provider to establish care? No .   Dental Screening: Recommended annual dental exams for proper oral hygiene  Community Resource Referral / Chronic Care Management: CRR required this visit?  No   CCM required this visit?  No      Plan:     I have personally reviewed and noted the following in the patient's chart:   Medical and social history Use of alcohol, tobacco or illicit drugs  Current medications and supplements including opioid prescriptions. Patient is not currently taking opioid prescriptions. Functional ability and status Nutritional status Physical activity Advanced directives List of other physicians Hospitalizations, surgeries, and ER visits in previous 12 months Vitals Screenings to include  cognitive, depression, and falls Referrals and appointments  In addition, I have reviewed and discussed with patient certain preventive protocols, quality metrics, and best practice recommendations. A written personalized care plan for preventive services as well as general preventive health recommendations were provided to patient.     [place] Constable, LPN   09/06/5282   Nurse Notes: None

## 2020-10-04 DIAGNOSIS — Z23 Encounter for immunization: Secondary | ICD-10-CM | POA: Diagnosis not present

## 2020-10-16 ENCOUNTER — Other Ambulatory Visit: Payer: Self-pay

## 2020-10-16 ENCOUNTER — Ambulatory Visit (INDEPENDENT_AMBULATORY_CARE_PROVIDER_SITE_OTHER): Payer: Medicare Other | Admitting: *Deleted

## 2020-10-16 DIAGNOSIS — Z23 Encounter for immunization: Secondary | ICD-10-CM | POA: Diagnosis not present

## 2020-10-16 NOTE — Progress Notes (Signed)
Prevnar given and patient tolerated well.  

## 2020-11-01 ENCOUNTER — Ambulatory Visit (INDEPENDENT_AMBULATORY_CARE_PROVIDER_SITE_OTHER): Payer: Medicare Other | Admitting: *Deleted

## 2020-11-01 ENCOUNTER — Other Ambulatory Visit: Payer: Self-pay

## 2020-11-01 DIAGNOSIS — Z23 Encounter for immunization: Secondary | ICD-10-CM

## 2020-12-04 ENCOUNTER — Ambulatory Visit (INDEPENDENT_AMBULATORY_CARE_PROVIDER_SITE_OTHER): Payer: Medicare Other

## 2020-12-04 ENCOUNTER — Other Ambulatory Visit: Payer: Self-pay

## 2020-12-04 DIAGNOSIS — Z23 Encounter for immunization: Secondary | ICD-10-CM

## 2020-12-24 DIAGNOSIS — L570 Actinic keratosis: Secondary | ICD-10-CM | POA: Diagnosis not present

## 2020-12-27 DIAGNOSIS — Z23 Encounter for immunization: Secondary | ICD-10-CM | POA: Diagnosis not present

## 2021-01-01 ENCOUNTER — Other Ambulatory Visit: Payer: Self-pay

## 2021-01-01 ENCOUNTER — Encounter: Payer: Self-pay | Admitting: Family Medicine

## 2021-01-01 ENCOUNTER — Ambulatory Visit (INDEPENDENT_AMBULATORY_CARE_PROVIDER_SITE_OTHER): Payer: Medicare Other | Admitting: Family Medicine

## 2021-01-01 VITALS — BP 150/71 | HR 53 | Temp 97.6°F | Ht 65.0 in | Wt 208.0 lb

## 2021-01-01 DIAGNOSIS — B351 Tinea unguium: Secondary | ICD-10-CM | POA: Diagnosis not present

## 2021-01-01 DIAGNOSIS — L03012 Cellulitis of left finger: Secondary | ICD-10-CM

## 2021-01-01 MED ORDER — SULFAMETHOXAZOLE-TRIMETHOPRIM 800-160 MG PO TABS: 1.0000 | ORAL_TABLET | Freq: Two times a day (BID) | ORAL | 0 refills | Status: AC

## 2021-01-01 MED ORDER — TERBINAFINE HCL 250 MG PO TABS: 250.0000 mg | ORAL_TABLET | Freq: Every day | ORAL | 0 refills | Status: DC

## 2021-01-01 NOTE — Progress Notes (Signed)
Subjective:  Patient ID: Bruce Guerrero, male    DOB: 1960/08/06, 60 y.o.   MRN: [id number]  Patient Care Team: Dettinger, Elige Radon, MD as PCP - General (Family Medicine)   Chief Complaint:  finger infection (x1 month)   HPI: Bruce Guerrero is a 60 y.o. male presenting on 01/01/2021 for finger infection (x1 month)   Pt presents today for evaluation of infection of left index finger. Pt states he does pick at his fingernails due to fungal infection. States the redness and swelling to his left index finger started about 1 month ago. States he has been washing with soap and water and using peroxide. He denies fever, chills, or loss of function. He states the fungal nail infection has been present for over 6 months. He has never been treated for this before.    Relevant past medical, surgical, family, and social history reviewed and updated as indicated.  Allergies and medications reviewed and updated. Data reviewed: Chart in Epic.   Past Medical History:  Diagnosis Date   Gout    Hyperlipidemia    Hypertension    Stroke (HCC) 2014    Past Surgical History:  Procedure Laterality Date   NO PAST SURGERIES      Social History   Socioeconomic History   Marital status: Divorced    Spouse name: Not on file   Number of children: 1   Years of education: Not on file   Highest education level: Not on file  Occupational History   Not on file  Tobacco Use   Smoking status: Never   Smokeless tobacco: Never  Substance and Sexual Activity   Alcohol use: No   Drug use: No   Sexual activity: Not on file  Other Topics Concern   Not on file  Social History Narrative   One son lives at home with him. Other son lives in [place]   Social Determinants of Health   Financial Resource Strain: Low Risk    Difficulty of Paying Living Expenses: Not hard at all  Food Insecurity: No Food Insecurity   Worried About Programme researcher, broadcasting/film/video in the Last Year: Never true   Engineer, site in the Last Year: Never true  Transportation Needs: No Transportation Needs   Lack of Transportation (Medical): No   Lack of Transportation (Non-Medical): No  Physical Activity: Sufficiently Active   Days of Exercise per Week: 7 days   Minutes of Exercise per Session: 30 min  Stress: No Stress Concern Present   Feeling of Stress : Not at all  Social Connections: Moderately Integrated   Frequency of Communication with Friends and Family: More than three times a week   Frequency of Social Gatherings with Friends and Family: More than three times a week   Attends Religious Services: More than 4 times per year   Active Member of [employer] or Organizations: Yes   Attends Engineer, structural: More than 4 times per year   Marital Status: Divorced  Catering manager Violence: Not At Risk   Fear of Current or Ex-Partner: No   Emotionally Abused: No   Physically Abused: No   Sexually Abused: No    Outpatient Encounter Medications as of 01/01/2021  Medication Sig   sulfamethoxazole-trimethoprim (BACTRIM DS) 800-160 MG tablet Take 1 tablet by mouth 2 (two) times daily for 7 days.   terbinafine (LAMISIL) 250 MG tablet Take 1 tablet (250 mg total) by mouth daily.   allopurinol (  ZYLOPRIM) 300 MG tablet Take 1 tablet (300 mg total) by mouth daily.   amLODipine (NORVASC) 10 MG tablet Take 1 tablet (10 mg total) by mouth daily.   atorvastatin (LIPITOR) 80 MG tablet One tablet everyday at 6pm   carvedilol (COREG) 6.25 MG tablet Take 1 tablet (6.25 mg total) by mouth 2 (two) times daily with a meal.   clopidogrel (PLAVIX) 75 MG tablet Take 1 tablet (75 mg total) by mouth daily.   colchicine 0.6 MG tablet Take 1 tablet (0.6 mg total) by mouth daily.   sertraline (ZOLOFT) 50 MG tablet Take 1 tablet (50 mg total) by mouth daily.   [DISCONTINUED] triamcinolone cream (KENALOG) 0.1 % Apply 1 application topically 2 (two) times daily.   No facility-administered encounter medications on file as of  01/01/2021.    No Known Allergies  Review of Systems  Constitutional:  Negative for activity change, appetite change, chills, diaphoresis, fatigue, fever and unexpected weight change.  Genitourinary:  Negative for decreased urine volume.  Musculoskeletal:  Positive for gait problem.  Skin:  Positive for color change and wound. Negative for pallor and rash.       Nail changes  Neurological:  Positive for weakness (right sided from prior CVA).  Psychiatric/Behavioral:  Negative for confusion.   All other systems reviewed and are negative.      Objective:  BP (!) 150/71   Pulse (!) 53   Temp 97.6 F (36.4 C)   Ht $R'5\' 5"'SK$  (1.651 m)   Wt 208 lb (94.3 kg)   SpO2 97%   BMI 34.61 kg/m    Wt Readings from Last 3 Encounters:  01/01/21 208 lb (94.3 kg)  09/18/20 200 lb (90.7 kg)  07/30/20 206 lb (93.4 kg)    Physical Exam Vitals and nursing note reviewed.  Constitutional:      General: He is not in acute distress.    Appearance: Normal appearance. He is obese. He is not ill-appearing, toxic-appearing or diaphoretic.  HENT:     Head: Normocephalic and atraumatic.     Nose: Nose normal.     Mouth/Throat:     Mouth: Mucous membranes are moist.     Pharynx: Oropharynx is clear.  Eyes:     Conjunctiva/sclera: Conjunctivae normal.     Pupils: Pupils are equal, round, and reactive to light.  Cardiovascular:     Rate and Rhythm: Normal rate and regular rhythm.     Heart sounds: Normal heart sounds. No murmur heard.   No friction rub. No gallop.  Pulmonary:     Effort: Pulmonary effort is normal.     Breath sounds: Normal breath sounds.  Skin:    General: Skin is warm and dry.     Capillary Refill: Capillary refill takes less than 2 seconds.     Findings: Erythema and wound present.     Comments: Fungal infection of fingernails, left index finger and all but right middle finger. Paronychia of left index finger.   Neurological:     Mental Status: He is alert and oriented to  person, place, and time. Mental status is at baseline.     Motor: Weakness (right sided) present.     Gait: Gait abnormal.  Psychiatric:        Mood and Affect: Mood normal.        Behavior: Behavior normal.        Thought Content: Thought content normal.        Judgment: Judgment normal.  Results for orders placed or performed in visit on 07/30/20  CBC with Differential/Platelet  Result Value Ref Range   WBC 5.5 3.4 - 10.8 x10E3/uL   RBC 4.75 4.14 - 5.80 x10E6/uL   Hemoglobin 15.1 13.0 - 17.7 g/dL   Hematocrit 44.4 37.5 - 51.0 %   MCV 94 79 - 97 fL   MCH 31.8 26.6 - 33.0 pg   MCHC 34.0 31.5 - 35.7 g/dL   RDW 13.1 11.6 - 15.4 %   Platelets 174 150 - 450 x10E3/uL   Neutrophils 61 Not Estab. %   Lymphs 30 Not Estab. %   Monocytes 7 Not Estab. %   Eos 1 Not Estab. %   Basos 1 Not Estab. %   Neutrophils Absolute 3.3 1.4 - 7.0 x10E3/uL   Lymphocytes Absolute 1.7 0.7 - 3.1 x10E3/uL   Monocytes Absolute 0.4 0.1 - 0.9 x10E3/uL   EOS (ABSOLUTE) 0.1 0.0 - 0.4 x10E3/uL   Basophils Absolute 0.0 0.0 - 0.2 x10E3/uL   Immature Granulocytes 0 Not Estab. %   Immature Grans (Abs) 0.0 0.0 - 0.1 x10E3/uL  CMP14+EGFR  Result Value Ref Range   Glucose 164 (H) 65 - 99 mg/dL   BUN 18 6 - 24 mg/dL   Creatinine, Ser 1.26 0.76 - 1.27 mg/dL   eGFR 66 >59 mL/min/1.73   BUN/Creatinine Ratio 14 9 - 20   Sodium 143 134 - 144 mmol/L   Potassium 4.2 3.5 - 5.2 mmol/L   Chloride 106 96 - 106 mmol/L   CO2 22 20 - 29 mmol/L   Calcium 9.6 8.7 - 10.2 mg/dL   Total Protein 6.5 6.0 - 8.5 g/dL   Albumin 4.6 3.8 - 4.9 g/dL   Globulin, Total 1.9 1.5 - 4.5 g/dL   Albumin/Globulin Ratio 2.4 (H) 1.2 - 2.2   Bilirubin Total 0.5 0.0 - 1.2 mg/dL   Alkaline Phosphatase 126 (H) 44 - 121 IU/L   AST 18 0 - 40 IU/L   ALT 29 0 - 44 IU/L  Lipid panel  Result Value Ref Range   Cholesterol, Total 102 100 - 199 mg/dL   Triglycerides 98 0 - 149 mg/dL   HDL 33 (L) >39 mg/dL   VLDL Cholesterol Cal 19 5 - 40 mg/dL    LDL Chol Calc (NIH) 50 0 - 99 mg/dL   Chol/HDL Ratio 3.1 0.0 - 5.0 ratio  Bayer DCA Hb A1c Waived  Result Value Ref Range   HB A1C (BAYER DCA - WAIVED) 6.2 <7.0 %       Pertinent labs & imaging results that were available during my care of the patient were reviewed by me and considered in my medical decision making.  Assessment & Plan:  Keelon was seen today for finger infection.  Diagnoses and all orders for this visit:  Paronychia of left index finger Ongoing for over a month. Will treat with oral antibiotics. Symptomatic care discussed in detail. Will treat underlying fungal nail infection to prevent recurrent infection.  -     sulfamethoxazole-trimethoprim (BACTRIM DS) 800-160 MG tablet; Take 1 tablet by mouth 2 (two) times daily for 7 days. -     CBC with Differential/Platelet  Onychomycosis Ongoing for over 6 months. Will check hepatic function and treat with Lamisil. Follow up in 6-8 weeks for reevaluation and repeat CMP.  -     terbinafine (LAMISIL) 250 MG tablet; Take 1 tablet (250 mg total) by mouth daily. -     CMP14+EGFR -  CBC with Differential/Platelet     Continue all other maintenance medications.  Follow up plan: Return in about 8 weeks (around 02/26/2021), or if symptoms worsen or fail to improve.   Continue healthy lifestyle choices, including diet (rich in fruits, vegetables, and lean proteins, and low in salt and simple carbohydrates) and exercise (at least 30 minutes of moderate physical activity daily).  Educational handout given for paronychia, fungal nail infection  The above assessment and management plan was discussed with the patient. The patient verbalized understanding of and has agreed to the management plan. Patient is aware to call the clinic if they develop any new symptoms or if symptoms persist or worsen. Patient is aware when to return to the clinic for a follow-up visit. Patient educated on when it is appropriate to go to the emergency  department.   Monia Pouch, FNP-C [facility] [phone]

## 2021-01-02 LAB — CMP14+EGFR
ALT: 31 IU/L (ref 0–44)
AST: 19 IU/L (ref 0–40)
Albumin/Globulin Ratio: 2 (ref 1.2–2.2)
Albumin: 4.5 g/dL (ref 3.8–4.9)
Alkaline Phosphatase: 107 IU/L (ref 44–121)
BUN/Creatinine Ratio: 13 (ref 9–20)
BUN: 17 mg/dL (ref 6–24)
Bilirubin Total: 0.8 mg/dL (ref 0.0–1.2)
CO2: 25 mmol/L (ref 20–29)
Calcium: 9.6 mg/dL (ref 8.7–10.2)
Chloride: 102 mmol/L (ref 96–106)
Creatinine, Ser: 1.36 mg/dL — ABNORMAL HIGH (ref 0.76–1.27)
Globulin, Total: 2.2 g/dL (ref 1.5–4.5)
Glucose: 168 mg/dL — ABNORMAL HIGH (ref 70–99)
Potassium: 4.2 mmol/L (ref 3.5–5.2)
Sodium: 141 mmol/L (ref 134–144)
Total Protein: 6.7 g/dL (ref 6.0–8.5)
eGFR: 60 mL/min/{1.73_m2} (ref >59)

## 2021-01-02 LAB — CBC WITH DIFFERENTIAL/PLATELET
Basophils Absolute: 0 10*3/uL (ref 0.0–0.2)
Basos: 1 %
EOS (ABSOLUTE): 0.1 10*3/uL (ref 0.0–0.4)
Eos: 2 %
Hematocrit: 46.5 % (ref 37.5–51.0)
Hemoglobin: 16 g/dL (ref 13.0–17.7)
Immature Grans (Abs): 0 10*3/uL (ref 0.0–0.1)
Immature Granulocytes: 0 %
Lymphocytes Absolute: 1.8 10*3/uL (ref 0.7–3.1)
Lymphs: 36 %
MCH: 32.7 pg (ref 26.6–33.0)
MCHC: 34.4 g/dL (ref 31.5–35.7)
MCV: 95 fL (ref 79–97)
Monocytes Absolute: 0.4 10*3/uL (ref 0.1–0.9)
Monocytes: 8 %
Neutrophils Absolute: 2.7 10*3/uL (ref 1.4–7.0)
Neutrophils: 53 %
Platelets: 159 10*3/uL (ref 150–450)
RBC: 4.89 x10E6/uL (ref 4.14–5.80)
RDW: 13.2 % (ref 11.6–15.4)
WBC: 5 10*3/uL (ref 3.4–10.8)

## 2021-01-30 ENCOUNTER — Encounter: Payer: Self-pay | Admitting: Family Medicine

## 2021-01-30 ENCOUNTER — Ambulatory Visit (INDEPENDENT_AMBULATORY_CARE_PROVIDER_SITE_OTHER): Payer: Medicare Other | Admitting: Family Medicine

## 2021-01-30 ENCOUNTER — Other Ambulatory Visit: Payer: Self-pay

## 2021-01-30 VITALS — BP 118/63 | HR 61 | Ht 65.0 in | Wt 205.0 lb

## 2021-01-30 DIAGNOSIS — Z0001 Encounter for general adult medical examination with abnormal findings: Secondary | ICD-10-CM | POA: Diagnosis not present

## 2021-01-30 DIAGNOSIS — E782 Mixed hyperlipidemia: Secondary | ICD-10-CM

## 2021-01-30 DIAGNOSIS — I10 Essential (primary) hypertension: Secondary | ICD-10-CM | POA: Diagnosis not present

## 2021-01-30 DIAGNOSIS — R7303 Prediabetes: Secondary | ICD-10-CM

## 2021-01-30 DIAGNOSIS — Z0000 Encounter for general adult medical examination without abnormal findings: Secondary | ICD-10-CM

## 2021-01-30 LAB — BAYER DCA HB A1C WAIVED: HB A1C (BAYER DCA - WAIVED): 6.4 % — ABNORMAL HIGH (ref 4.8–5.6)

## 2021-01-30 NOTE — Progress Notes (Signed)
BP 118/63   Pulse 61   Ht $R'5\' 5"'dQ$  (1.651 m)   Wt 205 lb (93 kg)   SpO2 98%   BMI 34.11 kg/m    Subjective:   Patient ID: Bruce Guerrero, male    DOB: 29-Nov-1960, 60 y.o.   MRN: [id number]  HPI: Bruce Guerrero is a 61 y.o. male presenting on 01/30/2021 for Medical Management of Chronic Issues, Hyperlipidemia, Hypertension, and Prediabetes   HPI Physical exam and recheck chronic medical issues. Patient says the only real issue is having today is on his right knee he says when he stands for long periods of time and then starts to move afterwards he feels like it will give out on him.  He says this is been bothering him more over the past couple months.  This is does not happen all the time but only when he standing for long periods. Patient denies any chest pain, shortness of breath, headaches or vision issues, abdominal complaints, diarrhea, nausea, vomiting, or other joint issues.   Prediabetes  patient comes in today for recheck of his diabetes. Patient has been currently taking no medication currently, monitoring for now, will check A1c today. Patient is not currently on an ACE inhibitor/ARB. Patient has not seen an ophthalmologist this year. Patient denies any issues with their feet. The symptom started onset as an adult hypertension and hyperlipidemia ARE RELATED TO DM   Hypertension Patient is currently on amlodipine and carvedilol, and their blood pressure today is 118/63. Patient denies any lightheadedness or dizziness. Patient denies headaches, blurred vision, chest pains, shortness of breath, or weakness. Denies any side effects from medication and is content with current medication.   Hyperlipidemia Patient is coming in for recheck of his hyperlipidemia. The patient is currently taking atorvastatin. They deny any issues with myalgias or history of liver damage from it. They deny any focal numbness or weakness or chest pain.   Relevant past medical, surgical, family and  social history reviewed and updated as indicated. Interim medical history since our last visit reviewed. Allergies and medications reviewed and updated.  Review of Systems  Constitutional:  Negative for chills and fever.  Eyes:  Negative for visual disturbance.  Respiratory:  Negative for shortness of breath and wheezing.   Cardiovascular:  Negative for chest pain and leg swelling.  Musculoskeletal:  Negative for back pain and gait problem.  Skin:  Negative for rash.  Neurological:  Negative for dizziness, weakness and numbness.  All other systems reviewed and are negative.  Per HPI unless specifically indicated above   Allergies as of 01/30/2021   No Known Allergies      Medication List        Accurate as of January 30, 2021 10:33 AM. If you have any questions, ask your nurse or doctor.          allopurinol 300 MG tablet Commonly known as: ZYLOPRIM Take 1 tablet (300 mg total) by mouth daily.   amLODipine 10 MG tablet Commonly known as: NORVASC Take 1 tablet (10 mg total) by mouth daily.   atorvastatin 80 MG tablet Commonly known as: LIPITOR One tablet everyday at 6pm   carvedilol 6.25 MG tablet Commonly known as: COREG Take 1 tablet (6.25 mg total) by mouth 2 (two) times daily with a meal.   clopidogrel 75 MG tablet Commonly known as: PLAVIX Take 1 tablet (75 mg total) by mouth daily.   colchicine 0.6 MG tablet Take 1 tablet (0.6 mg total) by  mouth daily.   sertraline 50 MG tablet Commonly known as: ZOLOFT Take 1 tablet (50 mg total) by mouth daily.   terbinafine 250 MG tablet Commonly known as: LamISIL Take 1 tablet (250 mg total) by mouth daily.         Objective:   BP 118/63   Pulse 61   Ht 5\' 5"  (1.651 m)   Wt 205 lb (93 kg)   SpO2 98%   BMI 34.11 kg/m   Wt Readings from Last 3 Encounters:  01/30/21 205 lb (93 kg)  01/01/21 208 lb (94.3 kg)  09/18/20 200 lb (90.7 kg)    Physical Exam Vitals and nursing note reviewed.   Constitutional:      General: He is not in acute distress.    Appearance: He is well-developed. He is not diaphoretic.  Eyes:     General: No scleral icterus.    Conjunctiva/sclera: Conjunctivae normal.  Neck:     Thyroid: No thyromegaly.  Cardiovascular:     Rate and Rhythm: Normal rate and regular rhythm.     Heart sounds: Normal heart sounds. No murmur heard. Pulmonary:     Effort: Pulmonary effort is normal. No respiratory distress.     Breath sounds: Normal breath sounds. No wheezing.  Abdominal:     General: Abdomen is flat. Bowel sounds are normal. There is no distension.     Tenderness: There is no abdominal tenderness. There is no guarding or rebound.  Musculoskeletal:        General: No swelling. Normal range of motion.     Cervical back: Neck supple.  Lymphadenopathy:     Cervical: No cervical adenopathy.  Skin:    General: Skin is warm and dry.     Findings: No rash.  Neurological:     Mental Status: He is alert and oriented to person, place, and time.     Coordination: Coordination normal.  Psychiatric:        Behavior: Behavior normal.    Results for orders placed or performed in visit on 01/01/21  CMP14+EGFR  Result Value Ref Range   Glucose 168 (H) 70 - 99 mg/dL   BUN 17 6 - 24 mg/dL   Creatinine, Ser 01/03/21 (H) 0.76 - 1.27 mg/dL   eGFR 60 9.14 >30   BUN/Creatinine Ratio 13 9 - 20   Sodium 141 134 - 144 mmol/L   Potassium 4.2 3.5 - 5.2 mmol/L   Chloride 102 96 - 106 mmol/L   CO2 25 20 - 29 mmol/L   Calcium 9.6 8.7 - 10.2 mg/dL   Total Protein 6.7 6.0 - 8.5 g/dL   Albumin 4.5 3.8 - 4.9 g/dL   Globulin, Total 2.2 1.5 - 4.5 g/dL   Albumin/Globulin Ratio 2.0 1.2 - 2.2   Bilirubin Total 0.8 0.0 - 1.2 mg/dL   Alkaline Phosphatase 107 44 - 121 IU/L   AST 19 0 - 40 IU/L   ALT 31 0 - 44 IU/L  CBC with Differential/Platelet  Result Value Ref Range   WBC 5.0 3.4 - 10.8 x10E3/uL   RBC 4.89 4.14 - 5.80 x10E6/uL   Hemoglobin 16.0 13.0 - 17.7 g/dL    Hematocrit CX/SCV/7.60 13.2 - 51.0 %   MCV 95 79 - 97 fL   MCH 32.7 26.6 - 33.0 pg   MCHC 34.4 31.5 - 35.7 g/dL   RDW 89.5 58.1 - 15.3 %   Platelets 159 150 - 450 x10E3/uL   Neutrophils 53 Not Estab. %  Lymphs 36 Not Estab. %   Monocytes 8 Not Estab. %   Eos 2 Not Estab. %   Basos 1 Not Estab. %   Neutrophils Absolute 2.7 1.4 - 7.0 x10E3/uL   Lymphocytes Absolute 1.8 0.7 - 3.1 x10E3/uL   Monocytes Absolute 0.4 0.1 - 0.9 x10E3/uL   EOS (ABSOLUTE) 0.1 0.0 - 0.4 x10E3/uL   Basophils Absolute 0.0 0.0 - 0.2 x10E3/uL   Immature Granulocytes 0 Not Estab. %   Immature Grans (Abs) 0.0 0.0 - 0.1 x10E3/uL    Assessment & Plan:   Problem List Items Addressed This Visit       Cardiovascular and Mediastinum   HTN (hypertension)   Relevant Orders   Lipid panel   BMP8+EGFR     Other   Hyperlipidemia   Relevant Orders   Lipid panel   BMP8+EGFR   Prediabetes   Relevant Orders   Bayer DCA Hb A1c Waived   BMP8+EGFR   Other Visit Diagnoses     Well adult exam    -  Primary       Continue current medicine, no changes, will check blood work. Follow up plan: Return in about 6 months (around 07/30/2021), or if symptoms worsen or fail to improve, for Hypertension and hyper lipid.  Counseling provided for all of the vaccine components Orders Placed This Encounter  Procedures   Lipid panel   Bayer [place] Hb A1c Waived   BMP8+EGFR    Caryl Pina, MD [facility] 01/30/2021, 10:33 AM

## 2021-01-31 LAB — BMP8+EGFR
BUN/Creatinine Ratio: 12 (ref 9–20)
BUN: 15 mg/dL (ref 6–24)
CO2: 25 mmol/L (ref 20–29)
Calcium: 9.3 mg/dL (ref 8.7–10.2)
Chloride: 101 mmol/L (ref 96–106)
Creatinine, Ser: 1.29 mg/dL — ABNORMAL HIGH (ref 0.76–1.27)
Glucose: 152 mg/dL — ABNORMAL HIGH (ref 70–99)
Potassium: 4.3 mmol/L (ref 3.5–5.2)
Sodium: 140 mmol/L (ref 134–144)
eGFR: 64 mL/min/{1.73_m2} (ref >59)

## 2021-01-31 LAB — LIPID PANEL
Chol/HDL Ratio: 3.1 ratio (ref 0.0–5.0)
Cholesterol, Total: 96 mg/dL — ABNORMAL LOW (ref 100–199)
HDL: 31 mg/dL — ABNORMAL LOW (ref >39)
LDL Chol Calc (NIH): 45 mg/dL (ref 0–99)
Triglycerides: 109 mg/dL (ref 0–149)
VLDL Cholesterol Cal: 20 mg/dL (ref 5–40)

## 2021-02-18 ENCOUNTER — Other Ambulatory Visit: Payer: Self-pay | Admitting: Family Medicine

## 2021-02-18 DIAGNOSIS — I10 Essential (primary) hypertension: Secondary | ICD-10-CM

## 2021-02-21 ENCOUNTER — Other Ambulatory Visit: Payer: Self-pay | Admitting: Family Medicine

## 2021-02-21 DIAGNOSIS — L570 Actinic keratosis: Secondary | ICD-10-CM | POA: Diagnosis not present

## 2021-02-21 DIAGNOSIS — F411 Generalized anxiety disorder: Secondary | ICD-10-CM

## 2021-02-25 ENCOUNTER — Encounter: Payer: Self-pay | Admitting: Family Medicine

## 2021-02-25 ENCOUNTER — Ambulatory Visit (INDEPENDENT_AMBULATORY_CARE_PROVIDER_SITE_OTHER): Payer: Medicare Other | Admitting: Family Medicine

## 2021-02-25 VITALS — BP 136/64 | HR 59 | Ht 65.0 in | Wt 205.0 lb

## 2021-02-25 DIAGNOSIS — B351 Tinea unguium: Secondary | ICD-10-CM | POA: Diagnosis not present

## 2021-02-25 DIAGNOSIS — L03012 Cellulitis of left finger: Secondary | ICD-10-CM

## 2021-02-25 MED ORDER — TERBINAFINE HCL 250 MG PO TABS: 250.0000 mg | ORAL_TABLET | Freq: Every day | ORAL | 0 refills | Status: DC

## 2021-02-25 NOTE — Progress Notes (Signed)
BP 136/64    Pulse (!) 59    Ht 5\' 5"  (1.651 m)    Wt 205 lb (93 kg)    SpO2 98%    BMI 34.11 kg/m    Subjective:   Patient ID: , male    DOB: [birth year], 60 y.o.   MRN: [id number]  HPI: Bruce Guerrero is a 60 y.o. male presenting on 02/25/2021 for hand swelling (Left. Treated with ATB- some improvement)   HPI Patient is coming in for recheck on his infection around his fingernails and the fungus in his nails.  He says it has been improving but not gone.  The redness and pain that was around the 1 nail is pretty much gone but fungus in his nails is improving but not gone.  He would like to continue another round of it.  Relevant past medical, surgical, family and social history reviewed and updated as indicated. Interim medical history since our last visit reviewed. Allergies and medications reviewed and updated.  Review of Systems  Constitutional:  Negative for chills and fever.  Respiratory:  Negative for shortness of breath and wheezing.   Cardiovascular:  Negative for chest pain and leg swelling.  Musculoskeletal:  Negative for back pain and gait problem.  Skin:  Positive for color change. Negative for rash.  All other systems reviewed and are negative.  Per HPI unless specifically indicated above   Allergies as of 02/25/2021   No Known Allergies      Medication List        Accurate as of February 25, 2021 11:50 AM. If you have any questions, ask your nurse or doctor.          allopurinol 300 MG tablet Commonly known as: ZYLOPRIM Take 1 tablet (300 mg total) by mouth daily.   amLODipine 10 MG tablet Commonly known as: NORVASC TAKE 1 TABLET BY MOUTH EVERY DAY   atorvastatin 80 MG tablet Commonly known as: LIPITOR One tablet everyday at 6pm   carvedilol 6.25 MG tablet Commonly known as: COREG Take 1 tablet (6.25 mg total) by mouth 2 (two) times daily with a meal.   clopidogrel 75 MG tablet Commonly known as: PLAVIX TAKE 1 TABLET  BY MOUTH EVERY DAY   colchicine 0.6 MG tablet Take 1 tablet (0.6 mg total) by mouth daily.   sertraline 50 MG tablet Commonly known as: ZOLOFT TAKE 1 TABLET BY MOUTH EVERY DAY   terbinafine 250 MG tablet Commonly known as: LamISIL Take 1 tablet (250 mg total) by mouth daily.         Objective:   BP 136/64    Pulse (!) 59    Ht 5\' 5"  (1.651 m)    Wt 205 lb (93 kg)    SpO2 98%    BMI 34.11 kg/m   Wt Readings from Last 3 Encounters:  02/25/21 205 lb (93 kg)  01/30/21 205 lb (93 kg)  01/01/21 208 lb (94.3 kg)    Physical Exam Vitals and nursing note reviewed.  Constitutional:      Appearance: Normal appearance.  Skin:    Comments: Patient has thickened and yellowed nails on all of his fingers.  No erythema noted around the nailbed.  Neurological:     Mental Status: He is alert.      Assessment & Plan:   Problem List Items Addressed This Visit   None Visit Diagnoses     Onychomycosis    -  Primary   Relevant  Medications   terbinafine (LAMISIL) 250 MG tablet   Paronychia of left index finger       Relevant Medications   terbinafine (LAMISIL) 250 MG tablet       Will refill terbinafine and do another 3 months of it and follow-up from there.  Just checked liver enzymes last month and they look good. Follow up plan: Return if symptoms worsen or fail to improve.  Counseling provided for all of the vaccine components No orders of the defined types were placed in this encounter.   Arville Care, MD [facility] 02/25/2021, 11:50 AM

## 2021-03-12 ENCOUNTER — Ambulatory Visit (INDEPENDENT_AMBULATORY_CARE_PROVIDER_SITE_OTHER): Payer: Medicare Other | Admitting: *Deleted

## 2021-03-12 DIAGNOSIS — Z23 Encounter for immunization: Secondary | ICD-10-CM

## 2021-03-12 NOTE — Progress Notes (Signed)
Patient in today for second Shingles vaccine. Given IM in left deltoid. Patient tolerated well.

## 2021-03-24 ENCOUNTER — Other Ambulatory Visit: Payer: Self-pay | Admitting: Family Medicine

## 2021-03-24 DIAGNOSIS — I10 Essential (primary) hypertension: Secondary | ICD-10-CM

## 2021-03-26 ENCOUNTER — Other Ambulatory Visit: Payer: Self-pay | Admitting: Family Medicine

## 2021-03-27 ENCOUNTER — Telehealth: Payer: Self-pay

## 2021-03-27 ENCOUNTER — Encounter: Payer: Self-pay | Admitting: Gastroenterology

## 2021-03-27 ENCOUNTER — Ambulatory Visit: Payer: Medicare Other | Admitting: Gastroenterology

## 2021-03-27 VITALS — BP 136/72 | HR 76 | Ht 64.5 in | Wt 206.0 lb

## 2021-03-27 DIAGNOSIS — Z1211 Encounter for screening for malignant neoplasm of colon: Secondary | ICD-10-CM | POA: Diagnosis not present

## 2021-03-27 DIAGNOSIS — K649 Unspecified hemorrhoids: Secondary | ICD-10-CM | POA: Diagnosis not present

## 2021-03-27 DIAGNOSIS — Z800 Family history of malignant neoplasm of digestive organs: Secondary | ICD-10-CM | POA: Diagnosis not present

## 2021-03-27 DIAGNOSIS — Z7902 Long term (current) use of antithrombotics/antiplatelets: Secondary | ICD-10-CM | POA: Diagnosis not present

## 2021-03-27 MED ORDER — PLENVU 140 G PO SOLR: 140.0000 g | ORAL | 0 refills | Status: DC

## 2021-03-27 NOTE — Telephone Encounter (Signed)
Patient is not on plavix due to cardiac issue. We have never seen this patient before. He was placed on plavix on 02/01/2013 for CVA. Will defer holding time of plavix to PCP.

## 2021-03-27 NOTE — Telephone Encounter (Signed)
Called pt and advised about holding Plavix per PCP's recommendations as documented below. Verbalized acceptance and understanding.

## 2021-03-27 NOTE — Telephone Encounter (Signed)
[facility] Pre-operative Risk Assessment     Request for surgical clearance:     Endoscopy Procedure  What type of surgery is being performed?     Colonoscopy  When is this surgery scheduled?     05-09-2021  What type of clearance is required ?   Pharmacy  Are there any medications that need to be held prior to surgery and how long? Yes, Plavix, 5 days hold request   Practice name and name of physician performing surgery?      [facility]  What is your office phone and fax number?      Phone- [phone]  [phone]  Anesthesia type (None, local, MAC, general) ?       MAC

## 2021-03-27 NOTE — Patient Instructions (Signed)
If you are age 59 or older, your body mass index should be between 23-30. Your Body mass index is 34.81 kg/m. If this is out of the aforementioned range listed, please consider follow up with your Primary Care Provider.  If you are age 51 or younger, your body mass index should be between 19-25. Your Body mass index is 34.81 kg/m. If this is out of the aformentioned range listed, please consider follow up with your Primary Care Provider.   ________________________________________________________  The [facility] providers would like to encourage you to use [facility] to communicate with providers for non-urgent requests or questions.  Due to long hold times on the telephone, sending your provider a message by [facility] may be a faster and more efficient way to get a response.  Please allow 48 business hours for a response.  Please remember that this is for non-urgent requests.  _______________________________________________________  Bruce Guerrero will be contacted by our office prior to your procedure for directions on holding your Plavix.  If you do not hear from our office 1 week prior to your scheduled procedure, please call [phone] to discuss.   _________  Bruce Guerrero have been scheduled for a colonoscopy. Please follow written instructions given to you at your visit today.  Please pick up your prep supplies at the pharmacy within the next 1-3 days. If you use inhalers (even only as needed), please bring them with you on the day of your procedure.  Due to recent changes in healthcare laws, you may see the results of your imaging and laboratory studies on MyChart before your provider has had a chance to review them.  We understand that in some cases there may be results that are confusing or concerning to you. Not all laboratory results come back in the same time frame and the provider may be waiting for multiple results in order to interpret others.  Please give [name] 48 hours in order for your provider to  thoroughly review all the results before contacting the office for clarification of your results.    It was a pleasure to see you today!  Thank you for trusting me with your gastrointestinal care!

## 2021-03-27 NOTE — Progress Notes (Signed)
[facility] Consult Note:  History: AZA LAUE 03/27/2021  Referring provider: Dettinger, Elige Radon, MD  Reason for consult/chief complaint: Colonoscopy (Due, patient denies Sx, regular BMs) and Hemorrhoids (Patient reports he BR blood in the toilet once every couple of months, only lasts a couple of days, never dark )   Subjective  HPI:  Lorin Picket is here to assess overall health and consideration of screening colonoscopy. [name] had his first colonoscopy with me in December 2017 for history of colon cancer in his sister (diagnosed in her late 31s).  Complete exam with good prep, no polyps found, 5-year recall recommended. Plavix was held 5 days prior to that procedure, at that time note sent to Dr. Jacalyn Lefevre regarding that.)  Patient has been on Plavix since a CVA in about 2014.  His health has been stable, his neurologic status stable and Plavix prescribed by primary care.  He denies chest pain dyspnea dysuria altered bowel habits or upper digestive symptoms. He has an episode of small-volume painless rectal bleeding every few months, which is occurred for years.  ROS:  Review of Systems Right hemiparesis since 2014 CVA   Past Medical History: Past Medical History:  Diagnosis Date   Gout    Hyperlipidemia    Hypertension    Stroke (HCC) 2014     Past Surgical History: Past Surgical History:  Procedure Laterality Date   NO PAST SURGERIES       Family History: Family History  Problem Relation Age of Onset   COPD Mother    COPD Father    Cancer Sister    Colon cancer Sister        late 74's   Hyperlipidemia Brother    Diabetes Brother     Social History: Social History   Socioeconomic History   Marital status: Divorced    Spouse name: Not on file   Number of children: 1   Years of education: Not on file   Highest education level: Not on file  Occupational History   Not on file  Tobacco Use   Smoking status: Never    Smokeless tobacco: Never  Vaping Use   Vaping Use: Never used  Substance and Sexual Activity   Alcohol use: No   Drug use: No   Sexual activity: Not on file  Other Topics Concern   Not on file  Social History Narrative   One son lives at home with him. Other son lives in [place]   Social Determinants of Health   Financial Resource Strain: Low Risk    Difficulty of Paying Living Expenses: Not hard at all  Food Insecurity: No Food Insecurity   Worried About Programme researcher, broadcasting/film/video in the Last Year: Never true   Barista in the Last Year: Never true  Transportation Needs: No Transportation Needs   Lack of Transportation (Medical): No   Lack of Transportation (Non-Medical): No  Physical Activity: Sufficiently Active   Days of Exercise per Week: 7 days   Minutes of Exercise per Session: 30 min  Stress: No Stress Concern Present   Feeling of Stress : Not at all  Social Connections: Moderately Integrated   Frequency of Communication with Friends and Family: More than three times a week   Frequency of Social Gatherings with Friends and Family: More than three times a week   Attends Religious Services: More than 4 times per year   Active Member of Clubs or Organizations: Yes   Attends  Music therapist: More than 4 times per year   Marital Status: Divorced    Allergies: No Known Allergies  Outpatient Meds: Current Outpatient Medications  Medication Sig Dispense Refill   allopurinol (ZYLOPRIM) 300 MG tablet TAKE 1 TABLET BY MOUTH EVERY DAY 90 tablet 1   amLODipine (NORVASC) 10 MG tablet TAKE 1 TABLET BY MOUTH EVERY DAY 90 tablet 0   atorvastatin (LIPITOR) 80 MG tablet One tablet everyday at 6pm 90 tablet 3   carvedilol (COREG) 6.25 MG tablet TAKE 1 TABLET BY MOUTH 2 TIMES DAILY WITH A MEAL. 180 tablet 1   clopidogrel (PLAVIX) 75 MG tablet TAKE 1 TABLET BY MOUTH EVERY DAY 90 tablet 0   colchicine 0.6 MG tablet TAKE 1 TABLET BY MOUTH EVERY DAY 90 tablet 1    sertraline (ZOLOFT) 50 MG tablet TAKE 1 TABLET BY MOUTH EVERY DAY 90 tablet 0   terbinafine (LAMISIL) 250 MG tablet Take 1 tablet (250 mg total) by mouth daily. 90 tablet 0   No current facility-administered medications for this visit.      ___________________________________________________________________ Objective   Exam:  BP 136/72    Pulse 76    Ht 5' 4.5" (1.638 m)    Wt 206 lb (93.4 kg)    SpO2 98%    BMI 34.81 kg/m  Wt Readings from Last 3 Encounters:  03/27/21 206 lb (93.4 kg)  02/25/21 205 lb (93 kg)  01/30/21 205 lb (93 kg)    General: Well-appearing Eyes: sclera anicteric, no redness ENT: oral mucosa moist without lesions, no cervical or supraclavicular lymphadenopathy CV: RRR without murmur, S1/S2, no JVD, bilateral venous stasis changes and pitting edema to knee Resp: clear to auscultation bilaterally, normal RR and effort noted GI: soft, no tenderness, with active bowel sounds. No guarding or palpable organomegaly noted. Neuro: Ambulatory, altered gait.  Gets on exam table slowly but without assistance.  Right hemiparesis  Labs:  CBC Latest Ref Rng & Units 01/01/2021 07/30/2020 01/30/2020  WBC 3.4 - 10.8 x10E3/uL 5.0 5.5 6.5  Hemoglobin 13.0 - 17.7 g/dL 16.0 15.1 12.1(L)  Hematocrit 37.5 - 51.0 % 46.5 44.4 36.9(L)  Platelets 150 - 450 x10E3/uL 159 174 214   CMP Latest Ref Rng & Units 01/30/2021 01/01/2021 07/30/2020  Glucose 70 - 99 mg/dL 152(H) 168(H) 164(H)  BUN 6 - 24 mg/dL 15 17 18   Creatinine 0.76 - 1.27 mg/dL 1.29(H) 1.36(H) 1.26  Sodium 134 - 144 mmol/L 140 141 143  Potassium 3.5 - 5.2 mmol/L 4.3 4.2 4.2  Chloride 96 - 106 mmol/L 101 102 106  CO2 20 - 29 mmol/L 25 25 22   Calcium 8.7 - 10.2 mg/dL 9.3 9.6 9.6  Total Protein 6.0 - 8.5 g/dL - 6.7 6.5  Total Bilirubin 0.0 - 1.2 mg/dL - 0.8 0.5  Alkaline Phos 44 - 121 IU/L - 107 126(H)  AST 0 - 40 IU/L - 19 18  ALT 0 - 44 IU/L - 31 29      Assessment: Encounter Diagnoses  Name Primary?   Family  history of colon cancer Yes   Long term (current) use of antithrombotics/antiplatelets     His medical condition is stable overall and appropriate for outpatient screening colonoscopy, increased risk due to family history of colon cancer. Hold Plavix 5 days prior, we will message PCP about this but I expect that we will be agreeable given the length of time since CVA and patient's stability.  Is occasional painless bleeding sounds hemorrhoidal, and I consider  his colonoscopy to be screening.   Thank you for the courtesy of this consult.  Please call me with any questions or concerns.  Nelida Meuse III  CC: Referring provider noted above

## 2021-04-01 ENCOUNTER — Other Ambulatory Visit: Payer: Self-pay | Admitting: Gastroenterology

## 2021-04-13 ENCOUNTER — Other Ambulatory Visit: Payer: Self-pay | Admitting: Family Medicine

## 2021-04-13 DIAGNOSIS — E785 Hyperlipidemia, unspecified: Secondary | ICD-10-CM

## 2021-04-16 ENCOUNTER — Encounter: Payer: Self-pay | Admitting: Nurse Practitioner

## 2021-04-16 ENCOUNTER — Ambulatory Visit (INDEPENDENT_AMBULATORY_CARE_PROVIDER_SITE_OTHER): Payer: Medicare Other

## 2021-04-16 ENCOUNTER — Ambulatory Visit (INDEPENDENT_AMBULATORY_CARE_PROVIDER_SITE_OTHER): Payer: Medicare Other | Admitting: Nurse Practitioner

## 2021-04-16 ENCOUNTER — Ambulatory Visit (HOSPITAL_COMMUNITY): Admission: RE | Admit: 2021-04-16 | Payer: Medicare Other | Source: Ambulatory Visit

## 2021-04-16 ENCOUNTER — Other Ambulatory Visit: Payer: Self-pay

## 2021-04-16 VITALS — BP 150/64 | HR 60 | Temp 98.5°F | Ht 65.0 in | Wt 206.0 lb

## 2021-04-16 DIAGNOSIS — R109 Unspecified abdominal pain: Secondary | ICD-10-CM | POA: Diagnosis not present

## 2021-04-16 MED ORDER — ACETAMINOPHEN 500 MG PO TABS
500.0000 mg | ORAL_TABLET | Freq: Four times a day (QID) | ORAL | 0 refills | Status: DC | PRN
Start: 2021-04-16 — End: 2021-09-18

## 2021-04-16 NOTE — Progress Notes (Signed)
Acute Office Visit  Subjective:    Patient ID: Bruce Guerrero, male    DOB: May 20, 1960, 61 y.o.   MRN: [id number]  Chief Complaint  Patient presents with   right side back/flank pain    X 1 week    HPI Patient is in today for Pain  He reports new onset right flank/right upper quadrant abdominal pain pain. was not an injury that may have caused the pain. The pain started 1 week ago and is staying constant. The pain does radiate right back/flank. The pain is described as aching, is 4/10 in intensity, occurring intermittently. Symptoms are worse in the: evening  Aggravating factors: standing and walking Relieving factors: none.  He has tried acetaminophen with no relief.     Past Medical History:  Diagnosis Date   Gout    Hyperlipidemia    Hypertension    Stroke [facility]) 2014    Past Surgical History:  Procedure Laterality Date   NO PAST SURGERIES      Family History  Problem Relation Age of Onset   COPD Mother    COPD Father    Cancer Sister    Colon cancer Sister        late 20's   Hyperlipidemia Brother    Diabetes Brother     Social History   Socioeconomic History   Marital status: Divorced    Spouse name: Not on file   Number of children: 1   Years of education: Not on file   Highest education level: Not on file  Occupational History   Not on file  Tobacco Use   Smoking status: Never   Smokeless tobacco: Never  Vaping Use   Vaping Use: Never used  Substance and Sexual Activity   Alcohol use: No   Drug use: No   Sexual activity: Not on file  Other Topics Concern   Not on file  Social History Narrative   One son lives at home with him. Other son lives in [place] Resource Strain: Low Risk    Difficulty of Paying Living Expenses: Not hard at all  Food Insecurity: No Food Insecurity   Worried About Charity fundraiser in the Last Year: Never true   Arboriculturist in the Last Year: Never true   Transportation Needs: No Transportation Needs   Lack of Transportation (Medical): No   Lack of Transportation (Non-Medical): No  Physical Activity: Sufficiently Active   Days of Exercise per Week: 7 days   Minutes of Exercise per Session: 30 min  Stress: No Stress Concern Present   Feeling of Stress : Not at all  Social Connections: Moderately Integrated   Frequency of Communication with Friends and Family: More than three times a week   Frequency of Social Gatherings with Friends and Family: More than three times a week   Attends Religious Services: More than 4 times per year   Active Member of [employer] or Organizations: Yes   Attends Music therapist: More than 4 times per year   Marital Status: Divorced  Human resources officer Violence: Not At Risk   Fear of Current or Ex-Partner: No   Emotionally Abused: No   Physically Abused: No   Sexually Abused: No    Outpatient Medications Prior to Visit  Medication Sig Dispense Refill   allopurinol (ZYLOPRIM) 300 MG tablet TAKE 1 TABLET BY MOUTH EVERY DAY 90 tablet 1   amLODipine (NORVASC) 10  MG tablet TAKE 1 TABLET BY MOUTH EVERY DAY 90 tablet 0   atorvastatin (LIPITOR) 80 MG tablet TAKE 1 TABLET BY MOUTH DAILY AT 6PM 90 tablet 1   carvedilol (COREG) 6.25 MG tablet TAKE 1 TABLET BY MOUTH 2 TIMES DAILY WITH A MEAL. 180 tablet 1   clopidogrel (PLAVIX) 75 MG tablet TAKE 1 TABLET BY MOUTH EVERY DAY 90 tablet 0   colchicine 0.6 MG tablet TAKE 1 TABLET BY MOUTH EVERY DAY 90 tablet 1   PEG-KCl-NaCl-NaSulf-Na Asc-C (PLENVU) 140 g SOLR Take 140 g by mouth as directed. Manufacturer's coupon Universal coupon code:BIN: P2366821; GROUP: [id number]; PCN: [id number]; ID: [id number]; PAY NO MORE $50 1 each 0   sertraline (ZOLOFT) 50 MG tablet TAKE 1 TABLET BY MOUTH EVERY DAY 90 tablet 0   terbinafine (LAMISIL) 250 MG tablet Take 1 tablet (250 mg total) by mouth daily. 90 tablet 0   No facility-administered medications prior to visit.    No Known  Allergies  Review of Systems  Constitutional: Negative.   HENT: Negative.    Eyes: Negative.   Respiratory: Negative.    Cardiovascular: Negative.   Gastrointestinal: Negative.   Genitourinary: Negative.   Musculoskeletal: Negative.   Skin: Negative.   All other systems reviewed and are negative.     Objective:    Physical Exam Vitals and nursing note reviewed.  Constitutional:      Appearance: Normal appearance.  HENT:     Head: Normocephalic.     Right Ear: External ear normal.     Left Ear: External ear normal.     Mouth/Throat:     Mouth: Mucous membranes are moist.     Pharynx: Oropharynx is clear.  Eyes:     Conjunctiva/sclera: Conjunctivae normal.  Cardiovascular:     Rate and Rhythm: Normal rate and regular rhythm.     Pulses: Normal pulses.     Heart sounds: Normal heart sounds.  Abdominal:     General: Bowel sounds are normal.  Musculoskeletal:        General: Tenderness present.     Comments: Right upper quadrant chest wall/abdominal pain  Skin:    General: Skin is warm.     Findings: No rash.  Neurological:     Mental Status: He is alert and oriented to person, place, and time.  Psychiatric:        Behavior: Behavior normal.    BP (!) 150/64    Pulse 60    Temp 98.5 F (36.9 C)    Ht _0  (1.651 m)    Wt 206 lb (93.4 kg)    SpO2 97%    BMI 34.28 kg/m  Wt Readings from Last 3 Encounters:  04/16/21 206 lb (93.4 kg)  03/27/21 206 lb (93.4 kg)  02/25/21 205 lb (93 kg)    Health Maintenance Due  Topic Date Due   COLONOSCOPY (Pts 45-53yr Insurance coverage will need to be confirmed)  02/17/2021    There are no preventive care reminders to display for this patient.   No results found for: TSH Lab Results  Component Value Date   WBC 5.0 01/01/2021   HGB 16.0 01/01/2021   HCT 46.5 01/01/2021   MCV 95 01/01/2021   PLT 159 01/01/2021   Lab Results  Component Value Date   NA 140 01/30/2021   K 4.3 01/30/2021   CO2 25 01/30/2021    GLUCOSE 152 (H) 01/30/2021   BUN 15 01/30/2021   CREATININE 1.29 (H) 01/30/2021  BILITOT 0.8 01/01/2021   ALKPHOS 107 01/01/2021   AST 19 01/01/2021   ALT 31 01/01/2021   PROT 6.7 01/01/2021   ALBUMIN 4.5 01/01/2021   CALCIUM 9.3 01/30/2021   EGFR 64 01/30/2021   Lab Results  Component Value Date   CHOL 96 (L) 01/30/2021   Lab Results  Component Value Date   HDL 31 (L) 01/30/2021   Lab Results  Component Value Date   LDLCALC 45 01/30/2021   Lab Results  Component Value Date   TRIG 109 01/30/2021   Lab Results  Component Value Date   CHOLHDL 3.1 01/30/2021   Lab Results  Component Value Date   HGBA1C 6.4 (H) 01/30/2021       Assessment & Plan:  Continue tylenol for pain and discomfort -completed abdominal x-ray with results pending -warm/cool compress as tolerated. -patient knows to follow up with worsening or unresolved symptoms   Problem List Items Addressed This Visit   None Visit Diagnoses     Right sided abdominal pain    -  Primary   Relevant Medications   acetaminophen (TYLENOL) 500 MG tablet   Other Relevant Orders   DG Abd 1 View        Meds ordered this encounter  Medications   acetaminophen (TYLENOL) 500 MG tablet    Sig: Take 1 tablet (500 mg total) by mouth every 6 (six) hours as needed.    Dispense:  30 tablet    Refill:  0    Order Specific Question:   Supervising Provider    Answer:   Claretta Fraise [[id number]]     Ivy Lynn, NP

## 2021-04-16 NOTE — Patient Instructions (Signed)
Flank Pain, Adult ?Flank pain is pain in your side. The flank is the area on your side between your upper belly (abdomen) and your spine. The pain may occur over a short time (acute), or it may be long-term or come back often (chronic). It may be mild or very bad. Pain in this area can be caused by many different things. ?Follow these instructions at home: ? ?Drink enough fluid to keep your pee (urine) pale yellow. ?Rest as told by your doctor. ?Take over-the-counter and prescription medicines only as told by your doctor. ?Keep a journal to keep track of: ?What has caused your flank pain. ?What has made your flank pain feel better. ?Keep all follow-up visits. ?Contact a doctor if: ?Medicine does not help your pain. ?You have new symptoms. ?Your pain gets worse. ?Your symptoms last longer than 2-3 days. ?You have trouble peeing. ?You are peeing more often than normal. ?Get help right away if: ?You have trouble breathing. ?You are short of breath. ?Your belly hurts, or it is swollen or red. ?You feel like you may vomit (nauseous). ?You vomit. ?You feel faint, or you faint. ?You have blood in your pee. ?You have flank pain and a fever. ?These symptoms may be an emergency. Get help right away. Call your local emergency services (911 in the [place] ?Do not wait to see if the symptoms will go away. ?Do not drive yourself to the hospital. ?Summary ?Flank pain is pain in your side. The flank is the area of your side between your upper belly (abdomen) and your spine. ?Flank pain may occur over a short time (acute), or it may be long-term or come back often (chronic). It may be mild or very bad. ?Pain in this area can be caused by many different things. ?Contact your doctor if your symptoms get worse or last longer than 2-3 days. ?This information is not intended to replace advice given to you by your health care provider. Make sure you discuss any questions you have with your health care provider. ?Document Revised:  05/07/2020 Document Reviewed: 05/07/2020 ?Elsevier Patient Education ? 2022 Elsevier Inc. ? ?

## 2021-04-23 ENCOUNTER — Encounter: Payer: Self-pay | Admitting: Nurse Practitioner

## 2021-04-23 ENCOUNTER — Ambulatory Visit (INDEPENDENT_AMBULATORY_CARE_PROVIDER_SITE_OTHER): Payer: Medicare Other

## 2021-04-23 ENCOUNTER — Ambulatory Visit (INDEPENDENT_AMBULATORY_CARE_PROVIDER_SITE_OTHER): Payer: Medicare Other | Admitting: Nurse Practitioner

## 2021-04-23 VITALS — BP 146/70 | HR 56 | Temp 98.0°F | Resp 20 | Ht 65.0 in | Wt 207.0 lb

## 2021-04-23 DIAGNOSIS — R109 Unspecified abdominal pain: Secondary | ICD-10-CM | POA: Diagnosis not present

## 2021-04-23 MED ORDER — CYCLOBENZAPRINE HCL 10 MG PO TABS: 10.0000 mg | ORAL_TABLET | Freq: Three times a day (TID) | ORAL | 1 refills | Status: DC | PRN

## 2021-04-23 MED ORDER — PREDNISONE 20 MG PO TABS: 40.0000 mg | ORAL_TABLET | Freq: Every day | ORAL | 0 refills | Status: AC

## 2021-04-23 NOTE — Patient Instructions (Signed)
Flank Pain, Adult ?Flank pain is pain that is located on the side of the body between the upper abdomen and the spine. This area is called the flank. The pain may occur over a short period of time (acute), or it may be long-term or recurring (chronic). It may be mild or severe. Flank pain can be caused by many things, including: ?Muscle soreness or injury. ?Kidney infection, kidney stones, or kidney disease. ?Stress. ?A disease of the spine (vertebral disk disease). ?A lung infection (pneumonia). ?Fluid around the lungs (pulmonary edema). ?A skin rash caused by the chickenpox virus (shingles). ?Tumors that affect the back of the abdomen. ?Gallbladder disease. ?Follow these instructions at home: ? ?Drink enough fluid to keep your urine pale yellow. ?Rest as told by your health care provider. ?Take over-the-counter and prescription medicines only as told by your health care provider. ?Keep a journal to track what has caused your flank pain and what has made it feel better. ?Keep all follow-up visits. This is important. ?Contact a health care provider if: ?Your pain is not controlled with medicine. ?You have new symptoms. ?Your pain gets worse. ?Your symptoms last longer than 2-3 days. ?You have trouble urinating or you are urinating very frequently. ?Get help right away if: ?You have trouble breathing or you are short of breath. ?Your abdomen hurts or it is swollen or red. ?You have nausea or vomiting. ?You feel faint, or you faint. ?You have blood in your urine. ?You have flank pain and a fever. ?These symptoms may represent a serious problem that is an emergency. Do not wait to see if the symptoms will go away. Get medical help right away. Call your local emergency services (911 in the [place] Do not drive yourself to the hospital. ?Summary ?Flank pain is pain that is located on the side of the body between the upper abdomen and the spine. ?The pain may occur over a short period of time (acute), or it may be  long-term or recurring (chronic). It may be mild or severe. ?Flank pain can be caused by many things. ?Contact your health care provider if your symptoms get worse or last longer than 2-3 days. ?This information is not intended to replace advice given to you by your health care provider. Make sure you discuss any questions you have with your health care provider. ?Document Revised: 05/07/2020 Document Reviewed: 05/07/2020 ?Elsevier Patient Education ? 2022 Elsevier Inc. ? ?

## 2021-04-23 NOTE — Progress Notes (Signed)
° °  Subjective:    Patient ID: Bruce Guerrero, male    DOB: 09-19-1960, 61 y.o.   MRN: [id number]   Chief Complaint: Middle back pain and Flank Pain (Right)   Flank Pain Pertinent negatives include no fever.  Patient comes is in today c/o of pain right upper flank that started 2 weeks ago and now has spread to his back. He saw Onyeje with same complaint and she told him to tae tylenol. No better. Rates pain 4/10 currently. Nothing particular makes it worse. Sitting still helps some.  Denies injury that he is aware of.   Review of Systems  Constitutional:  Negative for chills and fever.  Respiratory: Negative.    Cardiovascular: Negative.   Genitourinary:  Positive for flank pain.  Neurological: Negative.   Psychiatric/Behavioral: Negative.        Objective:   Physical Exam Vitals reviewed.  Constitutional:      Appearance: Normal appearance.  Cardiovascular:     Rate and Rhythm: Normal rate and regular rhythm.     Heart sounds: Normal heart sounds.  Pulmonary:     Breath sounds: Normal breath sounds.     Comments: Pain on palpation right flank. Skin:    General: Skin is warm.     Findings: No rash.  Neurological:     General: No focal deficit present.     Mental Status: He is alert and oriented to person, place, and time.  Psychiatric:        Mood and Affect: Mood normal.        Behavior: Behavior normal.   BP (!) 146/70    Pulse (!) 56    Temp 98 F (36.7 C) (Temporal)    Resp 20    Ht 5\' 5"  (1.651 m)    Wt 207 lb (93.9 kg)    SpO2 99%    BMI 34.45 kg/m   Chest xray negative-Preliminary reading by , FNP  [facility]        Assessment & Plan:  [facility] in today with chief complaint of Middle back pain and Flank Pain (Right)   1. Right flank pain Moist heat rest - DG Chest 2 View - predniSONE (DELTASONE) 20 MG tablet; Take 2 tablets (40 mg total) by mouth daily with breakfast for 5 days. 2 po daily for 5 days  Dispense: 10 tablet; Refill:  0 - cyclobenzaprine (FLEXERIL) 10 MG tablet; Take 1 tablet (10 mg total) by mouth 3 (three) times daily as needed for muscle spasms.  Dispense: 30 tablet; Refill: 1    The above assessment and management plan was discussed with the patient. The patient verbalized understanding of and has agreed to the management plan. Patient is aware to call the clinic if symptoms persist or worsen. Patient is aware when to return to the clinic for a follow-up visit. Patient educated on when it is appropriate to go to the emergency department.   Mary-Margaret [name], FNP

## 2021-04-29 ENCOUNTER — Other Ambulatory Visit: Payer: Self-pay | Admitting: Nurse Practitioner

## 2021-04-29 DIAGNOSIS — R109 Unspecified abdominal pain: Secondary | ICD-10-CM

## 2021-05-03 ENCOUNTER — Encounter: Payer: Self-pay | Admitting: Gastroenterology

## 2021-05-06 DIAGNOSIS — H524 Presbyopia: Secondary | ICD-10-CM | POA: Diagnosis not present

## 2021-05-08 ENCOUNTER — Encounter: Payer: Self-pay | Admitting: Certified Registered Nurse Anesthetist

## 2021-05-09 ENCOUNTER — Ambulatory Visit (AMBULATORY_SURGERY_CENTER): Payer: Medicare Other | Admitting: Gastroenterology

## 2021-05-09 ENCOUNTER — Encounter: Payer: Self-pay | Admitting: Gastroenterology

## 2021-05-09 VITALS — BP 175/78 | HR 57 | Temp 97.5°F | Resp 11 | Ht 64.0 in | Wt 206.0 lb

## 2021-05-09 DIAGNOSIS — Z1211 Encounter for screening for malignant neoplasm of colon: Secondary | ICD-10-CM

## 2021-05-09 DIAGNOSIS — I10 Essential (primary) hypertension: Secondary | ICD-10-CM | POA: Diagnosis not present

## 2021-05-09 DIAGNOSIS — Z800 Family history of malignant neoplasm of digestive organs: Secondary | ICD-10-CM | POA: Diagnosis not present

## 2021-05-09 DIAGNOSIS — E7800 Pure hypercholesterolemia, unspecified: Secondary | ICD-10-CM | POA: Diagnosis not present

## 2021-05-09 MED ORDER — SODIUM CHLORIDE 0.9 % IV SOLN: 500.0000 mL | Freq: Once | INTRAVENOUS | Status: DC

## 2021-05-09 NOTE — Progress Notes (Signed)
C.W. vital signs. 

## 2021-05-09 NOTE — Progress Notes (Signed)
Report given to PACU, vss 

## 2021-05-09 NOTE — Progress Notes (Signed)
Pt's states no medical or surgical changes since previsit or office visit. 

## 2021-05-09 NOTE — Op Note (Signed)
[facility] ?Patient Name: Bruce Guerrero ?Procedure Date: 05/09/2021 10:55 AM ?MRN: [id number] ?Endoscopist: Starr Lake. Myrtie Neither , MD ?Age: 61 ?Referring MD:  ?Date of Birth: 1961/02/23 ?Gender: Male ?Account #: [id number] ?Procedure:                Colonoscopy ?Indications:              Colon cancer screening in patient at increased  ?                          risk: Colorectal cancer in sister ?                          no polyps last colonoscopy Dec 2017 ?Medicines:                Monitored Anesthesia Care ?Procedure:                Pre-Anesthesia Assessment: ?                          - Prior to the procedure, a History and Physical  ?                          was performed, and patient medications and  ?                          allergies were reviewed. The patient's tolerance of  ?                          previous anesthesia was also reviewed. The risks  ?                          and benefits of the procedure and the sedation  ?                          options and risks were discussed with the patient.  ?                          All questions were answered, and informed consent  ?                          was obtained. Prior Anticoagulants: The patient has  ?                          taken Plavix (clopidogrel), last dose was 5 days  ?                          prior to procedure. ASA Grade Assessment: III - A  ?                          patient with severe systemic disease. After  ?                          reviewing the risks and benefits, the patient was  ?  deemed in satisfactory condition to undergo the  ?                          procedure. ?                          After obtaining informed consent, the colonoscope  ?                          was passed under direct vision. Throughout the  ?                          procedure, the patient's blood pressure, pulse, and  ?                          oxygen saturations were monitored continuously. The  ?                           CF [id number] #[id number] was introduced through the anus  ?                          and advanced to the the terminal ileum, with  ?                          identification of the appendiceal orifice and IC  ?                          valve. The colonoscopy was performed without  ?                          difficulty. The patient tolerated the procedure  ?                          well. The quality of the bowel preparation was  ?                          excellent. The ileocecal valve, appendiceal  ?                          orifice, and rectum were photographed. ?Scope In: 11:09:46 AM ?Scope Out: 11:21:48 AM ?Scope Withdrawal Time: 0 hours 9 minutes 18 seconds  ?Total Procedure Duration: 0 hours 12 minutes 2 seconds  ?Findings:                 The perianal and digital rectal examinations were  ?                          normal. ?                          The terminal ileum appeared normal. ?                          Repeat examination of right colon under NBI  ?  performed. ?                          Internal hemorrhoids were found. ?                          The exam was otherwise without abnormality on  ?                          direct and retroflexion views. ?Complications:            No immediate complications. ?Estimated Blood Loss:     Estimated blood loss: none. ?Impression:               - The examined portion of the ileum was normal. ?                          - Internal hemorrhoids. ?                          - The examination was otherwise normal on direct  ?                          and retroflexion views. ?                          - No specimens collected. ?Recommendation:           - Patient has a contact number available for  ?                          emergencies. The signs and symptoms of potential  ?                          delayed complications were discussed with the  ?                          patient. Return to normal activities tomorrow.  ?                           Written discharge instructions were provided to the  ?                          patient. ?                          - Resume previous diet. ?                          - Resume Plavix (clopidogrel) at prior dose today. ?                          - Repeat colonoscopy in 5 years for screening  ?                          purposes. ?Aundrea Higginbotham L. Myrtie Neither, MD ?05/09/2021 11:28:11 AM ?This report has been signed electronically. ?

## 2021-05-09 NOTE — Patient Instructions (Addendum)
Resume previous medications.  Resume Plavix today.   ? ?Handouts on findings given to patient (hemorrhoids).  ? ?YOU HAD AN ENDOSCOPIC PROCEDURE TODAY AT [facility]:   Refer to the procedure report that was given to you for any specific questions about what was found during the examination.  If the procedure report does not answer your questions, please call your gastroenterologist to clarify.  If you requested that your care partner not be given the details of your procedure findings, then the procedure report has been included in a sealed envelope for you to review at your convenience later. ? ?YOU SHOULD EXPECT: Some feelings of bloating in the abdomen. Passage of more gas than usual.  Walking can help get rid of the air that was put into your GI tract during the procedure and reduce the bloating. If you had a lower endoscopy (such as a colonoscopy or flexible sigmoidoscopy) you may notice spotting of blood in your stool or on the toilet paper. If you underwent a bowel prep for your procedure, you may not have a normal bowel movement for a few days. ? ?Please Note:  You might notice some irritation and congestion in your nose or some drainage.  This is from the oxygen used during your procedure.  There is no need for concern and it should clear up in a day or so. ? ?SYMPTOMS TO REPORT IMMEDIATELY: ? ?Following lower endoscopy (colonoscopy or flexible sigmoidoscopy): ? Excessive amounts of blood in the stool ? Significant tenderness or worsening of abdominal pains ? Swelling of the abdomen that is new, acute ? Fever of 100?F or higher ? ? ?For urgent or emergent issues, a gastroenterologist can be reached at any hour by calling [phone]. ?Do not use MyChart messaging for urgent concerns.  ? ? ?DIET:  We do recommend a small meal at first, but then you may proceed to your regular diet.  Drink plenty of fluids but you should avoid alcoholic beverages for 24 hours. ? ?ACTIVITY:  You should  plan to take it easy for the rest of today and you should NOT DRIVE or use heavy machinery until tomorrow (because of the sedation medicines used during the test).   ? ?FOLLOW UP: ?Our staff will call the number listed on your records 48-72 hours following your procedure to check on you and address any questions or concerns that you may have regarding the information given to you following your procedure. If we do not reach you, we will leave a message.  We will attempt to reach you two times.  During this call, we will ask if you have developed any symptoms of COVID 19. If you develop any symptoms (ie: fever, flu-like symptoms, shortness of breath, cough etc.) before then, please call [phone].  If you test positive for Covid 19 in the 2 weeks post procedure, please call and report this information to [name].   ? ?If any biopsies were taken you will be contacted by phone or by letter within the next 1-3 weeks.  Please call us at [phone] if you have not heard about the biopsies in 3 weeks.  ? ? ?SIGNATURES/CONFIDENTIALITY: ?You and/or your care partner have signed paperwork which will be entered into your electronic medical record.  These signatures attest to the fact that that the information above on your After Visit Summary has been reviewed and is understood.  Full responsibility of the confidentiality of this discharge information lies with you and/or your care-partner.  ?

## 2021-05-09 NOTE — Progress Notes (Signed)
History and Physical: ? This patient presents for endoscopic testing for: ?Encounter Diagnosis  ?Name Primary?  ? Family history of colon cancer Yes  ? ? ?Last colonoscopy 02/2016 - sister had CRC. ?Held plavix 5 days for this procedure ?Seen in [place] office 03/27/21 ? ?ROS: ?Patient denies chest pain or shortness of breath ? ? ?Past Medical History: ?Past Medical History:  ?Diagnosis Date  ? Gout   ? Hyperlipidemia   ? Hypertension   ? Stroke [facility]) 2014  ? ? ? ?Past Surgical History: ?Past Surgical History:  ?Procedure Laterality Date  ? NO PAST SURGERIES    ? ? ?Allergies: ?No Known Allergies ? ?Outpatient Meds: ?Current Outpatient Medications  ?Medication Sig Dispense Refill  ? allopurinol (ZYLOPRIM) 300 MG tablet TAKE 1 TABLET BY MOUTH EVERY DAY 90 tablet 1  ? amLODipine (NORVASC) 10 MG tablet TAKE 1 TABLET BY MOUTH EVERY DAY 90 tablet 0  ? atorvastatin (LIPITOR) 80 MG tablet TAKE 1 TABLET BY MOUTH DAILY AT 6PM 90 tablet 1  ? carvedilol (COREG) 6.25 MG tablet TAKE 1 TABLET BY MOUTH 2 TIMES DAILY WITH A MEAL. 180 tablet 1  ? colchicine 0.6 MG tablet TAKE 1 TABLET BY MOUTH EVERY DAY 90 tablet 1  ? sertraline (ZOLOFT) 50 MG tablet TAKE 1 TABLET BY MOUTH EVERY DAY 90 tablet 0  ? terbinafine (LAMISIL) 250 MG tablet Take 1 tablet (250 mg total) by mouth daily. 90 tablet 0  ? acetaminophen (TYLENOL) 500 MG tablet Take 1 tablet (500 mg total) by mouth every 6 (six) hours as needed. 30 tablet 0  ? clopidogrel (PLAVIX) 75 MG tablet TAKE 1 TABLET BY MOUTH EVERY DAY 90 tablet 0  ? cyclobenzaprine (FLEXERIL) 10 MG tablet Take 1 tablet (10 mg total) by mouth 3 (three) times daily as needed for muscle spasms. 30 tablet 1  ? ?Current Facility-Administered Medications  ?Medication Dose Route Frequency Provider Last Rate Last Admin  ? 0.9 %  sodium chloride infusion  500 mL Intravenous Once Doran Stabler, MD      ? ? ? ? ?___________________________________________________________________ ?Objective  ? ?Exam: ? ?BP (!) 146/71    Pulse 62   Temp (!) 97.5 ?F (36.4 ?C)   Ht 5\' 4"  (1.626 m)   Wt 206 lb (93.4 kg)   SpO2 97%   BMI 35.36 kg/m?  ? ?CV: RRR without murmur, S1/S2 ?Resp: clear to auscultation bilaterally, normal RR and effort noted ?GI: soft, no tenderness, with active bowel sounds. ? ? ?Assessment: ?Encounter Diagnosis  ?Name Primary?  ? Family history of colon cancer Yes  ? ? ? ?Plan: ?Colonoscopy ? The benefits and risks of the planned procedure were described in detail with the patient or (when appropriate) their health care proxy.  Risks were outlined as including, but not limited to, bleeding, infection, perforation, adverse medication reaction leading to cardiac or pulmonary decompensation, pancreatitis (if ERCP).  The limitation of incomplete mucosal visualization was also discussed.  No guarantees or warranties were given. ? ? ? ?The patient is appropriate for an endoscopic procedure in the ambulatory setting. ? ? - Wilfrid Lund, MD ? ? ? ? ?

## 2021-05-13 ENCOUNTER — Telehealth: Payer: Self-pay | Admitting: *Deleted

## 2021-05-13 NOTE — Telephone Encounter (Signed)
?  Follow up Call- ? ?Call back number 05/09/2021  ?Post procedure Call Back phone  # [phone]  ?Permission to leave phone message Yes  ?Some recent data might be hidden  ?  ? ?Patient questions: ? ?Do you have a fever, pain , or abdominal swelling? No. ?Pain Score  0 * ? ?Have you tolerated food without any problems? Yes.   ? ?Have you been able to return to your normal activities? Yes.   ? ?Do you have any questions about your discharge instructions: ?Diet   No. ?Medications  No. ?Follow up visit  No. ? ?Do you have questions or concerns about your Care? No. ? ?Actions: ?* If pain score is 4 or above: ?No action needed, pain <4. ? ? ?

## 2021-05-19 ENCOUNTER — Other Ambulatory Visit: Payer: Self-pay | Admitting: Family Medicine

## 2021-05-19 DIAGNOSIS — I10 Essential (primary) hypertension: Secondary | ICD-10-CM

## 2021-05-19 DIAGNOSIS — F411 Generalized anxiety disorder: Secondary | ICD-10-CM

## 2021-05-24 ENCOUNTER — Other Ambulatory Visit: Payer: Self-pay | Admitting: Family Medicine

## 2021-05-24 DIAGNOSIS — B351 Tinea unguium: Secondary | ICD-10-CM

## 2021-05-25 NOTE — Telephone Encounter (Signed)
Last office visit 02/25/21 ?Upcoming appointment 08/01/21 ?Last refill 02/25/21, #90, no refills ?

## 2021-06-10 ENCOUNTER — Encounter: Payer: Self-pay | Admitting: Family Medicine

## 2021-06-10 ENCOUNTER — Ambulatory Visit (INDEPENDENT_AMBULATORY_CARE_PROVIDER_SITE_OTHER): Payer: Medicare Other | Admitting: Family Medicine

## 2021-06-10 VITALS — BP 137/61 | HR 64 | Temp 97.4°F | Ht 64.0 in | Wt 195.4 lb

## 2021-06-10 DIAGNOSIS — H1032 Unspecified acute conjunctivitis, left eye: Secondary | ICD-10-CM | POA: Diagnosis not present

## 2021-06-10 MED ORDER — TOBRAMYCIN-DEXAMETHASONE 0.3-0.1 % OP SUSP: OPHTHALMIC | 0 refills | Status: DC

## 2021-06-10 NOTE — Progress Notes (Signed)
Chief Complaint  ?Patient presents with  ? Conjunctivitis  ?  Left eye  ? ? ?HPI ? ?Patient presents today for redness, watery & irritation of the left eye starting last night. UR sx for about 5 days are getting better otherwise.  No fever. NO change in vision.  ? ?PMH: Smoking status noted ?ROS: Per HPI ? ?Objective: ?BP 137/61   Pulse 64   Temp (!) 97.4 ?F (36.3 ?C)   Ht 5\' 4"  (1.626 m)   Wt 195 lb 6.4 oz (88.6 kg)   SpO2 96%   BMI 33.54 kg/m?  ?Gen: NAD, alert, cooperative with exam ?HEENT: NCAT, EOMI, PERRL ?CV: RRR, good S1/S2, no murmur ?Resp: CTABL, no wheezes, non-labored ?Ext: No edema, warm ?Neuro: Alert and oriented, No gross deficits ? ?Assessment and plan: ? ?1. Acute bacterial conjunctivitis of left eye   ? ? ?Meds ordered this encounter  ?Medications  ? tobramycin-dexamethasone (TOBRADEX) ophthalmic solution  ?  Sig: Apply 1 drop in affected eye(s) every 2 hours for two days. Then every 4 hours for 5 days.  ?  Dispense:  5 mL  ?  Refill:  0  ? ? ?No orders of the defined types were placed in this encounter. ? ? ?Follow up as needed. ? ? , MD ? ? ? ? ?

## 2021-06-19 DIAGNOSIS — L570 Actinic keratosis: Secondary | ICD-10-CM | POA: Diagnosis not present

## 2021-06-27 ENCOUNTER — Ambulatory Visit (INDEPENDENT_AMBULATORY_CARE_PROVIDER_SITE_OTHER): Payer: Medicare Other | Admitting: Nurse Practitioner

## 2021-06-27 ENCOUNTER — Encounter: Payer: Self-pay | Admitting: Nurse Practitioner

## 2021-06-27 ENCOUNTER — Ambulatory Visit (INDEPENDENT_AMBULATORY_CARE_PROVIDER_SITE_OTHER): Payer: Medicare Other

## 2021-06-27 VITALS — BP 114/56 | HR 57 | Temp 97.8°F | Resp 20 | Ht 64.0 in | Wt 194.0 lb

## 2021-06-27 DIAGNOSIS — M79672 Pain in left foot: Secondary | ICD-10-CM | POA: Diagnosis not present

## 2021-06-27 MED ORDER — MELOXICAM 15 MG PO TABS: 15.0000 mg | ORAL_TABLET | Freq: Every day | ORAL | 0 refills | Status: DC

## 2021-06-27 NOTE — Patient Instructions (Signed)
RICE Therapy for Routine Care of Injuries Many injuries can be cared for with rest, ice, compression, and elevation (RICE therapy). This includes: Resting the injured body part. Putting ice on the injury. Putting pressure (compression) on the injury. Raising the injured part (elevation). Using RICE therapy can help to lessen pain and swelling. Supplies needed: Ice. Plastic bag. Towel. Elastic bandage. Pillow or pillows to raise your injured body part. How to care for your injury with RICE therapy Rest Try to rest the injured part of your body. You can go back to your normal activities when your doctor says it is okay to do them and when you can do them without pain. If you rest the injury too much, it may not heal as well. Some injuries heal better with early movement instead of resting for too long. Ask your doctor if you should do exercises to help your injury get better. Ice  If told, put ice on the injured area. To do this: Put ice in a plastic bag. Place a towel between your skin and the bag. Leave the ice on for 20 minutes, 2-3 times a day. Take off the ice if your skin turns bright red. This is very important. If you cannot feel pain, heat, or cold, you have a greater risk of damage to the area. Do not put ice on your bare skin. Use ice for as many days as your doctor tells you to use it. Compression Put pressure on the injured area. This can be done with an elastic bandage. If this type of bandage has been put on your injury: Follow instructions on the package the bandage came in about how to use it. Do not wrap the bandage too tightly. Wrap the bandage more loosely if part of your body beyond the bandage is blue, swollen, cold, painful, or loses feeling. Take off the bandage and put it on again every 3-4 hours or as told by your doctor. See your doctor if the bandage seems to make your problems worse.  Elevation Raise the injured area above the level of your heart while you  are sitting or lying down. Follow these instructions at home: If your symptoms get worse or last a long time, make a follow-up appointment with your doctor. You may need to have imaging tests, such as X-rays or an MRI. If you have imaging tests, ask how to get your results when they are ready. Return to your normal activities when your doctor says that it is safe. Keep all follow-up visits. Contact a doctor if: You keep having pain and swelling. Your symptoms get worse. Get help right away if: You have sudden, very bad pain at your injury or lower than your injury. You have redness or more swelling around your injury. You have tingling or numbness at your injury or lower than your injury, and it does not go away when you take off the bandage. Summary Many injuries can be cared for using rest, ice, compression, and elevation (RICE therapy). You can go back to your normal activities when your doctor says it is okay and when you can do them without pain. Put ice on the injured area as told by your doctor. Get help if your symptoms get worse or if you keep having pain and swelling. This information is not intended to replace advice given to you by your health care provider. Make sure you discuss any questions you have with your health care provider. Document Revised: 12/15/2019 Document Reviewed: 12/15/2019   Elsevier Patient Education  2023 [employer]  
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
## 2021-06-29 ENCOUNTER — Other Ambulatory Visit: Payer: Self-pay | Admitting: Family Medicine

## 2021-06-29 DIAGNOSIS — I10 Essential (primary) hypertension: Secondary | ICD-10-CM

## 2021-07-18 ENCOUNTER — Ambulatory Visit (INDEPENDENT_AMBULATORY_CARE_PROVIDER_SITE_OTHER): Payer: Medicare Other | Admitting: Family Medicine

## 2021-07-18 ENCOUNTER — Encounter: Payer: Self-pay | Admitting: Family Medicine

## 2021-07-18 VITALS — BP 109/54 | HR 57 | Temp 97.5°F | Ht 64.0 in | Wt 194.4 lb

## 2021-07-18 DIAGNOSIS — E119 Type 2 diabetes mellitus without complications: Secondary | ICD-10-CM

## 2021-07-18 DIAGNOSIS — H538 Other visual disturbances: Secondary | ICD-10-CM

## 2021-07-18 LAB — GLUCOSE HEMOCUE WAIVED: Glu Hemocue Waived: 257 mg/dL — ABNORMAL HIGH (ref 70–99)

## 2021-07-18 NOTE — Progress Notes (Signed)
? ?Subjective:  ?Patient ID: Bruce Guerrero, male    DOB: 28-Jun-1960  Age: 61 y.o. MRN: [id number] ? ?CC: Loss of Vision ? ? ?HPI ?Bruce Guerrero presents for two weeks of increasing trouble reading. Tried reading glasses. Working well. Stopped meloxicam and nothing changed. Can see normally for driving. Changes are bilateral and equal. Concerned that he has developed diabetes and this is a symptom. Denies frequency,  polyuria, and polyphagia.  ? ? ?  07/18/2021  ?  9:39 AM 06/27/2021  ?  8:35 AM 06/10/2021  ?  2:43 PM  ?Depression screen PHQ 2/9  ?Decreased Interest 0 0 0  ?Down, Depressed, Hopeless 0 0 0  ?PHQ - 2 Score 0 0 0  ?Altered sleeping  0   ?Tired, decreased energy  0   ?Change in appetite  0   ?Feeling bad or failure about yourself   0   ?Trouble concentrating  0   ?Moving slowly or fidgety/restless  0   ?Suicidal thoughts  0   ?PHQ-9 Score  0   ?Difficult doing work/chores  Not difficult at all   ? ? ?History ?Bruce Guerrero has a past medical history of Gout, Hyperlipidemia, Hypertension, and Stroke (HCC) (2014).  ? ?He has a past surgical history that includes No past surgeries.  ? ?His family history includes COPD in his father and mother; Cancer in his sister; Colon cancer in his sister; Diabetes in his brother; Hyperlipidemia in his brother.He reports that he has never smoked. He has never used smokeless tobacco. He reports that he does not drink alcohol and does not use drugs. ? ? ? ?ROS ?Review of Systems  ?Constitutional:  Negative for fever.  ?Eyes:  Positive for visual disturbance. Negative for photophobia, pain, discharge, redness and itching.  ?Respiratory:  Negative for shortness of breath.   ?Cardiovascular:  Negative for chest pain.  ?Musculoskeletal:  Negative for arthralgias.  ?Skin:  Negative for rash.  ? ?Objective:  ?BP (!) 109/54   Pulse (!) 57   Temp (!) 97.5 ?F (36.4 ?C)   Ht 5\' 4"  (1.626 m)   Wt 194 lb 6.4 oz (88.2 kg)   SpO2 97%   BMI 33.37 kg/m?  ? ?BP Readings from Last 3  Encounters:  ?07/18/21 (!) 109/54  ?06/27/21 (!) 114/56  ?06/10/21 137/61  ? ? ?Wt Readings from Last 3 Encounters:  ?07/18/21 194 lb 6.4 oz (88.2 kg)  ?06/27/21 194 lb (88 kg)  ?06/10/21 195 lb 6.4 oz (88.6 kg)  ? ? ? ?Physical Exam ?Vitals reviewed.  ?Constitutional:   ?   Appearance: He is well-developed.  ?HENT:  ?   Head: Normocephalic and atraumatic.  ?   Right Ear: External ear normal.  ?   Left Ear: External ear normal.  ?   Mouth/Throat:  ?   Pharynx: No oropharyngeal exudate or posterior oropharyngeal erythema.  ?Eyes:  ?   Pupils: Pupils are equal, round, and reactive to light.  ?Cardiovascular:  ?   Rate and Rhythm: Normal rate and regular rhythm.  ?   Heart sounds: No murmur heard. ?Pulmonary:  ?   Effort: No respiratory distress.  ?   Breath sounds: Normal breath sounds.  ?Musculoskeletal:  ?   Cervical back: Normal range of motion and neck supple.  ?Neurological:  ?   Mental Status: He is alert and oriented to person, place, and time.  ? ? ?Glucose = 257 ? ?Assessment & Plan:  ? ?Bruce Guerrero was seen today for loss  of vision. ? ?Diagnoses and all orders for this visit: ? ?Blurred vision, bilateral ?-     Glucose Hemocue Waived ? ?Diabetes mellitus, new onset (HCC) ? ? ?Pt. Needs to see eye doctor ASAP. He will contact today.   ?NEeds to see PCP soon due to new onset DM. He should follow low carb diet. This was reviewed with him ? ? ? ? ?I am having Bruce Guerrero "Bruce Guerrero" maintain his colchicine, carvedilol, allopurinol, atorvastatin, acetaminophen, cyclobenzaprine, clopidogrel, sertraline, terbinafine, tobramycin-dexamethasone, meloxicam, and amLODipine. ? ?Allergies as of 07/18/2021   ?No Known Allergies ?  ? ?  ?Medication List  ?  ? ?  ? Accurate as of Jul 18, 2021  8:48 PM. If you have any questions, ask your nurse or doctor.  ?  ?  ? ?  ? ?acetaminophen 500 MG tablet ?Commonly known as: TYLENOL ?Take 1 tablet (500 mg total) by mouth every 6 (six) hours as needed. ?  ?allopurinol 300 MG  tablet ?Commonly known as: ZYLOPRIM ?TAKE 1 TABLET BY MOUTH EVERY DAY ?  ?amLODipine 10 MG tablet ?Commonly known as: NORVASC ?TAKE 1 TABLET BY MOUTH EVERY DAY ?  ?atorvastatin 80 MG tablet ?Commonly known as: LIPITOR ?TAKE 1 TABLET BY MOUTH DAILY AT 6PM ?  ?carvedilol 6.25 MG tablet ?Commonly known as: COREG ?TAKE 1 TABLET BY MOUTH 2 TIMES DAILY WITH A MEAL. ?  ?clopidogrel 75 MG tablet ?Commonly known as: PLAVIX ?TAKE 1 TABLET BY MOUTH EVERY DAY ?  ?colchicine 0.6 MG tablet ?TAKE 1 TABLET BY MOUTH EVERY DAY ?  ?cyclobenzaprine 10 MG tablet ?Commonly known as: FLEXERIL ?Take 1 tablet (10 mg total) by mouth 3 (three) times daily as needed for muscle spasms. ?  ?meloxicam 15 MG tablet ?Commonly known as: MOBIC ?Take 1 tablet (15 mg total) by mouth daily. ?  ?sertraline 50 MG tablet ?Commonly known as: ZOLOFT ?TAKE 1 TABLET BY MOUTH EVERY DAY ?  ?terbinafine 250 MG tablet ?Commonly known as: LAMISIL ?TAKE 1 TABLET BY MOUTH EVERY DAY ?  ?tobramycin-dexamethasone ophthalmic solution ?Commonly known as: TobraDex ?Apply 1 drop in affected eye(s) every 2 hours for two days. Then every 4 hours for 5 days. ?  ? ?  ? ? ? ?Follow-up: Return in about 2 weeks (around 08/01/2021) for with PCP for DM care. ? ?Mechele Claude, M.D. ?

## 2021-07-24 ENCOUNTER — Other Ambulatory Visit: Payer: Self-pay | Admitting: Nurse Practitioner

## 2021-07-24 DIAGNOSIS — H524 Presbyopia: Secondary | ICD-10-CM | POA: Diagnosis not present

## 2021-07-24 DIAGNOSIS — M79672 Pain in left foot: Secondary | ICD-10-CM

## 2021-08-01 ENCOUNTER — Ambulatory Visit (INDEPENDENT_AMBULATORY_CARE_PROVIDER_SITE_OTHER): Payer: Medicare Other | Admitting: Family Medicine

## 2021-08-01 ENCOUNTER — Encounter: Payer: Self-pay | Admitting: Family Medicine

## 2021-08-01 VITALS — BP 140/70 | HR 56 | Temp 97.5°F | Ht 64.0 in | Wt 192.0 lb

## 2021-08-01 DIAGNOSIS — F411 Generalized anxiety disorder: Secondary | ICD-10-CM | POA: Diagnosis not present

## 2021-08-01 DIAGNOSIS — M62838 Other muscle spasm: Secondary | ICD-10-CM

## 2021-08-01 DIAGNOSIS — E782 Mixed hyperlipidemia: Secondary | ICD-10-CM

## 2021-08-01 DIAGNOSIS — E1169 Type 2 diabetes mellitus with other specified complication: Secondary | ICD-10-CM

## 2021-08-01 DIAGNOSIS — B351 Tinea unguium: Secondary | ICD-10-CM

## 2021-08-01 DIAGNOSIS — I10 Essential (primary) hypertension: Secondary | ICD-10-CM | POA: Diagnosis not present

## 2021-08-01 DIAGNOSIS — F3341 Major depressive disorder, recurrent, in partial remission: Secondary | ICD-10-CM

## 2021-08-01 DIAGNOSIS — Z125 Encounter for screening for malignant neoplasm of prostate: Secondary | ICD-10-CM

## 2021-08-01 DIAGNOSIS — Z8673 Personal history of transient ischemic attack (TIA), and cerebral infarction without residual deficits: Secondary | ICD-10-CM

## 2021-08-01 DIAGNOSIS — M1A0420 Idiopathic chronic gout, left hand, without tophus (tophi): Secondary | ICD-10-CM | POA: Diagnosis not present

## 2021-08-01 DIAGNOSIS — R7303 Prediabetes: Secondary | ICD-10-CM | POA: Diagnosis not present

## 2021-08-01 LAB — BAYER DCA HB A1C WAIVED: HB A1C (BAYER DCA - WAIVED): 8.6 % — ABNORMAL HIGH (ref 4.8–5.6)

## 2021-08-01 MED ORDER — SERTRALINE HCL 50 MG PO TABS: 50.0000 mg | ORAL_TABLET | Freq: Every day | ORAL | 3 refills | Status: DC

## 2021-08-01 MED ORDER — TERBINAFINE HCL 250 MG PO TABS: 250.0000 mg | ORAL_TABLET | Freq: Every day | ORAL | 1 refills | Status: DC

## 2021-08-01 MED ORDER — AMLODIPINE BESYLATE 10 MG PO TABS: 10.0000 mg | ORAL_TABLET | Freq: Every day | ORAL | 3 refills | Status: DC

## 2021-08-01 MED ORDER — CARVEDILOL 6.25 MG PO TABS: 6.2500 mg | ORAL_TABLET | Freq: Two times a day (BID) | ORAL | 3 refills | Status: DC

## 2021-08-01 MED ORDER — BACLOFEN 10 MG PO TABS: 10.0000 mg | ORAL_TABLET | Freq: Three times a day (TID) | ORAL | 1 refills | Status: DC

## 2021-08-01 MED ORDER — ATORVASTATIN CALCIUM 80 MG PO TABS: ORAL_TABLET | ORAL | 1 refills | Status: DC

## 2021-08-01 MED ORDER — CLOPIDOGREL BISULFATE 75 MG PO TABS: 75.0000 mg | ORAL_TABLET | Freq: Every day | ORAL | 1 refills | Status: DC

## 2021-08-01 MED ORDER — COLCHICINE 0.6 MG PO TABS: 0.6000 mg | ORAL_TABLET | Freq: Every day | ORAL | 1 refills | Status: DC

## 2021-08-01 MED ORDER — ALLOPURINOL 300 MG PO TABS: 300.0000 mg | ORAL_TABLET | Freq: Every day | ORAL | 3 refills | Status: DC

## 2021-08-01 NOTE — Progress Notes (Signed)
BP 140/70   Pulse (!) 56   Temp (!) 97.5 F (36.4 C)   Ht _0  (1.626 m)   Wt 192 lb (87.1 kg)   SpO2 99%   BMI 32.96 kg/m    Subjective:   Patient ID: Bruce Guerrero, male    DOB: March 11, 1960, 61 y.o.   MRN: [id number]  HPI: Bruce Guerrero is a 61 y.o. male presenting on 08/01/2021 for Medical Management of Chronic Issues, Hyperlipidemia, Hypertension, and Prediabetes   HPI Prediabetes  patient comes in today for recheck of his diabetes. Patient has been currently taking no medicine, diet control. Patient is not currently on an ACE inhibitor/ARB. Patient has not seen an ophthalmologist this year. Patient denies any issues with their feet. The symptom started onset as an adult hypertension and hyperlipidemia ARE RELATED TO DM   Hypertension Patient is currently on amlodipine and carvedilol, and their blood pressure today is 140/70. Patient denies any lightheadedness or dizziness. Patient denies headaches, blurred vision, chest pains, shortness of breath, or weakness. Denies any side effects from medication and is content with current medication.   Hyperlipidemia Patient is coming in for recheck of his hyperlipidemia. The patient is currently taking atorvastatin. They deny any issues with myalgias or history of liver damage from it. They deny any focal numbness or weakness or chest pain.   Gout Last attack: Unknown Attacks this year: None Medication: Allopurinol with occasional colchicine as needed Location of attacks: Feet  Anxiety depression recheck Patient currently takes Zoloft for anxiety depression and feels like he is doing well.  He is not irritable and "does not feel like he has to kill anybody".  Delene Ruffini aside he feels like he is doing very well and very happy with the medicine.    08/01/2021    8:28 AM 07/18/2021    9:39 AM 06/27/2021    8:35 AM 06/10/2021    2:43 PM 04/23/2021    9:22 AM  Depression screen PHQ 2/9  Decreased Interest 0 0 0 0 0  Down,  Depressed, Hopeless 0 0 0 0 0  PHQ - 2 Score 0 0 0 0 0  Altered sleeping 0  0    Tired, decreased energy 0  0    Change in appetite 0  0    Feeling bad or failure about yourself  0  0    Trouble concentrating 0  0    Moving slowly or fidgety/restless 0  0    Suicidal thoughts 0  0    PHQ-9 Score 0  0    Difficult doing work/chores Not difficult at all  Not difficult at all       Relevant past medical, surgical, family and social history reviewed and updated as indicated. Interim medical history since our last visit reviewed. Allergies and medications reviewed and updated.  Review of Systems  Constitutional:  Negative for chills and fever.  Eyes:  Negative for visual disturbance.  Respiratory:  Negative for shortness of breath and wheezing.   Cardiovascular:  Negative for chest pain and leg swelling.  Musculoskeletal:  Negative for back pain and gait problem.  Skin:  Negative for rash.  Neurological:  Positive for weakness (Right-sided weakness, chronic). Negative for dizziness and light-headedness.  All other systems reviewed and are negative.  Per HPI unless specifically indicated above   Allergies as of 08/01/2021   No Known Allergies      Medication List        Accurate  as of Aug 01, 2021  8:47 AM. If you have any questions, ask your nurse or doctor.          STOP taking these medications    cyclobenzaprine 10 MG tablet Commonly known as: FLEXERIL Stopped by: Fransisca Kaufmann Lilyonna Steidle, MD   meloxicam 15 MG tablet Commonly known as: MOBIC Stopped by: Fransisca Kaufmann Jakiah Goree, MD       TAKE these medications    acetaminophen 500 MG tablet Commonly known as: TYLENOL Take 1 tablet (500 mg total) by mouth every 6 (six) hours as needed.   allopurinol 300 MG tablet Commonly known as: ZYLOPRIM Take 1 tablet (300 mg total) by mouth daily.   amLODipine 10 MG tablet Commonly known as: NORVASC Take 1 tablet (10 mg total) by mouth daily.   atorvastatin 80 MG  tablet Commonly known as: LIPITOR TAKE 1 TABLET BY MOUTH DAILY AT 6PM   baclofen 10 MG tablet Commonly known as: LIORESAL Take 1 tablet (10 mg total) by mouth 3 (three) times daily. Started by: Worthy Rancher, MD   carvedilol 6.25 MG tablet Commonly known as: COREG Take 1 tablet (6.25 mg total) by mouth 2 (two) times daily with a meal.   clopidogrel 75 MG tablet Commonly known as: PLAVIX Take 1 tablet (75 mg total) by mouth daily.   colchicine 0.6 MG tablet Take 1 tablet (0.6 mg total) by mouth daily.   sertraline 50 MG tablet Commonly known as: ZOLOFT Take 1 tablet (50 mg total) by mouth daily.   terbinafine 250 MG tablet Commonly known as: LAMISIL Take 1 tablet (250 mg total) by mouth daily.   tobramycin-dexamethasone ophthalmic solution Commonly known as: TobraDex Apply 1 drop in affected eye(s) every 2 hours for two days. Then every 4 hours for 5 days.         Objective:   BP 140/70   Pulse (!) 56   Temp (!) 97.5 F (36.4 C)   Ht _0  (1.626 m)   Wt 192 lb (87.1 kg)   SpO2 99%   BMI 32.96 kg/m   Wt Readings from Last 3 Encounters:  08/01/21 192 lb (87.1 kg)  07/18/21 194 lb 6.4 oz (88.2 kg)  06/27/21 194 lb (88 kg)    Physical Exam Vitals and nursing note reviewed.  Constitutional:      General: He is not in acute distress.    Appearance: He is well-developed. He is not diaphoretic.  Eyes:     General: No scleral icterus.    Conjunctiva/sclera: Conjunctivae normal.  Neck:     Thyroid: No thyromegaly.  Cardiovascular:     Rate and Rhythm: Normal rate and regular rhythm.     Heart sounds: Normal heart sounds. No murmur heard. Pulmonary:     Effort: Pulmonary effort is normal. No respiratory distress.     Breath sounds: Normal breath sounds. No wheezing.  Musculoskeletal:     Cervical back: Neck supple.  Lymphadenopathy:     Cervical: No cervical adenopathy.  Skin:    General: Skin is warm and dry.     Findings: No rash.  Neurological:      Mental Status: He is alert and oriented to person, place, and time.     Coordination: Coordination normal.  Psychiatric:        Behavior: Behavior normal.    Results for orders placed or performed in visit on 07/18/21  Glucose Hemocue Waived  Result Value Ref Range   Glu Hemocue Waived 257 (H)  70 - 99 mg/dL    Assessment & Plan:   Problem List Items Addressed This Visit       Cardiovascular and Mediastinum   Essential hypertension - Primary   Relevant Medications   amLODipine (NORVASC) 10 MG tablet   carvedilol (COREG) 6.25 MG tablet   atorvastatin (LIPITOR) 80 MG tablet   Other Relevant Orders   Microalbumin / creatinine urine ratio     Endocrine   Type 2 diabetes mellitus with other specified complication (HCC)   Relevant Medications   atorvastatin (LIPITOR) 80 MG tablet   Other Relevant Orders   CBC with Differential/Platelet   CMP14+EGFR   Lipid panel   Bayer DCA Hb A1c Waived     Other   Hyperlipidemia   Relevant Medications   amLODipine (NORVASC) 10 MG tablet   carvedilol (COREG) 6.25 MG tablet   atorvastatin (LIPITOR) 80 MG tablet   Other Relevant Orders   CBC with Differential/Platelet   CMP14+EGFR   Lipid panel   Gout   Relevant Medications   allopurinol (ZYLOPRIM) 300 MG tablet   colchicine 0.6 MG tablet   Depression   Relevant Medications   sertraline (ZOLOFT) 50 MG tablet   History of stroke   Relevant Medications   clopidogrel (PLAVIX) 75 MG tablet   atorvastatin (LIPITOR) 80 MG tablet   GAD (generalized anxiety disorder)   Relevant Medications   sertraline (ZOLOFT) 50 MG tablet   Other Visit Diagnoses     Onychomycosis       Relevant Medications   terbinafine (LAMISIL) 250 MG tablet   Muscle spasm       Relevant Medications   baclofen (LIORESAL) 10 MG tablet   Prostate cancer screening       Relevant Orders   PSA, total and free       Is under control, continue current medicine.  His nails are doing better, his limits liver  function looks good we will continue with the terbinafine.  Anxiety depression is under control so continue Zoloft.  We will do blood work today.  A1c is 8.6 so patient is now full-blown diabetes, will have discussion about this and focus on diet at this point but if it is not improved in 3 months then we will need to start medication. Follow up plan: Return in about 3 months (around 11/01/2021), or if symptoms worsen or fail to improve, for Diabetes recheck.  Counseling provided for all of the vaccine components Orders Placed This Encounter  Procedures  . CBC with Differential/Platelet  . CMP14+EGFR  . Lipid panel  . Bayer DCA Hb A1c Waived  . Microalbumin / creatinine urine ratio  . PSA, total and free    Caryl Pina, MD [facility] 08/01/2021, 8:47 AM

## 2021-08-02 LAB — CBC WITH DIFFERENTIAL/PLATELET
Basophils Absolute: 0 10*3/uL (ref 0.0–0.2)
Basos: 0 %
EOS (ABSOLUTE): 0.1 10*3/uL (ref 0.0–0.4)
Eos: 1 %
Hematocrit: 42.7 % (ref 37.5–51.0)
Hemoglobin: 14.3 g/dL (ref 13.0–17.7)
Immature Grans (Abs): 0 10*3/uL (ref 0.0–0.1)
Immature Granulocytes: 0 %
Lymphocytes Absolute: 1.6 10*3/uL (ref 0.7–3.1)
Lymphs: 33 %
MCH: 30.7 pg (ref 26.6–33.0)
MCHC: 33.5 g/dL (ref 31.5–35.7)
MCV: 92 fL (ref 79–97)
Monocytes Absolute: 0.4 10*3/uL (ref 0.1–0.9)
Monocytes: 8 %
Neutrophils Absolute: 2.8 10*3/uL (ref 1.4–7.0)
Neutrophils: 58 %
Platelets: 172 10*3/uL (ref 150–450)
RBC: 4.66 x10E6/uL (ref 4.14–5.80)
RDW: 13.4 % (ref 11.6–15.4)
WBC: 4.8 10*3/uL (ref 3.4–10.8)

## 2021-08-02 LAB — CMP14+EGFR
ALT: 23 IU/L (ref 0–44)
AST: 21 IU/L (ref 0–40)
Albumin/Globulin Ratio: 2.4 — ABNORMAL HIGH (ref 1.2–2.2)
Albumin: 4.5 g/dL (ref 3.8–4.9)
Alkaline Phosphatase: 119 IU/L (ref 44–121)
BUN/Creatinine Ratio: 14 (ref 10–24)
BUN: 18 mg/dL (ref 8–27)
Bilirubin Total: 0.5 mg/dL (ref 0.0–1.2)
CO2: 24 mmol/L (ref 20–29)
Calcium: 9.4 mg/dL (ref 8.6–10.2)
Chloride: 101 mmol/L (ref 96–106)
Creatinine, Ser: 1.25 mg/dL (ref 0.76–1.27)
Globulin, Total: 1.9 g/dL (ref 1.5–4.5)
Glucose: 153 mg/dL — ABNORMAL HIGH (ref 70–99)
Potassium: 3.8 mmol/L (ref 3.5–5.2)
Sodium: 139 mmol/L (ref 134–144)
Total Protein: 6.4 g/dL (ref 6.0–8.5)
eGFR: 66 mL/min/{1.73_m2} (ref >59)

## 2021-08-02 LAB — MICROALBUMIN / CREATININE URINE RATIO
Creatinine, Urine: 194.9 mg/dL
Microalb/Creat Ratio: 39 mg/g creat — ABNORMAL HIGH (ref 0–29)
Microalbumin, Urine: 76.7 ug/mL

## 2021-08-02 LAB — LIPID PANEL
Chol/HDL Ratio: 2.9 ratio (ref 0.0–5.0)
Cholesterol, Total: 90 mg/dL — ABNORMAL LOW (ref 100–199)
HDL: 31 mg/dL — ABNORMAL LOW (ref >39)
LDL Chol Calc (NIH): 37 mg/dL (ref 0–99)
Triglycerides: 118 mg/dL (ref 0–149)
VLDL Cholesterol Cal: 22 mg/dL (ref 5–40)

## 2021-08-03 LAB — SPECIMEN STATUS REPORT

## 2021-08-03 LAB — PSA: Prostate Specific Ag, Serum: 0.4 ng/mL (ref 0.0–4.0)

## 2021-09-19 ENCOUNTER — Ambulatory Visit (INDEPENDENT_AMBULATORY_CARE_PROVIDER_SITE_OTHER): Payer: Medicare Other

## 2021-09-19 VITALS — Wt 192.0 lb

## 2021-09-19 DIAGNOSIS — Z0000 Encounter for general adult medical examination without abnormal findings: Secondary | ICD-10-CM

## 2021-09-19 NOTE — Patient Instructions (Signed)
Bruce Guerrero , Thank you for taking time to come for your Medicare Wellness Visit. I appreciate your ongoing commitment to your health goals. Please review the following plan we discussed and let me know if I can assist you in the future.   Screening recommendations/referrals: Colonoscopy: Done 05/09/2021 - Repeat in 5 years  Recommended yearly ophthalmology/optometry visit for glaucoma screening and checkup Recommended yearly dental visit for hygiene and checkup  Vaccinations: Influenza vaccine: Done 12/04/2020 - Repeat annually Pneumococcal vaccine: Done 10/16/2020 Tdap vaccine: Done 10/10/2013 - Repeat in 10 years Shingles vaccine: Done  11/01/2020 & 03/12/2021   Covid-19: Done 05/26/2019, 06/24/2019, 01/12/2020, 10/04/2020, 12/27/2020  Advanced directives: Advance directive discussed with you today. Even though you declined this today, please call our office should you change your mind, and we can give you the proper paperwork for you to fill out.   Conditions/risks identified: Aim for 30 minutes of exercise or brisk walking, 6-8 glasses of water, and 5 servings of fruits and vegetables each day.   Next appointment: Follow up in one year for your annual wellness visit   Preventive Care 40-64 Years, Male Preventive care refers to lifestyle choices and visits with your health care provider that can promote health and wellness. What does preventive care include? A yearly physical exam. This is also called an annual well check. Dental exams once or twice a year. Routine eye exams. Ask your health care provider how often you should have your eyes checked. Personal lifestyle choices, including: Daily care of your teeth and gums. Regular physical activity. Eating a healthy diet. Avoiding tobacco and drug use. Limiting alcohol use. Practicing safe sex. Taking low-dose aspirin every day starting at age 18. What happens during an annual well check? The services and screenings done by your health care  provider during your annual well check will depend on your age, overall health, lifestyle risk factors, and family history of disease. Counseling  Your health care provider may ask you questions about your: Alcohol use. Tobacco use. Drug use. Emotional well-being. Home and relationship well-being. Sexual activity. Eating habits. Work and work Astronomer. Screening  You may have the following tests or measurements: Height, weight, and BMI. Blood pressure. Lipid and cholesterol levels. These may be checked every 5 years, or more frequently if you are over 4 years old. Skin check. Lung cancer screening. You may have this screening every year starting at age 56 if you have a 30-pack-year history of smoking and currently smoke or have quit within the past 15 years. Fecal occult blood test (FOBT) of the stool. You may have this test every year starting at age 65. Flexible sigmoidoscopy or colonoscopy. You may have a sigmoidoscopy every 5 years or a colonoscopy every 10 years starting at age 12. Prostate cancer screening. Recommendations will vary depending on your family history and other risks. Hepatitis C blood test. Hepatitis B blood test. Sexually transmitted disease (STD) testing. Diabetes screening. This is done by checking your blood sugar (glucose) after you have not eaten for a while (fasting). You may have this done every 1-3 years. Discuss your test results, treatment options, and if necessary, the need for more tests with your health care provider. Vaccines  Your health care provider may recommend certain vaccines, such as: Influenza vaccine. This is recommended every year. Tetanus, diphtheria, and acellular pertussis (Tdap, Td) vaccine. You may need a Td booster every 10 years. Zoster vaccine. You may need this after age 8. Pneumococcal 13-valent conjugate (PCV13) vaccine. You  may need this if you have certain conditions and have not been vaccinated. Pneumococcal  polysaccharide (PPSV23) vaccine. You may need one or two doses if you smoke cigarettes or if you have certain conditions. Talk to your health care provider about which screenings and vaccines you need and how often you need them. This information is not intended to replace advice given to you by your health care provider. Make sure you discuss any questions you have with your health care provider. Document Released: 03/23/2015 Document Revised: 11/14/2015 Document Reviewed: 12/26/2014 Elsevier Interactive Patient Education  2017 [place] Prevention in the Home Falls can cause injuries. They can happen to people of all ages. There are many things you can do to make your home safe and to help prevent falls. What can I do on the outside of my home? Regularly fix the edges of walkways and driveways and fix any cracks. Remove anything that might make you trip as you walk through a door, such as a raised step or threshold. Trim any bushes or trees on the path to your home. Use bright outdoor lighting. Clear any walking paths of anything that might make someone trip, such as rocks or tools. Regularly check to see if handrails are loose or broken. Make sure that both sides of any steps have handrails. Any raised decks and porches should have guardrails on the edges. Have any leaves, snow, or ice cleared regularly. Use sand or salt on walking paths during winter. Clean up any spills in your garage right away. This includes oil or grease spills. What can I do in the bathroom? Use night lights. Install grab bars by the toilet and in the tub and shower. Do not use towel bars as grab bars. Use non-skid mats or decals in the tub or shower. If you need to sit down in the shower, use a plastic, non-slip stool. Keep the floor dry. Clean up any water that spills on the floor as soon as it happens. Remove soap buildup in the tub or shower regularly. Attach bath mats securely with double-sided  non-slip rug tape. Do not have throw rugs and other things on the floor that can make you trip. What can I do in the bedroom? Use night lights. Make sure that you have a light by your bed that is easy to reach. Do not use any sheets or blankets that are too big for your bed. They should not hang down onto the floor. Have a firm chair that has side arms. You can use this for support while you get dressed. Do not have throw rugs and other things on the floor that can make you trip. What can I do in the kitchen? Clean up any spills right away. Avoid walking on wet floors. Keep items that you use a lot in easy-to-reach places. If you need to reach something above you, use a strong step stool that has a grab bar. Keep electrical cords out of the way. Do not use floor polish or wax that makes floors slippery. If you must use wax, use non-skid floor wax. Do not have throw rugs and other things on the floor that can make you trip. What can I do with my stairs? Do not leave any items on the stairs. Make sure that there are handrails on both sides of the stairs and use them. Fix handrails that are broken or loose. Make sure that handrails are as long as the stairways. Check any carpeting to make  sure that it is firmly attached to the stairs. Fix any carpet that is loose or worn. Avoid having throw rugs at the top or bottom of the stairs. If you do have throw rugs, attach them to the floor with carpet tape. Make sure that you have a light switch at the top of the stairs and the bottom of the stairs. If you do not have them, ask someone to add them for you. What else can I do to help prevent falls? Wear shoes that: Do not have high heels. Have rubber bottoms. Are comfortable and fit you well. Are closed at the toe. Do not wear sandals. If you use a stepladder: Make sure that it is fully opened. Do not climb a closed stepladder. Make sure that both sides of the stepladder are locked into place. Ask  someone to hold it for you, if possible. Clearly mark and make sure that you can see: Any grab bars or handrails. First and last steps. Where the edge of each step is. Use tools that help you move around (mobility aids) if they are needed. These include: Canes. Walkers. Scooters. Crutches. Turn on the lights when you go into a dark area. Replace any light bulbs as soon as they burn out. Set up your furniture so you have a clear path. Avoid moving your furniture around. If any of your floors are uneven, fix them. If there are any pets around you, be aware of where they are. Review your medicines with your doctor. Some medicines can make you feel dizzy. This can increase your chance of falling. Ask your doctor what other things that you can do to help prevent falls. This information is not intended to replace advice given to you by your health care provider. Make sure you discuss any questions you have with your health care provider. Document Released: 12/21/2008 Document Revised: 08/02/2015 Document Reviewed: 03/31/2014 Elsevier Interactive Patient Education  2017 [employer].

## 2021-09-19 NOTE — Progress Notes (Signed)
Subjective:   Bruce Guerrero is a 61 y.o. male who presents for Medicare Annual/Subsequent preventive examination.  Virtual Visit via Telephone Note  I connected with  Bruce Guerrero on 09/19/21 at  8:15 AM EDT by telephone and verified that I am speaking with the correct person using two identifiers.  Location: Patient: Home Provider: [facility] Persons participating in the virtual visit: patient/Nurse Health Advisor   I discussed the limitations, risks, security and privacy concerns of performing an evaluation and management service by telephone and the availability of in person appointments. The patient expressed understanding and agreed to proceed.  Interactive audio and video telecommunications were attempted between this nurse and patient, however failed, due to patient having technical difficulties OR patient did not have access to video capability.  We continued and completed visit with audio only.  Some vital signs may be absent or patient reported.   Patrizia Paule E Lache Dagher, LPN   Review of Systems     Cardiac Risk Factors include: advanced age (>3855men, 59>65 women);diabetes mellitus;dyslipidemia;hypertension;male gender;obesity (BMI >30kg/m2);Other (see comment);microalbuminuria, Risk factor comments: hx of stroke     Objective:    Today's Vitals   09/19/21 0818  Weight: 192 lb (87.1 kg)   Body mass index is 32.96 kg/m.     09/19/2021    8:23 AM 09/18/2020    8:31 AM 08/30/2019    2:27 PM 02/18/2016    7:29 AM 04/20/2015   11:27 PM 11/11/2013    4:01 PM 02/08/2013    5:08 PM  Advanced Directives  Does Patient Have a Medical Advance Directive? No No Yes;No No No No Patient does not have advance directive;Patient would not like information  Does patient want to make changes to medical advance directive?   No - Patient declined      Would patient like information on creating a medical advance directive? No - Patient declined No - Patient declined No - Patient declined  No -  patient declined information No - patient declined information   Pre-existing out of facility DNR order (yellow form or pink MOST form)       No    Current Medications (verified) Outpatient Encounter Medications as of 09/19/2021  Medication Sig   allopurinol (ZYLOPRIM) 300 MG tablet Take 1 tablet (300 mg total) by mouth daily.   amLODipine (NORVASC) 10 MG tablet Take 1 tablet (10 mg total) by mouth daily.   atorvastatin (LIPITOR) 80 MG tablet TAKE 1 TABLET BY MOUTH DAILY AT 6PM   baclofen (LIORESAL) 10 MG tablet Take 1 tablet (10 mg total) by mouth 3 (three) times daily.   carvedilol (COREG) 6.25 MG tablet Take 1 tablet (6.25 mg total) by mouth 2 (two) times daily with a meal.   clopidogrel (PLAVIX) 75 MG tablet Take 1 tablet (75 mg total) by mouth daily.   colchicine 0.6 MG tablet Take 1 tablet (0.6 mg total) by mouth daily.   sertraline (ZOLOFT) 50 MG tablet Take 1 tablet (50 mg total) by mouth daily.   terbinafine (LAMISIL) 250 MG tablet Take 1 tablet (250 mg total) by mouth daily.   [DISCONTINUED] acetaminophen (TYLENOL) 500 MG tablet Take 1 tablet (500 mg total) by mouth every 6 (six) hours as needed.   [DISCONTINUED] tobramycin-dexamethasone (TOBRADEX) ophthalmic solution Apply 1 drop in affected eye(s) every 2 hours for two days. Then every 4 hours for 5 days.   No facility-administered encounter medications on file as of 09/19/2021.    Allergies (verified) Patient has no  known allergies.   History: Past Medical History:  Diagnosis Date   Gout    Hyperlipidemia    Hypertension    Stroke (HCC) 2014   Past Surgical History:  Procedure Laterality Date   NO PAST SURGERIES     Family History  Problem Relation Age of Onset   COPD Mother    COPD Father    Cancer Sister    Colon cancer Sister        late 44's   Hyperlipidemia Brother    Diabetes Brother    Social History   Socioeconomic History   Marital status: Divorced    Spouse name: Not on file   Number of  children: 1   Years of education: Not on file   Highest education level: Not on file  Occupational History   Occupation: retired/ disabilty  Tobacco Use   Smoking status: Never   Smokeless tobacco: Never  Vaping Use   Vaping Use: Never used  Substance and Sexual Activity   Alcohol use: No   Drug use: No   Sexual activity: Not on file  Other Topics Concern   Not on file  Social History Narrative   One son lives at home with him. Other son lives in [place]   Social Determinants of Health   Financial Resource Strain: Low Risk  (09/19/2021)   Overall Financial Resource Strain (CARDIA)    Difficulty of Paying Living Expenses: Not hard at all  Food Insecurity: No Food Insecurity (09/19/2021)   Hunger Vital Sign    Worried About Running Out of Food in the Last Year: Never true    Ran Out of Food in the Last Year: Never true  Transportation Needs: No Transportation Needs (09/19/2021)   PRAPARE - Administrator, Civil Service (Medical): No    Lack of Transportation (Non-Medical): No  Physical Activity: Sufficiently Active (09/19/2021)   Exercise Vital Sign    Days of Exercise per Week: 7 days    Minutes of Exercise per Session: 30 min  Stress: No Stress Concern Present (09/19/2021)   [employer] of Occupational Health - Occupational Stress Questionnaire    Feeling of Stress : Not at all  Social Connections: Moderately Integrated (09/19/2021)   Social Connection and Isolation Panel [NHANES]    Frequency of Communication with Friends and Family: More than three times a week    Frequency of Social Gatherings with Friends and Family: More than three times a week    Attends Religious Services: More than 4 times per year    Active Member of [employer] or Organizations: Yes    Attends Engineer, structural: More than 4 times per year    Marital Status: Divorced    Tobacco Counseling Counseling given: Not Answered   Clinical Intake:  Pre-visit preparation  completed: Yes  Pain : No/denies pain     BMI - recorded: 32.96 Nutritional Status: BMI > 30  Obese Nutritional Risks: None Diabetes: Yes CBG done?: No Did pt. bring in CBG monitor from home?: No  How often do you need to have someone help you when you read instructions, pamphlets, or other written materials from your doctor or pharmacy?: 1 - Never  Diabetic? Nutrition Risk Assessment:  Has the patient had any N/V/D within the last 2 months?  No  Does the patient have any non-healing wounds?  No  Has the patient had any unintentional weight loss or weight gain?  No   Diabetes:  Is the patient  diabetic?  Yes  If diabetic, was a CBG obtained today?  No  Did the patient bring in their glucometer from home?  No  How often do you monitor your CBG's? never.   Financial Strains and Diabetes Management:  Are you having any financial strains with the device, your supplies or your medication? No .  Does the patient want to be seen by Chronic Care Management for management of their diabetes?  No  Would the patient like to be referred to a Nutritionist or for Diabetic Management?  No   Diabetic Exams:  Diabetic Eye Exam: Completed 07/2021. We need record  Diabetic Foot Exam:  Pt has been advised about the importance in completing this exam. Pt is scheduled for diabetic foot exam on 11/01/2021.    Interpreter Needed?: No  Information entered by :: Dartagnan Beavers, LPN   Activities of Daily Living    09/19/2021    8:23 AM  In your present state of health, do you have any difficulty performing the following activities:  Hearing? 0  Vision? 0  Difficulty concentrating or making decisions? 0  Walking or climbing stairs? 1  Dressing or bathing? 0  Doing errands, shopping? 0  Preparing Food and eating ? N  Using the Toilet? N  In the past six months, have you accidently leaked urine? N  Do you have problems with loss of bowel control? N  Managing your Medications? N  Managing your  Finances? N  Housekeeping or managing your Housekeeping? N    Patient Care Team: Dettinger, Elige Radon, MD as PCP - General (Family Medicine) Marcelino Duster, MD as Referring Physician (Dermatology) Michaelle Copas, MD as Referring Physician (Optometry)  Indicate any recent Medical Services you may have received from other than [facility] providers in the past year (date may be approximate).     Assessment:   This is a routine wellness examination for Bruce Guerrero.  Hearing/Vision screen Hearing Screening - Comments:: Denies hearing difficulties   Vision Screening - Comments:: Wears rx glasses - up to date with routine eye exams with [facility]  Dietary issues and exercise activities discussed: Current Exercise Habits: Home exercise routine, Type of exercise: walking;Other - see comments (stays busy working on things, yard work, house work, Catering manager), Time (Minutes): 30, Frequency (Times/Week): 7, Weekly Exercise (Minutes/Week): 210, Intensity: Mild, Exercise limited by: orthopedic condition(s);neurologic condition(s)   Goals Addressed               This Visit's Progress     COMPLETED: DIET - EAT MORE FRUITS AND VEGETABLES   On track     COMPLETED: Patient Stated (pt-stated)   On track     Would like to raise his hemoglobin. Currently 12.9. Instructed him to eat iron rich food. Green leafy vegetables, fortified cereals, poultry, fish, red meats, dried fruits      Patient Stated        09/19/21 - hopes to stay active and independent       Depression Screen    09/19/2021    8:22 AM 08/01/2021    8:28 AM 07/18/2021    9:39 AM 06/27/2021    8:35 AM 06/10/2021    2:43 PM 04/23/2021    9:22 AM 04/16/2021    9:17 AM  PHQ 2/9 Scores  PHQ - 2 Score 0 0 0 0 0 0 1  PHQ- 9 Score 0 0  0   1    Fall Risk    09/19/2021  8:20 AM 08/01/2021    8:28 AM 07/18/2021    9:39 AM 06/27/2021    8:35 AM 06/10/2021    2:43 PM  Fall Risk   Falls in the past year? 0 0 0 0 0  Number falls in past  yr: 0      Injury with Fall? 0      Risk for fall due to : Orthopedic patient;Impaired balance/gait      Follow up Falls prevention discussed;Education provided        FALL RISK PREVENTION PERTAINING TO THE HOME:  Any stairs in or around the home? Yes  If so, are there any without handrails? No  Home free of loose throw rugs in walkways, pet beds, electrical cords, etc? Yes  Adequate lighting in your home to reduce risk of falls? Yes   ASSISTIVE DEVICES UTILIZED TO PREVENT FALLS:  Life alert? No  Use of a cane, walker or w/c? Yes  Grab bars in the bathroom? No  Shower chair or bench in shower? No  Elevated toilet seat or a handicapped toilet? No   TIMED UP AND GO:  Was the test performed? No . Telephonic visit  Cognitive Function:        09/19/2021    8:25 AM 08/30/2019    2:29 PM  6CIT Screen  What Year? 0 points 0 points  What month? 0 points 0 points  What time? 0 points 0 points  Count back from 20 0 points 0 points  Months in reverse 0 points 0 points  Repeat phrase 0 points 0 points  Total Score 0 points 0 points    Immunizations Immunization History  Administered Date(s) Administered   Influenza,inj,Quad PF,6+ Mos 02/02/2013, 01/11/2014, 12/22/2014, 12/31/2015, 12/22/2016, 12/23/2017, 11/30/2018, 12/01/2019, 12/04/2020   Moderna Sars-Covid-2 Vaccination 05/26/2019, 06/24/2019, 01/12/2020, 10/04/2020, 12/27/2020   Pneumococcal Conjugate-13 10/16/2020   Tdap 10/10/2013   Zoster Recombinat (Shingrix) 11/01/2020, 03/12/2021    TDAP status: Up to date  Flu Vaccine status: Up to date  Pneumococcal vaccine status: Up to date  Covid-19 vaccine status: Completed vaccines  Qualifies for Shingles Vaccine? Yes   Zostavax completed Yes   Shingrix Completed?: Yes  Screening Tests Health Maintenance  Topic Date Due   FOOT EXAM  Never done   OPHTHALMOLOGY EXAM  Never done   COVID-19 Vaccine (6 - Moderna series) 02/21/2021   INFLUENZA VACCINE  10/08/2021    HEMOGLOBIN A1C  02/01/2022   URINE MICROALBUMIN  08/02/2022   TETANUS/TDAP  10/11/2023   COLONOSCOPY (Pts 45-87yrs Insurance coverage will need to be confirmed)  05/10/2026   Hepatitis C Screening  Completed   Zoster Vaccines- Shingrix  Completed   HPV VACCINES  Aged Out   HIV Screening  Discontinued    Health Maintenance  Health Maintenance Due  Topic Date Due   FOOT EXAM  Never done   OPHTHALMOLOGY EXAM  Never done   COVID-19 Vaccine (6 - Moderna series) 02/21/2021    Colorectal cancer screening: Type of screening: Colonoscopy. Completed 05/09/2021. Repeat every 5 years  Lung Cancer Screening: (Low Dose CT Chest recommended if Age 46-80 years, 30 pack-year currently smoking OR have quit w/in 15years.) does not qualify.   Additional Screening:  Hepatitis C Screening: does qualify; Completed 01/12/2015  Vision Screening: Recommended annual ophthalmology exams for early detection of glaucoma and other disorders of the eye. Is the patient up to date with their annual eye exam?  Yes  Who is the provider or what is the name  of the office in which the patient attends annual eye exams? [facility] If pt is not established with a provider, would they like to be referred to a provider to establish care? No .   Dental Screening: Recommended annual dental exams for proper oral hygiene  Community Resource Referral / Chronic Care Management: CRR required this visit?  No   CCM required this visit?  No      Plan:     I have personally reviewed and noted the following in the patient's chart:   Medical and social history Use of alcohol, tobacco or illicit drugs  Current medications and supplements including opioid prescriptions. Patient is not currently taking opioid prescriptions. Functional ability and status Nutritional status Physical activity Advanced directives List of other physicians Hospitalizations, surgeries, and ER visits in previous 12  months Vitals Screenings to include cognitive, depression, and falls Referrals and appointments  In addition, I have reviewed and discussed with patient certain preventive protocols, quality metrics, and best practice recommendations. A written personalized care plan for preventive services as well as general preventive health recommendations were provided to patient.     [place] Constable, LPN   [id number]/[id number]   Nurse Notes: None

## 2021-11-01 ENCOUNTER — Encounter: Payer: Self-pay | Admitting: Family Medicine

## 2021-11-01 ENCOUNTER — Ambulatory Visit (INDEPENDENT_AMBULATORY_CARE_PROVIDER_SITE_OTHER): Payer: Medicare Other | Admitting: Family Medicine

## 2021-11-01 VITALS — BP 122/63 | HR 54 | Temp 98.7°F | Ht 64.0 in | Wt 187.8 lb

## 2021-11-01 DIAGNOSIS — E782 Mixed hyperlipidemia: Secondary | ICD-10-CM | POA: Diagnosis not present

## 2021-11-01 DIAGNOSIS — E1169 Type 2 diabetes mellitus with other specified complication: Secondary | ICD-10-CM

## 2021-11-01 DIAGNOSIS — I10 Essential (primary) hypertension: Secondary | ICD-10-CM | POA: Diagnosis not present

## 2021-11-01 LAB — BMP8+EGFR
BUN/Creatinine Ratio: 17 (ref 10–24)
BUN: 21 mg/dL (ref 8–27)
CO2: 25 mmol/L (ref 20–29)
Calcium: 9.7 mg/dL (ref 8.6–10.2)
Chloride: 100 mmol/L (ref 96–106)
Creatinine, Ser: 1.24 mg/dL (ref 0.76–1.27)
Glucose: 110 mg/dL — ABNORMAL HIGH (ref 70–99)
Potassium: 4.2 mmol/L (ref 3.5–5.2)
Sodium: 139 mmol/L (ref 134–144)
eGFR: 67 mL/min/{1.73_m2} (ref >59)

## 2021-11-01 LAB — BAYER DCA HB A1C WAIVED: HB A1C (BAYER DCA - WAIVED): 6 % — ABNORMAL HIGH (ref 4.8–5.6)

## 2021-11-01 NOTE — Progress Notes (Signed)
BP 122/63   Pulse (!) 54   Temp 98.7 F (37.1 C)   Ht $R'5\' 4"'mm$  (1.626 m)   Wt 187 lb 12.8 oz (85.2 kg)   SpO2 98%   BMI 32.24 kg/m    Subjective:   Patient ID: Bruce Guerrero, male    DOB: 1960/05/11, 61 y.o.   MRN: [id number]  HPI: Bruce Guerrero is a 61 y.o. male presenting on 11/01/2021 for Medical Management of Chronic Issues (3 month)   HPI Type 2 diabetes mellitus Patient comes in today for recheck of his diabetes. Patient has been currently taking no medication currently, has been diet controlled. Patient is not currently on an ACE inhibitor/ARB. Patient has not seen an ophthalmologist this year. Patient denies any issues with their feet. The symptom started onset as an adult hypertension and hyperlipidemia ARE RELATED TO DM   Hypertension Patient is currently on carvedilol and amlodipine, and their blood pressure today is 122/63. Patient denies any lightheadedness or dizziness. Patient denies headaches, blurred vision, chest pains, shortness of breath, or weakness. Denies any side effects from medication and is content with current medication.   Hyperlipidemia Patient is coming in for recheck of his hyperlipidemia. The patient is currently taking atorvastatin. They deny any issues with myalgias or history of liver damage from it. They deny any focal numbness or weakness or chest pain.   Relevant past medical, surgical, family and social history reviewed and updated as indicated. Interim medical history since our last visit reviewed. Allergies and medications reviewed and updated.  Review of Systems  Constitutional:  Negative for chills and fever.  Eyes:  Negative for visual disturbance.  Respiratory:  Negative for shortness of breath and wheezing.   Cardiovascular:  Negative for chest pain and leg swelling.  Musculoskeletal:  Negative for back pain and gait problem.  Skin:  Negative for rash.  Neurological:  Negative for dizziness, weakness and light-headedness.   All other systems reviewed and are negative.   Per HPI unless specifically indicated above   Allergies as of 11/01/2021   No Known Allergies      Medication List        Accurate as of November 01, 2021  8:31 AM. If you have any questions, ask your nurse or doctor.          allopurinol 300 MG tablet Commonly known as: ZYLOPRIM Take 1 tablet (300 mg total) by mouth daily.   amLODipine 10 MG tablet Commonly known as: NORVASC Take 1 tablet (10 mg total) by mouth daily.   atorvastatin 80 MG tablet Commonly known as: LIPITOR TAKE 1 TABLET BY MOUTH DAILY AT 6PM   baclofen 10 MG tablet Commonly known as: LIORESAL Take 1 tablet (10 mg total) by mouth 3 (three) times daily.   carvedilol 6.25 MG tablet Commonly known as: COREG Take 1 tablet (6.25 mg total) by mouth 2 (two) times daily with a meal.   clopidogrel 75 MG tablet Commonly known as: PLAVIX Take 1 tablet (75 mg total) by mouth daily.   colchicine 0.6 MG tablet Take 1 tablet (0.6 mg total) by mouth daily.   sertraline 50 MG tablet Commonly known as: ZOLOFT Take 1 tablet (50 mg total) by mouth daily.   terbinafine 250 MG tablet Commonly known as: LAMISIL Take 1 tablet (250 mg total) by mouth daily.         Objective:   BP 122/63   Pulse (!) 54   Temp 98.7 F (37.1 C)  Ht '5\' 4"'$  (1.626 m)   Wt 187 lb 12.8 oz (85.2 kg)   SpO2 98%   BMI 32.24 kg/m   Wt Readings from Last 3 Encounters:  11/01/21 187 lb 12.8 oz (85.2 kg)  09/19/21 192 lb (87.1 kg)  08/01/21 192 lb (87.1 kg)    Physical Exam Vitals and nursing note reviewed.  Constitutional:      General: He is not in acute distress.    Appearance: He is well-developed. He is not diaphoretic.  Eyes:     General: No scleral icterus.    Conjunctiva/sclera: Conjunctivae normal.  Neck:     Thyroid: No thyromegaly.  Cardiovascular:     Rate and Rhythm: Normal rate and regular rhythm.     Heart sounds: Normal heart sounds. No murmur  heard. Pulmonary:     Effort: Pulmonary effort is normal. No respiratory distress.     Breath sounds: Normal breath sounds. No wheezing.  Musculoskeletal:        General: No swelling.     Cervical back: Neck supple.  Lymphadenopathy:     Cervical: No cervical adenopathy.  Skin:    General: Skin is warm and dry.     Findings: No rash.  Neurological:     Mental Status: He is alert and oriented to person, place, and time.     Coordination: Coordination normal.  Psychiatric:        Behavior: Behavior normal.       Assessment & Plan:   Problem List Items Addressed This Visit       Cardiovascular and Mediastinum   Essential hypertension   Relevant Orders   BMP8+EGFR   Bayer DCA Hb A1c Waived     Endocrine   Type 2 diabetes mellitus with other specified complication ([place])   Relevant Orders   BMP8+EGFR   Bayer DCA Hb A1c Waived     Other   Hyperlipidemia - Primary   Relevant Orders   BMP8+EGFR   Bayer DCA Hb A1c Waived    A1c is 6.0, looks good Blood pressure looks good, heart rate looks good, everything else looks good.  No changes Follow up plan: Return in about 3 months (around 02/01/2022), or if symptoms worsen or fail to improve, for Diabetes hypertension cholesterol.  Counseling provided for all of the vaccine components Orders Placed This Encounter  Procedures   BMP8+EGFR   Bayer DCA Hb A1c [place], MD [facility] 11/01/2021, 8:31 AM

## 2021-12-11 ENCOUNTER — Ambulatory Visit (INDEPENDENT_AMBULATORY_CARE_PROVIDER_SITE_OTHER): Payer: Medicare Other

## 2021-12-11 DIAGNOSIS — Z23 Encounter for immunization: Secondary | ICD-10-CM

## 2021-12-24 DIAGNOSIS — L570 Actinic keratosis: Secondary | ICD-10-CM | POA: Diagnosis not present

## 2022-02-08 ENCOUNTER — Other Ambulatory Visit: Payer: Self-pay | Admitting: Family Medicine

## 2022-02-08 DIAGNOSIS — Z8673 Personal history of transient ischemic attack (TIA), and cerebral infarction without residual deficits: Secondary | ICD-10-CM

## 2022-02-10 ENCOUNTER — Encounter: Payer: Self-pay | Admitting: Family Medicine

## 2022-02-10 ENCOUNTER — Ambulatory Visit (INDEPENDENT_AMBULATORY_CARE_PROVIDER_SITE_OTHER): Payer: Medicare Other | Admitting: Family Medicine

## 2022-02-10 VITALS — BP 129/64 | HR 62 | Temp 97.6°F | Ht 64.0 in | Wt 190.0 lb

## 2022-02-10 DIAGNOSIS — Z0001 Encounter for general adult medical examination with abnormal findings: Secondary | ICD-10-CM | POA: Diagnosis not present

## 2022-02-10 DIAGNOSIS — I10 Essential (primary) hypertension: Secondary | ICD-10-CM | POA: Diagnosis not present

## 2022-02-10 DIAGNOSIS — Z0000 Encounter for general adult medical examination without abnormal findings: Secondary | ICD-10-CM

## 2022-02-10 DIAGNOSIS — E782 Mixed hyperlipidemia: Secondary | ICD-10-CM | POA: Diagnosis not present

## 2022-02-10 DIAGNOSIS — M654 Radial styloid tenosynovitis [de Quervain]: Secondary | ICD-10-CM

## 2022-02-10 DIAGNOSIS — E1169 Type 2 diabetes mellitus with other specified complication: Secondary | ICD-10-CM | POA: Diagnosis not present

## 2022-02-10 DIAGNOSIS — K429 Umbilical hernia without obstruction or gangrene: Secondary | ICD-10-CM

## 2022-02-10 LAB — BAYER DCA HB A1C WAIVED: HB A1C (BAYER DCA - WAIVED): 5.7 % — ABNORMAL HIGH (ref 4.8–5.6)

## 2022-02-10 NOTE — Progress Notes (Signed)
BP 129/64   Pulse 62   Temp 97.6 F (36.4 C)   Ht _0  (1.626 m)   Wt 190 lb (86.2 kg)   SpO2 99%   BMI 32.61 kg/m    Subjective:   Patient ID: Bruce Guerrero, male    DOB: [birth year], 61 y.o.   MRN: [id number]  HPI: Bruce Guerrero is a 61 y.o. male presenting on 02/10/2022 for Medical Management of Chronic Issues, Hyperlipidemia, Hypertension, Prediabetes, Hernia (Abdominal- wants referral for surgery/enlarging), and Wrist Pain (Left, present for weeks)   HPI Physical exam Patient denies any chest pain, shortness of breath, headaches or vision issues, abdominal complaints, diarrhea, nausea, vomiting, or joint issues.  He has an umbilical hernia that is bothering him a little bit more because of size.  Has not been painful and he can still push it in but it is getting larger and he wants to go see a Psychologist, sport and exercise for it.  He also complains of left wrist pain on the lateral aspect of his wrist just proximal to his thumb.  He says is been bothering him for about a month and cannot recall anything making it better or worse.  He did try some Voltaren gel  Prediabetes Patient comes in today for recheck of his diabetes. Patient has been currently taking no medication, diet controlled.. Patient is not currently on an ACE inhibitor/ARB. Patient has not seen an ophthalmologist this year. Patient denies any issues with their feet. The symptom started onset as an adult hypertension and hyperlipidemia ARE RELATED TO DM   Hypertension Patient is currently on amlodipine and carvedilol, and their blood pressure today is 129/64. Patient denies any lightheadedness or dizziness. Patient denies headaches, blurred vision, chest pains, shortness of breath, or weakness. Denies any side effects from medication and is content with current medication.   Hyperlipidemia Patient is coming in for recheck of his hyperlipidemia. The patient is currently taking Lipitor. They deny any issues with myalgias or history  of liver damage from it. They deny any focal numbness or weakness or chest pain.   Relevant past medical, surgical, family and social history reviewed and updated as indicated. Interim medical history since our last visit reviewed. Allergies and medications reviewed and updated.  Review of Systems  Constitutional:  Negative for chills and fever.  HENT:  Negative for ear pain and tinnitus.   Eyes:  Negative for pain.  Respiratory:  Negative for cough, shortness of breath and wheezing.   Cardiovascular:  Negative for chest pain, palpitations and leg swelling.  Gastrointestinal:  Positive for abdominal distention. Negative for abdominal pain, blood in stool, constipation and diarrhea.  Genitourinary:  Negative for dysuria and hematuria.  Musculoskeletal:  Positive for arthralgias. Negative for back pain, gait problem and myalgias.  Skin:  Negative for rash.  Neurological:  Negative for dizziness, weakness and headaches.  Psychiatric/Behavioral:  Negative for suicidal ideas.   All other systems reviewed and are negative.   Per HPI unless specifically indicated above   Allergies as of 02/10/2022   No Known Allergies      Medication List        Accurate as of February 10, 2022  8:50 AM. If you have any questions, ask your nurse or doctor.          allopurinol 300 MG tablet Commonly known as: ZYLOPRIM Take 1 tablet (300 mg total) by mouth daily.   amLODipine 10 MG tablet Commonly known as: NORVASC Take 1 tablet (10 mg  total) by mouth daily.   atorvastatin 80 MG tablet Commonly known as: LIPITOR TAKE 1 TABLET BY MOUTH DAILY AT 6PM   baclofen 10 MG tablet Commonly known as: LIORESAL Take 1 tablet (10 mg total) by mouth 3 (three) times daily.   carvedilol 6.25 MG tablet Commonly known as: COREG Take 1 tablet (6.25 mg total) by mouth 2 (two) times daily with a meal.   clopidogrel 75 MG tablet Commonly known as: PLAVIX TAKE 1 TABLET BY MOUTH EVERY DAY   colchicine 0.6  MG tablet Take 1 tablet (0.6 mg total) by mouth daily.   sertraline 50 MG tablet Commonly known as: ZOLOFT Take 1 tablet (50 mg total) by mouth daily.   terbinafine 250 MG tablet Commonly known as: LAMISIL Take 1 tablet (250 mg total) by mouth daily.         Objective:   BP 129/64   Pulse 62   Temp 97.6 F (36.4 C)   Ht _0  (1.626 m)   Wt 190 lb (86.2 kg)   SpO2 99%   BMI 32.61 kg/m   Wt Readings from Last 3 Encounters:  02/10/22 190 lb (86.2 kg)  11/01/21 187 lb 12.8 oz (85.2 kg)  09/19/21 192 lb (87.1 kg)    Physical Exam Vitals reviewed.  Constitutional:      General: He is not in acute distress.    Appearance: He is well-developed. He is not diaphoretic.  Eyes:     General: No scleral icterus.    Conjunctiva/sclera: Conjunctivae normal.  Neck:     Thyroid: No thyromegaly.  Cardiovascular:     Rate and Rhythm: Normal rate and regular rhythm.     Heart sounds: Normal heart sounds. No murmur heard. Pulmonary:     Effort: Pulmonary effort is normal. No respiratory distress.     Breath sounds: Normal breath sounds. No wheezing.  Abdominal:     General: Bowel sounds are normal. There is no distension.     Palpations: Abdomen is soft.     Tenderness: There is no abdominal tenderness. There is no guarding or rebound.     Hernia: A hernia is present. Hernia is present in the umbilical area (Large but reducible).  Musculoskeletal:        General: Normal range of motion.     Cervical back: Neck supple.  Lymphadenopathy:     Cervical: No cervical adenopathy.  Skin:    General: Skin is warm and dry.     Findings: No rash.  Neurological:     Mental Status: He is alert and oriented to person, place, and time.     Coordination: Coordination normal.  Psychiatric:        Behavior: Behavior normal.       Assessment & Plan:   Problem List Items Addressed This Visit       Cardiovascular and Mediastinum   Essential hypertension   Relevant Orders   CBC  with Differential/Platelet   CMP14+EGFR   Lipid panel   Bayer DCA Hb A1c Waived     Endocrine   Type 2 diabetes mellitus with other specified complication ([place])   Relevant Orders   CBC with Differential/Platelet   CMP14+EGFR   Lipid panel   Bayer DCA Hb A1c Waived     Other   Hyperlipidemia   Relevant Orders   CBC with Differential/Platelet   CMP14+EGFR   Lipid panel   Bayer DCA Hb A1c Waived   Other Visit Diagnoses  Physical exam    -  Primary   De Quervain's disease (tenosynovitis)       Umbilical hernia without obstruction and without gangrene       Relevant Orders   Ambulatory referral to General Surgery       For his de Quervain's recommended ice and Voltaren gel and focus on behavior modification.  Educated him on what can movements or behaviors will exacerbate it.  A1c is good at 5.7 and blood pressure looks good, no changes.  Refer to general surgeon for umbilical hernia Follow up plan: Return in about 3 months (around 05/12/2022), or if symptoms worsen or fail to improve, for Diabetes and hypertension and cholesterol recheck.  Counseling provided for all of the vaccine components Orders Placed This Encounter  Procedures   CBC with Differential/Platelet   CMP14+EGFR   Lipid panel   Bayer Bolt Hb A1c Waived   Ambulatory referral to [place] Atom Solivan, MD [facility] 02/10/2022, 8:50 AM

## 2022-02-11 LAB — CMP14+EGFR
ALT: 25 IU/L (ref 0–44)
AST: 19 IU/L (ref 0–40)
Albumin/Globulin Ratio: 2.4 — ABNORMAL HIGH (ref 1.2–2.2)
Albumin: 4.4 g/dL (ref 3.8–4.9)
Alkaline Phosphatase: 94 IU/L (ref 44–121)
BUN/Creatinine Ratio: 12 (ref 10–24)
BUN: 16 mg/dL (ref 8–27)
Bilirubin Total: 0.3 mg/dL (ref 0.0–1.2)
CO2: 23 mmol/L (ref 20–29)
Calcium: 9.2 mg/dL (ref 8.6–10.2)
Chloride: 102 mmol/L (ref 96–106)
Creatinine, Ser: 1.34 mg/dL — ABNORMAL HIGH (ref 0.76–1.27)
Globulin, Total: 1.8 g/dL (ref 1.5–4.5)
Glucose: 138 mg/dL — ABNORMAL HIGH (ref 70–99)
Potassium: 4.2 mmol/L (ref 3.5–5.2)
Sodium: 141 mmol/L (ref 134–144)
Total Protein: 6.2 g/dL (ref 6.0–8.5)
eGFR: 61 mL/min/{1.73_m2} (ref >59)

## 2022-02-11 LAB — CBC WITH DIFFERENTIAL/PLATELET
Basophils Absolute: 0 10*3/uL (ref 0.0–0.2)
Basos: 0 %
EOS (ABSOLUTE): 0.1 10*3/uL (ref 0.0–0.4)
Eos: 2 %
Hematocrit: 41.4 % (ref 37.5–51.0)
Hemoglobin: 13.9 g/dL (ref 13.0–17.7)
Immature Grans (Abs): 0 10*3/uL (ref 0.0–0.1)
Immature Granulocytes: 0 %
Lymphocytes Absolute: 1.7 10*3/uL (ref 0.7–3.1)
Lymphs: 35 %
MCH: 31.7 pg (ref 26.6–33.0)
MCHC: 33.6 g/dL (ref 31.5–35.7)
MCV: 94 fL (ref 79–97)
Monocytes Absolute: 0.4 10*3/uL (ref 0.1–0.9)
Monocytes: 8 %
Neutrophils Absolute: 2.7 10*3/uL (ref 1.4–7.0)
Neutrophils: 55 %
Platelets: 151 10*3/uL (ref 150–450)
RBC: 4.39 x10E6/uL (ref 4.14–5.80)
RDW: 13.2 % (ref 11.6–15.4)
WBC: 5 10*3/uL (ref 3.4–10.8)

## 2022-02-11 LAB — LIPID PANEL
Chol/HDL Ratio: 2.5 ratio (ref 0.0–5.0)
Cholesterol, Total: 89 mg/dL — ABNORMAL LOW (ref 100–199)
HDL: 35 mg/dL — ABNORMAL LOW (ref >39)
LDL Chol Calc (NIH): 40 mg/dL (ref 0–99)
Triglycerides: 61 mg/dL (ref 0–149)
VLDL Cholesterol Cal: 14 mg/dL (ref 5–40)

## 2022-02-24 ENCOUNTER — Other Ambulatory Visit: Payer: Self-pay | Admitting: Family Medicine

## 2022-02-24 ENCOUNTER — Telehealth: Payer: Self-pay | Admitting: Family Medicine

## 2022-02-24 DIAGNOSIS — M654 Radial styloid tenosynovitis [de Quervain]: Secondary | ICD-10-CM

## 2022-02-24 DIAGNOSIS — B351 Tinea unguium: Secondary | ICD-10-CM

## 2022-02-24 MED ORDER — PREDNISONE 20 MG PO TABS: ORAL_TABLET | ORAL | 0 refills | Status: DC

## 2022-02-24 NOTE — Telephone Encounter (Signed)
Patient aware.

## 2022-02-24 NOTE — Telephone Encounter (Signed)
I sent him a short course of prednisone that he could try and see if that calms down the inflammation.  If not an injection may be his next step

## 2022-02-25 ENCOUNTER — Ambulatory Visit: Payer: Medicare Other | Admitting: General Surgery

## 2022-02-25 ENCOUNTER — Encounter: Payer: Self-pay | Admitting: General Surgery

## 2022-02-25 VITALS — BP 153/62 | HR 57 | Temp 98.5°F | Resp 14 | Ht 64.0 in | Wt 192.0 lb

## 2022-02-25 DIAGNOSIS — K429 Umbilical hernia without obstruction or gangrene: Secondary | ICD-10-CM | POA: Diagnosis not present

## 2022-02-25 NOTE — H&P (Signed)
[facility] History and Physical  Reason for Referral: Umbilical hernia   Referring Physician: Dr. Louanne Skye   Chief Complaint   New Patient (Initial Visit)     Bruce Guerrero is a 61 y.o. male.  HPI: The patient reports an umbilical hernia that has been present for years and getting larger. He has not noted any incarceration but does say that it has gotten hard and stuck out once with constipation but resolved. He had a stroke in the past and has some right sided spastic hemiplegia but is able to ambulate. He is on plavix. He has never had a heart attack and denies any chest pain or SOB. He is on a prednisone taper for tendonitis currently.   Past Medical History:  Diagnosis Date   Gout    Hyperlipidemia    Hypertension    Stroke [facility]) 2014    Past Surgical History:  Procedure Laterality Date   NO PAST SURGERIES      Family History  Problem Relation Age of Onset   COPD Mother    COPD Father    Cancer Sister    Colon cancer Sister        late 43's   Hyperlipidemia Brother    Diabetes Brother     Social History   Tobacco Use   Smoking status: Never   Smokeless tobacco: Never  Vaping Use   Vaping Use: Never used  Substance Use Topics   Alcohol use: No   Drug use: No    Medications: I have reviewed the patient's current medications. Allergies as of 02/25/2022   No Known Allergies      Medication List        Accurate as of February 25, 2022  9:11 AM. If you have any questions, ask your nurse or doctor.          allopurinol 300 MG tablet Commonly known as: ZYLOPRIM Take 1 tablet (300 mg total) by mouth daily.   amLODipine 10 MG tablet Commonly known as: NORVASC Take 1 tablet (10 mg total) by mouth daily.   atorvastatin 80 MG tablet Commonly known as: LIPITOR TAKE 1 TABLET BY MOUTH DAILY AT 6PM   baclofen 10 MG tablet Commonly known as: LIORESAL Take 1 tablet (10 mg total) by mouth 3 (three) times daily.   carvedilol  6.25 MG tablet Commonly known as: COREG Take 1 tablet (6.25 mg total) by mouth 2 (two) times daily with a meal.   clopidogrel 75 MG tablet Commonly known as: PLAVIX TAKE 1 TABLET BY MOUTH EVERY DAY   colchicine 0.6 MG tablet Take 1 tablet (0.6 mg total) by mouth daily.   predniSONE 20 MG tablet Commonly known as: DELTASONE 2 po at same time daily for 5 days   sertraline 50 MG tablet Commonly known as: ZOLOFT Take 1 tablet (50 mg total) by mouth daily.   terbinafine 250 MG tablet Commonly known as: LAMISIL TAKE 1 TABLET BY MOUTH EVERY DAY         ROS:  A comprehensive review of systems was negative except for: Gastrointestinal: positive for umbilical hernia Musculoskeletal: positive for right sided spastic hemiplegia RUE> RLE   Blood pressure (!) 153/62, pulse (!) 57, temperature 98.5 F (36.9 C), temperature source Oral, resp. rate 14, height 5\' 4"  (1.626 m), weight 192 lb (87.1 kg), SpO2 95 %. Physical Exam Vitals reviewed.  HENT:     Head: Normocephalic.     Nose: Nose normal.  Eyes:  Extraocular Movements: Extraocular movements intact.  Cardiovascular:     Rate and Rhythm: Normal rate and regular rhythm.  Pulmonary:     Effort: Pulmonary effort is normal.     Breath sounds: Normal breath sounds.  Abdominal:     General: There is no distension.     Palpations: Abdomen is soft.     Tenderness: There is no abdominal tenderness.     Hernia: A hernia is present.     Comments: Umbilical hernia, defect is between 2-4 cm, but difficult to feel complete edges, partially reducible, no pain, soft  Musculoskeletal:        General: No swelling.     Cervical back: Normal range of motion.  Skin:    General: Skin is warm.  Neurological:     General: No focal deficit present.     Mental Status: He is alert and oriented to person, place, and time.  Psychiatric:        Mood and Affect: Mood normal.        Behavior: Behavior normal.        Thought Content: Thought  content normal.     Results: None    Assessment & Plan:  Bruce Guerrero is a 61 y.o. male with an umbilical hernia that is getting bigger and he wants it fixed. Discussed the options of open hernia repair or laparoscopic robotic assisted repair. His defect is likely between 2-4 cm based on the exam but could be larger. This is borderline for not being able to do a good open repair. Discussed option of the laparoscopic robotic assisted to be able to close the defect and get better mesh coverage/ overlap. Discussed risk of bleeding, infection, recurrence, reasons for recurrence like smoking, diabetes, and weight gain, and injury to bowel.   Will plan for a laparoscopic robotic assisted repair with mesh.  Will be able to get back to activity quicker with the laparoscopic repair.   All questions were answered to the satisfaction of the patient.   Lucretia Roers 02/25/2022, 9:11 AM

## 2022-02-25 NOTE — Progress Notes (Signed)
[facility] History and Physical  Reason for Referral: Umbilical hernia   Referring Physician: Dr. Louanne Skye   Chief Complaint   New Patient (Initial Visit)     Bruce Guerrero is a 61 y.o. male.  HPI: The patient reports an umbilical hernia that has been present for years and getting larger. He has not noted any incarceration but does say that it has gotten hard and stuck out once with constipation but resolved. He had a stroke in the past and has some right sided spastic hemiplegia but is able to ambulate. He is on plavix. He has never had a heart attack and denies any chest pain or SOB. He is on a prednisone taper for tendonitis currently.   Past Medical History:  Diagnosis Date   Gout    Hyperlipidemia    Hypertension    Stroke [facility] 2014    Past Surgical History:  Procedure Laterality Date   NO PAST SURGERIES      Family History  Problem Relation Age of Onset   COPD Mother    COPD Father    Cancer Sister    Colon cancer Sister        late 43's   Hyperlipidemia Brother    Diabetes Brother     Social History   Tobacco Use   Smoking status: Never   Smokeless tobacco: Never  Vaping Use   Vaping Use: Never used  Substance Use Topics   Alcohol use: No   Drug use: No    Medications: I have reviewed the patient's current medications. Allergies as of 02/25/2022   No Known Allergies      Medication List        Accurate as of February 25, 2022  9:11 AM. If you have any questions, ask your nurse or doctor.          allopurinol 300 MG tablet Commonly known as: ZYLOPRIM Take 1 tablet (300 mg total) by mouth daily.   amLODipine 10 MG tablet Commonly known as: NORVASC Take 1 tablet (10 mg total) by mouth daily.   atorvastatin 80 MG tablet Commonly known as: LIPITOR TAKE 1 TABLET BY MOUTH DAILY AT 6PM   baclofen 10 MG tablet Commonly known as: LIORESAL Take 1 tablet (10 mg total) by mouth 3 (three) times daily.   carvedilol  6.25 MG tablet Commonly known as: COREG Take 1 tablet (6.25 mg total) by mouth 2 (two) times daily with a meal.   clopidogrel 75 MG tablet Commonly known as: PLAVIX TAKE 1 TABLET BY MOUTH EVERY DAY   colchicine 0.6 MG tablet Take 1 tablet (0.6 mg total) by mouth daily.   predniSONE 20 MG tablet Commonly known as: DELTASONE 2 po at same time daily for 5 days   sertraline 50 MG tablet Commonly known as: ZOLOFT Take 1 tablet (50 mg total) by mouth daily.   terbinafine 250 MG tablet Commonly known as: LAMISIL TAKE 1 TABLET BY MOUTH EVERY DAY         ROS:  A comprehensive review of systems was negative except for: Gastrointestinal: positive for umbilical hernia Musculoskeletal: positive for right sided spastic hemiplegia RUE> RLE   Blood pressure (!) 153/62, pulse (!) 57, temperature 98.5 F (36.9 C), temperature source Oral, resp. rate 14, height 5\' 4"  (1.626 m), weight 192 lb (87.1 kg), SpO2 95 %. Physical Exam Vitals reviewed.  HENT:     Head: Normocephalic.     Nose: Nose normal.  Eyes:  Extraocular Movements: Extraocular movements intact.  Cardiovascular:     Rate and Rhythm: Normal rate and regular rhythm.  Pulmonary:     Effort: Pulmonary effort is normal.     Breath sounds: Normal breath sounds.  Abdominal:     General: There is no distension.     Palpations: Abdomen is soft.     Tenderness: There is no abdominal tenderness.     Hernia: A hernia is present.     Comments: Umbilical hernia, defect is between 2-4 cm, but difficult to feel complete edges, partially reducible, no pain, soft  Musculoskeletal:        General: No swelling.     Cervical back: Normal range of motion.  Skin:    General: Skin is warm.  Neurological:     General: No focal deficit present.     Mental Status: He is alert and oriented to person, place, and time.  Psychiatric:        Mood and Affect: Mood normal.        Behavior: Behavior normal.        Thought Content: Thought  content normal.     Results: None    Assessment & Plan:  [name] is a 61 y.o. male with an umbilical hernia that is getting bigger and he wants it fixed. Discussed the options of open hernia repair or laparoscopic robotic assisted repair. His defect is likely between 2-4 cm based on the exam but could be larger. This is borderline for not being able to do a good open repair. Discussed option of the laparoscopic robotic assisted to be able to close the defect and get better mesh coverage/ overlap. Discussed risk of bleeding, infection, recurrence, reasons for recurrence like smoking, diabetes, and weight gain, and injury to bowel.   Will plan for a laparoscopic robotic assisted repair with mesh.  Will be able to get back to activity quicker with the laparoscopic repair.   All questions were answered to the satisfaction of the patient.   Lucretia Roers 02/25/2022, 9:11 AM

## 2022-02-25 NOTE — Patient Instructions (Signed)
Laparoscopic Robotic Assisted Ventral Hernia Repair Laparoscopic ventral hernia repairis a procedure to fix a bulge of tissue that pushes through a weak area of muscle in the abdomen (ventral hernia). A ventral hernia may be: Above the belly button. This is called an epigastric hernia. At the belly button. This is called an umbilical hernia. At the incision site from previous abdominal surgery. This is called an incisional hernia. You may have this procedure as emergency surgery if part of your intestine gets trapped inside the hernia and starts to lose its blood supply (strangulation). Laparoscopic surgery is done through small incisions using a thin surgical telescope with a light and camera (laparoscope). During surgery, your surgeon will use images from the laparoscope to guide the procedure. A mesh screen will be placed in the hernia to close the opening and strengthen the abdominal wall. Tell a health care provider about: Any allergies you have. All medicines you are taking, including vitamins, herbs, eye drops, creams, and over-the-counter medicines. Any problems you or family members have had with anesthetic medicines. Any blood disorders you have. Any surgeries you have had. Any medical conditions you have. Whether you are pregnant or may be pregnant. What are the risks? Generally, this is a safe procedure. However, problems may occur, including: Infection. Bleeding. Damage to nearby structures or organs in the abdomen. Trouble urinating or having a bowel movement after surgery. Blood clots. The hernia coming back after surgery. Fluid buildup in the area of the hernia. In some cases, your health care provider may need to switch from a laparoscopic procedure to a procedure that is done through a single, larger incision in the abdomen (open procedure). You may need an open procedure if: You have a hernia that is difficult to repair. Your organs are hard to see with the  laparoscope. You have bleeding problems during the laparoscopic procedure. What happens before the procedure? Medicines Ask your health care provider about: Changing or stopping your regular medicines. This is especially important if you are taking diabetes medicines or blood thinners. Taking medicines such as aspirin and ibuprofen. These medicines can thin your blood. Do not take these medicines unless your health care provider tells you to take them. Taking over-the-counter medicines, vitamins, herbs, and supplements. Tests You may need to have tests before the procedure, such as: Blood tests. Urine tests. Abdominal ultrasound. Chest X-ray. Electrocardiogram (ECG). General instructions You may be asked to take a laxative or do an enema to empty your bowel before surgery (bowel prep). Do not use any products that contain nicotine or tobacco for at least 4 weeks before the procedure. These products include cigarettes, chewing tobacco, and vaping devices, such as e-cigarettes. If you need help quitting, ask your health care provider. Ask your health care provider: How your surgery site will be marked. What steps will be taken to help prevent infection. These steps may include: Removing hair at the surgery site. Washing skin with a germ-killing soap. Receiving antibiotic medicine. Plan to have a responsible adult take you home from the hospital or clinic. If you will be going home right after the procedure, plan to have a responsible adult care for you for the time you are told. This is important. What happens during the procedure?  An IV will be inserted into one of your veins. You will be given one or more of the following: A medicine to help you relax (sedative). A medicine to numb the area (local anesthetic). A medicine to make you fall asleep (  general anesthetic). A small incision will be made in your abdomen. A hollow metal tube (trocar) will be placed through the incision. A  tube will be placed through the trocar to inflate your abdomen with carbon dioxide. This makes it easier for your surgeon to see inside your abdomen during the repair. A laparoscope will be inserted into your abdomen through the trocar. The laparoscope will send images to a monitor in the operating room. Other trocars will be put through other small incisions in your abdomen. The surgical instruments needed for the procedure will be placed through these trocars. The tissue or intestines that make up the hernia will be moved back into place. The edges of the hernia may be stitched (sutured) together. A piece of mesh will be used to close the hernia. Sutures, clips, or staples will be used to keep the mesh in place. A bandage (dressing) or skin glue will be put over the incisions. The procedure may vary among health care providers and hospitals. What happens after the procedure? Your blood pressure, heart rate, breathing rate, and blood oxygen level will be monitored until you leave the hospital or clinic. You will continue to receive fluids and medicines through an IV. Your IV will be removed when you can drink clear fluids. You will be given pain medicine as needed. You will be encouraged to get up and walk around as soon as possible. You will be shown how to do deep breathing exercises to help prevent a lung infection. If you were given a sedative during the procedure, it can affect you for several hours. Do not drive or operate machinery until your health care provider says that it is safe. Summary Laparoscopic ventral hernia is an operation to fix a hernia using small incisions. Tell your health care provider about other medical conditions that you have and about all the medicines that you are taking. Follow instructions from your health care provider about eating and drinking before the procedure. Plan to have a responsible adult take you home from the hospital or clinic. After the procedure,  you will be encouraged to walk as soon as possible. You will also be taught how to do deep breathing exercises. This information is not intended to replace advice given to you by your health care provider. Make sure you discuss any questions you have with your health care provider. Document Revised: 10/14/2019 Document Reviewed: 10/14/2019 Elsevier Patient Education  2023 [employer].

## 2022-02-28 ENCOUNTER — Other Ambulatory Visit: Payer: Self-pay | Admitting: Family Medicine

## 2022-02-28 DIAGNOSIS — M1A0420 Idiopathic chronic gout, left hand, without tophus (tophi): Secondary | ICD-10-CM

## 2022-02-28 NOTE — Telephone Encounter (Signed)
Yes he takes it daily for prevention

## 2022-02-28 NOTE — Telephone Encounter (Signed)
Should pt be taking Colchicine daily?

## 2022-03-07 ENCOUNTER — Ambulatory Visit (INDEPENDENT_AMBULATORY_CARE_PROVIDER_SITE_OTHER): Payer: Medicare Other | Admitting: Family Medicine

## 2022-03-07 ENCOUNTER — Encounter: Payer: Self-pay | Admitting: Family Medicine

## 2022-03-07 VITALS — BP 135/68 | HR 93 | Temp 97.7°F | Ht 64.0 in | Wt 189.6 lb

## 2022-03-07 DIAGNOSIS — M654 Radial styloid tenosynovitis [de Quervain]: Secondary | ICD-10-CM | POA: Diagnosis not present

## 2022-03-07 MED ORDER — METHYLPREDNISOLONE ACETATE 40 MG/ML IJ SUSP
40.0000 mg | Freq: Once | INTRAMUSCULAR | Status: AC
  Administered 2022-03-07: 30 mg via INTRAMUSCULAR

## 2022-03-07 NOTE — Progress Notes (Addendum)
Subjective:  Patient ID: Bruce Guerrero, male    DOB: [birth year], 61 y.o.   MRN: [id number]  Patient Care Team: Dettinger, Fransisca Kaufmann, MD as PCP - General (Family Medicine) Sandford Craze, MD as Referring Physician (Dermatology) Harlen Labs, MD as Referring Physician (Optometry)   Chief Complaint:  Wrist Pain (Left x 2 months )   HPI: Bruce Guerrero is a 61 y.o. male presenting on 03/07/2022 for Wrist Pain (Left x 2 months )   Wrist Pain  The pain is present in the left wrist and left hand. This is a recurrent problem. Episode onset: over two months ago, was treated with oral steroids without relief. There has been no history of extremity trauma. The problem occurs constantly. The problem has been waxing and waning. The quality of the pain is described as aching, burning and dull. The pain is moderate. Associated symptoms include joint swelling, a limited range of motion and stiffness. Pertinent negatives include no fever, inability to bear weight, itching, joint locking, numbness or tingling. The symptoms are aggravated by activity. He has tried NSAIDS for the symptoms. The treatment provided no relief.     Relevant past medical, surgical, family, and social history reviewed and updated as indicated.  Allergies and medications reviewed and updated. Data reviewed: Chart in Epic.   Past Medical History:  Diagnosis Date   Gout    Hyperlipidemia    Hypertension    Stroke ([place]) 2014    Past Surgical History:  Procedure Laterality Date   NO PAST SURGERIES      Social History   Socioeconomic History   Marital status: Divorced    Spouse name: Not on file   Number of children: 1   Years of education: Not on file   Highest education level: Not on file  Occupational History   Occupation: retired/ disabilty  Tobacco Use   Smoking status: Never   Smokeless tobacco: Never  Vaping Use   Vaping Use: Never used  Substance and Sexual Activity   Alcohol use: No    Drug use: No   Sexual activity: Not on file  Other Topics Concern   Not on file  Social History Narrative   One son lives at home with him. Other son lives in [place] Determinants of Health   Financial Resource Strain: Low Risk  (09/19/2021)   Overall Financial Resource Strain (CARDIA)    Difficulty of Paying Living Expenses: Not hard at all  Food Insecurity: No Food Insecurity (09/19/2021)   Hunger Vital Sign    Worried About Running Out of Food in the Last Year: Never true    Ran Out of Food in the Last Year: Never true  Transportation Needs: No Transportation Needs (09/19/2021)   PRAPARE - Hydrologist (Medical): No    Lack of Transportation (Non-Medical): No  Physical Activity: Sufficiently Active (09/19/2021)   Exercise Vital Sign    Days of Exercise per Week: 7 days    Minutes of Exercise per Session: 30 min  Stress: No Stress Concern Present (09/19/2021)   [place]    Feeling of Stress : Not at all  Social Connections: Moderately Integrated (09/19/2021)   Social Connection and Isolation Panel [NHANES]    Frequency of Communication with Friends and Family: More than three times a week    Frequency of Social Gatherings with Friends and Family: More than three  times a week    Attends Religious Services: More than 4 times per year    Active Member of Clubs or Organizations: Yes    Attends Archivist Meetings: More than 4 times per year    Marital Status: Divorced  Intimate Partner Violence: Not At Risk (09/19/2021)   Humiliation, Afraid, Rape, and Kick questionnaire    Fear of Current or Ex-Partner: No    Emotionally Abused: No    Physically Abused: No    Sexually Abused: No    Outpatient Encounter Medications as of 03/07/2022  Medication Sig   allopurinol (ZYLOPRIM) 300 MG tablet Take 1 tablet (300 mg total) by mouth daily.   amLODipine (NORVASC) 10 MG  tablet Take 1 tablet (10 mg total) by mouth daily.   atorvastatin (LIPITOR) 80 MG tablet TAKE 1 TABLET BY MOUTH DAILY AT 6PM   baclofen (LIORESAL) 10 MG tablet Take 1 tablet (10 mg total) by mouth 3 (three) times daily. (Patient taking differently: Take 10 mg by mouth 3 (three) times daily as needed for muscle spasms.)   carvedilol (COREG) 6.25 MG tablet Take 1 tablet (6.25 mg total) by mouth 2 (two) times daily with a meal.   Cinnamon 500 MG capsule Take 1,000 mg by mouth daily.   clopidogrel (PLAVIX) 75 MG tablet TAKE 1 TABLET BY MOUTH EVERY DAY   colchicine 0.6 MG tablet TAKE 1 TABLET BY MOUTH EVERY DAY   ferrous sulfate 325 (65 FE) MG tablet Take 325 mg by mouth daily with breakfast.   sertraline (ZOLOFT) 50 MG tablet Take 1 tablet (50 mg total) by mouth daily.   terbinafine (LAMISIL) 250 MG tablet TAKE 1 TABLET BY MOUTH EVERY DAY   Facility-Administered Encounter Medications as of 03/07/2022  Medication   methylPREDNISolone acetate (DEPO-MEDROL) injection 40 mg    No Known Allergies  Review of Systems  Constitutional:  Negative for activity change, appetite change, chills, diaphoresis, fatigue, fever and unexpected weight change.  Eyes:  Negative for photophobia and visual disturbance.  Respiratory:  Negative for cough and shortness of breath.   Cardiovascular:  Negative for chest pain, palpitations and leg swelling.  Gastrointestinal:  Negative for abdominal pain.  Genitourinary:  Negative for decreased urine volume and difficulty urinating.  Musculoskeletal:  Positive for arthralgias, joint swelling, myalgias and stiffness.  Skin:  Negative for itching.  Neurological:  Negative for tingling, weakness and numbness.  Psychiatric/Behavioral:  Negative for confusion.   All other systems reviewed and are negative.       Objective:  BP 135/68   Pulse 93   Temp 97.7 F (36.5 C) (Temporal)   Ht _0  (1.626 m)   Wt 189 lb 9.6 oz (86 kg)   SpO2 98%   BMI 32.54 kg/m    Wt  Readings from Last 3 Encounters:  03/07/22 189 lb 9.6 oz (86 kg)  02/25/22 192 lb (87.1 kg)  02/10/22 190 lb (86.2 kg)    Physical Exam Vitals and nursing note reviewed.  Constitutional:      General: He is not in acute distress.    Appearance: Normal appearance. He is obese. He is not ill-appearing, toxic-appearing or diaphoretic.  HENT:     Head: Normocephalic and atraumatic.     Mouth/Throat:     Mouth: Mucous membranes are moist.  Eyes:     Pupils: Pupils are equal, round, and reactive to light.  Cardiovascular:     Rate and Rhythm: Normal rate and regular rhythm.  Pulses: Normal pulses.     Heart sounds: Normal heart sounds.  Pulmonary:     Effort: Pulmonary effort is normal.     Breath sounds: Normal breath sounds.  Musculoskeletal:     Left forearm: Normal.     Left wrist: Swelling and tenderness (at first dorsal compartment over radial styloid, positive Finkelstein Test) present. No deformity, effusion, lacerations, bony tenderness, snuff box tenderness or crepitus. Normal range of motion. Normal pulse.     Left hand: Tenderness (proximal thumb) present.  Skin:    General: Skin is warm and dry.     Capillary Refill: Capillary refill takes less than 2 seconds.  Neurological:     Mental Status: He is alert.     Gait: Gait abnormal.  Psychiatric:        Mood and Affect: Mood normal.        Behavior: Behavior normal.        Thought Content: Thought content normal.        Judgment: Judgment normal.     Results for orders placed or performed in visit on 02/10/22  CBC with Differential/Platelet  Result Value Ref Range   WBC 5.0 3.4 - 10.8 x10E3/uL   RBC 4.39 4.14 - 5.80 x10E6/uL   Hemoglobin 13.9 13.0 - 17.7 g/dL   Hematocrit 41.4 37.5 - 51.0 %   MCV 94 79 - 97 fL   MCH 31.7 26.6 - 33.0 pg   MCHC 33.6 31.5 - 35.7 g/dL   RDW 13.2 11.6 - 15.4 %   Platelets 151 150 - 450 x10E3/uL   Neutrophils 55 Not Estab. %   Lymphs 35 Not Estab. %   Monocytes 8 Not Estab. %    Eos 2 Not Estab. %   Basos 0 Not Estab. %   Neutrophils Absolute 2.7 1.4 - 7.0 x10E3/uL   Lymphocytes Absolute 1.7 0.7 - 3.1 x10E3/uL   Monocytes Absolute 0.4 0.1 - 0.9 x10E3/uL   EOS (ABSOLUTE) 0.1 0.0 - 0.4 x10E3/uL   Basophils Absolute 0.0 0.0 - 0.2 x10E3/uL   Immature Granulocytes 0 Not Estab. %   Immature Grans (Abs) 0.0 0.0 - 0.1 x10E3/uL  CMP14+EGFR  Result Value Ref Range   Glucose 138 (H) 70 - 99 mg/dL   BUN 16 8 - 27 mg/dL   Creatinine, Ser 1.34 (H) 0.76 - 1.27 mg/dL   eGFR 61 >59 mL/min/1.73   BUN/Creatinine Ratio 12 10 - 24   Sodium 141 134 - 144 mmol/L   Potassium 4.2 3.5 - 5.2 mmol/L   Chloride 102 96 - 106 mmol/L   CO2 23 20 - 29 mmol/L   Calcium 9.2 8.6 - 10.2 mg/dL   Total Protein 6.2 6.0 - 8.5 g/dL   Albumin 4.4 3.8 - 4.9 g/dL   Globulin, Total 1.8 1.5 - 4.5 g/dL   Albumin/Globulin Ratio 2.4 (H) 1.2 - 2.2   Bilirubin Total 0.3 0.0 - 1.2 mg/dL   Alkaline Phosphatase 94 44 - 121 IU/L   AST 19 0 - 40 IU/L   ALT 25 0 - 44 IU/L  Lipid panel  Result Value Ref Range   Cholesterol, Total 89 (L) 100 - 199 mg/dL   Triglycerides 61 0 - 149 mg/dL   HDL 35 (L) >39 mg/dL   VLDL Cholesterol Cal 14 5 - 40 mg/dL   LDL Chol Calc (NIH) 40 0 - 99 mg/dL   Chol/HDL Ratio 2.5 0.0 - 5.0 ratio  Bayer DCA Hb A1c Waived  Result Value Ref  Range   HB A1C (BAYER DCA - WAIVED) 5.7 (H) 4.8 - 5.6 %     Joint Injection/Arthrocentesis  Date/Time: 03/07/2022 1:31 PM  Performed by: Baruch Gouty, FNP Authorized by: Baruch Gouty, FNP  Indications: pain  Body area: wrist Joint: left wrist Local anesthesia used: yes  Anesthesia: Local anesthesia used: yes Local Anesthetic: co-phenylcaine spray  Sedation: Patient sedated: no  Preparation: Patient was prepped and draped in the usual sterile fashion. Needle gauge: 25G. Approach: medial Aspirate amount: 0 mL Methylprednisolone amount: 30 mg Lidocaine 2% amount: 0.25 mL Patient tolerance: patient tolerated the procedure  well with no immediate complications      Pertinent labs & imaging results that were available during my care of the patient were reviewed by me and considered in my medical decision making.  Assessment & Plan:  Benjerman was seen today for wrist pain.  Diagnoses and all orders for this visit:  De Quervain's disease (tenosynovitis) Tried and failed oral steroids. Wanted trigger point injection today. Consent obtained and procedure performed. Pt aware to report new, worsening, or persistent symptoms. If persistent, will need referral to ortho.  -     Joint Injection/Arthrocentesis     Continue all other maintenance medications.  Follow up plan: Return in about 4 weeks (around 04/04/2022), or if symptoms worsen or fail to improve, for wrist pain.   Continue healthy lifestyle choices, including diet (rich in fruits, vegetables, and lean proteins, and low in salt and simple carbohydrates) and exercise (at least 30 minutes of moderate physical activity daily).  Educational handout given for [employer] disease (tenosynovitis)  The above assessment and management plan was discussed with the patient. The patient verbalized understanding of and has agreed to the management plan. Patient is aware to call the clinic if they develop any new symptoms or if symptoms persist or worsen. Patient is aware when to return to the clinic for a follow-up visit. Patient educated on when it is appropriate to go to the emergency department.   Monia Pouch, FNP-C [facility] [phone]

## 2022-03-07 NOTE — Addendum Note (Signed)
Addended by: Lorelee Cover C on: 03/07/2022 03:15 PM   Modules accepted: Orders

## 2022-03-11 NOTE — Pre-Procedure Instructions (Signed)
Spoke with Dr Constance Haw about plavix. Did not see a note to hold in history. She states that patient should be aware but of course to double check with him.

## 2022-03-11 NOTE — Patient Instructions (Signed)
AESON SAWYERS  03/11/2022     @PREFPERIOPPHARMACY @   Your procedure is scheduled on  03/14/2022.   Report to [facility] at  0800  A.M.   Call this number if you have problems the morning of surgery:  [phone]  If you experience any cold or flu symptoms such as cough, fever, chills, shortness of breath, etc. between now and your scheduled surgery, please notify us at the above number.   Remember:  Do not eat or drink after midnight.        Your last dose of plavix should have been on 03/09/2022.     Take these medicines the morning of surgery with A SIP OF WATER     allopurinol, amlodipine, baclofen (if needed), carvedilol, zoloft.     Do not wear jewelry, make-up or nail polish.  Do not wear lotions, powders, or perfumes, or deodorant.  Do not shave 48 hours prior to surgery.  Men may shave face and neck.  Do not bring valuables to the hospital.  [facility] is not responsible for any belongings or valuables.  Contacts, dentures or bridgework may not be worn into surgery.  Leave your suitcase in the car.  After surgery it may be brought to your room.  For patients admitted to the hospital, discharge time will be determined by your treatment team.  Patients discharged the day of surgery will not be allowed to drive home and must have someone with them for 24 hours.    Special instructions:   DO NOT smoke tobacco or vape for 24 hours before your procedure.  Please read over the following fact sheets that you were given. Pain Booklet, Coughing and Deep Breathing, Blood Transfusion Information, Surgical Site Infection Prevention, Anesthesia Post-op Instructions, and Care and Recovery After Surgery      Laparoscopic Ventral Hernia Repair, Care After The following information offers guidance on how to care for yourself after your procedure. Your health care provider may also give you more specific instructions. If you have problems or questions,  contact your health care provider. What can I expect after the procedure? After the procedure, it is common to have pain, discomfort, or soreness. Follow these instructions at home: Medicines Take over-the-counter and prescription medicines only as told by your health care provider. Ask your health care provider if the medicine prescribed to you: Requires you to avoid driving or using machinery. Can cause constipation. You may need to take these actions to prevent or treat constipation: Drink enough fluid to keep your urine pale yellow. Take over-the-counter or prescription medicines. Eat foods that are high in fiber, such as beans, whole grains, and fresh fruits and vegetables. Limit foods that are high in fat and processed sugars, such as fried or sweet foods. Incision care  Follow instructions from your health care provider about how to take care of your incisions. Make sure you: Wash your hands with soap and water for at least 20 seconds before and after you change your bandage (dressing) or before you touch your abdomen. If soap and water are not available, use hand sanitizer. Change your dressing as told by your health care provider. Leave stitches (sutures), skin glue, or adhesive strips in place. These skin closures may need to stay in place for 2 weeks or longer. If adhesive strip edges start to loosen and curl up, you may trim the loose edges. Do not remove adhesive strips completely unless your health  care provider tells you to do that. Check your incision areas every day for signs of infection. Check for: More redness, swelling, or pain. Fluid or blood. Warmth. Pus or a bad smell. Bathing  Do not take baths, swim, or use a hot tub until your health care provider approves. Ask your health care provider if you may take showers. You may only be allowed to take sponge baths. Keep your dressing dry until your health care provider says it can be removed. Activity  Rest as told by  your health care provider. Avoid sitting for a long time without moving. Get up to take short walks every 1-2 hours. This is important to improve blood flow and breathing. Ask for help if you feel weak or unsteady. Do not lift anything that is heavier than 10 lb (4.5 kg), or the limit that you are told, until your health care provider says that it is safe. If you were given a sedative during the procedure, it can affect you for several hours. Do not drive or operate machinery until your health care provider says that it is safe. Return to your normal activities as told by your health care provider. Ask your health care provider what activities are safe for you. General instructions  Hold a pillow over your abdomen when you cough or sneeze. This helps with pain. Do not use any products that contain nicotine or tobacco. These products include cigarettes, chewing tobacco, and vaping devices, such as e-cigarettes. These can delay healing after surgery. If you need help quitting, ask your health care provider. You may be asked to continue to do deep breathing exercises at home. This will help to prevent a lung infection. Keep all follow-up visits. This is important. Contact a health care provider if: You have any of these signs of infection: More redness, swelling, or pain around an incision. Fluid or blood coming from an incision. Warmth coming from an incision. Pus or a bad smell coming from an incision. A fever or chills. You have pain that gets worse or does not get better with medicine. You have nausea or vomiting. You have a cough. You have shortness of breath. You have not had a bowel movement in 3 days. You are not able to urinate. Get help right away if you have: Severe pain in your abdomen. Persistent nausea and vomiting. Redness, warmth, or pain in your leg. Chest pain. Trouble breathing. These symptoms may represent a serious problem that is an emergency. Do not wait to see if  the symptoms will go away. Get medical help right away. Call your local emergency services (911 in the [place] Do not drive yourself to the hospital. Summary After this procedure, it is common to have pain, discomfort, or soreness. Follow instructions from your health care provider about how to take care of your incision. Check your incision area every day for signs of infection. Report any signs of infection to your health care provider. Keep all follow-up visits. This is important. This information is not intended to replace advice given to you by your health care provider. Make sure you discuss any questions you have with your health care provider. Document Revised: 10/14/2019 Document Reviewed: 10/14/2019 Elsevier Patient Education  2023 [employer] General Anesthesia, Adult, Care After The following information offers guidance on how to care for yourself after your procedure. Your health care provider may also give you more specific instructions. If you have problems or questions, contact your health care provider. What can I  expect after the procedure? After the procedure, it is common for people to: Have pain or discomfort at the IV site. Have nausea or vomiting. Have a sore throat or hoarseness. Have trouble concentrating. Feel cold or chills. Feel weak, sleepy, or tired (fatigue). Have soreness and body aches. These can affect parts of the body that were not involved in surgery. Follow these instructions at home: For the time period you were told by your health care provider:  Rest. Do not participate in activities where you could fall or become injured. Do not drive or use machinery. Do not drink alcohol. Do not take sleeping pills or medicines that cause drowsiness. Do not make important decisions or sign legal documents. Do not take care of children on your own. General instructions Drink enough fluid to keep your urine pale yellow. If you have sleep apnea, surgery and  certain medicines can increase your risk for breathing problems. Follow instructions from your health care provider about wearing your sleep device: Anytime you are sleeping, including during daytime naps. While taking prescription pain medicines, sleeping medicines, or medicines that make you drowsy. Return to your normal activities as told by your health care provider. Ask your health care provider what activities are safe for you. Take over-the-counter and prescription medicines only as told by your health care provider. Do not use any products that contain nicotine or tobacco. These products include cigarettes, chewing tobacco, and vaping devices, such as e-cigarettes. These can delay incision healing after surgery. If you need help quitting, ask your health care provider. Contact a health care provider if: You have nausea or vomiting that does not get better with medicine. You vomit every time you eat or drink. You have pain that does not get better with medicine. You cannot urinate or have bloody urine. You develop a skin rash. You have a fever. Get help right away if: You have trouble breathing. You have chest pain. You vomit blood. These symptoms may be an emergency. Get help right away. Call 911. Do not wait to see if the symptoms will go away. Do not drive yourself to the hospital. Summary After the procedure, it is common to have a sore throat, hoarseness, nausea, vomiting, or to feel weak, sleepy, or fatigue. For the time period you were told by your health care provider, do not drive or use machinery. Get help right away if you have difficulty breathing, have chest pain, or vomit blood. These symptoms may be an emergency. This information is not intended to replace advice given to you by your health care provider. Make sure you discuss any questions you have with your health care provider. Document Revised: 05/24/2021 Document Reviewed: 05/24/2021 Elsevier Patient Education   2023 [employer]. How to Use Chlorhexidine Before Surgery Chlorhexidine gluconate (CHG) is a germ-killing (antiseptic) solution that is used to clean the skin. It can get rid of the bacteria that normally live on the skin and can keep them away for about 24 hours. To clean your skin with CHG, you may be given: A CHG solution to use in the shower or as part of a sponge bath. A prepackaged cloth that contains CHG. Cleaning your skin with CHG may help lower the risk for infection: While you are staying in the intensive care unit of the hospital. If you have a vascular access, such as a central line, to provide short-term or long-term access to your veins. If you have a catheter to drain urine from your bladder. If you  are on a ventilator. A ventilator is a machine that helps you breathe by moving air in and out of your lungs. After surgery. What are the risks? Risks of using CHG include: A skin reaction. Hearing loss, if CHG gets in your ears and you have a perforated eardrum. Eye injury, if CHG gets in your eyes and is not rinsed out. The CHG product catching fire. Make sure that you avoid smoking and flames after applying CHG to your skin. Do not use CHG: If you have a chlorhexidine allergy or have previously reacted to chlorhexidine. On babies younger than 60 months of age. How to use CHG solution Use CHG only as told by your health care provider, and follow the instructions on the label. Use the full amount of CHG as directed. Usually, this is one bottle. During a shower Follow these steps when using CHG solution during a shower (unless your health care provider gives you different instructions): Start the shower. Use your normal soap and shampoo to wash your face and hair. Turn off the shower or move out of the shower stream. Pour the CHG onto a clean washcloth. Do not use any type of brush or rough-edged sponge. Starting at your neck, lather your body down to your toes. Make sure you  follow these instructions: If you will be having surgery, pay special attention to the part of your body where you will be having surgery. Scrub this area for at least 1 minute. Do not use CHG on your head or face. If the solution gets into your ears or eyes, rinse them well with water. Avoid your genital area. Avoid any areas of skin that have broken skin, cuts, or scrapes. Scrub your back and under your arms. Make sure to wash skin folds. Let the lather sit on your skin for 1-2 minutes or as long as told by your health care provider. Thoroughly rinse your entire body in the shower. Make sure that all body creases and crevices are rinsed well. Dry off with a clean towel. Do not put any substances on your body afterward--such as powder, lotion, or perfume--unless you are told to do so by your health care provider. Only use lotions that are recommended by the manufacturer. Put on clean clothes or pajamas. If it is the night before your surgery, sleep in clean sheets.  During a sponge bath Follow these steps when using CHG solution during a sponge bath (unless your health care provider gives you different instructions): Use your normal soap and shampoo to wash your face and hair. Pour the CHG onto a clean washcloth. Starting at your neck, lather your body down to your toes. Make sure you follow these instructions: If you will be having surgery, pay special attention to the part of your body where you will be having surgery. Scrub this area for at least 1 minute. Do not use CHG on your head or face. If the solution gets into your ears or eyes, rinse them well with water. Avoid your genital area. Avoid any areas of skin that have broken skin, cuts, or scrapes. Scrub your back and under your arms. Make sure to wash skin folds. Let the lather sit on your skin for 1-2 minutes or as long as told by your health care provider. Using a different clean, wet washcloth, thoroughly rinse your entire body.  Make sure that all body creases and crevices are rinsed well. Dry off with a clean towel. Do not put any substances on your  body afterward--such as powder, lotion, or perfume--unless you are told to do so by your health care provider. Only use lotions that are recommended by the manufacturer. Put on clean clothes or pajamas. If it is the night before your surgery, sleep in clean sheets. How to use CHG prepackaged cloths Only use CHG cloths as told by your health care provider, and follow the instructions on the label. Use the CHG cloth on clean, dry skin. Do not use the CHG cloth on your head or face unless your health care provider tells you to. When washing with the CHG cloth: Avoid your genital area. Avoid any areas of skin that have broken skin, cuts, or scrapes. Before surgery Follow these steps when using a CHG cloth to clean before surgery (unless your health care provider gives you different instructions): Using the CHG cloth, vigorously scrub the part of your body where you will be having surgery. Scrub using a back-and-forth motion for 3 minutes. The area on your body should be completely wet with CHG when you are done scrubbing. Do not rinse. Discard the cloth and let the area air-dry. Do not put any substances on the area afterward, such as powder, lotion, or perfume. Put on clean clothes or pajamas. If it is the night before your surgery, sleep in clean sheets.  For general bathing Follow these steps when using CHG cloths for general bathing (unless your health care provider gives you different instructions). Use a separate CHG cloth for each area of your body. Make sure you wash between any folds of skin and between your fingers and toes. Wash your body in the following order, switching to a new cloth after each step: The front of your neck, shoulders, and chest. Both of your arms, under your arms, and your hands. Your stomach and groin area, avoiding the genitals. Your right  leg and foot. Your left leg and foot. The back of your neck, your back, and your buttocks. Do not rinse. Discard the cloth and let the area air-dry. Do not put any substances on your body afterward--such as powder, lotion, or perfume--unless you are told to do so by your health care provider. Only use lotions that are recommended by the manufacturer. Put on clean clothes or pajamas. Contact a health care provider if: Your skin gets irritated after scrubbing. You have questions about using your solution or cloth. You swallow any chlorhexidine. Call your local poison control center ([phone] in the [place] Get help right away if: Your eyes itch badly, or they become very red or swollen. Your skin itches badly and is red or swollen. Your hearing changes. You have trouble seeing. You have swelling or tingling in your mouth or throat. You have trouble breathing. These symptoms may represent a serious problem that is an emergency. Do not wait to see if the symptoms will go away. Get medical help right away. Call your local emergency services (911 in the [place] Do not drive yourself to the hospital. Summary Chlorhexidine gluconate (CHG) is a germ-killing (antiseptic) solution that is used to clean the skin. Cleaning your skin with CHG may help to lower your risk for infection. You may be given CHG to use for bathing. It may be in a bottle or in a prepackaged cloth to use on your skin. Carefully follow your health care provider's instructions and the instructions on the product label. Do not use CHG if you have a chlorhexidine allergy. Contact your health care provider if your skin gets irritated  after scrubbing. This information is not intended to replace advice given to you by your health care provider. Make sure you discuss any questions you have with your health care provider. Document Revised: 06/24/2021 Document Reviewed: 05/07/2020 Elsevier Patient Education  [place].

## 2022-03-12 ENCOUNTER — Encounter (HOSPITAL_COMMUNITY): Payer: Self-pay

## 2022-03-12 ENCOUNTER — Encounter (HOSPITAL_COMMUNITY)
Admission: RE | Admit: 2022-03-12 | Discharge: 2022-03-12 | Disposition: A | Discharge status: Home / Self Care | Payer: Medicare Other | Source: Ambulatory Visit | Attending: General Surgery | Admitting: General Surgery

## 2022-03-12 VITALS — BP 146/67 | HR 54 | Temp 97.7°F | Resp 18 | Ht 64.0 in | Wt 189.6 lb

## 2022-03-12 DIAGNOSIS — K429 Umbilical hernia without obstruction or gangrene: Secondary | ICD-10-CM | POA: Diagnosis not present

## 2022-03-12 DIAGNOSIS — R001 Bradycardia, unspecified: Secondary | ICD-10-CM | POA: Diagnosis not present

## 2022-03-12 DIAGNOSIS — Z01818 Encounter for other preprocedural examination: Secondary | ICD-10-CM

## 2022-03-12 DIAGNOSIS — F419 Anxiety disorder, unspecified: Secondary | ICD-10-CM | POA: Diagnosis not present

## 2022-03-12 DIAGNOSIS — Z7902 Long term (current) use of antithrombotics/antiplatelets: Secondary | ICD-10-CM | POA: Diagnosis not present

## 2022-03-12 DIAGNOSIS — E119 Type 2 diabetes mellitus without complications: Secondary | ICD-10-CM | POA: Diagnosis not present

## 2022-03-12 DIAGNOSIS — F32A Depression, unspecified: Secondary | ICD-10-CM | POA: Diagnosis not present

## 2022-03-12 DIAGNOSIS — I10 Essential (primary) hypertension: Secondary | ICD-10-CM | POA: Diagnosis not present

## 2022-03-12 DIAGNOSIS — Z7984 Long term (current) use of oral hypoglycemic drugs: Secondary | ICD-10-CM | POA: Diagnosis not present

## 2022-03-12 DIAGNOSIS — I69351 Hemiplegia and hemiparesis following cerebral infarction affecting right dominant side: Secondary | ICD-10-CM | POA: Diagnosis not present

## 2022-03-12 LAB — TYPE AND SCREEN
ABO/RH(D): O POS
Antibody Screen: NEGATIVE

## 2022-03-14 ENCOUNTER — Encounter (HOSPITAL_COMMUNITY)
Admission: RE | Disposition: A | Discharge status: Home / Self Care | Payer: Self-pay | Source: Home / Self Care | Attending: General Surgery

## 2022-03-14 ENCOUNTER — Encounter (HOSPITAL_COMMUNITY): Payer: Self-pay | Admitting: General Surgery

## 2022-03-14 ENCOUNTER — Ambulatory Visit (HOSPITAL_COMMUNITY)
Admission: RE | Admit: 2022-03-14 | Discharge: 2022-03-14 | Disposition: A | Discharge status: Home / Self Care | Payer: Medicare Other | Attending: General Surgery | Admitting: General Surgery

## 2022-03-14 ENCOUNTER — Ambulatory Visit (HOSPITAL_COMMUNITY): Payer: Medicare Other | Admitting: Certified Registered"

## 2022-03-14 ENCOUNTER — Other Ambulatory Visit: Payer: Self-pay

## 2022-03-14 DIAGNOSIS — R001 Bradycardia, unspecified: Secondary | ICD-10-CM | POA: Insufficient documentation

## 2022-03-14 DIAGNOSIS — F419 Anxiety disorder, unspecified: Secondary | ICD-10-CM | POA: Diagnosis not present

## 2022-03-14 DIAGNOSIS — Z7984 Long term (current) use of oral hypoglycemic drugs: Secondary | ICD-10-CM | POA: Insufficient documentation

## 2022-03-14 DIAGNOSIS — F32A Depression, unspecified: Secondary | ICD-10-CM | POA: Diagnosis not present

## 2022-03-14 DIAGNOSIS — Z7902 Long term (current) use of antithrombotics/antiplatelets: Secondary | ICD-10-CM | POA: Insufficient documentation

## 2022-03-14 DIAGNOSIS — I10 Essential (primary) hypertension: Secondary | ICD-10-CM | POA: Diagnosis not present

## 2022-03-14 DIAGNOSIS — K429 Umbilical hernia without obstruction or gangrene: Secondary | ICD-10-CM | POA: Insufficient documentation

## 2022-03-14 DIAGNOSIS — E119 Type 2 diabetes mellitus without complications: Secondary | ICD-10-CM | POA: Insufficient documentation

## 2022-03-14 DIAGNOSIS — I69351 Hemiplegia and hemiparesis following cerebral infarction affecting right dominant side: Secondary | ICD-10-CM | POA: Insufficient documentation

## 2022-03-14 HISTORY — PX: XI ROBOTIC ASSISTED VENTRAL HERNIA: SHX6789

## 2022-03-14 LAB — ABO/RH: ABO/RH(D): O POS

## 2022-03-14 SURGERY — REPAIR, HERNIA, VENTRAL, ROBOT-ASSISTED
Anesthesia: General | Site: Abdomen

## 2022-03-14 MED ORDER — LIDOCAINE HCL (PF) 2 % IJ SOLN
INTRAMUSCULAR | Status: AC
  Filled 2022-03-14: qty 15

## 2022-03-14 MED ORDER — BUPIVACAINE LIPOSOME 1.3 % IJ SUSP
INTRAMUSCULAR | Status: AC
  Filled 2022-03-14: qty 20

## 2022-03-14 MED ORDER — HYDROMORPHONE HCL 1 MG/ML IJ SOLN: 0.2500 mg | INTRAMUSCULAR | Status: DC | PRN

## 2022-03-14 MED ORDER — DEXAMETHASONE SODIUM PHOSPHATE 10 MG/ML IJ SOLN
INTRAMUSCULAR | Status: DC | PRN
  Administered 2022-03-14: 10 mg via INTRAVENOUS

## 2022-03-14 MED ORDER — ONDANSETRON HCL 4 MG/2ML IJ SOLN
INTRAMUSCULAR | Status: DC | PRN
  Administered 2022-03-14: 4 mg via INTRAVENOUS

## 2022-03-14 MED ORDER — DEXAMETHASONE SODIUM PHOSPHATE 10 MG/ML IJ SOLN
INTRAMUSCULAR | Status: AC
  Filled 2022-03-14: qty 1

## 2022-03-14 MED ORDER — KETOROLAC TROMETHAMINE 30 MG/ML IJ SOLN
INTRAMUSCULAR | Status: DC | PRN
  Administered 2022-03-14: 15 mg via INTRAVENOUS

## 2022-03-14 MED ORDER — ORAL CARE MOUTH RINSE: 15.0000 mL | Freq: Once | OROMUCOSAL | Status: AC

## 2022-03-14 MED ORDER — CHLORHEXIDINE GLUCONATE 0.12 % MT SOLN: 15.0000 mL | Freq: Once | OROMUCOSAL | Status: AC

## 2022-03-14 MED ORDER — STERILE WATER FOR IRRIGATION IR SOLN
Status: DC | PRN
  Administered 2022-03-14: 500 mL

## 2022-03-14 MED ORDER — CEFAZOLIN SODIUM-DEXTROSE 2-4 GM/100ML-% IV SOLN
2.0000 g | INTRAVENOUS | Status: AC
  Administered 2022-03-14: 2 g via INTRAVENOUS

## 2022-03-14 MED ORDER — CHLORHEXIDINE GLUCONATE CLOTH 2 % EX PADS: 6.0000 | MEDICATED_PAD | Freq: Once | CUTANEOUS | Status: DC

## 2022-03-14 MED ORDER — LIDOCAINE 2% (20 MG/ML) 5 ML SYRINGE
INTRAMUSCULAR | Status: DC | PRN
  Administered 2022-03-14: 1.5 mg/kg/h via INTRAVENOUS

## 2022-03-14 MED ORDER — CHLORHEXIDINE GLUCONATE 0.12 % MT SOLN
OROMUCOSAL | Status: AC
  Administered 2022-03-14: 15 mL via OROMUCOSAL
  Filled 2022-03-14: qty 15

## 2022-03-14 MED ORDER — BUPIVACAINE HCL (PF) 0.5 % IJ SOLN
INTRAMUSCULAR | Status: AC
  Filled 2022-03-14: qty 30

## 2022-03-14 MED ORDER — ONDANSETRON HCL 4 MG/2ML IJ SOLN
INTRAMUSCULAR | Status: AC
  Filled 2022-03-14: qty 2

## 2022-03-14 MED ORDER — PROPOFOL 10 MG/ML IV BOLUS
INTRAVENOUS | Status: DC | PRN
  Administered 2022-03-14: 30 mg via INTRAVENOUS
  Administered 2022-03-14: 150 mg via INTRAVENOUS

## 2022-03-14 MED ORDER — LACTATED RINGERS IV SOLN: INTRAVENOUS | Status: DC | PRN

## 2022-03-14 MED ORDER — KETOROLAC TROMETHAMINE 30 MG/ML IJ SOLN
INTRAMUSCULAR | Status: AC
  Filled 2022-03-14: qty 1

## 2022-03-14 MED ORDER — MIDAZOLAM HCL 5 MG/5ML IJ SOLN
INTRAMUSCULAR | Status: DC | PRN
  Administered 2022-03-14: 2 mg via INTRAVENOUS

## 2022-03-14 MED ORDER — BUPIVACAINE-EPINEPHRINE (PF) 0.25% -1:200000 IJ SOLN
INTRAMUSCULAR | Status: AC
  Filled 2022-03-14: qty 30

## 2022-03-14 MED ORDER — SUGAMMADEX SODIUM 200 MG/2ML IV SOLN
INTRAVENOUS | Status: DC | PRN
  Administered 2022-03-14: 175 mg via INTRAVENOUS

## 2022-03-14 MED ORDER — CEFAZOLIN SODIUM-DEXTROSE 2-4 GM/100ML-% IV SOLN
INTRAVENOUS | Status: AC
  Filled 2022-03-14: qty 100

## 2022-03-14 MED ORDER — BUPIVACAINE LIPOSOME 1.3 % IJ SUSP
INTRAMUSCULAR | Status: DC | PRN
  Administered 2022-03-14: 50 mL

## 2022-03-14 MED ORDER — LACTATED RINGERS IV SOLN: INTRAVENOUS | Status: DC

## 2022-03-14 MED ORDER — MIDAZOLAM HCL 2 MG/2ML IJ SOLN
INTRAMUSCULAR | Status: AC
  Filled 2022-03-14: qty 2

## 2022-03-14 MED ORDER — ROCURONIUM BROMIDE 10 MG/ML (PF) SYRINGE
PREFILLED_SYRINGE | INTRAVENOUS | Status: AC
  Filled 2022-03-14: qty 10

## 2022-03-14 MED ORDER — GLYCOPYRROLATE 0.2 MG/ML IJ SOLN
INTRAMUSCULAR | Status: DC | PRN
  Administered 2022-03-14 (×2): .1 mg via INTRAVENOUS

## 2022-03-14 MED ORDER — ONDANSETRON HCL 4 MG PO TABS: 4.0000 mg | ORAL_TABLET | Freq: Three times a day (TID) | ORAL | 1 refills | Status: DC | PRN

## 2022-03-14 MED ORDER — PROPOFOL 10 MG/ML IV BOLUS
INTRAVENOUS | Status: AC
  Filled 2022-03-14: qty 20

## 2022-03-14 MED ORDER — MEPERIDINE HCL 50 MG/ML IJ SOLN: 6.2500 mg | INTRAMUSCULAR | Status: DC | PRN

## 2022-03-14 MED ORDER — ONDANSETRON HCL 4 MG/2ML IJ SOLN: 4.0000 mg | Freq: Once | INTRAMUSCULAR | Status: DC | PRN

## 2022-03-14 MED ORDER — OXYCODONE HCL 5 MG PO TABS: 5.0000 mg | ORAL_TABLET | ORAL | 0 refills | Status: DC | PRN

## 2022-03-14 MED ORDER — LIDOCAINE HCL (CARDIAC) PF 100 MG/5ML IV SOSY
PREFILLED_SYRINGE | INTRAVENOUS | Status: DC | PRN
  Administered 2022-03-14: 80 mg via INTRATRACHEAL

## 2022-03-14 MED ORDER — KETAMINE HCL 50 MG/5ML IJ SOSY
PREFILLED_SYRINGE | INTRAMUSCULAR | Status: AC
  Filled 2022-03-14: qty 5

## 2022-03-14 MED ORDER — FENTANYL CITRATE (PF) 100 MCG/2ML IJ SOLN
INTRAMUSCULAR | Status: DC | PRN
  Administered 2022-03-14 (×4): 25 ug via INTRAVENOUS

## 2022-03-14 MED ORDER — KETAMINE HCL 10 MG/ML IJ SOLN
INTRAMUSCULAR | Status: DC | PRN
  Administered 2022-03-14 (×3): 10 mg via INTRAVENOUS

## 2022-03-14 MED ORDER — BUPIVACAINE HCL (PF) 0.25 % IJ SOLN
INTRAMUSCULAR | Status: AC
  Filled 2022-03-14: qty 30

## 2022-03-14 MED ORDER — ROCURONIUM BROMIDE 100 MG/10ML IV SOLN
INTRAVENOUS | Status: DC | PRN
  Administered 2022-03-14 (×2): 10 mg via INTRAVENOUS
  Administered 2022-03-14: 60 mg via INTRAVENOUS
  Administered 2022-03-14: 10 mg via INTRAVENOUS

## 2022-03-14 MED ORDER — FENTANYL CITRATE (PF) 250 MCG/5ML IJ SOLN
INTRAMUSCULAR | Status: AC
  Filled 2022-03-14: qty 5

## 2022-03-14 MED ORDER — GLYCOPYRROLATE PF 0.2 MG/ML IJ SOSY
PREFILLED_SYRINGE | INTRAMUSCULAR | Status: AC
  Filled 2022-03-14: qty 2

## 2022-03-14 SURGICAL SUPPLY — 51 items
ADH SKN CLS APL DERMABOND .7 (GAUZE/BANDAGES/DRESSINGS) ×1
APL PRP STRL LF DISP 70% ISPRP (MISCELLANEOUS) ×1
BLADE SURG 15 STRL LF DISP TIS (BLADE) ×1 IMPLANT
BLADE SURG 15 STRL SS (BLADE) ×1
CHLORAPREP W/TINT 26 (MISCELLANEOUS) ×1 IMPLANT
COVER LIGHT HANDLE STERIS (MISCELLANEOUS) ×2 IMPLANT
COVER MAYO STAND XLG (MISCELLANEOUS) ×1 IMPLANT
COVER TIP SHEARS 8 DVNC (MISCELLANEOUS) ×1 IMPLANT
COVER TIP SHEARS 8MM DA VINCI (MISCELLANEOUS) ×1
DEFOGGER SCOPE WARMER CLEARIFY (MISCELLANEOUS) IMPLANT
DERMABOND ADVANCED .7 DNX12 (GAUZE/BANDAGES/DRESSINGS) ×1 IMPLANT
DRAPE ARM DVNC X/XI (DISPOSABLE) ×3 IMPLANT
DRAPE COLUMN DVNC XI (DISPOSABLE) ×1 IMPLANT
DRAPE DA VINCI XI ARM (DISPOSABLE) ×3
DRAPE DA VINCI XI COLUMN (DISPOSABLE) ×1
ELECT REM PT RETURN 9FT ADLT (ELECTROSURGICAL) ×1
ELECTRODE REM PT RTRN 9FT ADLT (ELECTROSURGICAL) ×1 IMPLANT
GLOVE BIO SURGEON STRL SZ 6.5 (GLOVE) ×2 IMPLANT
GLOVE BIOGEL PI IND STRL 6.5 (GLOVE) ×2 IMPLANT
GLOVE BIOGEL PI IND STRL 7.0 (GLOVE) ×2 IMPLANT
GLOVE SURG SS PI 7.0 STRL IVOR (GLOVE) IMPLANT
GOWN STRL REUS W/TWL LRG LVL3 (GOWN DISPOSABLE) ×3 IMPLANT
GRASPER SUT TROCAR 14GX15 (MISCELLANEOUS) IMPLANT
KIT PINK PAD W/HEAD ARE REST (MISCELLANEOUS) ×1
KIT PINK PAD W/HEAD ARM REST (MISCELLANEOUS) ×1 IMPLANT
MANIFOLD NEPTUNE II (INSTRUMENTS) ×1 IMPLANT
MESH VENTRALIGHT ST 4.5 ECHO (Mesh General) IMPLANT
MESH VENTRALIGHT ST 4X6IN (Mesh General) IMPLANT
NDL HYPO 21X1.5 SAFETY (NEEDLE) ×1 IMPLANT
NDL INSUFFLATION 14GA 120MM (NEEDLE) ×1 IMPLANT
NEEDLE HYPO 21X1.5 SAFETY (NEEDLE) ×1 IMPLANT
NEEDLE INSUFFLATION 14GA 120MM (NEEDLE) ×1 IMPLANT
OBTURATOR OPTICAL STANDARD 8MM (TROCAR) ×1
OBTURATOR OPTICAL STND 8 DVNC (TROCAR) ×1
OBTURATOR OPTICALSTD 8 DVNC (TROCAR) ×1 IMPLANT
PACK LAP CHOLECYSTECTOMY (MISCELLANEOUS) ×1 IMPLANT
SEAL CANN UNIV 5-8 DVNC XI (MISCELLANEOUS) ×3 IMPLANT
SEAL XI 5MM-8MM UNIVERSAL (MISCELLANEOUS) ×3
SET BASIN LINEN APH (SET/KITS/TRAYS/PACK) ×1 IMPLANT
SET TUBE SMOKE EVAC HIGH FLOW (TUBING) ×1 IMPLANT
SUT MNCRL AB 4-0 PS2 18 (SUTURE) ×1 IMPLANT
SUT STRATAFIX PDS 30 CT-1 (SUTURE) ×1 IMPLANT
SUT V-LOC 90 ABS 3-0 VLT  V-20 (SUTURE) ×4
SUT V-LOC 90 ABS 3-0 VLT V-20 (SUTURE) ×2 IMPLANT
SUT VIC AB 2-0 SH 27 (SUTURE) ×1
SUT VIC AB 2-0 SH 27X BRD (SUTURE) IMPLANT
SYR 20ML LL LF (SYRINGE) ×1 IMPLANT
TAPE TRANSPORE STRL 2 31045 (GAUZE/BANDAGES/DRESSINGS) ×1 IMPLANT
TRAY FOLEY W/BAG SLVR 16FR (SET/KITS/TRAYS/PACK) ×1
TRAY FOLEY W/BAG SLVR 16FR ST (SET/KITS/TRAYS/PACK) IMPLANT
WATER STERILE IRR 500ML POUR (IV SOLUTION) ×1 IMPLANT

## 2022-03-14 NOTE — Discharge Instructions (Signed)
Discharge Robotic Assisted Laparoscopic Surgery Instructions:  Common Complaints: Right shoulder pain is common after laparoscopic surgery.  This is secondary to the gas used in the surgery being trapped under the diaphragm.  Walk to help your body absorb the gas. This will improve in a few days. Pain at the port sites are common, especially the larger port sites. This will improve with time.  Some nausea is common and poor appetite. The main goal is to stay hydrated the first few days after surgery.   Diet/ Activity: Restart your plavix Monday 03/17/2021. Remove the bandage from your bellybutton Monday 03/17/2021.   Diet as tolerated. You may not have an appetite, but it is important to stay hydrated.  Drink 64 ounces of water a day. Your appetite will return with time.  Shower per your regular routine daily.  Do not take hot showers. Take warm showers that are less than 10 minutes. Rest and listen to your body, but do not remain in bed all day.  Walk everyday for at least 15-20 minutes. Deep cough and move around every 1-2 hours in the first few days after surgery.  Do not lift > 10 lbs, perform excessive bending, pushing, pulling, squatting for 4 weeks after surgery.  Do not pick at the dermabond glue on your incision sites.  This glue film will remain in place for 1-2 weeks and will start to peel off.  Do not place lotions or balms on your incision unless instructed to specifically by Dr. Constance Haw.   Pain Expectations and Narcotics: -After surgery you will have pain associated with your incisions and this is normal. The pain is muscular and nerve pain, and will get better with time. -You are encouraged and expected to take non narcotic medications like tylenol and ibuprofen (when able) to treat pain as multiple modalities can aid with pain treatment. -Narcotics are only used when pain is severe or there is breakthrough pain. -You are not expected to have a pain score of 0 after surgery, as  we cannot prevent pain. A pain score of 3-4 that allows you to be functional, move, walk, and tolerate some activity is the goal. The pain will continue to improve over the days after surgery and is dependent on your surgery. -Due to [place] law, we are only able to give a certain amount of pain medication to treat post operative pain, and we only give additional narcotics on a patient by patient basis.  -For most laparoscopic surgery, studies have shown that the majority of patients only need 10-15 narcotic pills, and for open surgeries most patients only need 15-20.   -Having appropriate expectations of pain and knowledge of pain management with non narcotics is important as we do not want anyone to become addicted to narcotic pain medication.  -Using ice packs in the first 48 hours and heating pads after 48 hours, wearing an abdominal binder (when recommended), and using over the counter medications are all ways to help with pain management.   -Simple acts like meditation and mindfulness practices after surgery can also help with pain control and research has proven the benefit of these practices.  Medication: Take tylenol and ibuprofen as needed for pain control, alternating every 4-6 hours.  Example:  Tylenol 1000mg  @ 6am, 12noon, 6pm, 68midnight (Do not exceed 4000mg  of tylenol a day). Ibuprofen 800mg  @ 9am, 3pm, 9pm, 3am (Do not exceed 3600mg  of ibuprofen a day).  Take Roxicodone for breakthrough pain every 4 hours.  Take Colace for  constipation related to narcotic pain medication. If you do not have a bowel movement in 2 days, take Miralax over the counter.  Drink plenty of water to also prevent constipation.   Contact Information: If you have questions or concerns, please call our office, [phone], Monday- Thursday 8AM-5PM and Friday 8AM-12Noon.  If it is after hours or on the weekend, please call [facility], [phone], [phone], and ask to speak to the surgeon on call for Dr.  Constance Haw at [facility]

## 2022-03-14 NOTE — Op Note (Signed)
[facility] Operative Note  03/14/22  Preoperative Diagnosis:  Umbilical hernia    Postoperative Diagnosis: Same   Procedure(s) Performed: Robotic assisted laparoscopic ventral hernia with mesh Ventralight ST (cut to 10-11cm round), defect size 3cm   Surgeon: Lanell Matar. Constance Haw, MD   Assistants: No qualified resident was available    Anesthesia: General endotracheal   Anesthesiologist: Denese Killings, MD    Specimens: None    Estimated Blood Loss: Minimal   Blood Replacement: None    Complications: None   Wound Class: Clean   Operative Indications: Mr. Limas is a 62 yo who comes in with an umbilical hernia that is getting worse and is growing. We discussed repair and options of open versus robotic assisted laparoscopic repair and use of mesh. Given the size of his defect, I felt we would get a better coverage with a laparoscopic repair. Discussed risk of bleeding, infection, recurrence, and use of mesh.   Findings: 3cm defect Tension free repair achieved with 3cm mesh and suture Adequate hemostasis    Procedure: The patient was brought to the operating room and general anesthesia was induced. A time-out was completed verifying correct patient, procedure, site, positioning, and implant(s) and/or special equipment prior to beginning this procedure. Antibiotics were administered prior to making the incision. SCDs placed. The anterior abdominal wall was prepped and draped in the standard sterile fashion.   Palmer's point was used and a a stab incision was made. A towel clip was used to elevate the abdominal wall. A Veress needle placed and the saline drop test was performed. The abdomen was insufflated to 15cm without issues. Lateral ports on the left side were placed using the optiview ports, placing the 43mm port at Palmer's point incision site, and a 79mm central camera port first and one left left quadrant 21mm port with adequate space between the ports  and over 8cm away from the edge of the defect.  The [place] robot then docked into place.  The hernia contents noted and reduced with combination of blunt, sharp dissection with scissors and fenestrated forceps. Once all hernia contents reduced, there was noted to be a 3cm hernia defect.  Hemostasis achieved throughout this portion. The Ventralight ST mesh marked centrally with a 2-0 Vicryl loop (10.2X15.2 was cut to circular size) and 1 stratafix and 4 V-lock sutures were placed in the abdominal cavity under direct visualization.  Any fat overlying the peritoneal layer was taken down creating flaps to clear the space for adequate fixation of the mesh.   A transfacial suture with 0 stratafix used to primarily close defect under minimal tension and the umbilicus was tacked down to the fascia within this closure.  The stratafix was then passed through the center of the mesh (coated side out) to secured the mesh to the abdominal wall centered over the defect.  The mesh was then circumferentially sutured into the anterior abdominal wall using 3-0 VLock x4.  Any bleeding noted during this portion was no longer actively bleeding by end of securing mesh and tightening the suture.   All sutures and needles were removed.   The robot was undocked.  The abdomen was then de-sufflated and the ports were removed.  All skin incisions closed with subcuticular 4-0 Monocryl and dermabond.  2X2 gauze and tegaderm was placed in the umbilicus to help the skin to adhere down. Final inspection revealed acceptable hemostasis.   All counts were correct at the end of the case. The patient was awakened from  anesthesia and extubated without complication.  The patient went to the PACU in stable condition.   Curlene Labrum, MD [facility] [address] [place], [place] [address] [phone] (office)

## 2022-03-14 NOTE — Progress Notes (Signed)
[facility]  Updated family. No lifting over 10-15 lbs for 4 weeks. Rx to [facility]. Binder with activity.  Will see in 4 weeks.  Restart plavix 1/8 and remove bandage 1/8.  Curlene Labrum, MD [facility] [address] [place], [place] [address] [phone] (office)

## 2022-03-14 NOTE — Anesthesia Procedure Notes (Signed)
Procedure Name: Intubation Date/Time: 03/14/2022 10:34 AM  Performed by: Gwyndolyn Saxon, CRNAPre-anesthesia Checklist: Patient identified, Emergency Drugs available, Suction available and Patient being monitored Patient Re-evaluated:Patient Re-evaluated prior to induction Oxygen Delivery Method: Circle system utilized Preoxygenation: Pre-oxygenation with 100% oxygen Induction Type: IV induction Ventilation: Mask ventilation without difficulty Laryngoscope Size: Miller and 2 Grade View: Grade I Tube type: Oral Tube size: 7.5 mm Number of attempts: 1 Airway Equipment and Method: Patient positioned with wedge pillow and Stylet Placement Confirmation: ETT inserted through vocal cords under direct vision, positive ETCO2 and breath sounds checked- equal and bilateral Secured at: 23 cm Tube secured with: Tape Dental Injury: Teeth and Oropharynx as per pre-operative assessment

## 2022-03-14 NOTE — Transfer of Care (Signed)
Immediate Anesthesia Transfer of Care Note  Patient: Bruce Guerrero  Procedure(s) Performed: XI ROBOTIC ASSISTED VENTRAL HERNIA W/ MESH (Abdomen)  Patient Location: PACU  Anesthesia Type:General  Level of Consciousness: drowsy  Airway & Oxygen Therapy: Patient Spontanous Breathing and Patient connected to face mask oxygen  Post-op Assessment: Report given to RN and Post -op Vital signs reviewed and stable  Post vital signs: Reviewed and stable  Last Vitals:  Vitals Value Taken Time  BP 149/75 03/14/22 1315  Temp    Pulse 58 03/14/22 1316  Resp 11 03/14/22 1316  SpO2 100 % 03/14/22 1316  Vitals shown include unvalidated device data.  Last Pain:  Vitals:   03/14/22 0900  TempSrc: Oral  PainSc:          Complications: No notable events documented.

## 2022-03-14 NOTE — Anesthesia Preprocedure Evaluation (Signed)
Anesthesia Evaluation  Patient identified by MRN, date of birth, ID band Patient awake    Reviewed: Allergy & Precautions, H&P , NPO status , Patient's Chart, lab work & pertinent test results  Airway Mallampati: II  TM Distance: >3 FB Neck ROM: Full    Dental  (+) Dental Advisory Given, Edentulous Upper, Missing   Pulmonary neg pulmonary ROS   Pulmonary exam normal breath sounds clear to auscultation       Cardiovascular hypertension, Pt. on medications Normal cardiovascular exam Rhythm:Regular Rate:Bradycardia     Neuro/Psych  PSYCHIATRIC DISORDERS Anxiety Depression    CVA (right sided weakness), Residual Symptoms    GI/Hepatic negative GI ROS, Neg liver ROS,,,  Endo/Other  diabetes, Well Controlled, Type 2, Oral Hypoglycemic Agents    Renal/GU negative Renal ROS  negative genitourinary   Musculoskeletal negative musculoskeletal ROS (+)    Abdominal   Peds negative pediatric ROS (+)  Hematology negative hematology ROS (+)   Anesthesia Other Findings   Reproductive/Obstetrics negative OB ROS                             Anesthesia Physical Anesthesia Plan  ASA: 3  Anesthesia Plan: General   Post-op Pain Management: Dilaudid IV   Induction: Intravenous  PONV Risk Score and Plan: 4 or greater and Ondansetron, Dexamethasone and Midazolam  Airway Management Planned: Oral ETT  Additional Equipment:   Intra-op Plan:   Post-operative Plan: Extubation in OR  Informed Consent: I have reviewed the patients History and Physical, chart, labs and discussed the procedure including the risks, benefits and alternatives for the proposed anesthesia with the patient or authorized representative who has indicated his/her understanding and acceptance.     Dental advisory given  Plan Discussed with: CRNA and Surgeon  Anesthesia Plan Comments:         Anesthesia Quick Evaluation

## 2022-03-14 NOTE — Interval H&P Note (Signed)
History and Physical Interval Note:  03/14/2022 9:27 AM  Bruce Guerrero  has presented today for surgery, with the diagnosis of VENTRAL HERNIA, 3- 4 CM.  The various methods of treatment have been discussed with the patient and family. After consideration of risks, benefits and other options for treatment, the patient has consented to  Procedure(s): XI ROBOTIC [name] (N/A) as a surgical intervention.  The patient's history has been reviewed, patient examined, no change in status, stable for surgery.  I have reviewed the patient's chart and labs.  Questions were answered to the patient's satisfaction.     Virl Cagey

## 2022-03-14 NOTE — Anesthesia Postprocedure Evaluation (Signed)
Anesthesia Post Note  Patient: Bruce Guerrero  Procedure(s) Performed: XI ROBOTIC ASSISTED VENTRAL HERNIA W/ MESH (Abdomen)  Patient location during evaluation: Phase II Anesthesia Type: General Level of consciousness: awake and alert and oriented Pain management: pain level controlled Vital Signs Assessment: post-procedure vital signs reviewed and stable Respiratory status: spontaneous breathing, nonlabored ventilation and respiratory function stable Cardiovascular status: blood pressure returned to baseline and stable Postop Assessment: no apparent nausea or vomiting Anesthetic complications: no  No notable events documented.   Last Vitals:  Vitals:   03/14/22 1315 03/14/22 1330  BP: (!) 149/75 125/77  Pulse: 64 64  Resp: 11 11  Temp: 36.6 C   SpO2: 98% 96%    Last Pain:  Vitals:   03/14/22 1315  TempSrc:   PainSc: Asleep                 Creighton Longley C Jissel Slavens

## 2022-03-20 ENCOUNTER — Encounter (HOSPITAL_COMMUNITY): Payer: Self-pay | Admitting: General Surgery

## 2022-04-03 DIAGNOSIS — L570 Actinic keratosis: Secondary | ICD-10-CM | POA: Diagnosis not present

## 2022-04-10 ENCOUNTER — Encounter: Payer: Self-pay | Admitting: General Surgery

## 2022-04-10 ENCOUNTER — Ambulatory Visit (INDEPENDENT_AMBULATORY_CARE_PROVIDER_SITE_OTHER): Payer: Medicare Other | Admitting: General Surgery

## 2022-04-10 VITALS — BP 131/50 | HR 59 | Temp 98.0°F | Resp 12 | Ht 64.0 in | Wt 194.0 lb

## 2022-04-10 DIAGNOSIS — K429 Umbilical hernia without obstruction or gangrene: Secondary | ICD-10-CM

## 2022-04-10 NOTE — Patient Instructions (Addendum)
Can start back lifting and doing more activity after 6 weeks post op.  Start back gradual Can put neosporin on the incision that has the irritated edge from the glue

## 2022-04-10 NOTE — Progress Notes (Signed)
[facility]  Doing well. No issues. Did not need pain medicine. Had some minor constipation.  BP (!) 131/50   Pulse (!) 59   Temp 98 F (36.7 C) (Oral)   Resp 12   Ht 5\' 4"  (1.626 m)   Wt 194 lb (88 kg)   SpO2 96%   BMI 33.30 kg/m  Incision healing, no erythema or drainage, minor irritation around edge, no hernia noted, minor induration  Patient s/p robotic assisted laparoscopic ventral hernia repair with mesh doing well.   Can start back lifting and doing more activity after 6 weeks post op.  Start back gradual Can put neosporin on the incision that has the irritated edge from the glue   Prn follow up   Curlene Labrum, [place] [address] [place], [place] [address] [phone] (office)

## 2022-04-14 ENCOUNTER — Ambulatory Visit (INDEPENDENT_AMBULATORY_CARE_PROVIDER_SITE_OTHER): Payer: Medicare Other | Admitting: Family

## 2022-04-14 ENCOUNTER — Encounter: Payer: Self-pay | Admitting: Family

## 2022-04-14 VITALS — BP 159/62 | HR 56 | Temp 97.2°F | Ht 64.0 in | Wt 191.8 lb

## 2022-04-14 DIAGNOSIS — M654 Radial styloid tenosynovitis [de Quervain]: Secondary | ICD-10-CM

## 2022-04-14 MED ORDER — DICLOFENAC SODIUM 75 MG PO TBEC: 75.0000 mg | DELAYED_RELEASE_TABLET | Freq: Two times a day (BID) | ORAL | 0 refills | Status: DC

## 2022-04-14 NOTE — Progress Notes (Signed)
Subjective:    Patient ID: Bruce Guerrero, male    DOB: 09-10-1960, 62 y.o.   MRN: [id number]  Chief Complaint  Patient presents with   Wrist Pain    Left wrist since November. Seen Dettinger dx with tendonitis and was given a shot was some bette and went away. Takes tylenol and IBP to help manage pain.   Pt presents to the office today with left wrist pain that started 3 months ago. Denies any injury. Reports constant aching pain of 7-8 out 10. He had a steroid injection on 03/07/22 with mild relief.  Wrist Pain  The pain is present in the left wrist. This is a new problem. The current episode started more than 1 month ago. There has been no history of extremity trauma. The problem occurs constantly. The problem has been unchanged. The quality of the pain is described as aching. The pain is at a severity of 7/10. The pain is moderate. Pertinent negatives include no joint swelling, limited range of motion, stiffness or tingling. The symptoms are aggravated by activity. He has tried NSAIDS for the symptoms. The treatment provided mild relief.      Review of Systems  Musculoskeletal:  Negative for stiffness.  Neurological:  Negative for tingling.  All other systems reviewed and are negative.      Objective:   Physical Exam Vitals reviewed.  Constitutional:      General: He is not in acute distress.    Appearance: He is well-developed.  HENT:     Head: Normocephalic.  Eyes:     General:        Right eye: No discharge.        Left eye: No discharge.     Pupils: Pupils are equal, round, and reactive to light.  Neck:     Thyroid: No thyromegaly.  Cardiovascular:     Rate and Rhythm: Normal rate and regular rhythm.     Heart sounds: Normal heart sounds. No murmur heard. Pulmonary:     Effort: Pulmonary effort is normal. No respiratory distress.     Breath sounds: Normal breath sounds. No wheezing.  Abdominal:     General: Bowel sounds are normal. There is no distension.      Palpations: Abdomen is soft.     Tenderness: There is no abdominal tenderness.  Musculoskeletal:        General: No tenderness. Normal range of motion.     Cervical back: Normal range of motion and neck supple.  Skin:    General: Skin is warm and dry.     Findings: No erythema or rash.  Neurological:     Mental Status: He is alert and oriented to person, place, and time.     Cranial Nerves: No cranial nerve deficit.     Deep Tendon Reflexes: Reflexes are normal and symmetric.  Psychiatric:        Behavior: Behavior normal.        Thought Content: Thought content normal.        Judgment: Judgment normal.     BP (!) 159/62   Pulse (!) 56   Temp (!) 97.2 F (36.2 C) (Temporal)   Ht 5\' 4"  (1.626 m)   Wt 191 lb 12.8 oz (87 kg)   SpO2 98%   BMI 32.92 kg/m        Assessment & Plan:  [name] comes in today with chief complaint of Wrist Pain (Left wrist since November. Seen Dettinger dx  with tendonitis and was given a shot was some bette and went away. Takes tylenol and IBP to help manage pain.)   Diagnosis and orders addressed:  1. De Quervain's disease (tenosynovitis) Rest Ice Wrist splint Referral to Ortho Start diclofenac bid with food, no other NSAID's Keep follow up with PCP - Ambulatory referral to Hand Surgery - diclofenac (VOLTAREN) 75 MG EC tablet; Take 1 tablet (75 mg total) by mouth 2 (two) times daily.  Dispense: 30 tablet; Refill: 0  Evelina Dun, FNP

## 2022-04-14 NOTE — Patient Instructions (Signed)
De Quervain's Tenosynovitis  De Quervain's tenosynovitis is a condition that causes inflammation of the tendon on the thumb side of the wrist. Tendons are cords of tissue that connect bones to muscles. The tendons in the hand pass through a tunnel called a sheath. A slippery layer of tissue (synovium) lets the tendons move smoothly in the sheath. With de Quervain's tenosynovitis, the sheath swells or thickens, causing friction and pain. The condition is also called de Quervain's disease and de Quervain's syndrome. It occurs most often in women who are 58-35 years old. What are the causes? The exact cause of this condition is not known. It may be associated with overuse of the hand and wrist. What increases the risk? You are more likely to develop this condition if you: Use your hands far more than normal, especially if you repeat certain movements that involve twisting your hand or using a tight grip. Are pregnant. Are a middle-aged woman. Have rheumatoid arthritis. Have diabetes. What are the signs or symptoms? The main symptom of this condition is pain on the thumb side of the wrist. The pain may get worse when you grasp something or turn your wrist. Other symptoms may include: Pain that extends up the forearm. Swelling of your wrist and hand. Trouble moving the thumb and wrist. A sensation of snapping in the wrist. A bump filled with fluid (cyst) in the area of the pain. How is this diagnosed? This condition may be diagnosed based on: Your symptoms and medical history. A physical exam. During the exam, your health care provider may do a simple test Wynn Maudlin test) that involves pulling your thumb and wrist to see if this causes pain. You may also need to have an X-ray or ultrasound. How is this treated? Treatment for this condition may include: Avoiding any activity that causes pain and swelling. Taking medicines. Anti-inflammatory medicines and corticosteroid injections may be used  to reduce inflammation and relieve pain. Wearing a splint. Having surgery. This may be needed if other treatments do not work. Once the pain and swelling have gone down, you may start: Physical therapy. This includes exercises to improve movement and strength in your wrist and thumb. Occupational therapy. This includes adjusting how you move your wrist. Follow these instructions at home: If you have a splint: Wear the splint as told by your health care provider. Remove it only as told by your health care provider. Loosen the splint if your fingers tingle, become numb, or turn cold and blue. Keep the splint clean. If the splint is not waterproof: Do not let it get wet. Cover it with a watertight covering when you take a bath or a shower. Managing pain, stiffness, and swelling  Avoid movements and activities that cause pain and swelling in the wrist area. If directed, put ice on the painful area. This may be helpful after doing activities that involve the sore wrist. To do this: Put ice in a plastic bag. Place a towel between your skin and the bag. Leave the ice on for 20 minutes, 2-3 times a day. Remove the ice if your skin turns bright red. This is very important. If you cannot feel pain, heat, or cold, you have a greater risk of damage to the area. Move your fingers often to reduce stiffness and swelling. Raise (elevate) the injured area above the level of your heart while you are sitting or lying down. General instructions Return to your normal activities as told by your health care provider. Ask your  health care provider what activities are safe for you. Take over-the-counter and prescription medicines only as told by your health care provider. Keep all follow-up visits. This is important. Contact a health care provider if: Your pain medicine does not help. Your pain gets worse. You develop new symptoms. Summary De Quervain's tenosynovitis is a condition that causes inflammation  of the tendon on the thumb side of the wrist. The condition occurs most often in women who are 30-50 years old. The exact cause of this condition is not known. It may be associated with overuse of the hand and wrist. Treatment starts with avoiding activity that causes pain or swelling in the wrist area. Other treatments may include wearing a splint and taking medicine. Sometimes, surgery is needed. This information is not intended to replace advice given to you by your health care provider. Make sure you discuss any questions you have with your health care provider. Document Revised: 06/08/2019 Document Reviewed: 06/08/2019 Elsevier Patient Education  2023 [employer]  

## 2022-04-15 DIAGNOSIS — K080 Exfoliation of teeth due to systemic causes: Secondary | ICD-10-CM | POA: Diagnosis not present

## 2022-04-21 DIAGNOSIS — M654 Radial styloid tenosynovitis [de Quervain]: Secondary | ICD-10-CM | POA: Diagnosis not present

## 2022-05-09 ENCOUNTER — Other Ambulatory Visit: Payer: Self-pay | Admitting: Family Medicine

## 2022-05-09 DIAGNOSIS — E782 Mixed hyperlipidemia: Secondary | ICD-10-CM

## 2022-05-09 DIAGNOSIS — Z8673 Personal history of transient ischemic attack (TIA), and cerebral infarction without residual deficits: Secondary | ICD-10-CM

## 2022-05-09 DIAGNOSIS — E1169 Type 2 diabetes mellitus with other specified complication: Secondary | ICD-10-CM

## 2022-05-14 ENCOUNTER — Ambulatory Visit (INDEPENDENT_AMBULATORY_CARE_PROVIDER_SITE_OTHER): Payer: Medicare Other | Admitting: Family Medicine

## 2022-05-14 ENCOUNTER — Encounter: Payer: Self-pay | Admitting: Family Medicine

## 2022-05-14 VITALS — BP 120/63 | HR 58 | Ht 64.0 in | Wt 190.0 lb

## 2022-05-14 DIAGNOSIS — M1A0420 Idiopathic chronic gout, left hand, without tophus (tophi): Secondary | ICD-10-CM | POA: Diagnosis not present

## 2022-05-14 DIAGNOSIS — E1169 Type 2 diabetes mellitus with other specified complication: Secondary | ICD-10-CM | POA: Diagnosis not present

## 2022-05-14 DIAGNOSIS — R7303 Prediabetes: Secondary | ICD-10-CM | POA: Diagnosis not present

## 2022-05-14 DIAGNOSIS — M654 Radial styloid tenosynovitis [de Quervain]: Secondary | ICD-10-CM

## 2022-05-14 DIAGNOSIS — M62838 Other muscle spasm: Secondary | ICD-10-CM | POA: Diagnosis not present

## 2022-05-14 DIAGNOSIS — E782 Mixed hyperlipidemia: Secondary | ICD-10-CM

## 2022-05-14 DIAGNOSIS — I10 Essential (primary) hypertension: Secondary | ICD-10-CM | POA: Diagnosis not present

## 2022-05-14 DIAGNOSIS — F411 Generalized anxiety disorder: Secondary | ICD-10-CM

## 2022-05-14 DIAGNOSIS — F3341 Major depressive disorder, recurrent, in partial remission: Secondary | ICD-10-CM

## 2022-05-14 LAB — BAYER DCA HB A1C WAIVED: HB A1C (BAYER DCA - WAIVED): 5.9 % — ABNORMAL HIGH (ref 4.8–5.6)

## 2022-05-14 MED ORDER — BACLOFEN 10 MG PO TABS: 10.0000 mg | ORAL_TABLET | Freq: Three times a day (TID) | ORAL | 3 refills | Status: DC

## 2022-05-14 MED ORDER — AMLODIPINE BESYLATE 10 MG PO TABS: 10.0000 mg | ORAL_TABLET | Freq: Every day | ORAL | 3 refills | Status: DC

## 2022-05-14 MED ORDER — CARVEDILOL 6.25 MG PO TABS: 6.2500 mg | ORAL_TABLET | Freq: Two times a day (BID) | ORAL | 3 refills | Status: DC

## 2022-05-14 MED ORDER — SERTRALINE HCL 50 MG PO TABS: 50.0000 mg | ORAL_TABLET | Freq: Every day | ORAL | 3 refills | Status: DC

## 2022-05-14 MED ORDER — ALLOPURINOL 300 MG PO TABS: 300.0000 mg | ORAL_TABLET | Freq: Every day | ORAL | 3 refills | Status: DC

## 2022-05-14 MED ORDER — DICLOFENAC SODIUM 75 MG PO TBEC: 75.0000 mg | DELAYED_RELEASE_TABLET | Freq: Two times a day (BID) | ORAL | 3 refills | Status: DC

## 2022-05-14 NOTE — Progress Notes (Addendum)
BP 120/63   Pulse (!) 58   Ht 5\' 4"  (1.626 m)   Wt 190 lb (86.2 kg)   SpO2 98%   BMI 32.61 kg/m    Subjective:   Patient ID: Bruce Guerrero, male    DOB: Dec 17, 1960, 63 y.o.   MRN: [id number]  HPI: Bruce Guerrero is a 62 y.o. male presenting on 05/14/2022 for Medical Management of Chronic Issues, Hyperlipidemia, Hypertension, and Prediabetes  Prediabetes Patient comes in today for recheck of their prediabetes. Patient's does not currently take any medications. Patient is not currently taking an ACE inhibitor/ARB. Patient has not seen an ophthalmologist this year. Patient does not have a podiatrist. Patient denies any issues with their feet. The symptom started onset as an adult. Hypertension and Hyperlipidemia are related to prediabetes.  Hypertension Patient is coming in for a recheck of their hypertension. Patient is currently taking Amlodipine and Carvedilol. Patient's blood pressure today is 120/63 mmHg. Patient has been taking their blood pressure at home with similar readings, a few systolic numbers in the 0000000. Patient denies lightheadedness, dizziness, weakness, headaches, blurred vision, chest pains, and shortness of breath. Patient denies any side effects from medication(s) and is content with current medication(s).   Hyperlipidemia Patient is coming in for recheck of his hyperlipidemia. The patient is currently taking Atorvastatin Calcium (Lipitor). They deny any issues with myalgias or history of liver damage from it. They deny any focal numbness or weakness or chest pain.   Patient is also having recurring complaints of left wrist pain on the lateral aspect of his wrist just proximal to his thumb with prior diagnosis of DeQuervain's Tenosynovitis. Patient does not have pain with thumb opposition. Patient states this pain has been bothering him since November and he has used Diclofenac tablets 75 mg PO BID and Aleve 400 mg PO qd that do provide relief. Patient is asking  for a refill of the Diclofenac tablets. Patient has received a steroid injection from his orthopedist in the past. Patient is trying to avoid tendon release if possible.  Relevant past medical, surgical, family and social history reviewed and updated as indicated. Interim medical history since our last visit reviewed. Allergies and medications reviewed and updated.  Review of Systems  Respiratory:  Negative for chest tightness and shortness of breath.   Cardiovascular:  Negative for chest pain and leg swelling.  Gastrointestinal:  Negative for abdominal pain.  Musculoskeletal:  Positive for arthralgias. Negative for back pain.  Skin:  Negative for color change.  Neurological:  Negative for dizziness, weakness, light-headedness, numbness and headaches.  All other systems reviewed and are negative.   Per HPI unless specifically indicated above   Allergies as of 05/14/2022   No Known Allergies      Medication List        Accurate as of May 14, 2022 11:59 PM. If you have any questions, ask your nurse or doctor.          allopurinol 300 MG tablet Commonly known as: ZYLOPRIM Take 1 tablet (300 mg total) by mouth daily.   amLODipine 10 MG tablet Commonly known as: NORVASC Take 1 tablet (10 mg total) by mouth daily.   atorvastatin 80 MG tablet Commonly known as: LIPITOR TAKE 1 TABLET BY MOUTH EVERY DAY AT 6 PM   baclofen 10 MG tablet Commonly known as: LIORESAL Take 1 tablet (10 mg total) by mouth 3 (three) times daily. What changed:  when to take this reasons to take this  carvedilol 6.25 MG tablet Commonly known as: COREG Take 1 tablet (6.25 mg total) by mouth 2 (two) times daily with a meal.   Cinnamon 500 MG capsule Take 1,000 mg by mouth daily.   clopidogrel 75 MG tablet Commonly known as: PLAVIX TAKE 1 TABLET BY MOUTH EVERY DAY   colchicine 0.6 MG tablet TAKE 1 TABLET BY MOUTH EVERY DAY   diclofenac 75 MG EC tablet Commonly known as: VOLTAREN Take 1  tablet (75 mg total) by mouth 2 (two) times daily.   ferrous sulfate 325 (65 FE) MG tablet Take 325 mg by mouth daily with breakfast.   sertraline 50 MG tablet Commonly known as: ZOLOFT Take 1 tablet (50 mg total) by mouth daily.   terbinafine 250 MG tablet Commonly known as: LAMISIL TAKE 1 TABLET BY MOUTH EVERY DAY         Objective:   BP 120/63   Pulse (!) 58   Ht 5\' 4"  (1.626 m)   Wt 190 lb (86.2 kg)   SpO2 98%   BMI 32.61 kg/m   Wt Readings from Last 3 Encounters:  05/14/22 190 lb (86.2 kg)  04/14/22 191 lb 12.8 oz (87 kg)  04/10/22 194 lb (88 kg)    Physical Exam Constitutional:      Appearance: Normal appearance.  HENT:     Head: Normocephalic.  Neck:     Thyroid: No thyromegaly.  Cardiovascular:     Rate and Rhythm: Normal rate and regular rhythm.     Pulses: Normal pulses.     Heart sounds: Normal heart sounds.  Pulmonary:     Effort: Pulmonary effort is normal.     Breath sounds: Normal breath sounds. No wheezing, rhonchi or rales.  Abdominal:     Tenderness: There is no right CVA tenderness or left CVA tenderness.  Musculoskeletal:       Arms:     Cervical back: Neck supple. No tenderness.     Right lower leg: No edema.     Left lower leg: No edema.     Comments: Pain and tender to palpation on lateral aspect of Left wrist  Lymphadenopathy:     Cervical: No cervical adenopathy.  Skin:    General: Skin is warm and dry.  Neurological:     Mental Status: He is alert.     Sensory: No sensory deficit.     Gait: Gait abnormal (from prior stroke).  Psychiatric:        Mood and Affect: Mood normal.        Behavior: Behavior normal.       Assessment & Plan:   Problem List Items Addressed This Visit       Cardiovascular and Mediastinum   Essential hypertension   Relevant Medications   amLODipine (NORVASC) 10 MG tablet   carvedilol (COREG) 6.25 MG tablet     Other   Hyperlipidemia   Relevant Medications   amLODipine (NORVASC) 10 MG  tablet   carvedilol (COREG) 6.25 MG tablet   Prediabetes - Primary   Relevant Orders   BMP8+EGFR (Completed)   Bayer DCA Hb A1c Waived (Completed)   Gout   Relevant Medications   allopurinol (ZYLOPRIM) 300 MG tablet   Depression   Relevant Medications   sertraline (ZOLOFT) 50 MG tablet   GAD (generalized anxiety disorder)   Relevant Medications   sertraline (ZOLOFT) 50 MG tablet   Other Visit Diagnoses     Muscle spasm       Relevant  Medications   baclofen (LIORESAL) 10 MG tablet   De Quervain's disease (tenosynovitis)       Relevant Medications   diclofenac (VOLTAREN) 75 MG EC tablet      Patient will receive a refill of Diclofenac pills for DeQuervain's Tenosynovitis.  HgbA1c is 5.9 today. Continue diet control only for prediabetes. Patient will make an eye appointment and keep a check on his feet.   Continue wearing compression stockings daily and continue all other medications as prescribed.  Follow up plan: Return in about 3 months (around 08/14/2022), or if symptoms worsen or fail to improve, for Diabetes hypertension cholesterol recheck.  Counseling provided for all of the vaccine components Orders Placed This Encounter  Procedures   BMP8+EGFR   [facility] Hb A1c [address], [place] 05/14/2022, 9:05 AM  I was personally present for all components of the history, physical exam and/or medical decision making.  I agree with the documentation performed by the PA student and agree with assessment and plan above.  PA student  Caryl Pina, MD [facility] 05/30/2022, 8:06 AM

## 2022-05-15 LAB — BMP8+EGFR
BUN/Creatinine Ratio: 16 (ref 10–24)
BUN: 21 mg/dL (ref 8–27)
CO2: 23 mmol/L (ref 20–29)
Calcium: 9.3 mg/dL (ref 8.6–10.2)
Chloride: 103 mmol/L (ref 96–106)
Creatinine, Ser: 1.35 mg/dL — ABNORMAL HIGH (ref 0.76–1.27)
Glucose: 148 mg/dL — ABNORMAL HIGH (ref 70–99)
Potassium: 4.4 mmol/L (ref 3.5–5.2)
Sodium: 139 mmol/L (ref 134–144)
eGFR: 60 mL/min/{1.73_m2} (ref >59)

## 2022-07-02 DIAGNOSIS — Z85828 Personal history of other malignant neoplasm of skin: Secondary | ICD-10-CM | POA: Diagnosis not present

## 2022-07-02 DIAGNOSIS — L570 Actinic keratosis: Secondary | ICD-10-CM | POA: Diagnosis not present

## 2022-07-08 DIAGNOSIS — Z0289 Encounter for other administrative examinations: Secondary | ICD-10-CM

## 2022-07-21 DIAGNOSIS — M654 Radial styloid tenosynovitis [de Quervain]: Secondary | ICD-10-CM | POA: Diagnosis not present

## 2022-08-18 ENCOUNTER — Ambulatory Visit: Payer: Medicare Other

## 2022-08-23 ENCOUNTER — Other Ambulatory Visit: Payer: Self-pay | Admitting: Family Medicine

## 2022-08-23 DIAGNOSIS — M1A0420 Idiopathic chronic gout, left hand, without tophus (tophi): Secondary | ICD-10-CM

## 2022-08-27 ENCOUNTER — Other Ambulatory Visit: Payer: Self-pay | Admitting: Family Medicine

## 2022-08-27 DIAGNOSIS — B351 Tinea unguium: Secondary | ICD-10-CM

## 2022-09-08 DIAGNOSIS — F3342 Major depressive disorder, recurrent, in full remission: Secondary | ICD-10-CM | POA: Diagnosis not present

## 2022-09-15 LAB — LAB REPORT - SCANNED
Albumin/Creatinine Ratio, Urine, POC: 77
EGFR: 75

## 2022-09-24 ENCOUNTER — Ambulatory Visit (INDEPENDENT_AMBULATORY_CARE_PROVIDER_SITE_OTHER): Payer: Medicare Other

## 2022-09-24 ENCOUNTER — Encounter: Payer: Self-pay | Admitting: Family Medicine

## 2022-09-24 ENCOUNTER — Ambulatory Visit (INDEPENDENT_AMBULATORY_CARE_PROVIDER_SITE_OTHER): Payer: Medicare Other | Admitting: Family Medicine

## 2022-09-24 VITALS — BP 127/66 | HR 52 | Ht 64.0 in | Wt 189.0 lb

## 2022-09-24 VITALS — BP 127/66 | Ht 64.0 in | Wt 189.0 lb

## 2022-09-24 DIAGNOSIS — R7303 Prediabetes: Secondary | ICD-10-CM | POA: Diagnosis not present

## 2022-09-24 DIAGNOSIS — I10 Essential (primary) hypertension: Secondary | ICD-10-CM | POA: Diagnosis not present

## 2022-09-24 DIAGNOSIS — N183 Chronic kidney disease, stage 3 unspecified: Secondary | ICD-10-CM

## 2022-09-24 DIAGNOSIS — Z0000 Encounter for general adult medical examination without abnormal findings: Secondary | ICD-10-CM

## 2022-09-24 DIAGNOSIS — I129 Hypertensive chronic kidney disease with stage 1 through stage 4 chronic kidney disease, or unspecified chronic kidney disease: Secondary | ICD-10-CM

## 2022-09-24 DIAGNOSIS — E782 Mixed hyperlipidemia: Secondary | ICD-10-CM | POA: Diagnosis not present

## 2022-09-24 DIAGNOSIS — N1831 Chronic kidney disease, stage 3a: Secondary | ICD-10-CM

## 2022-09-24 LAB — BAYER DCA HB A1C WAIVED: HB A1C (BAYER DCA - WAIVED): 5.9 % — ABNORMAL HIGH (ref 4.8–5.6)

## 2022-09-24 NOTE — Patient Instructions (Signed)
Bruce Guerrero , Thank you for taking time to come for your Medicare Wellness Visit. I appreciate your ongoing commitment to your health goals. Please review the following plan we discussed and let me know if I can assist you in the future.   These are the goals we discussed:  Goals       Patient Stated      09/19/21 - hopes to stay active and independent      Patient Stated (pt-stated)      Patients goal for the up coming year is to lose weight        This is a list of the screening recommended for you and due dates:  Health Maintenance  Topic Date Due   COVID-19 Vaccine (7 - 2023-24 season) 02/24/2022   Eye exam for diabetics  07/25/2022   Yearly kidney health urinalysis for diabetes  08/02/2022   Flu Shot  10/09/2022   Hemoglobin A1C  11/14/2022   Complete foot exam   02/11/2023   Yearly kidney function blood test for diabetes  05/14/2023   Medicare Annual Wellness Visit  09/24/2023   DTaP/Tdap/Td vaccine (2 - Td or Tdap) 10/11/2023   Colon Cancer Screening  05/10/2026   Hepatitis C Screening  Completed   Zoster (Shingles) Vaccine  Completed   HPV Vaccine  Aged Out   HIV Screening  Discontinued    Advanced directives: Advance directive discussed with you today. Even though you declined this today, please call our office should you change your mind, and we can give you the proper paperwork for you to fill out. Advance care planning is a way to make decisions about medical care that fits your values in case you are ever unable to make these decisions for yourself.  Information on Advanced Care Planning can be found at [facility] Advance Health Care Directives Advance Health Care Directives ([email or web address]   Conditions/risks identified:  Advance directive discussed with you today. Even though you declined this today, please call our office should you change your mind, and we can give you the proper paperwork for you to fill out. Advance care planning is a way to  make decisions about medical care that fits your values in case you are ever unable to make these decisions for yourself.  Information on Advanced Care Planning can be found at [facility] [place] Advance Health Care Directives Advance Health Care Directives ([email or web address]   Aim for 30 minutes of exercise or brisk walking, 6-8 glasses of water, and 5 servings of fruits and vegetables each day.   Next appointment: VIRTUAL/ TELEPHONE VISIT Follow up in one year for your annual wellness visit September 25, 2023 @ 8:30 am TELEPHONE VISIT   Preventive Care 45-64 Years, Male Preventive care refers to lifestyle choices and visits with your health care provider that can promote health and wellness. What does preventive care include? A yearly physical exam. This is also called an annual well check. Dental exams once or twice a year. Routine eye exams. Ask your health care provider how often you should have your eyes checked. Personal lifestyle choices, including: Daily care of your teeth and gums. Regular physical activity. Eating a healthy diet. Avoiding tobacco and drug use. Limiting alcohol use. Practicing safe sex. Taking low-dose aspirin every day starting at age 33. What happens during an annual well check? The services and screenings done by your health care provider during your annual well check will depend on your age, overall health,  lifestyle risk factors, and family history of disease. Counseling  Your health care provider may ask you questions about your: Alcohol use. Tobacco use. Drug use. Emotional well-being. Home and relationship well-being. Sexual activity. Eating habits. Work and work Astronomer. Screening  You may have the following tests or measurements: Height, weight, and BMI. Blood pressure. Lipid and cholesterol levels. These may be checked every 5 years, or more frequently if you are over 10 years old. Skin check. Lung cancer screening. You may have  this screening every year starting at age 59 if you have a 30-pack-year history of smoking and currently smoke or have quit within the past 15 years. Fecal occult blood test (FOBT) of the stool. You may have this test every year starting at age 67. Flexible sigmoidoscopy or colonoscopy. You may have a sigmoidoscopy every 5 years or a colonoscopy every 10 years starting at age 51. Prostate cancer screening. Recommendations will vary depending on your family history and other risks. Hepatitis C blood test. Hepatitis B blood test. Sexually transmitted disease (STD) testing. Diabetes screening. This is done by checking your blood sugar (glucose) after you have not eaten for a while (fasting). You may have this done every 1-3 years. Discuss your test results, treatment options, and if necessary, the need for more tests with your health care provider. Vaccines  Your health care provider may recommend certain vaccines, such as: Influenza vaccine. This is recommended every year. Tetanus, diphtheria, and acellular pertussis (Tdap, Td) vaccine. You may need a Td booster every 10 years. Zoster vaccine. You may need this after age 18. Pneumococcal 13-valent conjugate (PCV13) vaccine. You may need this if you have certain conditions and have not been vaccinated. Pneumococcal polysaccharide (PPSV23) vaccine. You may need one or two doses if you smoke cigarettes or if you have certain conditions. Talk to your health care provider about which screenings and vaccines you need and how often you need them. This information is not intended to replace advice given to you by your health care provider. Make sure you discuss any questions you have with your health care provider. Document Released: 03/23/2015 Document Revised: 11/14/2015 Document Reviewed: 12/26/2014 Elsevier Interactive Patient Education  2017 [employer].  Fall Prevention in the Home Falls can cause injuries. They can happen to people of all ages.  There are many things you can do to make your home safe and to help prevent falls. What can I do on the outside of my home? Regularly fix the edges of walkways and driveways and fix any cracks. Remove anything that might make you trip as you walk through a door, such as a raised step or threshold. Trim any bushes or trees on the path to your home. Use bright outdoor lighting. Clear any walking paths of anything that might make someone trip, such as rocks or tools. Regularly check to see if handrails are loose or broken. Make sure that both sides of any steps have handrails. Any raised decks and porches should have guardrails on the edges. Have any leaves, snow, or ice cleared regularly. Use sand or salt on walking paths during winter. Clean up any spills in your garage right away. This includes oil or grease spills. What can I do in the bathroom? Use night lights. Install grab bars by the toilet and in the tub and shower. Do not use towel bars as grab bars. Use non-skid mats or decals in the tub or shower. If you need to sit down in the shower, use  a plastic, non-slip stool. Keep the floor dry. Clean up any water that spills on the floor as soon as it happens. Remove soap buildup in the tub or shower regularly. Attach bath mats securely with double-sided non-slip rug tape. Do not have throw rugs and other things on the floor that can make you trip. What can I do in the bedroom? Use night lights. Make sure that you have a light by your bed that is easy to reach. Do not use any sheets or blankets that are too big for your bed. They should not hang down onto the floor. Have a firm chair that has side arms. You can use this for support while you get dressed. Do not have throw rugs and other things on the floor that can make you trip. What can I do in the kitchen? Clean up any spills right away. Avoid walking on wet floors. Keep items that you use a lot in easy-to-reach places. If you need  to reach something above you, use a strong step stool that has a grab bar. Keep electrical cords out of the way. Do not use floor polish or wax that makes floors slippery. If you must use wax, use non-skid floor wax. Do not have throw rugs and other things on the floor that can make you trip. What can I do with my stairs? Do not leave any items on the stairs. Make sure that there are handrails on both sides of the stairs and use them. Fix handrails that are broken or loose. Make sure that handrails are as long as the stairways. Check any carpeting to make sure that it is firmly attached to the stairs. Fix any carpet that is loose or worn. Avoid having throw rugs at the top or bottom of the stairs. If you do have throw rugs, attach them to the floor with carpet tape. Make sure that you have a light switch at the top of the stairs and the bottom of the stairs. If you do not have them, ask someone to add them for you. What else can I do to help prevent falls? Wear shoes that: Do not have high heels. Have rubber bottoms. Are comfortable and fit you well. Are closed at the toe. Do not wear sandals. If you use a stepladder: Make sure that it is fully opened. Do not climb a closed stepladder. Make sure that both sides of the stepladder are locked into place. Ask someone to hold it for you, if possible. Clearly mark and make sure that you can see: Any grab bars or handrails. First and last steps. Where the edge of each step is. Use tools that help you move around (mobility aids) if they are needed. These include: Canes. Walkers. Scooters. Crutches. Turn on the lights when you go into a dark area. Replace any light bulbs as soon as they burn out. Set up your furniture so you have a clear path. Avoid moving your furniture around. If any of your floors are uneven, fix them. If there are any pets around you, be aware of where they are. Review your medicines with your doctor. Some medicines can  make you feel dizzy. This can increase your chance of falling. Ask your doctor what other things that you can do to help prevent falls. This information is not intended to replace advice given to you by your health care provider. Make sure you discuss any questions you have with your health care provider. Document Released: 12/21/2008 Document Revised:  08/02/2015 Document Reviewed: 03/31/2014 Elsevier Interactive Patient Education  2017 [employer].

## 2022-09-24 NOTE — Progress Notes (Signed)
BP 127/66   Pulse (!) 52   Ht 5\' 4"  (1.626 m)   Wt 189 lb (85.7 kg)   SpO2 98%   BMI 32.44 kg/m    Subjective:   Patient ID: Bruce Guerrero, male    DOB: 02-Feb-1961, 62 y.o.   MRN: [id number]  HPI: Bruce Guerrero is a 62 y.o. male presenting on 09/24/2022 for Medical Management of Chronic Issues, Hyperlipidemia, Hypertension, and Prediabetes (Nurse screened pt during home visit and pts microalbumin was elevated- recked today in office)   HPI Hypertension Patient is currently on amlodipine and carvedilol, and their blood pressure today is 127/66. Patient denies any lightheadedness or dizziness. Patient denies headaches, blurred vision, chest pains, shortness of breath, or weakness. Denies any side effects from medication and is content with current medication.   Prediabetes Patient comes in today for recheck of his diabetes. Patient has been currently taking no medication, diet control. Patient is not currently on an ACE inhibitor/ARB. Patient has not seen an ophthalmologist this year. Patient denies any new issues with their feet. The symptom started onset as an adult hypertension and hyperlipidemia and CKD 3 ARE RELATED TO DM   Hyperlipidemia Patient is coming in for recheck of his hyperlipidemia. The patient is currently taking atorvastatin. They deny any issues with myalgias or history of liver damage from it. They deny any focal numbness or weakness or chest pain.   Relevant past medical, surgical, family and social history reviewed and updated as indicated. Interim medical history since our last visit reviewed. Allergies and medications reviewed and updated.  Review of Systems  Constitutional:  Negative for chills and fever.  Eyes:  Negative for visual disturbance.  Respiratory:  Negative for shortness of breath and wheezing.   Cardiovascular:  Negative for chest pain and leg swelling.  Skin:  Negative for rash.  All other systems reviewed and are negative.   Per  HPI unless specifically indicated above   Allergies as of 09/24/2022   No Known Allergies      Medication List        Accurate as of September 24, 2022  9:47 AM. If you have any questions, ask your nurse or doctor.          allopurinol 300 MG tablet Commonly known as: ZYLOPRIM Take 1 tablet (300 mg total) by mouth daily.   amLODipine 10 MG tablet Commonly known as: NORVASC Take 1 tablet (10 mg total) by mouth daily.   atorvastatin 80 MG tablet Commonly known as: LIPITOR TAKE 1 TABLET BY MOUTH EVERY DAY AT 6 PM   baclofen 10 MG tablet Commonly known as: LIORESAL Take 1 tablet (10 mg total) by mouth 3 (three) times daily.   carvedilol 6.25 MG tablet Commonly known as: COREG Take 1 tablet (6.25 mg total) by mouth 2 (two) times daily with a meal.   Cinnamon 500 MG capsule Take 1,000 mg by mouth daily.   clopidogrel 75 MG tablet Commonly known as: PLAVIX TAKE 1 TABLET BY MOUTH EVERY DAY   colchicine 0.6 MG tablet TAKE 1 TABLET BY MOUTH EVERY DAY   diclofenac 75 MG EC tablet Commonly known as: VOLTAREN Take 1 tablet (75 mg total) by mouth 2 (two) times daily.   ferrous sulfate 325 (65 FE) MG tablet Take 325 mg by mouth daily with breakfast.   sertraline 50 MG tablet Commonly known as: ZOLOFT Take 1 tablet (50 mg total) by mouth daily.   terbinafine 250 MG tablet Commonly known  as: LAMISIL TAKE 1 TABLET BY MOUTH EVERY DAY         Objective:   BP 127/66   Pulse (!) 52   Ht 5\' 4"  (1.626 m)   Wt 189 lb (85.7 kg)   SpO2 98%   BMI 32.44 kg/m   Wt Readings from Last 3 Encounters:  09/24/22 189 lb (85.7 kg)  09/24/22 189 lb (85.7 kg)  05/14/22 190 lb (86.2 kg)    Physical Exam Vitals and nursing note reviewed.  Constitutional:      General: He is not in acute distress.    Appearance: He is well-developed. He is not diaphoretic.  Eyes:     General: No scleral icterus.    Conjunctiva/sclera: Conjunctivae normal.  Neck:     Thyroid: No thyromegaly.   Cardiovascular:     Rate and Rhythm: Normal rate and regular rhythm.     Heart sounds: Normal heart sounds. No murmur heard. Pulmonary:     Effort: Pulmonary effort is normal. No respiratory distress.     Breath sounds: Normal breath sounds. No wheezing.  Musculoskeletal:        General: No swelling.     Cervical back: Neck supple.  Lymphadenopathy:     Cervical: No cervical adenopathy.  Skin:    General: Skin is warm and dry.  Neurological:     Mental Status: He is alert and oriented to person, place, and time.  Psychiatric:        Behavior: Behavior normal.    You could ask if he was aware wants to go   Assessment & Plan:   Problem List Items Addressed This Visit       Cardiovascular and Mediastinum   Essential hypertension   Relevant Orders   CBC with Differential/Platelet   CMP14+EGFR   Lipid panel     Genitourinary   CKD (chronic kidney disease) stage 3, GFR 30-59 ml/min (HCC)     Other   Hyperlipidemia   Relevant Orders   CBC with Differential/Platelet   CMP14+EGFR   Lipid panel   Prediabetes - Primary   Relevant Orders   CBC with Differential/Platelet   CMP14+EGFR   Lipid panel   Microalbumin / creatinine urine ratio   Bayer DCA Hb A1c Waived  Blood pressure looks good, blood work looks good as well. A1c is 5.9  Follow up plan: Return in about 3 months (around 12/25/2022), or if symptoms worsen or fail to improve, for Prediabetes and hypertension and hyperlipidemia.  Counseling provided for all of the vaccine components Orders Placed This Encounter  Procedures   CBC with Differential/Platelet   CMP14+EGFR   Lipid panel   Microalbumin / creatinine urine ratio   Bayer DCA Hb A1c Waived    Arville Care, MD [place] [facility] 09/24/2022, 9:47 AM

## 2022-09-24 NOTE — Progress Notes (Signed)
 Because this visit was a virtual/telehealth visit,  certain criteria was not obtained, such a blood pressure, CBG if patient is a diabetic, and timed up and go. Any medications not marked as "taking" was not mentioned during the medication reconciliation part of the visit. Any vitals not documented were not able to be obtained due to this being a telehealth visit. Vitals documented are verbally provided by the patient.   Subjective:   Bruce Guerrero is a 62 y.o. male who presents for Medicare Annual/Subsequent preventive examination.  Visit Complete: Virtual  I connected with  [name] on 09/24/22 by a audio enabled telemedicine application and verified that I am speaking with the correct person using two identifiers.  Patient Location: Home  Provider Location: Home Office  I discussed the limitations of evaluation and management by telemedicine. The patient expressed understanding and agreed to proceed.  Patient Medicare AWV questionnaire was completed by the patient on 09/23/22; I have confirmed that all information answered by patient is correct and no changes since this date.  Review of Systems     Cardiac Risk Factors include: advanced age (>62men, >4 women);dyslipidemia;hypertension;male gender;obesity (BMI >30kg/m2)     Objective:    Today's Vitals   09/24/22 0937  BP: 127/66  Weight: 189 lb (85.7 kg)  Height: 5\' 4"  (1.626 m)   Body mass index is 32.44 kg/m.     09/24/2022    9:39 AM 03/12/2022    8:29 AM 09/19/2021    8:23 AM 09/18/2020    8:31 AM 08/30/2019    2:27 PM 02/18/2016    7:29 AM 04/20/2015   11:27 PM  Advanced Directives  Does Patient Have a Medical Advance Directive? No No No No Yes;No No No  Does patient want to make changes to medical advance directive?     No - Patient declined    Would patient like information on creating a medical advance directive? No - Patient declined No - Patient declined No - Patient declined No - Patient declined  No - Patient declined  No - patient declined information    Current Medications (verified) Outpatient Encounter Medications as of 09/24/2022  Medication Sig   allopurinol (ZYLOPRIM) 300 MG tablet Take 1 tablet (300 mg total) by mouth daily.   amLODipine (NORVASC) 10 MG tablet Take 1 tablet (10 mg total) by mouth daily.   atorvastatin (LIPITOR) 80 MG tablet TAKE 1 TABLET BY MOUTH EVERY DAY AT 6 PM   baclofen (LIORESAL) 10 MG tablet Take 1 tablet (10 mg total) by mouth 3 (three) times daily.   carvedilol (COREG) 6.25 MG tablet Take 1 tablet (6.25 mg total) by mouth 2 (two) times daily with a meal.   Cinnamon 500 MG capsule Take 1,000 mg by mouth daily.   clopidogrel (PLAVIX) 75 MG tablet TAKE 1 TABLET BY MOUTH EVERY DAY   colchicine 0.6 MG tablet TAKE 1 TABLET BY MOUTH EVERY DAY   diclofenac (VOLTAREN) 75 MG EC tablet Take 1 tablet (75 mg total) by mouth 2 (two) times daily.   ferrous sulfate 325 (65 FE) MG tablet Take 325 mg by mouth daily with breakfast.   sertraline (ZOLOFT) 50 MG tablet Take 1 tablet (50 mg total) by mouth daily.   terbinafine (LAMISIL) 250 MG tablet TAKE 1 TABLET BY MOUTH EVERY DAY   No facility-administered encounter medications on file as of 09/24/2022.    Allergies (verified) Patient has no known allergies.   History: Past Medical History:  Diagnosis  Date   Gout    Hyperlipidemia    Hypertension    Stroke [facility]) 2014   Past Surgical History:  Procedure Laterality Date   HERNIA REPAIR  03/14/2022   NO PAST SURGERIES     XI ROBOTIC ASSISTED VENTRAL HERNIA N/A 03/14/2022   Procedure: XI ROBOTIC ASSISTED VENTRAL HERNIA W/ MESH;  Surgeon: Lucretia Roers, MD;  Location: [facility];  Service: General;  Laterality: N/A;   Family History  Problem Relation Age of Onset   COPD Mother    Arthritis Mother    COPD Father    Cancer Sister    Colon cancer Sister        late 64's   Hyperlipidemia Brother    Diabetes Brother    Social History   Socioeconomic  History   Marital status: Divorced    Spouse name: Not on file   Number of children: 1   Years of education: Not on file   Highest education level: Associate degree: occupational, Scientist, product/process development, or vocational program  Occupational History   Occupation: retired/ disabilty  Tobacco Use   Smoking status: Never   Smokeless tobacco: Never  Vaping Use   Vaping status: Never Used  Substance and Sexual Activity   Alcohol use: No   Drug use: No   Sexual activity: Not Currently  Other Topics Concern   Not on file  Social History Narrative   One son lives at home with him. Other son lives in [place]   Social Determinants of Health   Financial Resource Strain: Medium Risk (09/24/2022)   Overall Financial Resource Strain (CARDIA)    Difficulty of Paying Living Expenses: Somewhat hard  Food Insecurity: No Food Insecurity (09/24/2022)   Hunger Vital Sign    Worried About Running Out of Food in the Last Year: Never true    Ran Out of Food in the Last Year: Never true  Transportation Needs: No Transportation Needs (09/24/2022)   PRAPARE - Administrator, Civil Service (Medical): No    Lack of Transportation (Non-Medical): No  Physical Activity: Sufficiently Active (09/24/2022)   Exercise Vital Sign    Days of Exercise per Week: 4 days    Minutes of Exercise per Session: 60 min  Stress: No Stress Concern Present (09/24/2022)   [employer] of Occupational Health - Occupational Stress Questionnaire    Feeling of Stress : Only a little  Social Connections: Moderately Integrated (09/24/2022)   Social Connection and Isolation Panel [NHANES]    Frequency of Communication with Friends and Family: More than three times a week    Frequency of Social Gatherings with Friends and Family: More than three times a week    Attends Religious Services: More than 4 times per year    Active Member of [employer] or Organizations: Yes    Attends Engineer, structural: More than 4 times per year     Marital Status: Divorced    Tobacco Counseling Counseling given: Yes   Clinical Intake:  Pre-visit preparation completed: Yes  Pain : No/denies pain     BMI - recorded: 32.44 Nutritional Status: BMI > 30  Obese Nutritional Risks: None Diabetes: No  How often do you need to have someone help you when you read instructions, pamphlets, or other written materials from your doctor or pharmacy?: 1 - Never  Interpreter Needed?: No  Information entered by ::  Virginie Josten,CMA   Activities of Daily Living    09/24/2022    9:38 AM 09/23/2022  8:51 AM  In your present state of health, do you have any difficulty performing the following activities:  Hearing? 0 0  Vision? 0 0  Difficulty concentrating or making decisions? 0 0  Walking or climbing stairs? 1 1  Dressing or bathing? 0 0  Doing errands, shopping? 0 0  Preparing Food and eating ? N N  Using the Toilet? N N  In the past six months, have you accidently leaked urine? N N  Do you have problems with loss of bowel control? N N  Managing your Medications? N N  Managing your Finances? N N  Housekeeping or managing your Housekeeping? N N    Patient Care Team: Dettinger, Elige Radon, MD as PCP - General (Family Medicine) Marcelino Duster, MD as Referring Physician (Dermatology) Michaelle Copas, MD as Referring Physician (Optometry)  Indicate any recent Medical Services you may have received from other than [facility] providers in the past year (date may be approximate).     Assessment:   This is a routine wellness examination for Jacorion.  Hearing/Vision screen Hearing Screening - Comments:: Patient denies any hearing difficulties.    Dietary issues and exercise activities discussed:     Goals Addressed               This Visit's Progress     Patient Stated (pt-stated)        Patients goal for the up coming year is to lose weight       Depression Screen    09/24/2022    9:41 AM 09/24/2022    7:57 AM  05/14/2022    8:19 AM 04/14/2022    8:10 AM 02/10/2022    8:07 AM 11/01/2021    8:02 AM 09/19/2021    8:22 AM  PHQ 2/9 Scores  PHQ - 2 Score 0 0 0 0 0 0 0  PHQ- 9 Score 0 0 1 1  0 0    Fall Risk    09/24/2022    9:39 AM 09/24/2022    9:34 AM 09/24/2022    7:57 AM 09/23/2022    8:51 AM 05/14/2022    8:19 AM  Fall Risk   Falls in the past year? 0 0 0 0 0  Number falls in past yr: 0 0  0   Injury with Fall? 0 0  0   Risk for fall due to : No Fall Risks No Fall Risks     Follow up Falls prevention discussed Falls prevention discussed       MEDICARE RISK AT HOME:  Medicare Risk at Home - 09/24/22 0939     Any stairs in or around the home? Yes    If so, are there any without handrails? Yes    Home free of loose throw rugs in walkways, pet beds, electrical cords, etc? Yes    Adequate lighting in your home to reduce risk of falls? Yes    Life alert? No    Use of a cane, walker or w/c? Yes    Grab bars in the bathroom? No    Shower chair or bench in shower? No    Elevated toilet seat or a handicapped toilet? No             TIMED UP AND GO:  Was the test performed?  No    Cognitive Function:        09/24/2022    9:34 AM 09/19/2021    8:25 AM 08/30/2019  2:29 PM  6CIT Screen  What Year? 0 points 0 points 0 points  What month? 0 points 0 points 0 points  What time? 0 points 0 points 0 points  Count back from 20 0 points 0 points 0 points  Months in reverse 0 points 0 points 0 points  Repeat phrase 0 points 0 points 0 points  Total Score 0 points 0 points 0 points    Immunizations Immunization History  Administered Date(s) Administered   Influenza,inj,Quad PF,6+ Mos 02/02/2013, 01/11/2014, 12/22/2014, 12/31/2015, 12/22/2016, 12/23/2017, 11/30/2018, 12/01/2019, 12/04/2020, 12/11/2021   Moderna SARS-COV2 Booster Vaccination 12/30/2021   Moderna Sars-Covid-2 Vaccination 05/26/2019, 06/24/2019, 01/12/2020, 10/04/2020, 12/27/2020   Pneumococcal Conjugate-13 10/16/2020   Tdap  10/10/2013   Zoster Recombinant(Shingrix) 11/01/2020, 03/12/2021    TDAP status: Up to date  Flu Vaccine status: Up to date  Pneumococcal vaccine status: Up to date  Covid-19 vaccine status: Information provided on how to obtain vaccines.   Qualifies for Shingles Vaccine? Yes   Zostavax completed No   Shingrix Completed?: Yes  Screening Tests Health Maintenance  Topic Date Due   COVID-19 Vaccine (7 - 2023-24 season) 02/24/2022   OPHTHALMOLOGY EXAM  07/25/2022   Diabetic kidney evaluation - Urine ACR  08/02/2022   Medicare Annual Wellness (AWV)  09/20/2022   INFLUENZA VACCINE  10/09/2022   HEMOGLOBIN A1C  11/14/2022   FOOT EXAM  02/11/2023   Diabetic kidney evaluation - eGFR measurement  05/14/2023   DTaP/Tdap/Td (2 - Td or Tdap) 10/11/2023   Colonoscopy  05/10/2026   Hepatitis C Screening  Completed   Zoster Vaccines- Shingrix  Completed   HPV VACCINES  Aged Out   HIV Screening  Discontinued    Health Maintenance  Health Maintenance Due  Topic Date Due   COVID-19 Vaccine (7 - 2023-24 season) 02/24/2022   OPHTHALMOLOGY EXAM  07/25/2022   Diabetic kidney evaluation - Urine ACR  08/02/2022   Medicare Annual Wellness (AWV)  09/20/2022    Colorectal cancer screening: Type of screening: Colonoscopy. Completed 05/09/2021. Repeat every 5 years  Lung Cancer Screening: (Low Dose CT Chest recommended if Age 23-80 years, 20 pack-year currently smoking OR have quit w/in 15years.) does not qualify.   Additional Screening:  Hepatitis C Screening: does not qualify; Completed 01/12/2015  Vision Screening: Recommended annual ophthalmology exams for early detection of glaucoma and other disorders of the eye. Is the patient up to date with their annual eye exam?  Yes  Who is the provider or what is the name of the office in which the patient attends annual eye exams? Dr. Daphine Deutscher, [facility] [place] If pt is not established with a provider, would they like to be referred to a  provider to establish care? No .   Dental Screening: Recommended annual dental exams for proper oral hygiene  Diabetic Foot Exam: Diabetic Foot Exam: Completed 02/10/2022  Community Resource Referral / Chronic Care Management: CRR required this visit?  No   CCM required this visit?  No     Plan:     I have personally reviewed and noted the following in the patient's chart:   Medical and social history Use of alcohol, tobacco or illicit drugs  Current medications and supplements including opioid prescriptions. Patient is not currently taking opioid prescriptions. Functional ability and status Nutritional status Physical activity Advanced directives List of other physicians Hospitalizations, surgeries, and ER visits in previous 12 months Vitals Screenings to include cognitive, depression, and falls Referrals and appointments  In addition,  I have reviewed and discussed with patient certain preventive protocols, quality metrics, and best practice recommendations. A written personalized care plan for preventive services as well as general preventive health recommendations were provided to patient.     Jordan Hawks Delton Stelle, CMA   09/24/2022   After Visit Summary: (MyChart) Due to this being a telephonic visit, the after visit summary with patients personalized plan was offered to patient via MyChart   Nurse Notes:

## 2022-09-25 LAB — CBC WITH DIFFERENTIAL/PLATELET
Basophils Absolute: 0 10*3/uL (ref 0.0–0.2)
Basos: 0 %
EOS (ABSOLUTE): 0.2 10*3/uL (ref 0.0–0.4)
Eos: 3 %
Hematocrit: 45 % (ref 37.5–51.0)
Hemoglobin: 15.2 g/dL (ref 13.0–17.7)
Immature Grans (Abs): 0 10*3/uL (ref 0.0–0.1)
Immature Granulocytes: 0 %
Lymphocytes Absolute: 1.3 10*3/uL (ref 0.7–3.1)
Lymphs: 24 %
MCH: 31.4 pg (ref 26.6–33.0)
MCHC: 33.8 g/dL (ref 31.5–35.7)
MCV: 93 fL (ref 79–97)
Monocytes Absolute: 0.4 10*3/uL (ref 0.1–0.9)
Monocytes: 8 %
Neutrophils Absolute: 3.6 10*3/uL (ref 1.4–7.0)
Neutrophils: 65 %
Platelets: 171 10*3/uL (ref 150–450)
RBC: 4.84 x10E6/uL (ref 4.14–5.80)
RDW: 12.6 % (ref 11.6–15.4)
WBC: 5.5 10*3/uL (ref 3.4–10.8)

## 2022-09-25 LAB — CMP14+EGFR
ALT: 23 IU/L (ref 0–44)
AST: 17 IU/L (ref 0–40)
Albumin: 4.5 g/dL (ref 3.9–4.9)
Alkaline Phosphatase: 112 IU/L (ref 44–121)
BUN/Creatinine Ratio: 12 (ref 10–24)
BUN: 18 mg/dL (ref 8–27)
Bilirubin Total: 0.5 mg/dL (ref 0.0–1.2)
CO2: 24 mmol/L (ref 20–29)
Calcium: 9.5 mg/dL (ref 8.6–10.2)
Chloride: 101 mmol/L (ref 96–106)
Creatinine, Ser: 1.54 mg/dL — ABNORMAL HIGH (ref 0.76–1.27)
Globulin, Total: 2.3 g/dL (ref 1.5–4.5)
Glucose: 134 mg/dL — ABNORMAL HIGH (ref 70–99)
Potassium: 4.3 mmol/L (ref 3.5–5.2)
Sodium: 140 mmol/L (ref 134–144)
Total Protein: 6.8 g/dL (ref 6.0–8.5)
eGFR: 51 mL/min/{1.73_m2} — ABNORMAL LOW (ref >59)

## 2022-09-25 LAB — LIPID PANEL
Chol/HDL Ratio: 3.6 ratio (ref 0.0–5.0)
Cholesterol, Total: 120 mg/dL (ref 100–199)
HDL: 33 mg/dL — ABNORMAL LOW (ref >39)
LDL Chol Calc (NIH): 63 mg/dL (ref 0–99)
Triglycerides: 136 mg/dL (ref 0–149)
VLDL Cholesterol Cal: 24 mg/dL (ref 5–40)

## 2022-09-25 LAB — MICROALBUMIN / CREATININE URINE RATIO
Creatinine, Urine: 264 mg/dL
Microalb/Creat Ratio: 26 mg/g creat (ref 0–29)
Microalbumin, Urine: 68 ug/mL

## 2022-10-09 DIAGNOSIS — E119 Type 2 diabetes mellitus without complications: Secondary | ICD-10-CM | POA: Diagnosis not present

## 2022-11-05 ENCOUNTER — Other Ambulatory Visit: Payer: Self-pay | Admitting: Family Medicine

## 2022-11-05 DIAGNOSIS — E1169 Type 2 diabetes mellitus with other specified complication: Secondary | ICD-10-CM

## 2022-11-05 DIAGNOSIS — Z8673 Personal history of transient ischemic attack (TIA), and cerebral infarction without residual deficits: Secondary | ICD-10-CM

## 2022-11-05 DIAGNOSIS — E782 Mixed hyperlipidemia: Secondary | ICD-10-CM

## 2022-11-09 DIAGNOSIS — E119 Type 2 diabetes mellitus without complications: Secondary | ICD-10-CM | POA: Diagnosis not present

## 2022-11-19 ENCOUNTER — Other Ambulatory Visit: Payer: Self-pay | Admitting: Family Medicine

## 2022-11-19 DIAGNOSIS — M1A0420 Idiopathic chronic gout, left hand, without tophus (tophi): Secondary | ICD-10-CM

## 2022-12-09 DIAGNOSIS — E119 Type 2 diabetes mellitus without complications: Secondary | ICD-10-CM | POA: Diagnosis not present

## 2022-12-18 DIAGNOSIS — K080 Exfoliation of teeth due to systemic causes: Secondary | ICD-10-CM | POA: Diagnosis not present

## 2022-12-26 ENCOUNTER — Encounter: Payer: Self-pay | Admitting: Family Medicine

## 2022-12-26 ENCOUNTER — Ambulatory Visit (INDEPENDENT_AMBULATORY_CARE_PROVIDER_SITE_OTHER): Payer: Medicare Other | Admitting: Family Medicine

## 2022-12-26 VITALS — BP 137/68 | HR 54 | Ht 64.0 in | Wt 189.0 lb

## 2022-12-26 DIAGNOSIS — E782 Mixed hyperlipidemia: Secondary | ICD-10-CM

## 2022-12-26 DIAGNOSIS — N1831 Chronic kidney disease, stage 3a: Secondary | ICD-10-CM

## 2022-12-26 DIAGNOSIS — I10 Essential (primary) hypertension: Secondary | ICD-10-CM | POA: Diagnosis not present

## 2022-12-26 DIAGNOSIS — Z23 Encounter for immunization: Secondary | ICD-10-CM

## 2022-12-26 DIAGNOSIS — R7303 Prediabetes: Secondary | ICD-10-CM

## 2022-12-26 DIAGNOSIS — S161XXA Strain of muscle, fascia and tendon at neck level, initial encounter: Secondary | ICD-10-CM

## 2022-12-26 DIAGNOSIS — Z8673 Personal history of transient ischemic attack (TIA), and cerebral infarction without residual deficits: Secondary | ICD-10-CM

## 2022-12-26 LAB — CMP14+EGFR
ALT: 30 [IU]/L (ref 0–44)
AST: 20 [IU]/L (ref 0–40)
Albumin: 4.2 g/dL (ref 3.9–4.9)
Alkaline Phosphatase: 107 [IU]/L (ref 44–121)
BUN/Creatinine Ratio: 15 (ref 10–24)
BUN: 21 mg/dL (ref 8–27)
Bilirubin Total: 0.3 mg/dL (ref 0.0–1.2)
CO2: 25 mmol/L (ref 20–29)
Calcium: 9.5 mg/dL (ref 8.6–10.2)
Chloride: 102 mmol/L (ref 96–106)
Creatinine, Ser: 1.43 mg/dL — ABNORMAL HIGH (ref 0.76–1.27)
Globulin, Total: 2.2 g/dL (ref 1.5–4.5)
Glucose: 132 mg/dL — ABNORMAL HIGH (ref 70–99)
Potassium: 4.4 mmol/L (ref 3.5–5.2)
Sodium: 139 mmol/L (ref 134–144)
Total Protein: 6.4 g/dL (ref 6.0–8.5)
eGFR: 56 mL/min/{1.73_m2} — ABNORMAL LOW (ref >59)

## 2022-12-26 LAB — BAYER DCA HB A1C WAIVED: HB A1C (BAYER DCA - WAIVED): 5.8 % — ABNORMAL HIGH (ref 4.8–5.6)

## 2022-12-26 LAB — CBC WITH DIFFERENTIAL/PLATELET
Basophils Absolute: 0 10*3/uL (ref 0.0–0.2)
Basos: 0 %
EOS (ABSOLUTE): 0.2 10*3/uL (ref 0.0–0.4)
Eos: 3 %
Hematocrit: 44 % (ref 37.5–51.0)
Hemoglobin: 14.4 g/dL (ref 13.0–17.7)
Immature Grans (Abs): 0 10*3/uL (ref 0.0–0.1)
Immature Granulocytes: 0 %
Lymphocytes Absolute: 1.3 10*3/uL (ref 0.7–3.1)
Lymphs: 22 %
MCH: 31.7 pg (ref 26.6–33.0)
MCHC: 32.7 g/dL (ref 31.5–35.7)
MCV: 97 fL (ref 79–97)
Monocytes Absolute: 0.4 10*3/uL (ref 0.1–0.9)
Monocytes: 7 %
Neutrophils Absolute: 3.9 10*3/uL (ref 1.4–7.0)
Neutrophils: 68 %
Platelets: 188 10*3/uL (ref 150–450)
RBC: 4.54 x10E6/uL (ref 4.14–5.80)
RDW: 13.1 % (ref 11.6–15.4)
WBC: 5.7 10*3/uL (ref 3.4–10.8)

## 2022-12-26 LAB — LIPID PANEL
Chol/HDL Ratio: 2.9 {ratio} (ref 0.0–5.0)
Cholesterol, Total: 103 mg/dL (ref 100–199)
HDL: 35 mg/dL — ABNORMAL LOW (ref >39)
LDL Chol Calc (NIH): 50 mg/dL (ref 0–99)
Triglycerides: 90 mg/dL (ref 0–149)
VLDL Cholesterol Cal: 18 mg/dL (ref 5–40)

## 2022-12-26 MED ORDER — CLOPIDOGREL BISULFATE 75 MG PO TABS: 75.0000 mg | ORAL_TABLET | Freq: Every day | ORAL | 1 refills | Status: DC

## 2022-12-26 NOTE — Progress Notes (Signed)
BP 137/68   Pulse (!) 54   Ht 5\' 4"  (1.626 m)   Wt 189 lb (85.7 kg)   SpO2 97%   BMI 32.44 kg/m    Subjective:   Patient ID: Bruce Guerrero, male    DOB: December 15, 1960, 62 y.o.   MRN: [id number]  HPI: Bruce Guerrero is a 62 y.o. male presenting on 12/26/2022 for Medical Management of Chronic Issues, Hyperlipidemia, Hypertension, and Prediabetes   HPI Prediabetes Patient comes in today for recheck of his diabetes. Patient has been currently taking no medicine currently, diet control. Patient is not currently on an ACE inhibitor/ARB. Patient has not seen an ophthalmologist this year. Patient denies any new issues with their feet. The symptom started onset as an adult CKD 3 ARE RELATED TO DM   Hypertension Patient is currently on amlodipine and carvedilol, and their blood pressure today is 137/68. Patient denies any lightheadedness or dizziness. Patient denies headaches, blurred vision, chest pains, shortness of breath, or weakness. Denies any side effects from medication and is content with current medication.   Hyperlipidemia Patient is coming in for recheck of his hyperlipidemia. The patient is currently taking atorvastatin. They deny any issues with myalgias or history of liver damage from it. They deny any focal numbness or weakness or chest pain.   Patient's only real complaint is that he had some left-sided neck muscle tightness has been going on for couple months and he has tried some muscle relaxers and Voltaren gel and had not seem to improve.  He denies anything severe but is just not getting better.  Relevant past medical, surgical, family and social history reviewed and updated as indicated. Interim medical history since our last visit reviewed. Allergies and medications reviewed and updated.  Review of Systems  Constitutional:  Negative for chills and fever.  Eyes:  Negative for visual disturbance.  Respiratory:  Negative for shortness of breath and wheezing.    Cardiovascular:  Negative for chest pain and leg swelling.  Musculoskeletal:  Positive for arthralgias, myalgias and neck pain. Negative for back pain and gait problem.  Skin:  Negative for rash.  Neurological:  Negative for dizziness, weakness and light-headedness.  All other systems reviewed and are negative.   Per HPI unless specifically indicated above   Allergies as of 12/26/2022   No Known Allergies      Medication List        Accurate as of December 26, 2022  8:59 AM. If you have any questions, ask your nurse or doctor.          allopurinol 300 MG tablet Commonly known as: ZYLOPRIM Take 1 tablet (300 mg total) by mouth daily.   amLODipine 10 MG tablet Commonly known as: NORVASC Take 1 tablet (10 mg total) by mouth daily.   atorvastatin 80 MG tablet Commonly known as: LIPITOR TAKE 1 TABLET BY MOUTH EVERY DAY AT 6 PM   baclofen 10 MG tablet Commonly known as: LIORESAL Take 1 tablet (10 mg total) by mouth 3 (three) times daily.   carvedilol 6.25 MG tablet Commonly known as: COREG Take 1 tablet (6.25 mg total) by mouth 2 (two) times daily with a meal.   Cinnamon 500 MG capsule Take 1,000 mg by mouth daily.   clopidogrel 75 MG tablet Commonly known as: PLAVIX Take 1 tablet (75 mg total) by mouth daily.   colchicine 0.6 MG tablet TAKE 1 TABLET BY MOUTH EVERY DAY   diclofenac 75 MG EC tablet Commonly known  as: VOLTAREN Take 1 tablet (75 mg total) by mouth 2 (two) times daily.   ferrous sulfate 325 (65 FE) MG tablet Take 325 mg by mouth daily with breakfast.   sertraline 50 MG tablet Commonly known as: ZOLOFT Take 1 tablet (50 mg total) by mouth daily.   terbinafine 250 MG tablet Commonly known as: LAMISIL TAKE 1 TABLET BY MOUTH EVERY DAY         Objective:   BP 137/68   Pulse (!) 54   Ht 5\' 4"  (1.626 m)   Wt 189 lb (85.7 kg)   SpO2 97%   BMI 32.44 kg/m   Wt Readings from Last 3 Encounters:  12/26/22 189 lb (85.7 kg)  09/24/22 189  lb (85.7 kg)  09/24/22 189 lb (85.7 kg)    Physical Exam Vitals and nursing note reviewed.  Constitutional:      General: He is not in acute distress.    Appearance: He is well-developed. He is not diaphoretic.  Eyes:     General: No scleral icterus.    Conjunctiva/sclera: Conjunctivae normal.  Neck:     Thyroid: No thyromegaly.  Cardiovascular:     Rate and Rhythm: Normal rate and regular rhythm.     Heart sounds: Normal heart sounds. No murmur heard. Pulmonary:     Effort: Pulmonary effort is normal. No respiratory distress.     Breath sounds: Normal breath sounds. No wheezing.  Musculoskeletal:        General: Normal range of motion.     Cervical back: Neck supple. Spasms and tenderness present. No bony tenderness or crepitus. Pain with movement present.       Back:  Lymphadenopathy:     Cervical: No cervical adenopathy.  Skin:    General: Skin is warm and dry.     Findings: No rash.  Neurological:     Mental Status: He is alert and oriented to person, place, and time.     Coordination: Coordination normal.  Psychiatric:        Behavior: Behavior normal.       Assessment & Plan:   Problem List Items Addressed This Visit       Cardiovascular and Mediastinum   Essential hypertension     Genitourinary   CKD (chronic kidney disease) stage 3, GFR 30-59 ml/min (HCC)   Relevant Orders   CBC with Differential/Platelet   CMP14+EGFR   Lipid panel   Bayer DCA Hb A1c Waived     Other   Hyperlipidemia   Prediabetes - Primary   Relevant Orders   CBC with Differential/Platelet   CMP14+EGFR   Lipid panel   Bayer DCA Hb A1c Waived   History of stroke   Relevant Medications   clopidogrel (PLAVIX) 75 MG tablet   Other Visit Diagnoses     Strain of neck muscle, initial encounter           A1c is 5.8 looks good, give him a list of stretches that he can use along with his muscle laxer and diclofenac gel for his leg.  If not improved we will we can send him to  physical therapy in the future.  No changes Follow up plan: Return in about 3 months (around 03/28/2023), or if symptoms worsen or fail to improve, for Prediabetes and hypertension and cholesterol.  Counseling provided for all of the vaccine components Orders Placed This Encounter  Procedures   CBC with Differential/Platelet   CMP14+EGFR   Lipid panel   [facility]  Hb A1c Waived    Arville Care, MD [facility] 12/26/2022, 8:59 AM

## 2023-01-06 DIAGNOSIS — K080 Exfoliation of teeth due to systemic causes: Secondary | ICD-10-CM | POA: Diagnosis not present

## 2023-01-07 DIAGNOSIS — L570 Actinic keratosis: Secondary | ICD-10-CM | POA: Diagnosis not present

## 2023-01-09 DIAGNOSIS — E119 Type 2 diabetes mellitus without complications: Secondary | ICD-10-CM | POA: Diagnosis not present

## 2023-01-13 DIAGNOSIS — H524 Presbyopia: Secondary | ICD-10-CM | POA: Diagnosis not present

## 2023-01-16 ENCOUNTER — Other Ambulatory Visit: Payer: Self-pay | Admitting: Medical Genetics

## 2023-01-16 DIAGNOSIS — Z006 Encounter for examination for normal comparison and control in clinical research program: Secondary | ICD-10-CM

## 2023-02-04 ENCOUNTER — Other Ambulatory Visit: Payer: Self-pay | Admitting: Family Medicine

## 2023-02-04 DIAGNOSIS — Z8673 Personal history of transient ischemic attack (TIA), and cerebral infarction without residual deficits: Secondary | ICD-10-CM

## 2023-02-04 DIAGNOSIS — E782 Mixed hyperlipidemia: Secondary | ICD-10-CM

## 2023-02-04 DIAGNOSIS — E1169 Type 2 diabetes mellitus with other specified complication: Secondary | ICD-10-CM

## 2023-02-08 DIAGNOSIS — E119 Type 2 diabetes mellitus without complications: Secondary | ICD-10-CM | POA: Diagnosis not present

## 2023-02-12 ENCOUNTER — Other Ambulatory Visit (HOSPITAL_COMMUNITY)
Admission: RE | Admit: 2023-02-12 | Discharge: 2023-02-12 | Disposition: A | Discharge status: Home / Self Care | Payer: Medicare Other | Source: Ambulatory Visit | Attending: Oncology | Admitting: Oncology

## 2023-02-12 DIAGNOSIS — Z006 Encounter for examination for normal comparison and control in clinical research program: Secondary | ICD-10-CM | POA: Insufficient documentation

## 2023-02-17 ENCOUNTER — Other Ambulatory Visit: Payer: Self-pay | Admitting: Family Medicine

## 2023-02-17 DIAGNOSIS — M1A0420 Idiopathic chronic gout, left hand, without tophus (tophi): Secondary | ICD-10-CM

## 2023-02-17 DIAGNOSIS — M654 Radial styloid tenosynovitis [de Quervain]: Secondary | ICD-10-CM

## 2023-02-23 LAB — GENECONNECT MOLECULAR SCREEN: Genetic Analysis Overall Interpretation: NEGATIVE

## 2023-02-24 ENCOUNTER — Other Ambulatory Visit: Payer: Self-pay | Admitting: Family Medicine

## 2023-02-24 DIAGNOSIS — B351 Tinea unguium: Secondary | ICD-10-CM

## 2023-03-09 ENCOUNTER — Ambulatory Visit: Payer: Self-pay | Admitting: Family Medicine

## 2023-03-09 ENCOUNTER — Telehealth: Payer: Self-pay | Admitting: Family Medicine

## 2023-03-09 NOTE — Telephone Encounter (Signed)
Copied from CRM [id number]. Topic: Clinical - Red Word Triage >> Mar 09, 2023 12:00 PM Quynette C wrote: Red Word that prompted transfer to Nurse Triage: Patient called in to schedule an appointment regarding his lower right back being in pain. Pain started about 2 weeks ago and has become worse. Transferred to Nurse Triage Line.   Chief Complaint: Back Pain Symptoms: Back pain Frequency: Ongoing for 2 weeks Pertinent Negatives: Patient denies radiating pain to other areas Disposition: [] ED /[] Urgent Care (no appt availability in office) / [x] Appointment(In office/virtual)/ []  [facility]/ [] Home Care/ [] Refused Recommended Disposition /[] [facility]/ []  Follow-up with PCP  Additional Notes: Patient has been having constant lower back pain for the past 2 weeks. He feels like the pain started after he was working underneath a car one day. No appointments available in the office in the next few days. Virtual visit scheduled for tomorrow.  Reason for Disposition  [1] MODERATE back pain (e.g., interferes with normal activities) AND [2] present > 3 days  Answer Assessment - Initial Assessment Questions 1. ONSET: "When did the pain begin?"      2 weeks ago  2. LOCATION: "Where does it hurt?" (upper, mid or lower back)     Lower back near hip   3. SEVERITY: "How bad is the pain?"  (e.g., Scale 1-10; mild, moderate, or severe)   - MILD (1-3): Doesn't interfere with normal activities.    - MODERATE (4-7): Interferes with normal activities or awakens from sleep.    - SEVERE (8-10): Excruciating pain, unable to do any normal activities.      2/10 at rest, 6 or 7 with activity   4. PATTERN: "Is the pain constant?" (e.g., yes, no; constant, intermittent)      Constant  5. RADIATION: "Does the pain shoot into your legs or somewhere else?"     No  6. CAUSE:  "What do you think is causing the back pain?"      Was recently doing work underneath a car, and back started hurting  same day  7. BACK OVERUSE:  "Any recent lifting of heavy objects, strenuous work or exercise?"     No heavy lifting  8. MEDICINES: "What have you taken so far for the pain?" (e.g., nothing, acetaminophen, NSAIDS)     Nothing for back, but already takes Diclofenac for neck pain  9. NEUROLOGIC SYMPTOMS: "Do you have any weakness, numbness, or problems with bowel/bladder control?"     No  10. OTHER SYMPTOMS: "Do you have any other symptoms?" (e.g., fever, abdomen pain, burning with urination, blood in urine)       No  Protocols used: Back Pain-A-AH

## 2023-03-09 NOTE — Telephone Encounter (Signed)
Appt scheduled for 03/16/2023

## 2023-03-09 NOTE — Telephone Encounter (Unsigned)
Copied from CRM [id number]. Topic: Appointments - Appointment Scheduling >> Mar 09, 2023 11:57 AM Carloyn Manner C wrote: Patient/patient representative is calling to schedule an appointment. Refer to attachments for appointment information.

## 2023-03-10 ENCOUNTER — Telehealth: Payer: Medicare Other

## 2023-03-11 DIAGNOSIS — E119 Type 2 diabetes mellitus without complications: Secondary | ICD-10-CM | POA: Diagnosis not present

## 2023-03-13 ENCOUNTER — Ambulatory Visit (INDEPENDENT_AMBULATORY_CARE_PROVIDER_SITE_OTHER): Payer: Medicare Other | Admitting: Family Medicine

## 2023-03-13 ENCOUNTER — Encounter: Payer: Self-pay | Admitting: Family Medicine

## 2023-03-13 VITALS — BP 151/63 | HR 52 | Ht 64.0 in | Wt 187.0 lb

## 2023-03-13 DIAGNOSIS — S161XXA Strain of muscle, fascia and tendon at neck level, initial encounter: Secondary | ICD-10-CM

## 2023-03-13 DIAGNOSIS — M545 Low back pain, unspecified: Secondary | ICD-10-CM

## 2023-03-13 DIAGNOSIS — S161XXD Strain of muscle, fascia and tendon at neck level, subsequent encounter: Secondary | ICD-10-CM

## 2023-03-13 MED ORDER — PREDNISONE 20 MG PO TABS: ORAL_TABLET | ORAL | 0 refills | Status: DC

## 2023-03-13 NOTE — Progress Notes (Signed)
 BP (!) 151/63   Pulse (!) 52   Ht 5' 4 (1.626 m)   Wt 187 lb (84.8 kg)   SpO2 97%   BMI 32.10 kg/m    Subjective:   Patient ID: Bruce Guerrero, male    DOB: 14-Jul-1960, 62 y.o.   MRN: [id number]  HPI: Bruce Guerrero is a 63 y.o. male presenting on 03/13/2023 for Back Pain (Lower right. New.)   HPI Right lower back pain Patient has new right lower back pain that is been going on for the past 3 weeks.  He has been taking his diclofenac  and it does not seem to be improving.  He also did some muscle rubs and does not seem to be improving.  He says it initially started when he was crawling under his car to try and fix something and he felt something pull then.  He denies any shooting pain up or down.  Is also been doing some stretches because his neck has been hurting so he is added some stretches for his back as well.  He says it is just not getting better on his back and his neck is the same and then just not been getting better.  Continued neck pain He has been doing the stretches at home and his neck is just not improving.  He continues to be stiff and he would like to try physical therapy for it.  Relevant past medical, surgical, family and social history reviewed and updated as indicated. Interim medical history since our last visit reviewed. Allergies and medications reviewed and updated.  Review of Systems  Constitutional:  Negative for chills and fever.  Musculoskeletal:  Positive for arthralgias, back pain, myalgias and neck pain. Negative for neck stiffness.    Per HPI unless specifically indicated above   Allergies as of 03/13/2023   No Known Allergies      Medication List        Accurate as of March 13, 2023 10:40 AM. If you have any questions, ask your nurse or doctor.          STOP taking these medications    colchicine  0.6 MG tablet Stopped by: Fonda LABOR Lakela Kuba       TAKE these medications    allopurinol  300 MG tablet Commonly known as:  ZYLOPRIM  Take 1 tablet (300 mg total) by mouth daily.   amLODipine  10 MG tablet Commonly known as: NORVASC  Take 1 tablet (10 mg total) by mouth daily.   atorvastatin  80 MG tablet Commonly known as: LIPITOR TAKE 1 TABLET BY MOUTH EVERY DAY AT 6 PM   baclofen  10 MG tablet Commonly known as: LIORESAL  Take 1 tablet (10 mg total) by mouth 3 (three) times daily.   carvedilol  6.25 MG tablet Commonly known as: COREG  Take 1 tablet (6.25 mg total) by mouth 2 (two) times daily with a meal.   Cinnamon 500 MG capsule Take 1,000 mg by mouth daily.   clopidogrel  75 MG tablet Commonly known as: PLAVIX  Take 1 tablet (75 mg total) by mouth daily.   diclofenac  75 MG EC tablet Commonly known as: VOLTAREN  TAKE 1 TABLET BY MOUTH TWICE A DAY   ferrous sulfate 325 (65 FE) MG tablet Take 325 mg by mouth daily with breakfast.   predniSONE  20 MG tablet Commonly known as: DELTASONE  2 po at same time daily for 5 days Started by: Fonda LABOR Amadi Yoshino   sertraline  50 MG tablet Commonly known as: ZOLOFT  Take 1 tablet (50 mg total)  by mouth daily.   terbinafine  250 MG tablet Commonly known as: LAMISIL  TAKE 1 TABLET BY MOUTH EVERY DAY         Objective:   BP (!) 151/63   Pulse (!) 52   Ht 5' 4 (1.626 m)   Wt 187 lb (84.8 kg)   SpO2 97%   BMI 32.10 kg/m   Wt Readings from Last 3 Encounters:  03/13/23 187 lb (84.8 kg)  12/26/22 189 lb (85.7 kg)  09/24/22 189 lb (85.7 kg)    Physical Exam Vitals and nursing note reviewed.  Constitutional:      Appearance: Normal appearance.  Musculoskeletal:     Cervical back: Tenderness (Paraspinal tenderness in neck on both sides) present.     Lumbar back: Tenderness present. No deformity or bony tenderness. Normal range of motion. Negative right straight leg raise test. No scoliosis.       Back:  Skin:    General: Skin is warm and dry.     Findings: No erythema, lesion or rash.  Neurological:     Mental Status: He is alert.        Assessment & Plan:   Problem List Items Addressed This Visit   None Visit Diagnoses       Right lumbar pain    -  Primary   Relevant Medications   predniSONE  (DELTASONE ) 20 MG tablet   Other Relevant Orders   Ambulatory referral to Physical Therapy     Strain of neck muscle, subsequent encounter       Relevant Medications   predniSONE  (DELTASONE ) 20 MG tablet   Other Relevant Orders   Ambulatory referral to Physical Therapy       Will do short course of prednisone , recommended that he hold the diclofenac  while he is doing the prednisone  and then will also be physical therapy referral. Follow up plan: Return if symptoms worsen or fail to improve.  Counseling provided for all of the vaccine components Orders Placed This Encounter  Procedures   Ambulatory referral to Physical Therapy    Fonda Levins, MD [facility] 03/13/2023, 10:40 AM

## 2023-03-16 ENCOUNTER — Ambulatory Visit: Payer: Medicare Other | Admitting: Family

## 2023-03-19 ENCOUNTER — Ambulatory Visit: Payer: Medicare Other | Attending: Family Medicine

## 2023-03-19 ENCOUNTER — Other Ambulatory Visit: Payer: Self-pay

## 2023-03-19 DIAGNOSIS — M5459 Other low back pain: Secondary | ICD-10-CM | POA: Diagnosis not present

## 2023-03-19 DIAGNOSIS — S161XXD Strain of muscle, fascia and tendon at neck level, subsequent encounter: Secondary | ICD-10-CM | POA: Insufficient documentation

## 2023-03-19 DIAGNOSIS — M545 Low back pain, unspecified: Secondary | ICD-10-CM | POA: Diagnosis not present

## 2023-03-19 DIAGNOSIS — M6281 Muscle weakness (generalized): Secondary | ICD-10-CM | POA: Diagnosis not present

## 2023-03-19 DIAGNOSIS — M542 Cervicalgia: Secondary | ICD-10-CM | POA: Diagnosis not present

## 2023-03-19 NOTE — Therapy (Signed)
 OUTPATIENT PHYSICAL THERAPY CERVICAL EVALUATION   Patient Name: Bruce Guerrero MRN: [id number] DOB:28-Aug-1960, 63 y.o., male Today's Date: 03/19/2023  END OF SESSION:  PT End of Session - 03/19/23 0931     Visit Number 1    Number of Visits 8    Date for PT Re-Evaluation 06/05/23    PT Start Time 0932    PT Stop Time 1012    PT Time Calculation (min) 40 min    Activity Tolerance Patient tolerated treatment well    Behavior During Therapy [facility] for tasks assessed/performed             Past Medical History:  Diagnosis Date   Gout    Hyperlipidemia    Hypertension    Stroke (HCC) 2014   Past Surgical History:  Procedure Laterality Date   HERNIA REPAIR  03/14/2022   NO PAST SURGERIES     XI ROBOTIC ASSISTED VENTRAL HERNIA N/A 03/14/2022   Procedure: XI ROBOTIC ASSISTED VENTRAL HERNIA W/ MESH;  Surgeon: Kallie Manuelita BROCKS, MD;  Location: [facility];  Service: General;  Laterality: N/A;   Patient Active Problem List   Diagnosis Date Noted   CKD (chronic kidney disease) stage 3, GFR 30-59 ml/min (HCC) 09/24/2022   Umbilical hernia without obstruction and without gangrene 02/25/2022   GAD (generalized anxiety disorder) 08/01/2021   History of stroke 01/02/2020   Prediabetes 01/23/2017   Depression 10/10/2013   Hyperlipidemia 10/10/2013   Spastic hemiplegia affecting dominant side (HCC) 05/23/2013   Difficulty walking 03/08/2013   Right leg weakness 03/08/2013   Balance problems 03/08/2013   Weakness of right upper extremity 03/08/2013   Gout 08/05/2012   Essential hypertension 08/05/2012   REFERRING PROVIDER: Dettinger, Fonda LABOR, MD   REFERRING DIAG: Right lumbar pain, Strain of neck muscle, subsequent encounter   THERAPY DIAG:  Cervicalgia  Other low back pain  Muscle weakness (generalized)  Rationale for Evaluation and Treatment: Rehabilitation  ONSET DATE: 4-5 months (neck pain) and 3-4 weeks (low back pain)   SUBJECTIVE:                                                                                                                                                                                                          SUBJECTIVE STATEMENT: Patient reports that his neck and low back have been bothering him. His neck has been hurting for about 4-5 months while his low back has only been hurting for 3-4 weeks. He knows that his back started hurting after moving something on the floor while he is  unsure why the neck is hurting.  Hand dominance: Left  PERTINENT HISTORY:  Hypertension, spastic hemiplegia affecting dominant side (right side affected), chronic kidney disease, depression, history of a CVA, anxiety, and hypertension  CERVICAL PAIN:  Are you having pain? Yes: NPRS scale: Current: 1/10 Best: 1/10 Worst: 7-8/10 Pain location: left upper trapezius Pain description: intermittent throbbing and ache Aggravating factors: laying down, pressure on his neck Relieving factors: prednisone    LUMBAR PAIN:  Are you having pain? Yes: NPRS scale: Current: 2/10 Best: 2/10 Worst: 7/10 Pain location: right low back/ hip Pain description: pressure and sore Aggravating factors: walking Relieving factors: prednisone   PRECAUTIONS: None  RED FLAGS: None     WEIGHT BEARING RESTRICTIONS: No  FALLS:  Has patient fallen in last 6 months?  No, but he has been unsteady since his stroke  LIVING ENVIRONMENT: Lives with: lives with their son Lives in: House/apartment Stairs: Yes: External: 3 steps; none Has following equipment at home: None  OCCUPATION: not working  PLOF: Independent  PATIENT GOALS: reduced pain, be able to lay on his left side, and walk longer  NEXT MD VISIT: 04/09/23  OBJECTIVE:  Note: Objective measures were completed at Evaluation unless otherwise noted.  PATIENT SURVEYS:  Modified Oswestry 46% disability  NDI 18% disability  COGNITION: Overall cognitive status: Within functional limits for tasks  assessed  SENSATION: Patient reports no numbness or tingling  PALPATION: TTP: left upper trapezius, right lumbar paraspinals, QL, and IT band   CERVICAL ROM:   Active ROM A/PROM (deg) eval  Flexion 40  Extension 40  Right lateral flexion 24  Left lateral flexion 26; familiar pain  Right rotation 61  Left rotation 38; familiar neck pain   (Blank rows = not tested)  LUMBAR ROM: Unable to be assessed due to time constraints  Active  A/PROM  eval  Flexion   Extension   Right lateral flexion   Left lateral flexion   Right rotation   Left rotation    (Blank rows = not tested)   UPPER EXTREMITY ROM: R UE limited due to previous CVA   UPPER EXTREMITY MMT: right UE unable to be assessed due to CVA   MMT Right eval Left eval  Shoulder flexion  4/5  Shoulder extension    Shoulder abduction  4-/5  Shoulder adduction    Shoulder extension    Shoulder internal rotation    Shoulder external rotation    Middle trapezius    Lower trapezius    Elbow flexion    Elbow extension    Wrist flexion    Wrist extension    Wrist ulnar deviation    Wrist radial deviation    Wrist pronation    Wrist supination    Grip strength     (Blank rows = not tested)  LOWER EXTREMITY MMT:    MMT Right eval Left eval  Hip flexion 4+/5 4+/5  Hip extension    Hip abduction    Hip adduction    Hip internal rotation    Hip external rotation    Knee flexion 3/5 5/5  Knee extension 4/5 4+/5  Ankle dorsiflexion    Ankle plantarflexion    Ankle inversion    Ankle eversion     (Blank rows = not tested)   TREATMENT DATE:  PATIENT EDUCATION:  Education details: Plan of care, prognosis, healing, anatomy, and goals for therapy Person educated: Patient Education method: Explanation Education comprehension: verbalized understanding  HOME EXERCISE  PROGRAM:   ASSESSMENT:  CLINICAL IMPRESSION: Patient is a 63 y.o. male who was seen today for physical therapy evaluation and treatment for chronic left cervical and acute right lumbar pain. He presented with low pain severity and irritability with palpation and cervical active range of motion reproducing his familiar symptoms. Right upper extremity manual muscle testing and active range of motion was unable to be assessed due to his stroke which occurred in 2014. Recommend that he continue with skilled physical therapy to address his impairments to return to his prior level of function.  OBJECTIVE IMPAIRMENTS: Abnormal gait, decreased activity tolerance, decreased mobility, difficulty walking, decreased ROM, decreased strength, hypomobility, impaired tone, impaired UE functional use, and pain.   ACTIVITY LIMITATIONS: carrying, lifting, and locomotion level  PARTICIPATION LIMITATIONS: meal prep, cleaning, laundry, driving, shopping, and community activity  PERSONAL FACTORS: Past/current experiences, Time since onset of injury/illness/exacerbation, and 3+ comorbidities: Hypertension, spastic hemiplegia affecting dominant side (right side affected), chronic kidney disease, depression, history of a CVA, anxiety, and hypertension  are also affecting patient's functional outcome.   REHAB POTENTIAL: Good  CLINICAL DECISION MAKING: Evolving/moderate complexity  EVALUATION COMPLEXITY: Moderate   GOALS: Goals reviewed with patient? Yes  LONG TERM GOALS: Target date: 04/16/23  Patient will be independent with his HEP. Baseline:  Goal status: INITIAL  2.  Patient will report being able to complete his daily activities without his familiar pain exceeding 5/10. Baseline:  Goal status: INITIAL  3.  Patient will report being able to sleep on his left side without being awakened by his familiar symptoms. Baseline:  Goal status: INITIAL  4.  Patient will improve his left cervical rotation to at  least 60 degrees for improved awareness of his surroundings. Baseline:  Goal status: INITIAL  5.  Patient will improve his modified Oswestry score to 36% or less for improved perceived function with his daily activities. Baseline:  Goal status: INITIAL  PLAN:  PT FREQUENCY: 2x/week  PT DURATION: 4 weeks  PLANNED INTERVENTIONS: 97164- PT Re-evaluation, 97110-Therapeutic exercises, 97530- Therapeutic activity, 97112- Neuromuscular re-education, 97535- Self Care, 02859- Manual therapy, 97116- Gait training, 97014- Electrical stimulation (unattended), 97035- Ultrasound, Patient/Family education, Balance training, Stair training, Dry Needling, Joint mobilization, Spinal mobilization, Cryotherapy, and Moist heat  PLAN FOR NEXT SESSION: NuStep, lumbar and lower extremity strengthening, upper trapezius stretch, manual therapy, and modalities as needed   Lacinda JAYSON Fass, PT 03/19/2023, 6:46 PM

## 2023-03-23 ENCOUNTER — Ambulatory Visit: Payer: Medicare Other

## 2023-03-23 DIAGNOSIS — M545 Low back pain, unspecified: Secondary | ICD-10-CM | POA: Diagnosis not present

## 2023-03-23 DIAGNOSIS — M6281 Muscle weakness (generalized): Secondary | ICD-10-CM | POA: Diagnosis not present

## 2023-03-23 DIAGNOSIS — S161XXD Strain of muscle, fascia and tendon at neck level, subsequent encounter: Secondary | ICD-10-CM | POA: Diagnosis not present

## 2023-03-23 DIAGNOSIS — M5459 Other low back pain: Secondary | ICD-10-CM | POA: Diagnosis not present

## 2023-03-23 DIAGNOSIS — M542 Cervicalgia: Secondary | ICD-10-CM | POA: Diagnosis not present

## 2023-03-23 NOTE — Therapy (Signed)
 OUTPATIENT PHYSICAL THERAPY CERVICAL TREATMENT   Patient Name: Bruce Guerrero MRN: [id number] DOB:1960/09/18, 63 y.o., male Today's Date: 03/23/2023  END OF SESSION:  PT End of Session - 03/23/23 1056     Visit Number 2    Number of Visits 8    Date for PT Re-Evaluation 06/05/23    PT Start Time 1015    PT Stop Time 1056    PT Time Calculation (min) 41 min    Activity Tolerance Patient tolerated treatment well    Behavior During Therapy [facility] for tasks assessed/performed              Past Medical History:  Diagnosis Date   Gout    Hyperlipidemia    Hypertension    Stroke (HCC) 2014   Past Surgical History:  Procedure Laterality Date   HERNIA REPAIR  03/14/2022   NO PAST SURGERIES     XI ROBOTIC ASSISTED VENTRAL HERNIA N/A 03/14/2022   Procedure: XI ROBOTIC ASSISTED VENTRAL HERNIA W/ MESH;  Surgeon: Kallie Manuelita BROCKS, MD;  Location: [facility];  Service: General;  Laterality: N/A;   Patient Active Problem List   Diagnosis Date Noted   CKD (chronic kidney disease) stage 3, GFR 30-59 ml/min (HCC) 09/24/2022   Umbilical hernia without obstruction and without gangrene 02/25/2022   GAD (generalized anxiety disorder) 08/01/2021   History of stroke 01/02/2020   Prediabetes 01/23/2017   Depression 10/10/2013   Hyperlipidemia 10/10/2013   Spastic hemiplegia affecting dominant side (HCC) 05/23/2013   Difficulty walking 03/08/2013   Right leg weakness 03/08/2013   Balance problems 03/08/2013   Weakness of right upper extremity 03/08/2013   Gout 08/05/2012   Essential hypertension 08/05/2012   REFERRING PROVIDER: Dettinger, Fonda LABOR, MD   REFERRING DIAG: Right lumbar pain, Strain of neck muscle, subsequent encounter   THERAPY DIAG:  Cervicalgia  Other low back pain  Muscle weakness (generalized)  Rationale for Evaluation and Treatment: Rehabilitation  ONSET DATE: 4-5 months (neck pain) and 3-4 weeks (low back pain)   SUBJECTIVE:                                                                                                                                                                                                          SUBJECTIVE STATEMENT: Patient reports that his neck is hurting. However, his back is not bothering him any today.  Hand dominance: Left  PERTINENT HISTORY:  Hypertension, spastic hemiplegia affecting dominant side (right side affected), chronic kidney disease, depression, history of a CVA, anxiety, and hypertension  CERVICAL PAIN:  Are you having pain? Yes: NPRS scale: Current: 4/10 Best: 1/10 Worst: 7-8/10 Pain location: left upper trapezius Pain description: intermittent throbbing and ache Aggravating factors: laying down, pressure on his neck Relieving factors: prednisone    LUMBAR PAIN:  Are you having pain? Yes: NPRS scale: Current: 0/10 Best: 2/10 Worst: 7/10 Pain location: right low back/ hip Pain description: pressure and sore Aggravating factors: walking Relieving factors: prednisone   PRECAUTIONS: None  RED FLAGS: None     WEIGHT BEARING RESTRICTIONS: No  FALLS:  Has patient fallen in last 6 months?  No, but he has been unsteady since his stroke  LIVING ENVIRONMENT: Lives with: lives with their son Lives in: House/apartment Stairs: Yes: External: 3 steps; none Has following equipment at home: None  OCCUPATION: not working  PLOF: Independent  PATIENT GOALS: reduced pain, be able to lay on his left side, and walk longer  NEXT MD VISIT: 04/09/23  OBJECTIVE:  Note: Objective measures were completed at Evaluation unless otherwise noted.  PATIENT SURVEYS:  Modified Oswestry 46% disability  NDI 18% disability  COGNITION: Overall cognitive status: Within functional limits for tasks assessed  SENSATION: Patient reports no numbness or tingling  PALPATION: TTP: left upper trapezius, right lumbar paraspinals, QL, and IT band   CERVICAL ROM:   Active ROM A/PROM (deg) eval  Flexion 40   Extension 40  Right lateral flexion 24  Left lateral flexion 26; familiar pain  Right rotation 61  Left rotation 38; familiar neck pain   (Blank rows = not tested)  LUMBAR ROM: Unable to be assessed due to time constraints  Active  A/PROM  eval  Flexion   Extension   Right lateral flexion   Left lateral flexion   Right rotation   Left rotation    (Blank rows = not tested)   UPPER EXTREMITY ROM: R UE limited due to previous CVA   UPPER EXTREMITY MMT: right UE unable to be assessed due to CVA   MMT Right eval Left eval  Shoulder flexion  4/5  Shoulder extension    Shoulder abduction  4-/5  Shoulder adduction    Shoulder extension    Shoulder internal rotation    Shoulder external rotation    Middle trapezius    Lower trapezius    Elbow flexion    Elbow extension    Wrist flexion    Wrist extension    Wrist ulnar deviation    Wrist radial deviation    Wrist pronation    Wrist supination    Grip strength     (Blank rows = not tested)  LOWER EXTREMITY MMT:    MMT Right eval Left eval  Hip flexion 4+/5 4+/5  Hip extension    Hip abduction    Hip adduction    Hip internal rotation    Hip external rotation    Knee flexion 3/5 5/5  Knee extension 4/5 4+/5  Ankle dorsiflexion    Ankle plantarflexion    Ankle inversion    Ankle eversion     (Blank rows = not tested)   TREATMENT DATE:  03/23/23 EXERCISE LOG  Exercise Repetitions and Resistance Comments  L UT stretch  3 x 30 seconds    L levator scapulae stretch  3 x 30 seconds   L row Blue t-band x 30 reps   L pull down Blut t-band x 30 reps   L shoulder shrug 6# x 25 reps         Blank cell = exercise not performed today  Manual Therapy Soft Tissue Mobilization: L upper trapezius and levator scapulae, for reduced pain and tone     PATIENT  EDUCATION:  Education details: Plan of care, prognosis, healing, anatomy, and goals for therapy Person educated: Patient Education method: Explanation Education comprehension: verbalized understanding  HOME EXERCISE PROGRAM: [id number]  ASSESSMENT:  CLINICAL IMPRESSION: Today's treatment focused on his cervical symptoms. Treatment was initiated with manual therapy which focused on soft tissue mobilization to his left upper trapezius and levator scapulae which was able to significantly reduce his familiar symptoms. This was followed by appropriately matched interventions to facilitate periscapular stability. He was provided an updated HEP which he reported feeling comfortable performing at home. He reported that his neck felt better upon the conclusion of treatment. He continues to require skilled physical therapy to address his remaining impairments to return to his prior level of function.   OBJECTIVE IMPAIRMENTS: Abnormal gait, decreased activity tolerance, decreased mobility, difficulty walking, decreased ROM, decreased strength, hypomobility, impaired tone, impaired UE functional use, and pain.   ACTIVITY LIMITATIONS: carrying, lifting, and locomotion level  PARTICIPATION LIMITATIONS: meal prep, cleaning, laundry, driving, shopping, and community activity  PERSONAL FACTORS: Past/current experiences, Time since onset of injury/illness/exacerbation, and 3+ comorbidities: Hypertension, spastic hemiplegia affecting dominant side (right side affected), chronic kidney disease, depression, history of a CVA, anxiety, and hypertension  are also affecting patient's functional outcome.   REHAB POTENTIAL: Good  CLINICAL DECISION MAKING: Evolving/moderate complexity  EVALUATION COMPLEXITY: Moderate   GOALS: Goals reviewed with patient? Yes  LONG TERM GOALS: Target date: 04/16/23  Patient will be independent with his HEP. Baseline:  Goal status: INITIAL  2.  Patient will report being able to  complete his daily activities without his familiar pain exceeding 5/10. Baseline:  Goal status: INITIAL  3.  Patient will report being able to sleep on his left side without being awakened by his familiar symptoms. Baseline:  Goal status: INITIAL  4.  Patient will improve his left cervical rotation to at least 60 degrees for improved awareness of his surroundings. Baseline:  Goal status: INITIAL  5.  Patient will improve his modified Oswestry score to 36% or less for improved perceived function with his daily activities. Baseline:  Goal status: INITIAL  PLAN:  PT FREQUENCY: 2x/week  PT DURATION: 4 weeks  PLANNED INTERVENTIONS: 97164- PT Re-evaluation, 97110-Therapeutic exercises, 97530- Therapeutic activity, 97112- Neuromuscular re-education, 97535- Self Care, [id number]- Manual therapy, 97116- Gait training, 97014- Electrical stimulation (unattended), 97035- Ultrasound, Patient/Family education, Balance training, Stair training, Dry Needling, Joint mobilization, Spinal mobilization, Cryotherapy, and Moist heat  PLAN FOR NEXT SESSION: NuStep, lumbar and lower extremity strengthening, upper trapezius stretch, manual therapy, and modalities as needed   Lacinda JAYSON Fass, PT 03/23/2023, 11:19 AM

## 2023-03-25 ENCOUNTER — Ambulatory Visit: Payer: Medicare Other

## 2023-03-25 DIAGNOSIS — M6281 Muscle weakness (generalized): Secondary | ICD-10-CM

## 2023-03-25 DIAGNOSIS — M542 Cervicalgia: Secondary | ICD-10-CM | POA: Diagnosis not present

## 2023-03-25 DIAGNOSIS — M5459 Other low back pain: Secondary | ICD-10-CM | POA: Diagnosis not present

## 2023-03-25 DIAGNOSIS — M545 Low back pain, unspecified: Secondary | ICD-10-CM | POA: Diagnosis not present

## 2023-03-25 DIAGNOSIS — S161XXD Strain of muscle, fascia and tendon at neck level, subsequent encounter: Secondary | ICD-10-CM | POA: Diagnosis not present

## 2023-03-25 NOTE — Therapy (Signed)
 OUTPATIENT PHYSICAL THERAPY CERVICAL TREATMENT   Patient Name: Bruce Guerrero MRN: [id number] DOB:1960-09-07, 63 y.o., male Today's Date: 03/25/2023  END OF SESSION:  PT End of Session - 03/25/23 1020     Visit Number 3    Number of Visits 8    Date for PT Re-Evaluation 06/05/23    PT Start Time 1015    Activity Tolerance Patient tolerated treatment well    Behavior During Therapy [facility] for tasks assessed/performed              Past Medical History:  Diagnosis Date   Gout    Hyperlipidemia    Hypertension    Stroke [facility]) 2014   Past Surgical History:  Procedure Laterality Date   HERNIA REPAIR  03/14/2022   NO PAST SURGERIES     XI ROBOTIC ASSISTED VENTRAL HERNIA N/A 03/14/2022   Procedure: XI ROBOTIC ASSISTED VENTRAL HERNIA W/ MESH;  Surgeon: Awilda Bogus, MD;  Location: [facility];  Service: General;  Laterality: N/A;   Patient Active Problem List   Diagnosis Date Noted   CKD (chronic kidney disease) stage 3, GFR 30-59 ml/min (HCC) 09/24/2022   Umbilical hernia without obstruction and without gangrene 02/25/2022   GAD (generalized anxiety disorder) 08/01/2021   History of stroke 01/02/2020   Prediabetes 01/23/2017   Depression 10/10/2013   Hyperlipidemia 10/10/2013   Spastic hemiplegia affecting dominant side (HCC) 05/23/2013   Difficulty walking 03/08/2013   Right leg weakness 03/08/2013   Balance problems 03/08/2013   Weakness of right upper extremity 03/08/2013   Gout 08/05/2012   Essential hypertension 08/05/2012   REFERRING PROVIDER: Dettinger, Lucio Sabin, MD   REFERRING DIAG: Right lumbar pain, Strain of neck muscle, subsequent encounter   THERAPY DIAG:  Cervicalgia  Other low back pain  Muscle weakness (generalized)  Rationale for Evaluation and Treatment: Rehabilitation  ONSET DATE: 4-5 months (neck pain) and 3-4 weeks (low back pain)   SUBJECTIVE:                                                                                                                                                                                                          SUBJECTIVE STATEMENT: Pt reports 2-3/10 left neck pain.  Hand dominance: Left  PERTINENT HISTORY:  Hypertension, spastic hemiplegia affecting dominant side (right side affected), chronic kidney disease, depression, history of a CVA, anxiety, and hypertension  CERVICAL PAIN:  Are you having pain? Yes: NPRS scale: Current: 2-3/10 Best: 1/10 Worst: 7-8/10 Pain location: left upper trapezius Pain description: intermittent throbbing and ache Aggravating  factors: laying down, pressure on his neck Relieving factors: prednisone    LUMBAR PAIN:  Are you having pain? Yes: NPRS scale: Current: 0/10 Best: 2/10 Worst: 7/10 Pain location: right low back/ hip Pain description: pressure and sore Aggravating factors: walking Relieving factors: prednisone   PRECAUTIONS: None  RED FLAGS: None     WEIGHT BEARING RESTRICTIONS: No  FALLS:  Has patient fallen in last 6 months?  No, but he has been unsteady since his stroke  LIVING ENVIRONMENT: Lives with: lives with their son Lives in: House/apartment Stairs: Yes: External: 3 steps; none Has following equipment at home: None  OCCUPATION: not working  PLOF: Independent  PATIENT GOALS: reduced pain, be able to lay on his left side, and walk longer  NEXT MD VISIT: 04/09/23  OBJECTIVE:  Note: Objective measures were completed at Evaluation unless otherwise noted.  PATIENT SURVEYS:  Modified Oswestry 46% disability  NDI 18% disability  COGNITION: Overall cognitive status: Within functional limits for tasks assessed  SENSATION: Patient reports no numbness or tingling  PALPATION: TTP: left upper trapezius, right lumbar paraspinals, QL, and IT band   CERVICAL ROM:   Active ROM A/PROM (deg) eval  Flexion 40  Extension 40  Right lateral flexion 24  Left lateral flexion 26; familiar pain  Right rotation 61  Left rotation  38; familiar neck pain   (Blank rows = not tested)  LUMBAR ROM: Unable to be assessed due to time constraints  Active  A/PROM  eval  Flexion   Extension   Right lateral flexion   Left lateral flexion   Right rotation   Left rotation    (Blank rows = not tested)   UPPER EXTREMITY ROM: R UE limited due to previous CVA   UPPER EXTREMITY MMT: right UE unable to be assessed due to CVA   MMT Right eval Left eval  Shoulder flexion  4/5  Shoulder extension    Shoulder abduction  4-/5  Shoulder adduction    Shoulder extension    Shoulder internal rotation    Shoulder external rotation    Middle trapezius    Lower trapezius    Elbow flexion    Elbow extension    Wrist flexion    Wrist extension    Wrist ulnar deviation    Wrist radial deviation    Wrist pronation    Wrist supination    Grip strength     (Blank rows = not tested)  LOWER EXTREMITY MMT:    MMT Right eval Left eval  Hip flexion 4+/5 4+/5  Hip extension    Hip abduction    Hip adduction    Hip internal rotation    Hip external rotation    Knee flexion 3/5 5/5  Knee extension 4/5 4+/5  Ankle dorsiflexion    Ankle plantarflexion    Ankle inversion    Ankle eversion     (Blank rows = not tested)   TREATMENT DATE:  03/25/23                        EXERCISE LOG  Exercise Repetitions and Resistance Comments  L row Blue x 30 reps   L pull down Blue x 30 reps   L shoulder Shrug 6# x 30 reps        Blank cell = exercise not performed today   Manual Therapy Soft Tissue Mobilization: left cervical, STW/M to left cervical paraspinals, left upper trap, and left levator scapula to decrease pain and tone    Modalities  Date:  Unattended Estim: Cervical, IFC 80-150 hz, 15 mins, Pain and Tone Hot Pack: Cervical, 15 mins, Pain and Tone                                     03/23/23 EXERCISE LOG  Exercise Repetitions and Resistance Comments  L UT stretch  3 x 30 seconds    L levator scapulae stretch  3 x 30 seconds   L row Blue t-band x 30 reps   L pull down Blut t-band x 30 reps   L shoulder shrug 6# x 25 reps         Blank cell = exercise not performed today  Manual Therapy Soft Tissue Mobilization: L upper trapezius and levator scapulae, for reduced pain and tone     PATIENT EDUCATION:  Education details: Plan of care, prognosis, healing, anatomy, and goals for therapy Person educated: Patient Education method: Explanation Education comprehension: verbalized understanding  HOME EXERCISE PROGRAM: [id number]  ASSESSMENT:  CLINICAL IMPRESSION: Pt arrives for today's treatment session reporting 3/10 left cervical pain.  Reviewed previously performed exercises due to relief felt after last treatment session.  STW/M performed to left upper trap, cervical paraspinals, and levator scap to decrease pain and tone with good results.  Normal responses to estim and MH noted upon removal.  Pt reported 1/10 left cervical pain at completion of today's treatment session.   OBJECTIVE IMPAIRMENTS: Abnormal gait, decreased activity tolerance, decreased mobility, difficulty walking, decreased ROM, decreased strength, hypomobility, impaired tone, impaired UE functional use, and pain.   ACTIVITY LIMITATIONS: carrying, lifting, and locomotion level  PARTICIPATION LIMITATIONS: meal prep, cleaning, laundry, driving, shopping, and community activity  PERSONAL FACTORS: Past/current experiences, Time since onset of injury/illness/exacerbation, and 3+ comorbidities: Hypertension, spastic hemiplegia affecting dominant side (right side affected), chronic kidney disease, depression, history of a CVA, anxiety, and hypertension  are also affecting patient's functional outcome.   REHAB POTENTIAL: Good  CLINICAL DECISION MAKING: Evolving/moderate complexity  EVALUATION COMPLEXITY:  Moderate   GOALS: Goals reviewed with patient? Yes  LONG TERM GOALS: Target date: 04/16/23  Patient will be independent with his HEP. Baseline:  Goal status: INITIAL  2.  Patient will report being able to complete his daily activities without his familiar pain exceeding 5/10. Baseline:  Goal status: INITIAL  3.  Patient will report being able to sleep on his left side without being awakened by his familiar symptoms. Baseline:  Goal status: INITIAL  4.  Patient will improve his left cervical rotation to at least 60 degrees for improved awareness of his surroundings. Baseline:  Goal status: INITIAL  5.  Patient will improve his modified Oswestry score to 36% or less for improved perceived function with his daily activities. Baseline:  Goal status: INITIAL  PLAN:  PT FREQUENCY: 2x/week  PT DURATION: 4 weeks  PLANNED INTERVENTIONS: 97164- PT Re-evaluation, 97110-Therapeutic exercises, 97530- Therapeutic activity, [id number]- Neuromuscular re-education, 97535- Self Care, 16109- Manual therapy, [phone]- Gait training, 97014- Electrical stimulation (unattended), [phone]- Ultrasound, Patient/Family education, Balance training, Stair training, Dry Needling, Joint mobilization, Spinal mobilization, Cryotherapy, and Moist heat  PLAN FOR NEXT SESSION: NuStep, lumbar and lower extremity strengthening, upper trapezius stretch, manual therapy, and modalities as needed   Deryl Flora, PTA 03/25/2023, 11:07 AM

## 2023-03-30 ENCOUNTER — Ambulatory Visit: Payer: Medicare Other | Admitting: *Deleted

## 2023-03-30 ENCOUNTER — Encounter: Payer: Self-pay | Admitting: *Deleted

## 2023-03-30 DIAGNOSIS — M5459 Other low back pain: Secondary | ICD-10-CM | POA: Diagnosis not present

## 2023-03-30 DIAGNOSIS — M545 Low back pain, unspecified: Secondary | ICD-10-CM | POA: Diagnosis not present

## 2023-03-30 DIAGNOSIS — M6281 Muscle weakness (generalized): Secondary | ICD-10-CM | POA: Diagnosis not present

## 2023-03-30 DIAGNOSIS — M542 Cervicalgia: Secondary | ICD-10-CM | POA: Diagnosis not present

## 2023-03-30 DIAGNOSIS — S161XXD Strain of muscle, fascia and tendon at neck level, subsequent encounter: Secondary | ICD-10-CM | POA: Diagnosis not present

## 2023-03-30 NOTE — Therapy (Signed)
OUTPATIENT PHYSICAL THERAPY CERVICAL TREATMENT   Patient Name: Bruce Guerrero MRN: [id number] DOB:1960-07-23, 63 y.o., male Today's Date: 03/30/2023  END OF SESSION:  PT End of Session - 03/30/23 1056     Visit Number 4    Number of Visits 8    Date for PT Re-Evaluation 06/05/23    PT Start Time 1056    PT Stop Time 1150    PT Time Calculation (min) 54 min              Past Medical History:  Diagnosis Date   Gout    Hyperlipidemia    Hypertension    Stroke (HCC) 2014   Past Surgical History:  Procedure Laterality Date   HERNIA REPAIR  03/14/2022   NO PAST SURGERIES     XI ROBOTIC ASSISTED VENTRAL HERNIA N/A 03/14/2022   Procedure: XI ROBOTIC ASSISTED VENTRAL HERNIA W/ MESH;  Surgeon: Lucretia Roers, MD;  Location: [facility];  Service: General;  Laterality: N/A;   Patient Active Problem List   Diagnosis Date Noted   CKD (chronic kidney disease) stage 3, GFR 30-59 ml/min (HCC) 09/24/2022   Umbilical hernia without obstruction and without gangrene 02/25/2022   GAD (generalized anxiety disorder) 08/01/2021   History of stroke 01/02/2020   Prediabetes 01/23/2017   Depression 10/10/2013   Hyperlipidemia 10/10/2013   Spastic hemiplegia affecting dominant side (HCC) 05/23/2013   Difficulty walking 03/08/2013   Right leg weakness 03/08/2013   Balance problems 03/08/2013   Weakness of right upper extremity 03/08/2013   Gout 08/05/2012   Essential hypertension 08/05/2012   REFERRING PROVIDER: Dettinger, Elige Radon, MD   REFERRING DIAG: Right lumbar pain, Strain of neck muscle, subsequent encounter   THERAPY DIAG:  Cervicalgia  Other low back pain  Muscle weakness (generalized)  Rationale for Evaluation and Treatment: Rehabilitation  ONSET DATE: 4-5 months (neck pain) and 3-4 weeks (low back pain)   SUBJECTIVE:                                                                                                                                                                                                          SUBJECTIVE STATEMENT: Pt reports 2-3/10 left neck pain today. LB is doing good  Hand dominance: Left  PERTINENT HISTORY:  Hypertension, spastic hemiplegia affecting dominant side (right side affected), chronic kidney disease, depression, history of a CVA, anxiety, and hypertension  CERVICAL PAIN:  Are you having pain? Yes: NPRS scale: Current: 2-3/10 Best: 1/10 Worst: 7-8/10 Pain location: left upper trapezius Pain description: intermittent throbbing and  ache Aggravating factors: laying down, pressure on his neck Relieving factors: prednisone   LUMBAR PAIN:  Are you having pain? Yes: NPRS scale: Current: 0/10 Best: 2/10 Worst: 7/10 Pain location: right low back/ hip Pain description: pressure and sore Aggravating factors: walking Relieving factors: prednisone  PRECAUTIONS: None  RED FLAGS: None     WEIGHT BEARING RESTRICTIONS: No  FALLS:  Has patient fallen in last 6 months?  No, but he has been unsteady since his stroke  LIVING ENVIRONMENT: Lives with: lives with their son Lives in: House/apartment Stairs: Yes: External: 3 steps; none Has following equipment at home: None  OCCUPATION: not working  PLOF: Independent  PATIENT GOALS: reduced pain, be able to lay on his left side, and walk longer  NEXT MD VISIT: 04/09/23  OBJECTIVE:  Note: Objective measures were completed at Evaluation unless otherwise noted.  PATIENT SURVEYS:  Modified Oswestry 46% disability  NDI 18% disability  COGNITION: Overall cognitive status: Within functional limits for tasks assessed  SENSATION: Patient reports no numbness or tingling  PALPATION: TTP: left upper trapezius, right lumbar paraspinals, QL, and IT band   CERVICAL ROM:   Active ROM A/PROM (deg) eval  Flexion 40  Extension 40  Right lateral flexion 24  Left lateral flexion 26; familiar pain  Right rotation 61  Left rotation 38; familiar neck pain   (Blank rows  = not tested)  LUMBAR ROM: Unable to be assessed due to time constraints  Active  A/PROM  eval  Flexion   Extension   Right lateral flexion   Left lateral flexion   Right rotation   Left rotation    (Blank rows = not tested)   UPPER EXTREMITY ROM: R UE limited due to previous CVA   UPPER EXTREMITY MMT: right UE unable to be assessed due to CVA   MMT Right eval Left eval  Shoulder flexion  4/5  Shoulder extension    Shoulder abduction  4-/5  Shoulder adduction    Shoulder extension    Shoulder internal rotation    Shoulder external rotation    Middle trapezius    Lower trapezius    Elbow flexion    Elbow extension    Wrist flexion    Wrist extension    Wrist ulnar deviation    Wrist radial deviation    Wrist pronation    Wrist supination    Grip strength     (Blank rows = not tested)  LOWER EXTREMITY MMT:    MMT Right eval Left eval  Hip flexion 4+/5 4+/5  Hip extension    Hip abduction    Hip adduction    Hip internal rotation    Hip external rotation    Knee flexion 3/5 5/5  Knee extension 4/5 4+/5  Ankle dorsiflexion    Ankle plantarflexion    Ankle inversion    Ankle eversion     (Blank rows = not tested)   TREATMENT DATE:  03/30/23                        EXERCISE LOG   LT side neck  Exercise Repetitions and Resistance Comments  LT UE row Blue x 30 reps   LT UE pull down Blue x 30 reps   L shoulder Shrug 6# x 30 reps        Blank cell = exercise not performed today  [name] combo 1.5 w/cm2 x 10 mins 1.5 w/cm2   Manual Therapy Soft Tissue Mobilization: left cervical, STW/M to left cervical paraspinals, left upper trap, and left levator scapula to decrease pain and tone    Modalities  Date: seated Unattended Estim: Cervical, IFC 80-150 hz, 15 mins, Pain and Tone Hot Pack: Cervical, 15 mins, Pain and Tone                                     03/23/23 EXERCISE LOG  Exercise Repetitions and Resistance Comments  L UT stretch  3 x 30 seconds    L levator scapulae stretch  3 x 30 seconds   L row Blue t-band x 30 reps   L pull down Blut t-band x 30 reps   L shoulder shrug 6# x 25 reps         Blank cell = exercise not performed today  Manual Therapy Soft Tissue Mobilization: L upper trapezius and levator scapulae, for reduced pain and tone     PATIENT EDUCATION:  Education details: Plan of care, prognosis, healing, anatomy, and goals for therapy Person educated: Patient Education method: Explanation Education comprehension: verbalized understanding  HOME EXERCISE PROGRAM: [id number]  ASSESSMENT:  CLINICAL IMPRESSION: Pt arrived for today's treatment session reporting 2/10 left side cervical pain. Pt was able to perform all exercises treatment session. [name] combo as well as STW/M  was performed to left upper trap, cervical paraspinals, and levator scap to decrease pain and tone with good results.  Normal responses to estim and MH noted upon removal. Notable tenderness to LT side cerv. Paras C6-7 area.   OBJECTIVE IMPAIRMENTS: Abnormal gait, decreased activity tolerance, decreased mobility, difficulty walking, decreased ROM, decreased strength, hypomobility, impaired tone, impaired UE functional use, and pain.   ACTIVITY LIMITATIONS: carrying, lifting, and locomotion level  PARTICIPATION LIMITATIONS: meal prep, cleaning, laundry, driving, shopping, and community activity  PERSONAL FACTORS: Past/current experiences, Time since onset of injury/illness/exacerbation, and 3+ comorbidities: Hypertension, spastic hemiplegia affecting dominant side (right side affected), chronic kidney disease, depression, history of a CVA, anxiety, and hypertension  are also affecting patient's functional outcome.   REHAB POTENTIAL: Good  CLINICAL DECISION MAKING: Evolving/moderate complexity  EVALUATION COMPLEXITY:  Moderate   GOALS: Goals reviewed with patient? Yes  LONG TERM GOALS: Target date: 04/16/23  Patient will be independent with his HEP. Baseline:  Goal status: INITIAL  2.  Patient will report being able to complete his daily activities without his familiar pain exceeding 5/10. Baseline:  Goal status: INITIAL  3.  Patient will report being able to sleep on his left side without being awakened by his familiar symptoms. Baseline:  Goal status: INITIAL  4.  Patient will improve his left cervical rotation to at least 60 degrees for improved awareness of his surroundings. Baseline:  Goal status: INITIAL  5.  Patient will improve his modified Oswestry score to 36% or less for improved perceived function with his daily activities.  Baseline:  Goal status: INITIAL  PLAN:  PT FREQUENCY: 2x/week  PT DURATION: 4 weeks  PLANNED INTERVENTIONS: 97164- PT Re-evaluation, 97110-Therapeutic exercises, 97530- Therapeutic activity, 97112- Neuromuscular re-education, 97535- Self Care, 25956- Manual therapy, 97116- Gait training, 97014- Electrical stimulation (unattended), 97035- Ultrasound, Patient/Family education, Balance training, Stair training, Dry Needling, Joint mobilization, Spinal mobilization, Cryotherapy, and Moist heat  PLAN FOR NEXT SESSION: NuStep, lumbar and lower extremity strengthening, upper trapezius stretch, manual therapy, and modalities as needed   Marnette Perkins,CHRIS, PTA 03/30/2023, 12:07 PM

## 2023-04-01 ENCOUNTER — Ambulatory Visit: Payer: Medicare Other

## 2023-04-01 DIAGNOSIS — M6281 Muscle weakness (generalized): Secondary | ICD-10-CM | POA: Diagnosis not present

## 2023-04-01 DIAGNOSIS — M5459 Other low back pain: Secondary | ICD-10-CM

## 2023-04-01 DIAGNOSIS — M542 Cervicalgia: Secondary | ICD-10-CM | POA: Diagnosis not present

## 2023-04-01 DIAGNOSIS — M545 Low back pain, unspecified: Secondary | ICD-10-CM | POA: Diagnosis not present

## 2023-04-01 DIAGNOSIS — S161XXD Strain of muscle, fascia and tendon at neck level, subsequent encounter: Secondary | ICD-10-CM | POA: Diagnosis not present

## 2023-04-01 NOTE — Therapy (Addendum)
OUTPATIENT PHYSICAL THERAPY CERVICAL TREATMENT   Patient Name: Bruce Guerrero MRN: [id number] DOB:April 10, 1960, 63 y.o., male Today's Date: 04/01/2023  END OF SESSION:  PT End of Session - 04/01/23 1019     Visit Number 5    Number of Visits 8    Date for PT Re-Evaluation 06/05/23    PT Start Time 1015    PT Stop Time 1114    PT Time Calculation (min) 59 min              Past Medical History:  Diagnosis Date   Gout    Hyperlipidemia    Hypertension    Stroke (HCC) 2014   Past Surgical History:  Procedure Laterality Date   HERNIA REPAIR  03/14/2022   NO PAST SURGERIES     XI ROBOTIC ASSISTED VENTRAL HERNIA N/A 03/14/2022   Procedure: XI ROBOTIC ASSISTED VENTRAL HERNIA W/ MESH;  Surgeon: Lucretia Roers, MD;  Location: [facility];  Service: General;  Laterality: N/A;   Patient Active Problem List   Diagnosis Date Noted   CKD (chronic kidney disease) stage 3, GFR 30-59 ml/min (HCC) 09/24/2022   Umbilical hernia without obstruction and without gangrene 02/25/2022   GAD (generalized anxiety disorder) 08/01/2021   History of stroke 01/02/2020   Prediabetes 01/23/2017   Depression 10/10/2013   Hyperlipidemia 10/10/2013   Spastic hemiplegia affecting dominant side (HCC) 05/23/2013   Difficulty walking 03/08/2013   Right leg weakness 03/08/2013   Balance problems 03/08/2013   Weakness of right upper extremity 03/08/2013   Gout 08/05/2012   Essential hypertension 08/05/2012   REFERRING PROVIDER: Dettinger, Elige Radon, MD   REFERRING DIAG: Right lumbar pain, Strain of neck muscle, subsequent encounter   THERAPY DIAG:  Cervicalgia  Other low back pain  Muscle weakness (generalized)  Rationale for Evaluation and Treatment: Rehabilitation  ONSET DATE: 4-5 months (neck pain) and 3-4 weeks (low back pain)   SUBJECTIVE:                                                                                                                                                                                                          SUBJECTIVE STATEMENT: Pt reports 2/10 left neck pain today. LB is doing good  Hand dominance: Left  PERTINENT HISTORY:  Hypertension, spastic hemiplegia affecting dominant side (right side affected), chronic kidney disease, depression, history of a CVA, anxiety, and hypertension  CERVICAL PAIN:  Are you having pain? Yes: NPRS scale: 2/10 Pain location: left upper trapezius Pain description: intermittent throbbing and ache Aggravating factors: laying down,  pressure on his neck Relieving factors: prednisone   LUMBAR PAIN:  Are you having pain? Yes: NPRS scale: Current: 0/10 Best: 2/10 Worst: 7/10 Pain location: right low back/ hip Pain description: pressure and sore Aggravating factors: walking Relieving factors: prednisone  PRECAUTIONS: None  RED FLAGS: None     WEIGHT BEARING RESTRICTIONS: No  FALLS:  Has patient fallen in last 6 months?  No, but he has been unsteady since his stroke  LIVING ENVIRONMENT: Lives with: lives with their son Lives in: House/apartment Stairs: Yes: External: 3 steps; none Has following equipment at home: None  OCCUPATION: not working  PLOF: Independent  PATIENT GOALS: reduced pain, be able to lay on his left side, and walk longer  NEXT MD VISIT: 04/09/23  OBJECTIVE:  Note: Objective measures were completed at Evaluation unless otherwise noted.  PATIENT SURVEYS:  Modified Oswestry 46% disability  NDI 18% disability  COGNITION: Overall cognitive status: Within functional limits for tasks assessed  SENSATION: Patient reports no numbness or tingling  PALPATION: TTP: left upper trapezius, right lumbar paraspinals, QL, and IT band   CERVICAL ROM:   Active ROM A/PROM (deg) eval  Flexion 40  Extension 40  Right lateral flexion 24  Left lateral flexion 26; familiar pain  Right rotation 61  Left rotation 38; familiar neck pain   (Blank rows = not tested)  LUMBAR ROM: Unable  to be assessed due to time constraints  Active  A/PROM  eval  Flexion   Extension   Right lateral flexion   Left lateral flexion   Right rotation   Left rotation    (Blank rows = not tested)   UPPER EXTREMITY ROM: R UE limited due to previous CVA   UPPER EXTREMITY MMT: right UE unable to be assessed due to CVA   MMT Right eval Left eval  Shoulder flexion  4/5  Shoulder extension    Shoulder abduction  4-/5  Shoulder adduction    Shoulder extension    Shoulder internal rotation    Shoulder external rotation    Middle trapezius    Lower trapezius    Elbow flexion    Elbow extension    Wrist flexion    Wrist extension    Wrist ulnar deviation    Wrist radial deviation    Wrist pronation    Wrist supination    Grip strength     (Blank rows = not tested)  LOWER EXTREMITY MMT:    MMT Right eval Left eval  Hip flexion 4+/5 4+/5  Hip extension    Hip abduction    Hip adduction    Hip internal rotation    Hip external rotation    Knee flexion 3/5 5/5  Knee extension 4/5 4+/5  Ankle dorsiflexion    Ankle plantarflexion    Ankle inversion    Ankle eversion     (Blank rows = not tested)   TREATMENT DATE:  04/01/23                        EXERCISE LOG   LT side neck  Exercise Repetitions and Resistance Comments  LT UE row Blue x 30 reps   LT UE pull down Blue x 30 reps   L shoulder Shrug 7# x 25 reps        Blank cell = exercise not performed today   Manual Therapy Soft Tissue Mobilization: left cervical, STW/M to left cervical paraspinals, left upper trap, and left levator scapula to decrease pain and tone    Modalities  Date: seated Unattended Estim: Cervical, IFC 80-150 hz, 15 mins, Pain and Tone Hot Pack: Cervical, 15 mins, Pain and Tone                                    03/23/23 EXERCISE LOG  Exercise Repetitions and  Resistance Comments  L UT stretch  3 x 30 seconds    L levator scapulae stretch  3 x 30 seconds   L row Blue t-band x 30 reps   L pull down Blut t-band x 30 reps   L shoulder shrug 6# x 25 reps         Blank cell = exercise not performed today  Manual Therapy Soft Tissue Mobilization: L upper trapezius and levator scapulae, for reduced pain and tone     PATIENT EDUCATION:  Education details: Plan of care, prognosis, healing, anatomy, and goals for therapy Person educated: Patient Education method: Explanation Education comprehension: verbalized understanding  HOME EXERCISE PROGRAM: [id number]  ASSESSMENT:  CLINICAL IMPRESSION: Pt arrived for today's treatment session reporting 2/10 left side cervical pain.  Pt able to demonstrate 55 degrees of left cervical flexion prior to STW and 63 degrees of left cervical flexion after STW.  Pt able to score 4% on OSWESTRY, meeting his LTG.  Pt is making very good progress towards his goals at this time.  Pt declines combo today stating that he feels more relief from STW.  Normal responses to estim and MH noted upon removal.  Pt denied any pain at completion of today's treatment session.  04/01/23 PROGRESS REPORT:  Patient is making excellent progress with skilled physical therapy as evidenced by his objective measures, functional mobility, and progress towards his goals.  He was able to demonstrate a significant improvement in cervical rotation active range of motion and his Oswestry disability index.  However, he was unable to fully meet his long-term goal for cervical active range of motion at this time.  Recommend that he continue with his current plan of care to address his remaining impairments to return to his prior level of function.  Candi Leash, PT, DPT   OBJECTIVE IMPAIRMENTS: Abnormal gait, decreased activity tolerance, decreased mobility, difficulty walking, decreased ROM, decreased strength, hypomobility, impaired tone, impaired UE  functional use, and pain.   ACTIVITY LIMITATIONS: carrying, lifting, and locomotion level  PARTICIPATION LIMITATIONS: meal prep, cleaning, laundry, driving, shopping, and community activity  PERSONAL FACTORS: Past/current experiences, Time since onset of injury/illness/exacerbation, and 3+ comorbidities: Hypertension, spastic hemiplegia affecting dominant side (right side affected), chronic kidney disease, depression, history of a CVA, anxiety, and hypertension  are also affecting patient's functional outcome.   REHAB POTENTIAL: Good  CLINICAL DECISION MAKING: Evolving/moderate complexity  EVALUATION COMPLEXITY: Moderate   GOALS: Goals reviewed with patient? Yes  LONG TERM GOALS:  Target date: 04/16/23  Patient will be independent with his HEP. Baseline:  Goal status: IN PROGRESS  2.  Patient will report being able to complete his daily activities without his familiar pain exceeding 5/10. Baseline:  Goal status: MET  3.  Patient will report being able to sleep on his left side without being awakened by his familiar symptoms. Baseline: 1/22: "depends on the day" Goal status: IN PROGRESS  4.  Patient will improve his left cervical rotation to at least 60 degrees for improved awareness of his surroundings. Baseline: 1/22: 55 degrees Goal status: IN PROGRESS  5.  Patient will improve his modified Oswestry score to 36% or less for improved perceived function with his daily activities. Baseline: 1/22: 4% Goal status: met  PLAN:  PT FREQUENCY: 2x/week  PT DURATION: 4 weeks  PLANNED INTERVENTIONS: 97164- PT Re-evaluation, 97110-Therapeutic exercises, 97530- Therapeutic activity, 97112- Neuromuscular re-education, 97535- Self Care, 08657- Manual therapy, [phone]- Gait training, 97014- Electrical stimulation (unattended), [phone]- Ultrasound, Patient/Family education, Balance training, Stair training, Dry Needling, Joint mobilization, Spinal mobilization, Cryotherapy, and Moist  heat  PLAN FOR NEXT SESSION: NuStep, lumbar and lower extremity strengthening, upper trapezius stretch, manual therapy, and modalities as needed   Newman Pies, PTA 04/01/2023, 12:52 PM

## 2023-04-06 ENCOUNTER — Ambulatory Visit: Payer: Medicare Other

## 2023-04-06 DIAGNOSIS — M545 Low back pain, unspecified: Secondary | ICD-10-CM | POA: Diagnosis not present

## 2023-04-06 DIAGNOSIS — S161XXD Strain of muscle, fascia and tendon at neck level, subsequent encounter: Secondary | ICD-10-CM | POA: Diagnosis not present

## 2023-04-06 DIAGNOSIS — M6281 Muscle weakness (generalized): Secondary | ICD-10-CM | POA: Diagnosis not present

## 2023-04-06 DIAGNOSIS — M542 Cervicalgia: Secondary | ICD-10-CM

## 2023-04-06 DIAGNOSIS — M5459 Other low back pain: Secondary | ICD-10-CM

## 2023-04-06 NOTE — Therapy (Signed)
OUTPATIENT PHYSICAL THERAPY CERVICAL TREATMENT   Patient Name: Bruce Guerrero MRN: [id number] DOB:1960/07/03, 63 y.o., male Today's Date: 04/06/2023  END OF SESSION:  PT End of Session - 04/06/23 1102     Visit Number 6    Number of Visits 8    Date for PT Re-Evaluation 06/05/23    PT Start Time 1101    PT Stop Time 1146    PT Time Calculation (min) 45 min    Activity Tolerance Patient tolerated treatment well    Behavior During Therapy [facility] for tasks assessed/performed               Past Medical History:  Diagnosis Date   Gout    Hyperlipidemia    Hypertension    Stroke (HCC) 2014   Past Surgical History:  Procedure Laterality Date   HERNIA REPAIR  03/14/2022   NO PAST SURGERIES     XI ROBOTIC ASSISTED VENTRAL HERNIA N/A 03/14/2022   Procedure: XI ROBOTIC ASSISTED VENTRAL HERNIA W/ MESH;  Surgeon: Lucretia Roers, MD;  Location: [facility];  Service: General;  Laterality: N/A;   Patient Active Problem List   Diagnosis Date Noted   CKD (chronic kidney disease) stage 3, GFR 30-59 ml/min (HCC) 09/24/2022   Umbilical hernia without obstruction and without gangrene 02/25/2022   GAD (generalized anxiety disorder) 08/01/2021   History of stroke 01/02/2020   Prediabetes 01/23/2017   Depression 10/10/2013   Hyperlipidemia 10/10/2013   Spastic hemiplegia affecting dominant side (HCC) 05/23/2013   Difficulty walking 03/08/2013   Right leg weakness 03/08/2013   Balance problems 03/08/2013   Weakness of right upper extremity 03/08/2013   Gout 08/05/2012   Essential hypertension 08/05/2012   REFERRING PROVIDER: Dettinger, Elige Radon, MD   REFERRING DIAG: Right lumbar pain, Strain of neck muscle, subsequent encounter   THERAPY DIAG:  Cervicalgia  Other low back pain  Muscle weakness (generalized)  Rationale for Evaluation and Treatment: Rehabilitation  ONSET DATE: 4-5 months (neck pain) and 3-4 weeks (low back pain)   SUBJECTIVE:                                                                                                                                                                                                          SUBJECTIVE STATEMENT: Patient reports that he feels good today. He notes that his neck is "maybe a 1/10." Hand dominance: Left  PERTINENT HISTORY:  Hypertension, spastic hemiplegia affecting dominant side (right side affected), chronic kidney disease, depression, history of a CVA, anxiety, and hypertension  CERVICAL PAIN:  Are you having pain? Yes: NPRS scale: 1/10 Pain location: left upper trapezius Pain description: intermittent throbbing and ache Aggravating factors: laying down, pressure on his neck Relieving factors: prednisone   LUMBAR PAIN:  Are you having pain? Yes: NPRS scale: Current: 0/10 Best: 2/10 Worst: 7/10 Pain location: right low back/ hip Pain description: pressure and sore Aggravating factors: walking Relieving factors: prednisone  PRECAUTIONS: None  RED FLAGS: None     WEIGHT BEARING RESTRICTIONS: No  FALLS:  Has patient fallen in last 6 months?  No, but he has been unsteady since his stroke  LIVING ENVIRONMENT: Lives with: lives with their son Lives in: House/apartment Stairs: Yes: External: 3 steps; none Has following equipment at home: None  OCCUPATION: not working  PLOF: Independent  PATIENT GOALS: reduced pain, be able to lay on his left side, and walk longer  NEXT MD VISIT: 04/09/23  OBJECTIVE:  Note: Objective measures were completed at Evaluation unless otherwise noted.  PATIENT SURVEYS:  Modified Oswestry 46% disability  NDI 18% disability  COGNITION: Overall cognitive status: Within functional limits for tasks assessed  SENSATION: Patient reports no numbness or tingling  PALPATION: TTP: left upper trapezius, right lumbar paraspinals, QL, and IT band   CERVICAL ROM:   Active ROM A/PROM (deg) eval  Flexion 40  Extension 40  Right lateral flexion 24  Left  lateral flexion 26; familiar pain  Right rotation 61  Left rotation 38; familiar neck pain   (Blank rows = not tested)  LUMBAR ROM: Unable to be assessed due to time constraints  Active  A/PROM  eval  Flexion   Extension   Right lateral flexion   Left lateral flexion   Right rotation   Left rotation    (Blank rows = not tested)   UPPER EXTREMITY ROM: R UE limited due to previous CVA   UPPER EXTREMITY MMT: right UE unable to be assessed due to CVA   MMT Right eval Left eval  Shoulder flexion  4/5  Shoulder extension    Shoulder abduction  4-/5  Shoulder adduction    Shoulder extension    Shoulder internal rotation    Shoulder external rotation    Middle trapezius    Lower trapezius    Elbow flexion    Elbow extension    Wrist flexion    Wrist extension    Wrist ulnar deviation    Wrist radial deviation    Wrist pronation    Wrist supination    Grip strength     (Blank rows = not tested)  LOWER EXTREMITY MMT:    MMT Right eval Left eval  Hip flexion 4+/5 4+/5  Hip extension    Hip abduction    Hip adduction    Hip internal rotation    Hip external rotation    Knee flexion 3/5 5/5  Knee extension 4/5 4+/5  Ankle dorsiflexion    Ankle plantarflexion    Ankle inversion    Ankle eversion     (Blank rows = not tested)   TREATMENT DATE:  04/06/23 EXERCISE LOG  Exercise Repetitions and Resistance Comments  L levator scapulae stretch  3 x 30 seconds    Scapular circles 20 reps each    L shoulder shrug 8# x 25 reps             Blank cell = exercise not performed today  Manual Therapy Soft Tissue Mobilization: left upper trapezius, levator scapulae, and cervical paraspinals, for reduced pain and tone Joint Mobilizations: C4-7, grade I-III   Modalities: no redness or adverse reaction to today's  modalities  Date:  Unattended Estim: left upper trapezius, pre mod @ 80-150 Hz, 15 mins, Tone Hot Pack: Cervical, 15 mins, Tone       04/01/23                        EXERCISE LOG   LT side neck  Exercise Repetitions and Resistance Comments  LT UE row Blue x 30 reps   LT UE pull down Blue x 30 reps   L shoulder Shrug 7# x 25 reps        Blank cell = exercise not performed today   Manual Therapy Soft Tissue Mobilization: left cervical, STW/M to left cervical paraspinals, left upper trap, and left levator scapula to decrease pain and tone    Modalities  Date: seated Unattended Estim: Cervical, IFC 80-150 hz, 15 mins, Pain and Tone Hot Pack: Cervical, 15 mins, Pain and Tone                                    03/23/23 EXERCISE LOG  Exercise Repetitions and Resistance Comments  L UT stretch  3 x 30 seconds    L levator scapulae stretch  3 x 30 seconds   L row Blue t-band x 30 reps   L pull down Blut t-band x 30 reps   L shoulder shrug 6# x 25 reps         Blank cell = exercise not performed today  Manual Therapy Soft Tissue Mobilization: L upper trapezius and levator scapulae, for reduced pain and tone     PATIENT EDUCATION:  Education details: Plan of care, prognosis, healing, anatomy, and goals for therapy Person educated: Patient Education method: Explanation Education comprehension: verbalized understanding  HOME EXERCISE PROGRAM: [id number]  ASSESSMENT:  CLINICAL IMPRESSION: Today's treatment was initiated with manual therapy for reduced pain and tone with soft tissue mobilization to his left upper trapezius and levator scapulae being the most effective as this was able to resolve his familiar symptoms. This was followed by appropriately matched interventions for appropriate upper trapezius and periscapular engagement. He reported that his neck felt good upon the conclusion of treatment. He continues to require skilled physical therapy to address his remaining impairments  to return to his prior level of function.   OBJECTIVE IMPAIRMENTS: Abnormal gait, decreased activity tolerance, decreased mobility, difficulty walking, decreased ROM, decreased strength, hypomobility, impaired tone, impaired UE functional use, and pain.   ACTIVITY LIMITATIONS: carrying, lifting, and locomotion level  PARTICIPATION LIMITATIONS: meal prep, cleaning, laundry, driving, shopping, and community activity  PERSONAL FACTORS: Past/current experiences, Time since onset of injury/illness/exacerbation, and 3+ comorbidities: Hypertension, spastic hemiplegia affecting dominant side (right side affected), chronic kidney disease, depression, history of a CVA, anxiety, and hypertension  are also affecting patient's functional outcome.   REHAB POTENTIAL: Good  CLINICAL DECISION MAKING: Evolving/moderate complexity  EVALUATION COMPLEXITY: Moderate  GOALS: Goals reviewed with patient? Yes  LONG TERM GOALS: Target date: 04/16/23  Patient will be independent with his HEP. Baseline:  Goal status: IN PROGRESS  2.  Patient will report being able to complete his daily activities without his familiar pain exceeding 5/10. Baseline:  Goal status: MET  3.  Patient will report being able to sleep on his left side without being awakened by his familiar symptoms. Baseline: 1/22: "depends on the day" Goal status: IN PROGRESS  4.  Patient will improve his left cervical rotation to at least 60 degrees for improved awareness of his surroundings. Baseline: 1/22: 55 degrees Goal status: IN PROGRESS  5.  Patient will improve his modified Oswestry score to 36% or less for improved perceived function with his daily activities. Baseline: 1/22: 4% Goal status: met  PLAN:  PT FREQUENCY: 2x/week  PT DURATION: 4 weeks  PLANNED INTERVENTIONS: 97164- PT Re-evaluation, 97110-Therapeutic exercises, 97530- Therapeutic activity, 97112- Neuromuscular re-education, 97535- Self Care, 78295- Manual therapy,  97116- Gait training, 97014- Electrical stimulation (unattended), 97035- Ultrasound, Patient/Family education, Balance training, Stair training, Dry Needling, Joint mobilization, Spinal mobilization, Cryotherapy, and Moist heat  PLAN FOR NEXT SESSION: NuStep, lumbar and lower extremity strengthening, upper trapezius stretch, manual therapy, and modalities as needed   Granville Lewis, PT 04/06/2023, 12:02 PM

## 2023-04-08 ENCOUNTER — Encounter: Payer: Medicare Other | Admitting: Physical Therapy

## 2023-04-09 ENCOUNTER — Ambulatory Visit (INDEPENDENT_AMBULATORY_CARE_PROVIDER_SITE_OTHER): Payer: Medicare Other | Admitting: Family Medicine

## 2023-04-09 ENCOUNTER — Encounter: Payer: Self-pay | Admitting: Family Medicine

## 2023-04-09 VITALS — BP 124/69 | HR 59 | Ht 64.0 in | Wt 182.0 lb

## 2023-04-09 DIAGNOSIS — M654 Radial styloid tenosynovitis [de Quervain]: Secondary | ICD-10-CM

## 2023-04-09 DIAGNOSIS — Z23 Encounter for immunization: Secondary | ICD-10-CM

## 2023-04-09 DIAGNOSIS — E1169 Type 2 diabetes mellitus with other specified complication: Secondary | ICD-10-CM

## 2023-04-09 DIAGNOSIS — F3341 Major depressive disorder, recurrent, in partial remission: Secondary | ICD-10-CM

## 2023-04-09 DIAGNOSIS — B351 Tinea unguium: Secondary | ICD-10-CM

## 2023-04-09 DIAGNOSIS — R7303 Prediabetes: Secondary | ICD-10-CM | POA: Diagnosis not present

## 2023-04-09 DIAGNOSIS — Z8673 Personal history of transient ischemic attack (TIA), and cerebral infarction without residual deficits: Secondary | ICD-10-CM

## 2023-04-09 DIAGNOSIS — N1831 Chronic kidney disease, stage 3a: Secondary | ICD-10-CM

## 2023-04-09 DIAGNOSIS — E782 Mixed hyperlipidemia: Secondary | ICD-10-CM | POA: Diagnosis not present

## 2023-04-09 DIAGNOSIS — F411 Generalized anxiety disorder: Secondary | ICD-10-CM

## 2023-04-09 DIAGNOSIS — I10 Essential (primary) hypertension: Secondary | ICD-10-CM

## 2023-04-09 DIAGNOSIS — M1A0420 Idiopathic chronic gout, left hand, without tophus (tophi): Secondary | ICD-10-CM

## 2023-04-09 LAB — BAYER DCA HB A1C WAIVED: HB A1C (BAYER DCA - WAIVED): 6.2 % — ABNORMAL HIGH (ref 4.8–5.6)

## 2023-04-09 MED ORDER — ALLOPURINOL 300 MG PO TABS: 300.0000 mg | ORAL_TABLET | Freq: Every day | ORAL | 3 refills | Status: AC

## 2023-04-09 MED ORDER — CARVEDILOL 6.25 MG PO TABS: 6.2500 mg | ORAL_TABLET | Freq: Two times a day (BID) | ORAL | 3 refills | Status: AC

## 2023-04-09 MED ORDER — SERTRALINE HCL 50 MG PO TABS: 50.0000 mg | ORAL_TABLET | Freq: Every day | ORAL | 3 refills | Status: AC

## 2023-04-09 MED ORDER — DICLOFENAC SODIUM 75 MG PO TBEC: 75.0000 mg | DELAYED_RELEASE_TABLET | Freq: Two times a day (BID) | ORAL | 3 refills | Status: AC

## 2023-04-09 MED ORDER — TERBINAFINE HCL 250 MG PO TABS: 250.0000 mg | ORAL_TABLET | Freq: Every day | ORAL | 3 refills | Status: AC

## 2023-04-09 MED ORDER — AMLODIPINE BESYLATE 10 MG PO TABS: 10.0000 mg | ORAL_TABLET | Freq: Every day | ORAL | 3 refills | Status: AC

## 2023-04-09 MED ORDER — ATORVASTATIN CALCIUM 80 MG PO TABS: ORAL_TABLET | ORAL | 3 refills | Status: DC

## 2023-04-09 NOTE — Progress Notes (Signed)
BP 124/69   Pulse (!) 59   Ht 5\' 4"  (1.626 m)   Wt 182 lb (82.6 kg)   SpO2 96%   BMI 31.24 kg/m    Subjective:   Patient ID: Bruce Guerrero, male    DOB: 1961-02-10, 63 y.o.   MRN: [id number]  HPI: Bruce Guerrero is a 63 y.o. male presenting on 04/09/2023 for Medical Management of Chronic Issues, Diabetes, Hyperlipidemia, and Hypertension  Prediabetes Patient comes in today for recheck of his prediabetes. He is not currently taking any medications - diet control. Patient saw an ophthalmologist 3-4 months ago. Patient denies any new issues with their feet. His diabetes is complicated by CKD.   Hypertension/CKD 3 Patient is currently taking amlodipine and carvedilol. His blood pressure today is 124/69. Blood pressure at home averages around 120/70s in the morning and 145-150/70 in the evening. He denies lightheadedness or dizziness, vision changes, chest pain, or shortness of breath. He was having headaches and elevated blood pressures in the 180/80s when taking prednisone, but this has since resolved.  Hyperlipidemia Patient is currently taking atorvastatin. He denies myalgias or weakness. He does not have a history of liver damage from it.   Patient also reports a 4-day history of epigastric pain when standing from a seated position or using the restroom. He was moving water cases 5 days ago but does not recall any specific injuries. He describes the pain as stinging, but it resolves within seconds and does not occur at rest.  Relevant past medical, surgical, family and social history reviewed and updated as indicated. Interim medical history since our last visit reviewed. Allergies and medications reviewed and updated.  Review of Systems  Constitutional:  Negative for chills and fever.  HENT:  Negative for congestion and sore throat.   Eyes:  Negative for visual disturbance.  Respiratory:  Negative for chest tightness and shortness of breath.   Cardiovascular:  Positive  for leg swelling (controlled with compression stockings). Negative for chest pain.  Gastrointestinal:  Negative for abdominal distention, abdominal pain, constipation and diarrhea.  Genitourinary:  Negative for difficulty urinating, dysuria and hematuria.  Musculoskeletal:  Negative for myalgias.       Muscular pain in epigastric region with standing and using the restroom  Skin:  Negative for wound.  Neurological:  Positive for headaches (headaches on prednisone, now resolved). Negative for dizziness, syncope, light-headedness and numbness.    Per HPI unless specifically indicated above   Allergies as of 04/09/2023   No Known Allergies      Medication List        Accurate as of April 09, 2023  9:06 AM. If you have any questions, ask your nurse or doctor.          STOP taking these medications    predniSONE 20 MG tablet Commonly known as: DELTASONE Stopped by: Elige Radon Canaan Holzer       TAKE these medications    allopurinol 300 MG tablet Commonly known as: ZYLOPRIM Take 1 tablet (300 mg total) by mouth daily.   amLODipine 10 MG tablet Commonly known as: NORVASC Take 1 tablet (10 mg total) by mouth daily.   atorvastatin 80 MG tablet Commonly known as: LIPITOR TAKE 1 TABLET BY MOUTH EVERY DAY AT 6 PM   baclofen 10 MG tablet Commonly known as: LIORESAL Take 1 tablet (10 mg total) by mouth 3 (three) times daily.   carvedilol 6.25 MG tablet Commonly known as: COREG Take 1 tablet (6.25 mg  total) by mouth 2 (two) times daily with a meal.   Cinnamon 500 MG capsule Take 1,000 mg by mouth daily.   clopidogrel 75 MG tablet Commonly known as: PLAVIX Take 1 tablet (75 mg total) by mouth daily.   diclofenac 75 MG EC tablet Commonly known as: VOLTAREN Take 1 tablet (75 mg total) by mouth 2 (two) times daily.   ferrous sulfate 325 (65 FE) MG tablet Take 325 mg by mouth daily with breakfast.   sertraline 50 MG tablet Commonly known as: ZOLOFT Take 1 tablet (50  mg total) by mouth daily.   terbinafine 250 MG tablet Commonly known as: LAMISIL Take 1 tablet (250 mg total) by mouth daily.         Objective:   BP 124/69   Pulse (!) 59   Ht 5\' 4"  (1.626 m)   Wt 182 lb (82.6 kg)   SpO2 96%   BMI 31.24 kg/m   Wt Readings from Last 3 Encounters:  04/09/23 182 lb (82.6 kg)  03/13/23 187 lb (84.8 kg)  12/26/22 189 lb (85.7 kg)    Physical Exam Vitals and nursing note reviewed.  Constitutional:      Appearance: Normal appearance. He is obese.  HENT:     Head: Normocephalic and atraumatic.     Right Ear: External ear normal.     Left Ear: External ear normal.  Eyes:     Conjunctiva/sclera: Conjunctivae normal.  Cardiovascular:     Rate and Rhythm: Regular rhythm. Bradycardia present.     Heart sounds: Normal heart sounds.  Pulmonary:     Effort: Pulmonary effort is normal.     Breath sounds: Normal breath sounds. No wheezing or rales.  Abdominal:     General: Abdomen is flat. There is no distension.     Palpations: Abdomen is soft.     Tenderness: There is no abdominal tenderness.  Musculoskeletal:     Cervical back: Normal range of motion and neck supple. No tenderness.     Right lower leg: No edema.     Left lower leg: No edema.  Skin:    General: Skin is warm and dry.  Neurological:     Mental Status: He is alert and oriented to person, place, and time.     Sensory: No sensory deficit (sensation intact on 9-point diabetic foot exam).     Motor: Weakness (residual right-sided arm and leg weakness from stroke) present.     Gait: Gait abnormal.  Psychiatric:        Mood and Affect: Mood normal.        Behavior: Behavior normal.        Thought Content: Thought content normal.        Judgment: Judgment normal.    Assessment & Plan:   Problem List Items Addressed This Visit       Cardiovascular and Mediastinum   Essential hypertension   Relevant Medications   atorvastatin (LIPITOR) 80 MG tablet   amLODipine (NORVASC)  10 MG tablet   carvedilol (COREG) 6.25 MG tablet   Other Relevant Orders   CBC with Differential/Platelet   CMP14+EGFR   Lipid panel   Bayer DCA Hb A1c Waived     Genitourinary   CKD (chronic kidney disease) stage 3, GFR 30-59 ml/min (HCC)   Relevant Orders   CBC with Differential/Platelet   CMP14+EGFR   Lipid panel   Bayer DCA Hb A1c Waived     Other   Gout   Relevant  Medications   allopurinol (ZYLOPRIM) 300 MG tablet   Depression   Relevant Medications   sertraline (ZOLOFT) 50 MG tablet   Hyperlipidemia   Relevant Medications   atorvastatin (LIPITOR) 80 MG tablet   amLODipine (NORVASC) 10 MG tablet   carvedilol (COREG) 6.25 MG tablet   Other Relevant Orders   CBC with Differential/Platelet   CMP14+EGFR   Lipid panel   Bayer DCA Hb A1c Waived   Prediabetes - Primary   Relevant Medications   atorvastatin (LIPITOR) 80 MG tablet   Other Relevant Orders   CBC with Differential/Platelet   CMP14+EGFR   Lipid panel   Bayer DCA Hb A1c Waived   History of stroke   Relevant Medications   atorvastatin (LIPITOR) 80 MG tablet   GAD (generalized anxiety disorder)   Relevant Medications   sertraline (ZOLOFT) 50 MG tablet   Other Visit Diagnoses       Type 2 diabetes mellitus with other specified complication, without long-term current use of insulin (HCC)       Relevant Medications   atorvastatin (LIPITOR) 80 MG tablet     De Quervain's disease (tenosynovitis)       Relevant Medications   diclofenac (VOLTAREN) 75 MG EC tablet     Onychomycosis       Relevant Medications   terbinafine (LAMISIL) 250 MG tablet     Encounter for immunization       Relevant Orders   Pneumococcal conjugate vaccine 20-valent (Completed)       Blood pressure well-controlled at 124/69. HgbA1c slightly increased from last visit at 6.2%. Encouraged patient to focus on diet and exercise, though suspect course of prednisone contributed to this increase. Patient is tolerating atorvastatin well.  Will recheck lipid panel today. Epigastric pain seems consistent with an abdominal muscle strain for lifting water cases. Recommended conservative management with activity as tolerated and acetaminophen as needed.  Follow up plan: Return in about 3 months (around 07/08/2023), or if symptoms worsen or fail to improve, for prediabetes and htn and hld.  Counseling provided for all of the vaccine components Orders Placed This Encounter  Procedures   Pneumococcal conjugate vaccine 20-valent   CBC with Differential/Platelet   CMP14+EGFR   Lipid panel   Bayer DCA Hb A1c Waived    Gillermina Phy, Medical Student [facility] 04/09/2023, 9:06 AM  I was personally present for all components of the history, physical exam and/or medical decision making.  I agree with the documentation performed by the student and agree with assessment and plan above.  Arville Care, MD [facility] 04/10/2023, 12:43 PM

## 2023-04-10 LAB — CMP14+EGFR
ALT: 26 [IU]/L (ref 0–44)
AST: 20 [IU]/L (ref 0–40)
Albumin: 4.2 g/dL (ref 3.9–4.9)
Alkaline Phosphatase: 107 [IU]/L (ref 44–121)
BUN/Creatinine Ratio: 14 (ref 10–24)
BUN: 19 mg/dL (ref 8–27)
Bilirubin Total: 0.3 mg/dL (ref 0.0–1.2)
CO2: 24 mmol/L (ref 20–29)
Calcium: 9.1 mg/dL (ref 8.6–10.2)
Chloride: 102 mmol/L (ref 96–106)
Creatinine, Ser: 1.36 mg/dL — ABNORMAL HIGH (ref 0.76–1.27)
Globulin, Total: 2 g/dL (ref 1.5–4.5)
Glucose: 140 mg/dL — ABNORMAL HIGH (ref 70–99)
Potassium: 4.1 mmol/L (ref 3.5–5.2)
Sodium: 140 mmol/L (ref 134–144)
Total Protein: 6.2 g/dL (ref 6.0–8.5)
eGFR: 59 mL/min/{1.73_m2} — ABNORMAL LOW (ref >59)

## 2023-04-10 LAB — CBC WITH DIFFERENTIAL/PLATELET
Basophils Absolute: 0 10*3/uL (ref 0.0–0.2)
Basos: 1 %
EOS (ABSOLUTE): 0.1 10*3/uL (ref 0.0–0.4)
Eos: 2 %
Hematocrit: 42.2 % (ref 37.5–51.0)
Hemoglobin: 14 g/dL (ref 13.0–17.7)
Immature Grans (Abs): 0 10*3/uL (ref 0.0–0.1)
Immature Granulocytes: 0 %
Lymphocytes Absolute: 1.5 10*3/uL (ref 0.7–3.1)
Lymphs: 27 %
MCH: 31.5 pg (ref 26.6–33.0)
MCHC: 33.2 g/dL (ref 31.5–35.7)
MCV: 95 fL (ref 79–97)
Monocytes Absolute: 0.4 10*3/uL (ref 0.1–0.9)
Monocytes: 8 %
Neutrophils Absolute: 3.6 10*3/uL (ref 1.4–7.0)
Neutrophils: 62 %
Platelets: 195 10*3/uL (ref 150–450)
RBC: 4.45 x10E6/uL (ref 4.14–5.80)
RDW: 12.8 % (ref 11.6–15.4)
WBC: 5.7 10*3/uL (ref 3.4–10.8)

## 2023-04-10 LAB — LIPID PANEL
Chol/HDL Ratio: 3.1 {ratio} (ref 0.0–5.0)
Cholesterol, Total: 106 mg/dL (ref 100–199)
HDL: 34 mg/dL — ABNORMAL LOW (ref >39)
LDL Chol Calc (NIH): 51 mg/dL (ref 0–99)
Triglycerides: 111 mg/dL (ref 0–149)
VLDL Cholesterol Cal: 21 mg/dL (ref 5–40)

## 2023-04-11 DIAGNOSIS — E119 Type 2 diabetes mellitus without complications: Secondary | ICD-10-CM | POA: Diagnosis not present

## 2023-04-13 ENCOUNTER — Ambulatory Visit: Payer: Medicare Other | Attending: Family Medicine

## 2023-04-13 DIAGNOSIS — M5459 Other low back pain: Secondary | ICD-10-CM | POA: Diagnosis not present

## 2023-04-13 DIAGNOSIS — M6281 Muscle weakness (generalized): Secondary | ICD-10-CM | POA: Diagnosis not present

## 2023-04-13 DIAGNOSIS — M542 Cervicalgia: Secondary | ICD-10-CM | POA: Insufficient documentation

## 2023-04-13 NOTE — Therapy (Signed)
OUTPATIENT PHYSICAL THERAPY CERVICAL TREATMENT   Patient Name: Bruce Guerrero MRN: [id number] DOB:November 25, 1960, 63 y.o., male Today's Date: 04/13/2023  END OF SESSION:  PT End of Session - 04/13/23 1103     Visit Number 7    Number of Visits 8    Date for PT Re-Evaluation 06/05/23    PT Start Time 1100    PT Stop Time 1152    PT Time Calculation (min) 52 min    Activity Tolerance Patient tolerated treatment well    Behavior During Therapy WFL for tasks assessed/performed                Past Medical History:  Diagnosis Date   Gout    Hyperlipidemia    Hypertension    Stroke (HCC) 2014   Past Surgical History:  Procedure Laterality Date   HERNIA REPAIR  03/14/2022   NO PAST SURGERIES     XI ROBOTIC ASSISTED VENTRAL HERNIA N/A 03/14/2022   Procedure: XI ROBOTIC ASSISTED VENTRAL HERNIA W/ MESH;  Surgeon: Lucretia Roers, MD;  Location: [facility];  Service: General;  Laterality: N/A;   Patient Active Problem List   Diagnosis Date Noted   CKD (chronic kidney disease) stage 3, GFR 30-59 ml/min (HCC) 09/24/2022   Umbilical hernia without obstruction and without gangrene 02/25/2022   GAD (generalized anxiety disorder) 08/01/2021   History of stroke 01/02/2020   Prediabetes 01/23/2017   Depression 10/10/2013   Hyperlipidemia 10/10/2013   Spastic hemiplegia affecting dominant side (HCC) 05/23/2013   Difficulty walking 03/08/2013   Right leg weakness 03/08/2013   Balance problems 03/08/2013   Weakness of right upper extremity 03/08/2013   Gout 08/05/2012   Essential hypertension 08/05/2012   REFERRING PROVIDER: Dettinger, Elige Radon, MD   REFERRING DIAG: Right lumbar pain, Strain of neck muscle, subsequent encounter   THERAPY DIAG:  Cervicalgia  Other low back pain  Muscle weakness (generalized)  Rationale for Evaluation and Treatment: Rehabilitation  ONSET DATE: 4-5 months (neck pain) and 3-4 weeks (low back pain)   SUBJECTIVE:                                                                                                                                                                                                          SUBJECTIVE STATEMENT: Patient reports that he began having more neck pain and stiffness on Friday with no known cause. His HEP helped reduce his pain some. Hand dominance: Left  PERTINENT HISTORY:  Hypertension, spastic hemiplegia affecting dominant side (right side affected), chronic kidney disease, depression, history  of a CVA, anxiety, and hypertension  CERVICAL PAIN:  Are you having pain? Yes: NPRS scale: 2/10 Pain location: left upper trapezius Pain description: intermittent throbbing and ache Aggravating factors: laying down, pressure on his neck Relieving factors: prednisone   LUMBAR PAIN:  Are you having pain? Yes: NPRS scale: Current: 0/10 Best: 2/10 Worst: 7/10 Pain location: right low back/ hip Pain description: pressure and sore Aggravating factors: walking Relieving factors: prednisone  PRECAUTIONS: None  RED FLAGS: None     WEIGHT BEARING RESTRICTIONS: No  FALLS:  Has patient fallen in last 6 months?  No, but he has been unsteady since his stroke  LIVING ENVIRONMENT: Lives with: lives with their son Lives in: House/apartment Stairs: Yes: External: 3 steps; none Has following equipment at home: None  OCCUPATION: not working  PLOF: Independent  PATIENT GOALS: reduced pain, be able to lay on his left side, and walk longer  NEXT MD VISIT: 04/09/23  OBJECTIVE:  Note: Objective measures were completed at Evaluation unless otherwise noted.  PATIENT SURVEYS:  Modified Oswestry 46% disability  NDI 18% disability  COGNITION: Overall cognitive status: Within functional limits for tasks assessed  SENSATION: Patient reports no numbness or tingling  PALPATION: TTP: left upper trapezius, right lumbar paraspinals, QL, and IT band   CERVICAL ROM:   Active ROM A/PROM (deg) eval  Flexion  40  Extension 40  Right lateral flexion 24  Left lateral flexion 26; familiar pain  Right rotation 61  Left rotation 38; familiar neck pain   (Blank rows = not tested)  LUMBAR ROM: Unable to be assessed due to time constraints  Active  A/PROM  eval  Flexion   Extension   Right lateral flexion   Left lateral flexion   Right rotation   Left rotation    (Blank rows = not tested)   UPPER EXTREMITY ROM: R UE limited due to previous CVA   UPPER EXTREMITY MMT: right UE unable to be assessed due to CVA   MMT Right eval Left eval  Shoulder flexion  4/5  Shoulder extension    Shoulder abduction  4-/5  Shoulder adduction    Shoulder extension    Shoulder internal rotation    Shoulder external rotation    Middle trapezius    Lower trapezius    Elbow flexion    Elbow extension    Wrist flexion    Wrist extension    Wrist ulnar deviation    Wrist radial deviation    Wrist pronation    Wrist supination    Grip strength     (Blank rows = not tested)  LOWER EXTREMITY MMT:    MMT Right eval Left eval  Hip flexion 4+/5 4+/5  Hip extension    Hip abduction    Hip adduction    Hip internal rotation    Hip external rotation    Knee flexion 3/5 5/5  Knee extension 4/5 4+/5  Ankle dorsiflexion    Ankle plantarflexion    Ankle inversion    Ankle eversion     (Blank rows = not tested)   TREATMENT DATE:  04/13/23 EXERCISE LOG  Exercise Repetitions and Resistance Comments  Manual therapy  See below   Cervical circles 5 reps each    L upper trapezius stretch  6 x 30 seconds   L levator scapulae stretch  4 x 30 seconds   L cervical rotation with self overpressure 2 minutes   Scapular retraction with elevation and depression 10 reps  For postural reeducation  Isometric ball press 3 minutes w/ 5 second hold    Blank cell  = exercise not performed today  Manual Therapy Soft Tissue Mobilization: left upper trapezius and levator scapulae, for reduced pain and tone   Modalities: no redness or adverse reaction to today's modalities  Date:  Unattended Estim: left upper trapezius, pre mod @ 80-150 Hz, 15 mins, Pain and Tone Hot Pack: Cervical, 15 mins, Pain and Tone                                   04/06/23 EXERCISE LOG  Exercise Repetitions and Resistance Comments  L levator scapulae stretch  3 x 30 seconds    Scapular circles 20 reps each    L shoulder shrug 8# x 25 reps             Blank cell = exercise not performed today  Manual Therapy Soft Tissue Mobilization: left upper trapezius, levator scapulae, and cervical paraspinals, for reduced pain and tone Joint Mobilizations: C4-7, grade I-III   Modalities: no redness or adverse reaction to today's modalities  Date:  Unattended Estim: left upper trapezius, pre mod @ 80-150 Hz, 15 mins, Tone Hot Pack: Cervical, 15 mins, Tone       04/01/23                        EXERCISE LOG   LT side neck  Exercise Repetitions and Resistance Comments  LT UE row Blue x 30 reps   LT UE pull down Blue x 30 reps   L shoulder Shrug 7# x 25 reps        Blank cell = exercise not performed today   Manual Therapy Soft Tissue Mobilization: left cervical, STW/M to left cervical paraspinals, left upper trap, and left levator scapula to decrease pain and tone    Modalities  Date: seated Unattended Estim: Cervical, IFC 80-150 hz, 15 mins, Pain and Tone Hot Pack: Cervical, 15 mins, Pain and Tone   PATIENT EDUCATION:  Education details: Plan of care, prognosis, healing, anatomy, and goals for therapy Person educated: Patient Education method: Explanation Education comprehension: verbalized understanding  HOME EXERCISE PROGRAM: [id number]  ASSESSMENT:  CLINICAL IMPRESSION: Patient was introduced to an isometric ball press to facilitate periscapular engagement. He  required minimal cueing with this intervention for proper exercise performance to isolate periscapular engagement. Manual therapy focused on soft tissue mobilization to his left upper trapezius with this being the most effective at reducing his familiar symptoms. He experienced no increase in his familiar symptoms with any of today's interventions. He reported that his neck felt better upon the conclusion of treatment. He continues to require skilled physical therapy to address his remaining impairments to return to his prior level of function.   OBJECTIVE IMPAIRMENTS: Abnormal gait, decreased activity tolerance, decreased mobility, difficulty walking, decreased ROM, decreased strength, hypomobility, impaired tone, impaired UE functional use, and pain.   ACTIVITY LIMITATIONS: carrying, lifting, and locomotion level  PARTICIPATION LIMITATIONS: meal prep,  cleaning, laundry, driving, shopping, and community activity  PERSONAL FACTORS: Past/current experiences, Time since onset of injury/illness/exacerbation, and 3+ comorbidities: Hypertension, spastic hemiplegia affecting dominant side (right side affected), chronic kidney disease, depression, history of a CVA, anxiety, and hypertension  are also affecting patient's functional outcome.   REHAB POTENTIAL: Good  CLINICAL DECISION MAKING: Evolving/moderate complexity  EVALUATION COMPLEXITY: Moderate   GOALS: Goals reviewed with patient? Yes  LONG TERM GOALS: Target date: 04/16/23  Patient will be independent with his HEP. Baseline:  Goal status: IN PROGRESS  2.  Patient will report being able to complete his daily activities without his familiar pain exceeding 5/10. Baseline:  Goal status: MET  3.  Patient will report being able to sleep on his left side without being awakened by his familiar symptoms. Baseline: 1/22: "depends on the day" Goal status: IN PROGRESS  4.  Patient will improve his left cervical rotation to at least 60 degrees  for improved awareness of his surroundings. Baseline: 1/22: 55 degrees Goal status: IN PROGRESS  5.  Patient will improve his modified Oswestry score to 36% or less for improved perceived function with his daily activities. Baseline: 1/22: 4% Goal status: met  PLAN:  PT FREQUENCY: 2x/week  PT DURATION: 4 weeks  PLANNED INTERVENTIONS: 97164- PT Re-evaluation, 97110-Therapeutic exercises, 97530- Therapeutic activity, 97112- Neuromuscular re-education, 97535- Self Care, [id number]- Manual therapy, 97116- Gait training, 97014- Electrical stimulation (unattended), [id number]- Ultrasound, Patient/Family education, Balance training, Stair training, Dry Needling, Joint mobilization, Spinal mobilization, Cryotherapy, and Moist heat  PLAN FOR NEXT SESSION: NuStep, lumbar and lower extremity strengthening, upper trapezius stretch, manual therapy, and modalities as needed   Granville Lewis, PT 04/13/2023, 11:57 AM

## 2023-04-17 ENCOUNTER — Encounter: Payer: Self-pay | Admitting: Family Medicine

## 2023-04-20 ENCOUNTER — Ambulatory Visit: Payer: Medicare Other | Admitting: *Deleted

## 2023-04-20 ENCOUNTER — Encounter: Payer: Self-pay | Admitting: *Deleted

## 2023-04-20 DIAGNOSIS — M5459 Other low back pain: Secondary | ICD-10-CM | POA: Diagnosis not present

## 2023-04-20 DIAGNOSIS — M6281 Muscle weakness (generalized): Secondary | ICD-10-CM | POA: Diagnosis not present

## 2023-04-20 DIAGNOSIS — M542 Cervicalgia: Secondary | ICD-10-CM | POA: Diagnosis not present

## 2023-04-20 NOTE — Therapy (Addendum)
 OUTPATIENT PHYSICAL THERAPY CERVICAL TREATMENT   Patient Name: GODFREY MCCREERY MRN: [id number] DOB:[birth year], 63 y.o., male Today's Date: 04/20/2023  END OF SESSION:  PT End of Session - 04/20/23 1102     Visit Number 8    Number of Visits 8    Date for PT Re-Evaluation 06/05/23    PT Start Time 1102    PT Stop Time 1149    PT Time Calculation (min) 47 min                Past Medical History:  Diagnosis Date   Gout    Hyperlipidemia    Hypertension    Stroke (HCC) 2014   Past Surgical History:  Procedure Laterality Date   HERNIA REPAIR  03/14/2022   NO PAST SURGERIES     XI ROBOTIC ASSISTED VENTRAL HERNIA N/A 03/14/2022   Procedure: XI ROBOTIC ASSISTED VENTRAL HERNIA W/ MESH;  Surgeon: Awilda Bogus, MD;  Location: [facility];  Service: General;  Laterality: N/A;   Patient Active Problem List   Diagnosis Date Noted   CKD (chronic kidney disease) stage 3, GFR 30-59 ml/min (HCC) 09/24/2022   Umbilical hernia without obstruction and without gangrene 02/25/2022   GAD (generalized anxiety disorder) 08/01/2021   History of stroke 01/02/2020   Prediabetes 01/23/2017   Depression 10/10/2013   Hyperlipidemia 10/10/2013   Spastic hemiplegia affecting dominant side (HCC) 05/23/2013   Difficulty walking 03/08/2013   Right leg weakness 03/08/2013   Balance problems 03/08/2013   Weakness of right upper extremity 03/08/2013   Gout 08/05/2012   Essential hypertension 08/05/2012   REFERRING PROVIDER: Dettinger, Lucio Sabin, MD   REFERRING DIAG: Right lumbar pain, Strain of neck muscle, subsequent encounter   THERAPY DIAG:  Cervicalgia  Other low back pain  Muscle weakness (generalized)  Rationale for Evaluation and Treatment: Rehabilitation  ONSET DATE: 4-5 months (neck pain) and 3-4 weeks (low back pain)   SUBJECTIVE:                                                                                                                                                                                                          SUBJECTIVE STATEMENT: Patient reports that he had to take some mm relaxors to decrease pain LT side over the weekend. Doing good today  Hand dominance: Left  PERTINENT HISTORY:  Hypertension, spastic hemiplegia affecting dominant side (right side affected), chronic kidney disease, depression, history of a CVA, anxiety, and hypertension  CERVICAL PAIN:  Are you having pain? Yes: NPRS scale: 2/10 Pain location: left  upper trapezius Pain description: intermittent throbbing and ache Aggravating factors: laying down, pressure on his neck Relieving factors: prednisone    LUMBAR PAIN:  Are you having pain? Yes: NPRS scale: Current: 02/10 Best: 2/10 Worst: 7/10 Pain location: right low back/ hip Pain description: pressure and sore Aggravating factors: walking Relieving factors: prednisone   PRECAUTIONS: None  RED FLAGS: None     WEIGHT BEARING RESTRICTIONS: No  FALLS:  Has patient fallen in last 6 months?  No, but he has been unsteady since his stroke  LIVING ENVIRONMENT: Lives with: lives with their son Lives in: House/apartment Stairs: Yes: External: 3 steps; none Has following equipment at home: None  OCCUPATION: not working  PLOF: Independent  PATIENT GOALS: reduced pain, be able to lay on his left side, and walk longer  NEXT MD VISIT: 04/09/23  OBJECTIVE:  Note: Objective measures were completed at Evaluation unless otherwise noted.  PATIENT SURVEYS:  Modified Oswestry 46% disability  NDI 18% disability  COGNITION: Overall cognitive status: Within functional limits for tasks assessed  SENSATION: Patient reports no numbness or tingling  PALPATION: TTP: left upper trapezius, right lumbar paraspinals, QL, and IT band   CERVICAL ROM:   Active ROM A/PROM (deg) eval  Flexion 40  Extension 40  Right lateral flexion 24  Left lateral flexion 26; familiar pain  Right rotation 61  Left rotation 38; familiar  neck pain   (Blank rows = not tested)  LUMBAR ROM: Unable to be assessed due to time constraints  Active  A/PROM  eval  Flexion   Extension   Right lateral flexion   Left lateral flexion   Right rotation   Left rotation    (Blank rows = not tested)   UPPER EXTREMITY ROM: R UE limited due to previous CVA   UPPER EXTREMITY MMT: right UE unable to be assessed due to CVA   MMT Right eval Left eval  Shoulder flexion  4/5  Shoulder extension    Shoulder abduction  4-/5  Shoulder adduction    Shoulder extension    Shoulder internal rotation    Shoulder external rotation    Middle trapezius    Lower trapezius    Elbow flexion    Elbow extension    Wrist flexion    Wrist extension    Wrist ulnar deviation    Wrist radial deviation    Wrist pronation    Wrist supination    Grip strength     (Blank rows = not tested)  LOWER EXTREMITY MMT:    MMT Right eval Left eval  Hip flexion 4+/5 4+/5  Hip extension    Hip abduction    Hip adduction    Hip internal rotation    Hip external rotation    Knee flexion 3/5 5/5  Knee extension 4/5 4+/5  Ankle dorsiflexion    Ankle plantarflexion    Ankle inversion    Ankle eversion     (Blank rows = not tested)   TREATMENT DATE:  04/20/23 EXERCISE LOG  Exercise Repetitions and Resistance Comments  Manual therapy     Cervical circles 5 reps each    L upper trapezius stretch  6 x 30 seconds   L levator scapulae stretch  4 x 30 seconds   L cervical rotation with self overpressure 2 minutes   Scapular retraction with elevation and depression 10 reps  For postural reeducation  Isometric ball press     Blank cell = exercise not performed today  Manual Therapy Soft Tissue Mobilization: TPR left upper trapezius and levator scapulae, for reduced pain and tone   Modalities: no  redness or adverse reaction to today's modalities  Date:  Unattended Estim: left upper trapezius, pre mod @ 80-150 Hz, 15 mins, Pain and Tone Hot Pack: Cervical, 15 mins, Pain and Tone                                   04/06/23 EXERCISE LOG  Exercise Repetitions and Resistance Comments  L levator scapulae stretch  3 x 30 seconds    Scapular circles 20 reps each    L shoulder shrug 8# x 25 reps             Blank cell = exercise not performed today  Manual Therapy Soft Tissue Mobilization: left upper trapezius, levator scapulae, and cervical paraspinals, for reduced pain and tone Joint Mobilizations: C4-7, grade I-III   Modalities: no redness or adverse reaction to today's modalities  Date:  Unattended Estim: left upper trapezius, pre mod @ 80-150 Hz, 15 mins, Tone Hot Pack: Cervical, 15 mins, Tone       04/01/23                        EXERCISE LOG   LT side neck  Exercise Repetitions and Resistance Comments  LT UE row Blue x 30 reps   LT UE pull down Blue x 30 reps   L shoulder Shrug 7# x 25 reps        Blank cell = exercise not performed today   Manual Therapy Soft Tissue Mobilization: left cervical, STW/M to left cervical paraspinals, left upper trap, and left levator scapula to decrease pain and tone    Modalities  Date: seated Unattended Estim: Cervical, IFC 80-150 hz, 15 mins, Pain and Tone Hot Pack: Cervical, 15 mins, Pain and Tone   PATIENT EDUCATION:  Education details: Plan of care, prognosis, healing, anatomy, and goals for therapy Person educated: Patient Education method: Explanation Education comprehension: verbalized understanding  HOME EXERCISE PROGRAM: [id number]  ASSESSMENT:  CLINICAL IMPRESSION: Patient arrived today doing fairly well with LT sided neck pain. Rx focused on HEP , STW as well as IFC and info on home TENS unit. Pt did great and was able to meet all LTG's  except ROM goal due to tightness.  PHYSICAL THERAPY DISCHARGE SUMMARY  Visits  from Start of Care: 8  Current functional level related to goals / functional outcomes: Patient was able to meet most of his goals for skilled physical therapy.    Remaining deficits: Cervical AROM    Education / Equipment: HEP   Patient agrees to discharge. Patient goals were partially met. Patient is being discharged due to being pleased with the current functional level.  Glendora Landsman, PT, DPT   OBJECTIVE IMPAIRMENTS: Abnormal gait, decreased activity tolerance, decreased mobility, difficulty walking, decreased ROM, decreased strength, hypomobility,  impaired tone, impaired UE functional use, and pain.   ACTIVITY LIMITATIONS: carrying, lifting, and locomotion level  PARTICIPATION LIMITATIONS: meal prep, cleaning, laundry, driving, shopping, and community activity  PERSONAL FACTORS: Past/current experiences, Time since onset of injury/illness/exacerbation, and 3+ comorbidities: Hypertension, spastic hemiplegia affecting dominant side (right side affected), chronic kidney disease, depression, history of a CVA, anxiety, and hypertension  are also affecting patient's functional outcome.   REHAB POTENTIAL: Good  CLINICAL DECISION MAKING: Evolving/moderate complexity  EVALUATION COMPLEXITY: Moderate   GOALS: Goals reviewed with patient? Yes  LONG TERM GOALS: Target date: 04/16/23  Patient will be independent with his HEP. Baseline:  Goal status: MET  2.  Patient will report being able to complete his daily activities without his familiar pain exceeding 5/10. Baseline:  Goal status: MET  3.  Patient will report being able to sleep on his left side without being awakened by his familiar symptoms. Baseline: 1/22: "depends on the day" Goal status: MET  4.  Patient will improve his left cervical rotation to at least 60 degrees for improved awareness of his surroundings. Baseline: 1/22: 55 degrees Goal status: NM   5.  Patient will improve his modified Oswestry score to 36% or  less for improved perceived function with his daily activities. Baseline: 1/22: 4% Goal status: Met  PLAN:  PT FREQUENCY: 2x/week  PT DURATION: 4 weeks  PLANNED INTERVENTIONS: 97164- PT Re-evaluation, 97110-Therapeutic exercises, 97530- Therapeutic activity, 97112- Neuromuscular re-education, 97535- Self Care, 29562- Manual therapy, 97116- Gait training, 97014- Electrical stimulation (unattended), 97035- Ultrasound, Patient/Family education, Balance training, Stair training, Dry Needling, Joint mobilization, Spinal mobilization, Cryotherapy, and Moist heat  PLAN FOR NEXT SESSION:     DC to HEP Lynford Espinoza,CHRIS, PTA 04/20/2023, 12:48 PM

## 2023-05-09 DIAGNOSIS — E119 Type 2 diabetes mellitus without complications: Secondary | ICD-10-CM | POA: Diagnosis not present

## 2023-05-16 ENCOUNTER — Other Ambulatory Visit: Payer: Self-pay | Admitting: Family Medicine

## 2023-05-16 DIAGNOSIS — M1A0420 Idiopathic chronic gout, left hand, without tophus (tophi): Secondary | ICD-10-CM

## 2023-06-09 DIAGNOSIS — E119 Type 2 diabetes mellitus without complications: Secondary | ICD-10-CM | POA: Diagnosis not present

## 2023-06-19 DIAGNOSIS — K080 Exfoliation of teeth due to systemic causes: Secondary | ICD-10-CM | POA: Diagnosis not present

## 2023-07-08 DIAGNOSIS — L570 Actinic keratosis: Secondary | ICD-10-CM | POA: Diagnosis not present

## 2023-07-09 DIAGNOSIS — E119 Type 2 diabetes mellitus without complications: Secondary | ICD-10-CM | POA: Diagnosis not present

## 2023-07-17 ENCOUNTER — Encounter: Payer: Self-pay | Admitting: Family Medicine

## 2023-07-17 ENCOUNTER — Ambulatory Visit (INDEPENDENT_AMBULATORY_CARE_PROVIDER_SITE_OTHER): Payer: Medicare Other | Admitting: Family Medicine

## 2023-07-17 VITALS — BP 126/62 | HR 53 | Ht 64.0 in | Wt 185.0 lb

## 2023-07-17 DIAGNOSIS — Z125 Encounter for screening for malignant neoplasm of prostate: Secondary | ICD-10-CM | POA: Diagnosis not present

## 2023-07-17 DIAGNOSIS — I10 Essential (primary) hypertension: Secondary | ICD-10-CM

## 2023-07-17 DIAGNOSIS — E782 Mixed hyperlipidemia: Secondary | ICD-10-CM

## 2023-07-17 DIAGNOSIS — N1831 Chronic kidney disease, stage 3a: Secondary | ICD-10-CM

## 2023-07-17 DIAGNOSIS — R7303 Prediabetes: Secondary | ICD-10-CM

## 2023-07-17 LAB — BAYER DCA HB A1C WAIVED: HB A1C (BAYER DCA - WAIVED): 5.7 % — ABNORMAL HIGH (ref 4.8–5.6)

## 2023-07-17 NOTE — Progress Notes (Signed)
 BP 126/62   Pulse (!) 53   Ht 5\' 4"  (1.626 m)   Wt 185 lb (83.9 kg)   SpO2 97%   BMI 31.76 kg/m    Subjective:   Patient ID: Bruce Guerrero, male    DOB: 1960-04-10, 63 y.o.   MRN: [id number]  HPI: Bruce Guerrero is a 63 y.o. male presenting on 07/17/2023 for Medical Management of Chronic Issues, Hyperlipidemia, Hypertension, and Prediabetes   HPI Prediabetes Patient comes in today for recheck of his diabetes. Patient has been currently taking the medicine currently. Patient is not currently on an ACE inhibitor/ARB. Patient has not seen an ophthalmologist this year. Patient denies any new issues with their feet. The symptom started onset as an adult CKD 3 and hypertension and hyperlipidemia ARE RELATED TO DM   Hypertension Patient is currently on amlodipine  and carvedilol , and their blood pressure today is 126/62. Patient denies any lightheadedness or dizziness. Patient denies headaches, blurred vision, chest pains, shortness of breath, or weakness. Denies any side effects from medication and is content with current medication.   Hyperlipidemia Patient is coming in for recheck of his hyperlipidemia. The patient is currently taking atorvastatin . They deny any issues with myalgias or history of liver damage from it. They deny any focal numbness or weakness or chest pain.   Relevant past medical, surgical, family and social history reviewed and updated as indicated. Interim medical history since our last visit reviewed. Allergies and medications reviewed and updated.  Review of Systems  Constitutional:  Negative for chills and fever.  Eyes:  Negative for visual disturbance.  Respiratory:  Negative for shortness of breath and wheezing.   Cardiovascular:  Negative for chest pain and leg swelling.  Musculoskeletal:  Negative for back pain and gait problem.  Skin:  Negative for rash.  Neurological:  Positive for weakness. Negative for dizziness and light-headedness.  All other  systems reviewed and are negative.   Per HPI unless specifically indicated above   Allergies as of 07/17/2023   No Known Allergies      Medication List        Accurate as of Jul 17, 2023 10:01 AM. If you have any questions, ask your nurse or doctor.          allopurinol  300 MG tablet Commonly known as: ZYLOPRIM  Take 1 tablet (300 mg total) by mouth daily.   amLODipine  10 MG tablet Commonly known as: NORVASC  Take 1 tablet (10 mg total) by mouth daily.   atorvastatin  80 MG tablet Commonly known as: LIPITOR TAKE 1 TABLET BY MOUTH EVERY DAY AT 6 PM   baclofen  10 MG tablet Commonly known as: LIORESAL  Take 1 tablet (10 mg total) by mouth 3 (three) times daily.   carvedilol  6.25 MG tablet Commonly known as: COREG  Take 1 tablet (6.25 mg total) by mouth 2 (two) times daily with a meal.   Cinnamon 500 MG capsule Take 1,000 mg by mouth daily.   clopidogrel  75 MG tablet Commonly known as: PLAVIX  Take 1 tablet (75 mg total) by mouth daily.   diclofenac  75 MG EC tablet Commonly known as: VOLTAREN  Take 1 tablet (75 mg total) by mouth 2 (two) times daily.   ferrous sulfate 325 (65 FE) MG tablet Take 325 mg by mouth daily with breakfast.   sertraline  50 MG tablet Commonly known as: ZOLOFT  Take 1 tablet (50 mg total) by mouth daily.   terbinafine  250 MG tablet Commonly known as: LAMISIL  Take 1 tablet (250 mg  total) by mouth daily.         Objective:   BP 126/62   Pulse (!) 53   Ht 5\' 4"  (1.626 m)   Wt 185 lb (83.9 kg)   SpO2 97%   BMI 31.76 kg/m   Wt Readings from Last 3 Encounters:  07/17/23 185 lb (83.9 kg)  04/09/23 182 lb (82.6 kg)  03/13/23 187 lb (84.8 kg)    Physical Exam Vitals and nursing note reviewed.  Constitutional:      General: He is not in acute distress.    Appearance: He is well-developed. He is not diaphoretic.     Comments: Right-sided weakness that has had for some time.  Eyes:     General: No scleral icterus.       Right eye:  No discharge.     Conjunctiva/sclera: Conjunctivae normal.     Pupils: Pupils are equal, round, and reactive to light.  Neck:     Thyroid: No thyromegaly.  Cardiovascular:     Rate and Rhythm: Normal rate and regular rhythm.     Heart sounds: Normal heart sounds. No murmur heard. Pulmonary:     Effort: Pulmonary effort is normal. No respiratory distress.     Breath sounds: Normal breath sounds. No wheezing.  Musculoskeletal:        General: Normal range of motion.     Cervical back: Neck supple.  Lymphadenopathy:     Cervical: No cervical adenopathy.  Skin:    General: Skin is warm and dry.     Findings: No rash.  Neurological:     Mental Status: He is alert and oriented to person, place, and time.     Coordination: Coordination normal.  Psychiatric:        Behavior: Behavior normal.       Assessment & Plan:   Problem List Items Addressed This Visit       Cardiovascular and Mediastinum   Essential hypertension   Relevant Orders   Bayer DCA Hb A1c Waived   CBC with Differential/Platelet   CMP14+EGFR   Lipid panel     Genitourinary   CKD (chronic kidney disease) stage 3, GFR 30-59 ml/min (HCC)   Relevant Orders   Bayer DCA Hb A1c Waived   CBC with Differential/Platelet   CMP14+EGFR   Lipid panel     Other   Hyperlipidemia   Relevant Orders   Bayer DCA Hb A1c Waived   CBC with Differential/Platelet   CMP14+EGFR   Lipid panel   Prediabetes - Primary   Relevant Orders   Bayer DCA Hb A1c Waived   CBC with Differential/Platelet   CMP14+EGFR   Lipid panel   Other Visit Diagnoses       Prostate cancer screening       Relevant Orders   PSA, total and free       A1c looks good at 5.7.  Blood pressure and everything else looks good, will await the rest of his blood work results. Follow up plan: Return if symptoms worsen or fail to improve, for 3 to 46-month prediabetes recheck.  Counseling provided for all of the vaccine components Orders Placed This  Encounter  Procedures   Bayer DCA Hb A1c Waived   CBC with Differential/Platelet   CMP14+EGFR   Lipid panel   PSA, total and free    Jolyne Needs, MD [facility] 07/17/2023, 10:01 AM

## 2023-07-18 LAB — CBC WITH DIFFERENTIAL/PLATELET
Basophils Absolute: 0 x10E3/uL (ref 0.0–0.2)
Basos: 1 %
EOS (ABSOLUTE): 0.1 x10E3/uL (ref 0.0–0.4)
Eos: 1 %
Hematocrit: 46.4 % (ref 37.5–51.0)
Hemoglobin: 15.8 g/dL (ref 13.0–17.7)
Immature Grans (Abs): 0 x10E3/uL (ref 0.0–0.1)
Immature Granulocytes: 0 %
Lymphocytes Absolute: 1.4 x10E3/uL (ref 0.7–3.1)
Lymphs: 25 %
MCH: 32.5 pg (ref 26.6–33.0)
MCHC: 34.1 g/dL (ref 31.5–35.7)
MCV: 96 fL (ref 79–97)
Monocytes Absolute: 0.5 x10E3/uL (ref 0.1–0.9)
Monocytes: 8 %
Neutrophils Absolute: 3.7 x10E3/uL (ref 1.4–7.0)
Neutrophils: 65 %
Platelets: 161 x10E3/uL (ref 150–450)
RBC: 4.86 x10E6/uL (ref 4.14–5.80)
RDW: 12.5 % (ref 11.6–15.4)
WBC: 5.7 x10E3/uL (ref 3.4–10.8)

## 2023-07-18 LAB — LIPID PANEL
Chol/HDL Ratio: 3.1 ratio (ref 0.0–5.0)
Cholesterol, Total: 107 mg/dL (ref 100–199)
HDL: 35 mg/dL — ABNORMAL LOW
LDL Chol Calc (NIH): 56 mg/dL (ref 0–99)
Triglycerides: 80 mg/dL (ref 0–149)
VLDL Cholesterol Cal: 16 mg/dL (ref 5–40)

## 2023-07-18 LAB — CMP14+EGFR
ALT: 26 IU/L (ref 0–44)
AST: 19 IU/L (ref 0–40)
Albumin: 4.6 g/dL (ref 3.9–4.9)
Alkaline Phosphatase: 115 IU/L (ref 44–121)
BUN/Creatinine Ratio: 16 (ref 10–24)
BUN: 24 mg/dL (ref 8–27)
Bilirubin Total: 0.4 mg/dL (ref 0.0–1.2)
CO2: 23 mmol/L (ref 20–29)
Calcium: 9.6 mg/dL (ref 8.6–10.2)
Chloride: 102 mmol/L (ref 96–106)
Creatinine, Ser: 1.46 mg/dL — ABNORMAL HIGH (ref 0.76–1.27)
Globulin, Total: 1.8 g/dL (ref 1.5–4.5)
Glucose: 131 mg/dL — ABNORMAL HIGH (ref 70–99)
Potassium: 4.7 mmol/L (ref 3.5–5.2)
Sodium: 139 mmol/L (ref 134–144)
Total Protein: 6.4 g/dL (ref 6.0–8.5)
eGFR: 54 mL/min/1.73 — ABNORMAL LOW

## 2023-07-18 LAB — PSA, TOTAL AND FREE
PSA, Free Pct: 58 %
PSA, Free: 0.29 ng/mL
Prostate Specific Ag, Serum: 0.5 ng/mL (ref 0.0–4.0)

## 2023-07-24 ENCOUNTER — Telehealth: Payer: Self-pay

## 2023-07-24 ENCOUNTER — Ambulatory Visit: Payer: Self-pay | Admitting: Family Medicine

## 2023-07-24 NOTE — Telephone Encounter (Signed)
 Copied from CRM [id number]. Topic: Clinical - Lab/Test Results >> Jul 24, 2023 11:09 AM Bruce Guerrero wrote: Reason for CRM: Pt called in due to a missed call. Call was for lab work. Read as follows        "07/24/23 10:39 AM Kidney function is stable, liver function looks normal.  Cholesterol and hemoglobin and platelets look good.  PSA looks good.  No changes"    Pt understood no further questions

## 2023-07-24 NOTE — Telephone Encounter (Signed)
 Copied from CRM [id number]. Topic: Clinical - Lab/Test Results >> Jul 24, 2023 11:09 AM Ivette P wrote: Reason for CRM: Pt called in due to a missed call. Call was for lab work. Read as follows        "07/24/23 10:39 AM Kidney function is stable, liver function looks normal.  Cholesterol and hemoglobin and platelets look good.  PSA looks good.  No changes"    Pt understood no further questions

## 2023-07-24 NOTE — Telephone Encounter (Signed)
 Noted.

## 2023-07-25 ENCOUNTER — Other Ambulatory Visit: Payer: Self-pay | Admitting: Family Medicine

## 2023-07-25 DIAGNOSIS — Z8673 Personal history of transient ischemic attack (TIA), and cerebral infarction without residual deficits: Secondary | ICD-10-CM

## 2023-08-01 ENCOUNTER — Other Ambulatory Visit: Payer: Self-pay | Admitting: Family Medicine

## 2023-08-01 DIAGNOSIS — M62838 Other muscle spasm: Secondary | ICD-10-CM

## 2023-08-09 DIAGNOSIS — E119 Type 2 diabetes mellitus without complications: Secondary | ICD-10-CM | POA: Diagnosis not present

## 2023-09-08 DIAGNOSIS — E119 Type 2 diabetes mellitus without complications: Secondary | ICD-10-CM | POA: Diagnosis not present

## 2023-09-25 ENCOUNTER — Ambulatory Visit (INDEPENDENT_AMBULATORY_CARE_PROVIDER_SITE_OTHER): Payer: Medicare Other

## 2023-09-25 VITALS — BP 126/62 | HR 53 | Ht 64.0 in | Wt 185.0 lb

## 2023-09-25 DIAGNOSIS — Z0000 Encounter for general adult medical examination without abnormal findings: Secondary | ICD-10-CM | POA: Diagnosis not present

## 2023-09-25 NOTE — Progress Notes (Signed)
 Subjective:   Bruce Guerrero is a 63 y.o. who presents for a Medicare Wellness preventive visit.  As a reminder, Annual Wellness Visits don't include a physical exam, and some assessments may be limited, especially if this visit is performed virtually. We may recommend an in-person follow-up visit with your provider if needed.  Visit Complete: Virtual I connected with  Bruce Guerrero on 09/25/23 by a audio enabled telemedicine application and verified that I am speaking with the correct person using two identifiers.  Patient Location: Home  Provider Location: Home Office  I discussed the limitations of evaluation and management by telemedicine. The patient expressed understanding and agreed to proceed.  Vital Signs: Because this visit was a virtual/telehealth visit, some criteria may be missing or patient reported. Any vitals not documented were not able to be obtained and vitals that have been documented are patient reported.  VideoDeclined- This patient declined Librarian, academic. Therefore the visit was completed with audio only.  Persons Participating in Visit: Patient.  AWV Questionnaire: Yes: Patient Medicare AWV questionnaire was completed by the patient on 09/24/23; I have confirmed that all information answered by patient is correct and no changes since this date.  Cardiac Risk Factors include: advanced age (>46men, >68 women);male gender;dyslipidemia;obesity (BMI >30kg/m2)     Objective:    Today's Vitals   09/25/23 0807  BP: 126/62  Pulse: (!) 53  Weight: 185 lb (83.9 kg)  Height: 5' 4 (1.626 m)   Body mass index is 31.76 kg/m.     09/25/2023    8:11 AM 03/19/2023    6:33 PM 09/24/2022    9:39 AM 03/12/2022    8:29 AM 09/19/2021    8:23 AM 09/18/2020    8:31 AM 08/30/2019    2:27 PM  Advanced Directives  Does Patient Have a Medical Advance Directive? No No No No No No Yes;No  Does patient want to make changes to medical advance  directive?       No - Patient declined  Would patient like information on creating a medical advance directive?   No - Patient declined No - Patient declined No - Patient declined No - Patient declined No - Patient declined    Current Medications (verified) Outpatient Encounter Medications as of 09/25/2023  Medication Sig   allopurinol  (ZYLOPRIM ) 300 MG tablet Take 1 tablet (300 mg total) by mouth daily.   amLODipine  (NORVASC ) 10 MG tablet Take 1 tablet (10 mg total) by mouth daily.   atorvastatin  (LIPITOR) 80 MG tablet TAKE 1 TABLET BY MOUTH EVERY DAY AT 6 PM   baclofen  (LIORESAL ) 10 MG tablet TAKE 1 TABLET BY MOUTH THREE TIMES A DAY   carvedilol  (COREG ) 6.25 MG tablet Take 1 tablet (6.25 mg total) by mouth 2 (two) times daily with a meal.   Cinnamon 500 MG capsule Take 1,000 mg by mouth daily.   clopidogrel  (PLAVIX ) 75 MG tablet TAKE 1 TABLET BY MOUTH EVERY DAY   diclofenac  (VOLTAREN ) 75 MG EC tablet Take 1 tablet (75 mg total) by mouth 2 (two) times daily.   ferrous sulfate 325 (65 FE) MG tablet Take 325 mg by mouth daily with breakfast.   sertraline  (ZOLOFT ) 50 MG tablet Take 1 tablet (50 mg total) by mouth daily.   terbinafine  (LAMISIL ) 250 MG tablet Take 1 tablet (250 mg total) by mouth daily.   No facility-administered encounter medications on file as of 09/25/2023.    Allergies (verified) Patient has no known allergies.  History: Past Medical History:  Diagnosis Date   Gout    Hyperlipidemia    Hypertension    Stroke [facility]) 2014   Past Surgical History:  Procedure Laterality Date   HERNIA REPAIR  03/14/2022   NO PAST SURGERIES     XI ROBOTIC ASSISTED VENTRAL HERNIA N/A 03/14/2022   Procedure: XI ROBOTIC ASSISTED VENTRAL HERNIA W/ MESH;  Surgeon: Kallie Manuelita BROCKS, MD;  Location: [facility];  Service: General;  Laterality: N/A;   Family History  Problem Relation Age of Onset   COPD Mother    Arthritis Mother    COPD Father    Cancer Sister    Colon cancer Sister         late 80's   Hyperlipidemia Brother    Diabetes Brother    Social History   Socioeconomic History   Marital status: Divorced    Spouse name: Not on file   Number of children: 1   Years of education: Not on file   Highest education level: Associate degree: occupational, Scientist, product/process development, or vocational program  Occupational History   Occupation: retired/ disabilty  Tobacco Use   Smoking status: Never   Smokeless tobacco: Never  Vaping Use   Vaping status: Never Used  Substance and Sexual Activity   Alcohol use: No   Drug use: No   Sexual activity: Not Currently  Other Topics Concern   Not on file  Social History Narrative   One son lives at home with him. Other son lives in [place]   Social Drivers of Health   Financial Resource Strain: Medium Risk (03/12/2023)   Overall Financial Resource Strain (CARDIA)    Difficulty of Paying Living Expenses: Somewhat hard  Food Insecurity: No Food Insecurity (03/12/2023)   Hunger Vital Sign    Worried About Running Out of Food in the Last Year: Never true    Ran Out of Food in the Last Year: Never true  Recent Concern: Food Insecurity - Food Insecurity Present (12/25/2022)   Hunger Vital Sign    Worried About Running Out of Food in the Last Year: Sometimes true    Ran Out of Food in the Last Year: Never true  Transportation Needs: No Transportation Needs (03/12/2023)   PRAPARE - Administrator, Civil Service (Medical): No    Lack of Transportation (Non-Medical): No  Physical Activity: Sufficiently Active (03/12/2023)   Exercise Vital Sign    Days of Exercise per Week: 3 days    Minutes of Exercise per Session: 60 min  Stress: No Stress Concern Present (03/12/2023)   [employer] of Occupational Health - Occupational Stress Questionnaire    Feeling of Stress : Not at all  Social Connections: Moderately Integrated (03/12/2023)   Social Connection and Isolation Panel    Frequency of Communication with Friends and Family: More than  three times a week    Frequency of Social Gatherings with Friends and Family: More than three times a week    Attends Religious Services: More than 4 times per year    Active Member of [employer] or Organizations: Yes    Attends Engineer, structural: More than 4 times per year    Marital Status: Divorced    Tobacco Counseling Counseling given: Yes    Clinical Intake:  Pre-visit preparation completed: Yes  Pain : No/denies pain     BMI - recorded: 31.76 Nutritional Status: BMI > 30  Obese Nutritional Risks: None Diabetes: No  Lab Results  Component Value  Date   HGBA1C 5.7 (H) 07/17/2023   HGBA1C 6.2 (H) 04/09/2023   HGBA1C 5.8 (H) 12/26/2022     How often do you need to have someone help you when you read instructions, pamphlets, or other written materials from your doctor or pharmacy?: 1 - Never  Interpreter Needed?: No  Information entered by :: alia t/cma   Activities of Daily Living     09/24/2023   12:35 PM  In your present state of health, do you have any difficulty performing the following activities:  Hearing? 0  Vision? 0  Difficulty concentrating or making decisions? 0  Walking or climbing stairs? 1  Dressing or bathing? 0  Doing errands, shopping? 0  Preparing Food and eating ? N  Using the Toilet? N  In the past six months, have you accidently leaked urine? N  Do you have problems with loss of bowel control? N  Managing your Medications? N  Managing your Finances? N  Housekeeping or managing your Housekeeping? N    Patient Care Team: Dettinger, Fonda LABOR, MD as PCP - General (Family Medicine) Eda Baxter, MD as Referring Physician (Dermatology) Ladora Ross Lacy Phebe, MD as Referring Physician (Optometry)  I have updated your Care Teams any recent Medical Services you may have received from other providers in the past year.     Assessment:   This is a routine wellness examination for Bruce Guerrero.  Hearing/Vision screen Hearing Screening  - Comments:: Pt denies hearing dif Vision Screening - Comments:: Pt denies vision dif/pt goes to [facility], Dr. Rhea ov 12/24   Goals Addressed               This Visit's Progress     Patient Stated   On track     09/19/21 - hopes to stay active and independent      Patient Stated (pt-stated)   On track     Patients goal for the up coming year is to lose weight       Depression Screen     09/25/2023    8:11 AM 07/17/2023    9:53 AM 04/09/2023    8:14 AM 03/13/2023   10:24 AM 12/26/2022    8:39 AM 09/24/2022    9:41 AM 09/24/2022    7:57 AM  PHQ 2/9 Scores  PHQ - 2 Score 0 0 0 0 0 0 0  PHQ- 9 Score 1  0   0 0    Fall Risk     09/24/2023   12:35 PM 07/17/2023    9:53 AM 04/09/2023    8:14 AM 03/13/2023   10:24 AM 12/26/2022    8:39 AM  Fall Risk   Falls in the past year? 0 0 0 0 0  Number falls in past yr: 0 0     Injury with Fall? 0 0     Risk for fall due to :  Impaired balance/gait     Follow up  Falls evaluation completed       MEDICARE RISK AT HOME:  Medicare Risk at Home Any stairs in or around the home?: (Patient-Rptd) Yes If so, are there any without handrails?: (Patient-Rptd) Yes Home free of loose throw rugs in walkways, pet beds, electrical cords, etc?: (Patient-Rptd) Yes Adequate lighting in your home to reduce risk of falls?: (Patient-Rptd) No Life alert?: (Patient-Rptd) No Use of a cane, walker or w/c?: (Patient-Rptd) Yes Grab bars in the bathroom?: (Patient-Rptd) No Shower chair or bench in shower?: (Patient-Rptd) Yes Elevated  toilet seat or a handicapped toilet?: (Patient-Rptd) No  TIMED UP AND GO:  Was the test performed?  no  Cognitive Function: 6CIT completed        09/25/2023    8:13 AM 09/24/2022    9:34 AM 09/19/2021    8:25 AM 08/30/2019    2:29 PM  6CIT Screen  What Year? 0 points 0 points 0 points 0 points  What month? 0 points 0 points 0 points 0 points  What time? 0 points 0 points 0 points 0 points  Count back from 20 0 points 0  points 0 points 0 points  Months in reverse 0 points 0 points 0 points 0 points  Repeat phrase 0 points 0 points 0 points 0 points  Total Score 0 points 0 points 0 points 0 points    Immunizations Immunization History  Administered Date(s) Administered   Influenza, Seasonal, Injecte, Preservative Fre 12/26/2022   Influenza,inj,Quad PF,6+ Mos 02/02/2013, 01/11/2014, 12/22/2014, 12/31/2015, 12/22/2016, 12/23/2017, 11/30/2018, 12/01/2019, 12/04/2020, 12/11/2021   Moderna Covid-19 Fall Seasonal Vaccine 23yrs & older 01/12/2023   Moderna SARS-COV2 Booster Vaccination 12/30/2021   Moderna Sars-Covid-2 Vaccination 05/26/2019, 06/24/2019, 01/12/2020, 10/04/2020, 12/27/2020   PNEUMOCOCCAL CONJUGATE-20 04/09/2023   Pneumococcal Conjugate-13 10/16/2020   Tdap 10/10/2013   Zoster Recombinant(Shingrix) 11/01/2020, 03/12/2021    Screening Tests Health Maintenance  Topic Date Due   OPHTHALMOLOGY EXAM  07/25/2022   Diabetic kidney evaluation - Urine ACR  09/24/2023   INFLUENZA VACCINE  10/09/2023   DTaP/Tdap/Td (2 - Td or Tdap) 10/11/2023   HEMOGLOBIN A1C  01/17/2024   FOOT EXAM  04/08/2024   Diabetic kidney evaluation - eGFR measurement  07/16/2024   Medicare Annual Wellness (AWV)  09/24/2024   Colonoscopy  05/10/2026   Pneumococcal Vaccine 1-84 Years old  Completed   COVID-19 Vaccine  Completed   Hepatitis C Screening  Completed   Zoster Vaccines- Shingrix  Completed   Hepatitis B Vaccines  Aged Out   HPV VACCINES  Aged Out   Meningococcal B Vaccine  Aged Out   HIV Screening  Discontinued    Health Maintenance  Health Maintenance Due  Topic Date Due   OPHTHALMOLOGY EXAM  07/25/2022   Diabetic kidney evaluation - Urine ACR  09/24/2023   Health Maintenance Items Addressed: See Nurse Notes at the end of this note  Additional Screening:  Vision Screening: Recommended annual ophthalmology exams for early detection of glaucoma and other disorders of the eye. Would you like a  referral to an eye doctor? No    Dental Screening: Recommended annual dental exams for proper oral hygiene  Community Resource Referral / Chronic Care Management: CRR required this visit?  No   CCM required this visit?  No   Plan:    I have personally reviewed and noted the following in the patient's chart:   Medical and social history Use of alcohol, tobacco or illicit drugs  Current medications and supplements including opioid prescriptions. Patient is not currently taking opioid prescriptions. Functional ability and status Nutritional status Physical activity Advanced directives List of other physicians Hospitalizations, surgeries, and ER visits in previous 12 months Vitals Screenings to include cognitive, depression, and falls Referrals and appointments  In addition, I have reviewed and discussed with patient certain preventive protocols, quality metrics, and best practice recommendations. A written personalized care plan for preventive services as well as general preventive health recommendations were provided to patient.   Ozie Ned, CMA   09/25/2023   After Visit Summary: (MyChart)  Due to this being a telephonic visit, the after visit summary with patients personalized plan was offered to patient via MyChart   Notes: per awv pt due for UrineACR/DTAP

## 2023-09-25 NOTE — Patient Instructions (Signed)
 [name] , Thank you for taking time out of your busy schedule to complete your Annual Wellness Visit with me. I enjoyed our conversation and look forward to speaking with you again next year. I, as well as your care team,  appreciate your ongoing commitment to your health goals. Please review the following plan we discussed and let me know if I can assist you in the future. Your Game plan/ To Do List    Follow up Visits: Next Medicare AWV with our clinical staff: 09/27/24 at 8:00a.m.    Next Office Visit with your provider: 10/22/23 at 8:10a.m.  Clinician Recommendations:  Aim for 30 minutes of exercise or brisk walking, 6-8 glasses of water , and 5 servings of fruits and vegetables each day.       This is a list of the screening recommended for you and due dates:  Health Maintenance  Topic Date Due   Eye exam for diabetics  07/25/2022   Yearly kidney health urinalysis for diabetes  09/24/2023   Medicare Annual Wellness Visit  09/24/2023   Flu Shot  10/09/2023   DTaP/Tdap/Td vaccine (2 - Td or Tdap) 10/11/2023   Hemoglobin A1C  01/17/2024   Complete foot exam   04/08/2024   Yearly kidney function blood test for diabetes  07/16/2024   Colon Cancer Screening  05/10/2026   Pneumococcal Vaccination  Completed   COVID-19 Vaccine  Completed   Hepatitis C Screening  Completed   Zoster (Shingles) Vaccine  Completed   Hepatitis B Vaccine  Aged Out   HPV Vaccine  Aged Out   Meningitis B Vaccine  Aged Out   HIV Screening  Discontinued    Advanced directives: (Declined) Advance directive discussed with you today. Even though you declined this today, please call our office should you change your mind, and we can give you the proper paperwork for you to fill out. Advance Care Planning is important because it:  [x]  Makes sure you receive the medical care that is consistent with your values, goals, and preferences  [x]  It provides guidance to your family and loved ones and reduces their  decisional burden about whether or not they are making the right decisions based on your wishes.  Follow the link provided in your after visit summary or read over the paperwork we have mailed to you to help you started getting your Advance Directives in place. If you need assistance in completing these, please reach out to us  so that we can help you!  See attachments for Preventive Care and Fall Prevention Tips.

## 2023-10-09 DIAGNOSIS — E119 Type 2 diabetes mellitus without complications: Secondary | ICD-10-CM | POA: Diagnosis not present

## 2023-10-22 ENCOUNTER — Encounter: Payer: Self-pay | Admitting: Family Medicine

## 2023-10-22 ENCOUNTER — Ambulatory Visit: Admitting: Family Medicine

## 2023-10-22 VITALS — BP 119/58 | HR 51 | Ht 64.0 in | Wt 182.0 lb

## 2023-10-22 DIAGNOSIS — N1831 Chronic kidney disease, stage 3a: Secondary | ICD-10-CM | POA: Diagnosis not present

## 2023-10-22 DIAGNOSIS — R7303 Prediabetes: Secondary | ICD-10-CM

## 2023-10-22 DIAGNOSIS — I10 Essential (primary) hypertension: Secondary | ICD-10-CM | POA: Diagnosis not present

## 2023-10-22 DIAGNOSIS — E782 Mixed hyperlipidemia: Secondary | ICD-10-CM | POA: Diagnosis not present

## 2023-10-22 DIAGNOSIS — G8110 Spastic hemiplegia affecting unspecified side: Secondary | ICD-10-CM

## 2023-10-22 DIAGNOSIS — Z8673 Personal history of transient ischemic attack (TIA), and cerebral infarction without residual deficits: Secondary | ICD-10-CM

## 2023-10-22 DIAGNOSIS — Z23 Encounter for immunization: Secondary | ICD-10-CM

## 2023-10-22 LAB — BAYER DCA HB A1C WAIVED: HB A1C (BAYER DCA - WAIVED): 5.7 % — ABNORMAL HIGH (ref 4.8–5.6)

## 2023-10-22 MED ORDER — CLOPIDOGREL BISULFATE 75 MG PO TABS: 75.0000 mg | ORAL_TABLET | Freq: Every day | ORAL | 2 refills | Status: AC

## 2023-10-22 NOTE — Progress Notes (Signed)
 BP (!) 119/58   Pulse (!) 51   Ht 5' 4 (1.626 m)   Wt 182 lb (82.6 kg)   SpO2 97%   BMI 31.24 kg/m    Subjective:   Patient ID: Bruce Guerrero, male    DOB: 02/12/1961, 63 y.o.   MRN: [id number]  HPI: Bruce Guerrero is a 63 y.o. male presenting on 10/22/2023 for Medical Management of Chronic Issues, Hyperlipidemia, Hypertension, and Prediabetes   Discussed the use of AI scribe software for clinical note transcription with the patient, who gave verbal consent to proceed.  History of Present Illness   Bruce Guerrero is a 63 year old male with hypertension and coronary artery disease who presents for a recheck of his conditions.  His blood pressure is well-controlled at home, typically good in the morning. He continues to take amlodipine  and carvedilol  without any issues such as dyspnea or chest pain. He regularly wears compression socks.  He is on atorvastatin  for cholesterol management and reports no issues with myalgia or other side effects. Cholesterol levels were good at the last check.  He takes clopidogrel  for prevention of clotting and notes no bleeding issues, though he mentions hemorrhoids. He experiences constipation, which he attributes to his medications, particularly clopidogrel  and iron. He uses Miralax  occasionally to manage this.  He is on sertraline  for anxiety and depression, which he feels helps keep his mood stable. He humorously notes, 'I haven't killed anybody,' indicating his mood is stable.  He mentions being active at home, working on home improvement projects such as fixing rain gutters, which keeps him busy.          Relevant past medical, surgical, family and social history reviewed and updated as indicated. Interim medical history since our last visit reviewed. Allergies and medications reviewed and updated.  Review of Systems  Constitutional:  Negative for chills and fever.  Eyes:  Negative for discharge.  Respiratory:   Negative for shortness of breath and wheezing.   Cardiovascular:  Negative for chest pain and leg swelling.  Musculoskeletal:  Negative for back pain and gait problem.  Skin:  Negative for rash.  Neurological:  Positive for weakness.  All other systems reviewed and are negative.   Per HPI unless specifically indicated above   Allergies as of 10/22/2023   No Known Allergies      Medication List        Accurate as of October 22, 2023  8:16 AM. If you have any questions, ask your nurse or doctor.          allopurinol  300 MG tablet Commonly known as: ZYLOPRIM  Take 1 tablet (300 mg total) by mouth daily.   amLODipine  10 MG tablet Commonly known as: NORVASC  Take 1 tablet (10 mg total) by mouth daily.   atorvastatin  80 MG tablet Commonly known as: LIPITOR TAKE 1 TABLET BY MOUTH EVERY DAY AT 6 PM   baclofen  10 MG tablet Commonly known as: LIORESAL  TAKE 1 TABLET BY MOUTH THREE TIMES A DAY   carvedilol  6.25 MG tablet Commonly known as: COREG  Take 1 tablet (6.25 mg total) by mouth 2 (two) times daily with a meal.   Cinnamon 500 MG capsule Take 1,000 mg by mouth daily.   clopidogrel  75 MG tablet Commonly known as: PLAVIX  Take 1 tablet (75 mg total) by mouth daily.   diclofenac  75 MG EC tablet Commonly known as: VOLTAREN  Take 1 tablet (75 mg total) by mouth 2 (two) times daily.  ferrous sulfate 325 (65 FE) MG tablet Take 325 mg by mouth daily with breakfast.   sertraline  50 MG tablet Commonly known as: ZOLOFT  Take 1 tablet (50 mg total) by mouth daily.   terbinafine  250 MG tablet Commonly known as: LAMISIL  Take 1 tablet (250 mg total) by mouth daily.         Objective:   BP (!) 119/58   Pulse (!) 51   Ht 5' 4 (1.626 m)   Wt 182 lb (82.6 kg)   SpO2 97%   BMI 31.24 kg/m   Wt Readings from Last 3 Encounters:  10/22/23 182 lb (82.6 kg)  09/25/23 185 lb (83.9 kg)  07/17/23 185 lb (83.9 kg)    Physical Exam Physical Exam   VITALS: BP-  119/58 NECK: Thyroid normal on palpation. CHEST: Lungs clear to auscultation bilaterally. CARDIOVASCULAR: Heart regular rate and rhythm, no murmurs. EXTREMITIES: Extremities without edema.     Right arm weakness stroke, 1 out of 5 strength    Assessment & Plan:   Problem List Items Addressed This Visit       Cardiovascular and Mediastinum   Essential hypertension - Primary     Nervous and Auditory   Spastic hemiplegia affecting dominant side (HCC)     Genitourinary   CKD (chronic kidney disease) stage 3, GFR 30-59 ml/min (HCC)   Relevant Orders   Microalbumin/Creatinine Ratio, Urine   Bayer DCA Hb A1c Waived     Other   Hyperlipidemia   Prediabetes   Relevant Orders   Microalbumin/Creatinine Ratio, Urine   Bayer DCA Hb A1c Waived   History of stroke   Relevant Medications   clopidogrel  (PLAVIX ) 75 MG tablet        Constipation Intermittent constipation likely due to clopidogrel  and iron. - Use Miralax  as needed.  Hypertension Well-controlled with amlodipine  and carvedilol . BP 119/58 mmHg. - Continue amlodipine  and carvedilol  as prescribed.  Hyperlipidemia Well-managed with atorvastatin . - Continue atorvastatin  as prescribed.  Depression and anxiety Stable mood with sertraline . No significant issues reported. - Continue sertraline  as prescribed.  General Health Maintenance Tetanus vaccination due. - Administer tetanus vaccination today. - Discussed potential post-vaccination symptoms. - Advised to keep arm moving to reduce soreness.          Follow up plan: Return in about 3 months (around 01/22/2024), or if symptoms worsen or fail to improve, for Prediabetes and hypertension.  Counseling provided for all of the vaccine components Orders Placed This Encounter  Procedures   Microalbumin/Creatinine Ratio, Urine   Bayer DCA Hb A1c Waived    Fonda Levins, MD [facility] 10/22/2023, 8:16 AM

## 2023-10-24 LAB — MICROALBUMIN / CREATININE URINE RATIO
Creatinine, Urine: 150.1 mg/dL
Microalb/Creat Ratio: 47 mg/g{creat} — ABNORMAL HIGH (ref 0–29)
Microalbumin, Urine: 70.7 ug/mL

## 2023-11-04 ENCOUNTER — Ambulatory Visit: Payer: Self-pay | Admitting: Family Medicine

## 2023-11-09 DIAGNOSIS — E119 Type 2 diabetes mellitus without complications: Secondary | ICD-10-CM | POA: Diagnosis not present

## 2023-12-09 DIAGNOSIS — E119 Type 2 diabetes mellitus without complications: Secondary | ICD-10-CM | POA: Diagnosis not present

## 2024-01-06 DIAGNOSIS — L570 Actinic keratosis: Secondary | ICD-10-CM | POA: Diagnosis not present

## 2024-01-09 DIAGNOSIS — E119 Type 2 diabetes mellitus without complications: Secondary | ICD-10-CM | POA: Diagnosis not present

## 2024-01-27 ENCOUNTER — Ambulatory Visit (INDEPENDENT_AMBULATORY_CARE_PROVIDER_SITE_OTHER): Payer: Self-pay | Admitting: Family Medicine

## 2024-01-27 ENCOUNTER — Encounter: Payer: Self-pay | Admitting: Family Medicine

## 2024-01-27 VITALS — BP 130/58 | HR 56 | Temp 97.6°F | Ht 64.0 in | Wt 183.0 lb

## 2024-01-27 DIAGNOSIS — R7303 Prediabetes: Secondary | ICD-10-CM

## 2024-01-27 DIAGNOSIS — I10 Essential (primary) hypertension: Secondary | ICD-10-CM | POA: Diagnosis not present

## 2024-01-27 DIAGNOSIS — E782 Mixed hyperlipidemia: Secondary | ICD-10-CM

## 2024-01-27 DIAGNOSIS — N1831 Chronic kidney disease, stage 3a: Secondary | ICD-10-CM | POA: Diagnosis not present

## 2024-01-27 LAB — LIPID PANEL

## 2024-01-27 LAB — BAYER DCA HB A1C WAIVED: HB A1C (BAYER DCA - WAIVED): 5.4 % (ref 4.8–5.6)

## 2024-01-27 NOTE — Progress Notes (Signed)
 BP (!) 130/58   Pulse (!) 56   Temp 97.6 F (36.4 C)   Ht 5' 4 (1.626 m)   Wt 183 lb (83 kg)   SpO2 98%   BMI 31.41 kg/m    Subjective:   Patient ID: Bruce Guerrero, male    DOB: Jul 24, 1960, 63 y.o.   MRN: [id number]  HPI: Bruce Guerrero is a 63 y.o. male presenting on 01/27/2024 for Medical Management of Chronic Issues, Hypertension, Hyperlipidemia, and Prediabetes   Discussed the use of AI scribe software for clinical note transcription with the patient, who gave verbal consent to proceed.  History of Present Illness   Bruce Guerrero is a 63 year old male who presents with left shoulder pain.  Left shoulder pain - Duration over one month - Pain exacerbated by arm movement from back to forward - Pain localized to anterior aspect of left shoulder - Overhead movements cause mild discomfort - No significant pain with strength exertion - No radiation of pain  Bruising and bleeding symptoms - Occasional bruising present - No significant bleeding issues - No blood in stool  Cardiovascular and metabolic status - On clopidogrel  for cardiovascular disease prevention - Blood pressure 130/58 mmHg - Blood sugar well-controlled with A1c of 5.4 - No history of diabetes  Mood and anxiety - On sertraline  for mood and anxiety management          Relevant past medical, surgical, family and social history reviewed and updated as indicated. Interim medical history since our last visit reviewed. Allergies and medications reviewed and updated.  Review of Systems  Constitutional:  Negative for chills and fever.  Eyes:  Negative for visual disturbance.  Respiratory:  Negative for shortness of breath and wheezing.   Cardiovascular:  Negative for chest pain and leg swelling.  Musculoskeletal:  Positive for arthralgias and myalgias. Negative for back pain and gait problem.  Skin:  Negative for rash.  Neurological:  Positive for weakness (Right weakness, chronic  from a stroke.). Negative for dizziness and numbness.  All other systems reviewed and are negative.   Per HPI unless specifically indicated above   Allergies as of 01/27/2024   No Known Allergies      Medication List        Accurate as of January 27, 2024 10:07 AM. If you have any questions, ask your nurse or doctor.          allopurinol  300 MG tablet Commonly known as: ZYLOPRIM  Take 1 tablet (300 mg total) by mouth daily.   amLODipine  10 MG tablet Commonly known as: NORVASC  Take 1 tablet (10 mg total) by mouth daily.   atorvastatin  80 MG tablet Commonly known as: LIPITOR TAKE 1 TABLET BY MOUTH EVERY DAY AT 6 PM   baclofen  10 MG tablet Commonly known as: LIORESAL  TAKE 1 TABLET BY MOUTH THREE TIMES A DAY   carvedilol  6.25 MG tablet Commonly known as: COREG  Take 1 tablet (6.25 mg total) by mouth 2 (two) times daily with a meal.   Cinnamon 500 MG capsule Take 1,000 mg by mouth daily.   clopidogrel  75 MG tablet Commonly known as: PLAVIX  Take 1 tablet (75 mg total) by mouth daily.   diclofenac  75 MG EC tablet Commonly known as: VOLTAREN  Take 1 tablet (75 mg total) by mouth 2 (two) times daily.   ferrous sulfate 325 (65 FE) MG tablet Take 325 mg by mouth daily with breakfast.   sertraline  50 MG tablet Commonly known as:  ZOLOFT  Take 1 tablet (50 mg total) by mouth daily.   terbinafine  250 MG tablet Commonly known as: LAMISIL  Take 1 tablet (250 mg total) by mouth daily.         Objective:   BP (!) 130/58   Pulse (!) 56   Temp 97.6 F (36.4 C)   Ht 5' 4 (1.626 m)   Wt 183 lb (83 kg)   SpO2 98%   BMI 31.41 kg/m   Wt Readings from Last 3 Encounters:  01/27/24 183 lb (83 kg)  10/22/23 182 lb (82.6 kg)  09/25/23 185 lb (83.9 kg)    Physical Exam Vitals and nursing note reviewed.  Constitutional:      General: He is not in acute distress.    Appearance: He is well-developed. He is not diaphoretic.  Eyes:     General: No scleral icterus.     Conjunctiva/sclera: Conjunctivae normal.  Neck:     Thyroid: No thyromegaly.  Cardiovascular:     Rate and Rhythm: Normal rate and regular rhythm.     Heart sounds: Normal heart sounds. No murmur heard. Pulmonary:     Effort: Pulmonary effort is normal. No respiratory distress.     Breath sounds: Normal breath sounds. No wheezing.  Musculoskeletal:        General: Normal range of motion.     Left shoulder: Tenderness present. No deformity, effusion, bony tenderness or crepitus. Normal range of motion. Normal strength. Normal pulse.     Cervical back: Neck supple.  Lymphadenopathy:     Cervical: No cervical adenopathy.  Skin:    General: Skin is warm and dry.     Findings: No rash.  Neurological:     Mental Status: He is alert and oriented to person, place, and time.     Coordination: Coordination normal.  Psychiatric:        Behavior: Behavior normal.    Physical Exam   VITALS: BP- 130/58 NECK: Thyroid normal, no masses. CHEST: Lungs clear to auscultation bilaterally. CARDIOVASCULAR: Heart regular rate and rhythm, normal heart sounds. MUSCULOSKELETAL: Tenderness in the front of the left shoulder. No pain with resisted abduction, adduction, or abduction of left shoulder. Pain with resisted pronation of left shoulder.         Assessment & Plan:   Problem List Items Addressed This Visit       Cardiovascular and Mediastinum   Essential hypertension   Relevant Orders   CBC with Differential/Platelet   CMP14+EGFR   Lipid panel   TSH   Bayer DCA Hb A1c Waived     Genitourinary   CKD (chronic kidney disease) stage 3, GFR 30-59 ml/min (HCC) - Primary   Relevant Orders   CBC with Differential/Platelet   CMP14+EGFR   Lipid panel   TSH   Bayer DCA Hb A1c Waived     Other   Hyperlipidemia   Relevant Orders   CBC with Differential/Platelet   CMP14+EGFR   Lipid panel   TSH   Bayer DCA Hb A1c Waived   Prediabetes   Relevant Orders   CBC with Differential/Platelet    CMP14+EGFR   Lipid panel   TSH   Bayer DCA Hb A1c Waived       Left shoulder biceps tendinitis Chronic left shoulder pain consistent with biceps tendinitis, likely due to repetitive strain. Limited anti-inflammatory use due to clopidogrel . - Prescribed prednisone  5-day course, 2 pills daily with food. - Advised follow-up if no improvement for potential physical therapy referral.  Prediabetes A1c at 5.4, indicating excellent glycemic control. - Continue monitoring A1c levels.  Essential hypertension Blood pressure well-controlled at 130/58 mmHg.  Mixed hyperlipidemia Partial changes in management during this visit.          Follow up plan: Return in about 3 months (around 04/28/2024), or if symptoms worsen or fail to improve, for Prediabetes and CKD and hypertension.  Counseling provided for all of the vaccine components Orders Placed This Encounter  Procedures   CBC with Differential/Platelet   CMP14+EGFR   Lipid panel   TSH   Bayer DCA Hb A1c Waived    Fonda Levins, MD [facility] 01/27/2024, 10:07 AM

## 2024-01-28 ENCOUNTER — Other Ambulatory Visit: Payer: Self-pay | Admitting: Family Medicine

## 2024-01-28 ENCOUNTER — Telehealth: Payer: Self-pay | Admitting: Family Medicine

## 2024-01-28 DIAGNOSIS — M545 Low back pain, unspecified: Secondary | ICD-10-CM

## 2024-01-28 DIAGNOSIS — S161XXD Strain of muscle, fascia and tendon at neck level, subsequent encounter: Secondary | ICD-10-CM

## 2024-01-28 LAB — CMP14+EGFR
ALT: 26 IU/L (ref 0–44)
AST: 22 IU/L (ref 0–40)
Albumin: 4.5 g/dL (ref 3.9–4.9)
Alkaline Phosphatase: 93 IU/L (ref 47–123)
BUN/Creatinine Ratio: 20 (ref 10–24)
BUN: 27 mg/dL (ref 8–27)
Bilirubin Total: 0.3 mg/dL (ref 0.0–1.2)
CO2: 24 mmol/L (ref 20–29)
Calcium: 10.7 mg/dL — AB (ref 8.6–10.2)
Chloride: 103 mmol/L (ref 96–106)
Creatinine, Ser: 1.36 mg/dL — AB (ref 0.76–1.27)
Globulin, Total: 2 g/dL (ref 1.5–4.5)
Glucose: 127 mg/dL — AB (ref 70–99)
Potassium: 4.6 mmol/L (ref 3.5–5.2)
Sodium: 142 mmol/L (ref 134–144)
Total Protein: 6.5 g/dL (ref 6.0–8.5)
eGFR: 59 mL/min/1.73 — AB (ref >59)

## 2024-01-28 LAB — TSH: TSH: 1.18 u[IU]/mL (ref 0.450–4.500)

## 2024-01-28 LAB — LIPID PANEL
Cholesterol, Total: 101 mg/dL (ref 100–199)
HDL: 37 mg/dL — AB (ref >39)
LDL CALC COMMENT:: 2.7 ratio (ref 0.0–5.0)
LDL Chol Calc (NIH): 48 mg/dL (ref 0–99)
Triglycerides: 77 mg/dL (ref 0–149)
VLDL Cholesterol Cal: 16 mg/dL (ref 5–40)

## 2024-01-28 LAB — CBC WITH DIFFERENTIAL/PLATELET
Basophils Absolute: 0 x10E3/uL (ref 0.0–0.2)
Basos: 1 %
EOS (ABSOLUTE): 0.1 x10E3/uL (ref 0.0–0.4)
Eos: 1 %
Hematocrit: 41.6 % (ref 37.5–51.0)
Hemoglobin: 13.9 g/dL (ref 13.0–17.7)
Immature Grans (Abs): 0 x10E3/uL (ref 0.0–0.1)
Immature Granulocytes: 0 %
Lymphocytes Absolute: 1.4 x10E3/uL (ref 0.7–3.1)
Lymphs: 24 %
MCH: 32.9 pg (ref 26.6–33.0)
MCHC: 33.4 g/dL (ref 31.5–35.7)
MCV: 98 fL — ABNORMAL HIGH (ref 79–97)
Monocytes Absolute: 0.4 x10E3/uL (ref 0.1–0.9)
Monocytes: 7 %
Neutrophils Absolute: 4.1 x10E3/uL (ref 1.4–7.0)
Neutrophils: 67 %
Platelets: 185 x10E3/uL (ref 150–450)
RBC: 4.23 x10E6/uL (ref 4.14–5.80)
RDW: 12.5 % (ref 11.6–15.4)
WBC: 6 x10E3/uL (ref 3.4–10.8)

## 2024-01-28 MED ORDER — PREDNISONE 20 MG PO TABS: ORAL_TABLET | ORAL | 0 refills | Status: DC

## 2024-01-28 NOTE — Telephone Encounter (Signed)
 Patient was seen yesterday and asking for prednisone  for shoulder. States he mentioned it to Dettinger at the appt. Please call patient.

## 2024-01-28 NOTE — Telephone Encounter (Signed)
Sent prednisone for the patient 

## 2024-01-28 NOTE — Telephone Encounter (Signed)
Left message making pt aware. 

## 2024-02-03 ENCOUNTER — Ambulatory Visit: Payer: Self-pay | Admitting: Family Medicine

## 2024-03-13 ENCOUNTER — Other Ambulatory Visit: Payer: Self-pay | Admitting: Family Medicine

## 2024-03-13 DIAGNOSIS — E1169 Type 2 diabetes mellitus with other specified complication: Secondary | ICD-10-CM

## 2024-03-13 DIAGNOSIS — E782 Mixed hyperlipidemia: Secondary | ICD-10-CM

## 2024-03-13 DIAGNOSIS — Z8673 Personal history of transient ischemic attack (TIA), and cerebral infarction without residual deficits: Secondary | ICD-10-CM

## 2024-03-14 ENCOUNTER — Ambulatory Visit: Payer: Self-pay

## 2024-03-14 NOTE — Telephone Encounter (Signed)
 FYI Only or Action Required?: FYI only for provider: appointment scheduled on 1/7.  Patient was last seen in primary care on 01/27/2024 by Dettinger, Fonda LABOR, MD.  Called Nurse Triage reporting Shoulder Pain.  Symptoms began several days ago.  Interventions attempted: Prescription medications: shot at [facility], also tried diclofenac .  Symptoms are: gradually worsening.  Triage Disposition: See PCP When Office is Open (Within 3 Days)  Patient/caregiver understands and will follow disposition?: Yes, will follow disposition  Copied from CRM #[id number]. Topic: Clinical - Red Word Triage >> Mar 14, 2024  3:51 PM Delon HERO wrote: Red Word that prompted transfer to Nurse Triage: Patient is calling to report severe back pain,shoulder. Very fatigue and weak.  Went to [facility] yesterday given shot. Got better but did not go away.  Extense pain has returned.  Pain started Saturday morning when woke. Reason for Disposition  [1] MODERATE pain (e.g., interferes with normal activities) AND [2] present > 3 days  Answer Assessment - Initial Assessment Questions 1. ONSET: When did the pain start?     Shoulder pain for awhile, but now it is in my back and wrist 2. LOCATION: Where is the pain located?     L 3. PAIN: How bad is the pain? (Scale 1-10; or mild, moderate, severe)     7 4. WORK OR EXERCISE: Has there been any recent work or exercise that involved this part of the body?     Did do some work that could have injured the arm last week 5. CAUSE: What do you think is causing the shoulder pain?     Unsure, [facility] at [facility], no EKG was done,  6. OTHER SYMPTOMS: Do you have any other symptoms? (e.g., neck pain, swelling, rash, fever, numbness, weakness)     Fatigue and weakness in that arm/back. PT denies SOB, difficulty breathing, denies CP, denies dizziness/lightheadedness  Protocols used: Shoulder Pain-A-AH

## 2024-03-16 ENCOUNTER — Ambulatory Visit

## 2024-03-16 ENCOUNTER — Encounter: Payer: Self-pay | Admitting: Family Medicine

## 2024-03-16 ENCOUNTER — Ambulatory Visit (INDEPENDENT_AMBULATORY_CARE_PROVIDER_SITE_OTHER)

## 2024-03-16 VITALS — BP 128/62 | HR 63 | Temp 98.2°F | Ht 64.0 in | Wt 184.8 lb

## 2024-03-16 DIAGNOSIS — G8110 Spastic hemiplegia affecting unspecified side: Secondary | ICD-10-CM

## 2024-03-16 DIAGNOSIS — M25512 Pain in left shoulder: Secondary | ICD-10-CM

## 2024-03-16 DIAGNOSIS — M79602 Pain in left arm: Secondary | ICD-10-CM

## 2024-03-16 MED ORDER — PREDNISONE 20 MG PO TABS: 40.0000 mg | ORAL_TABLET | Freq: Every day | ORAL | 0 refills | Status: AC

## 2024-03-16 NOTE — Progress Notes (Signed)
 "  Acute Office Visit  Subjective:     Patient ID: Bruce Guerrero, male    DOB: 04/14/1960, 64 y.o.   MRN: [id number]  Chief Complaint  Patient presents with   Neck Pain    HPI  History of Present Illness   Bruce Guerrero is a 64 year old male who presents with left upper back and shoulder pain radiating to the left arm.  Left upper back, shoulder, and arm pain - Pain localized to the left posterior shoulder, radiating down the left shoulder and arm - Onset upon waking on Saturday morning, approximately four days ago - Received a steroid injection at urgent care over the weekend, resulting in partial relief - Persistent aching in the shoulder and arm despite treatment - Pain affects ability to use his only good hand - Decreased strength in the left hand - Hand 'doesn't feel right' and has been dropping objects - No numbness or tingling in the arm  Prior musculoskeletal symptoms and treatments - History of pain in the left shoulder joint prior to current episode, previously exacerbated by movements such as putting on a coat - Previous physical therapy for neck pain about one year ago, with improvement over time - No recent neck pain of note - No prior neck x-rays  Medication use and response - Currently taking diclofenac  as needed for inflammation - Diclofenac  has not been effective in alleviating symptoms       ROS As per HPI.      Objective:    BP 128/62   Pulse 63   Temp 98.2 F (36.8 C) (Temporal)   Ht 5' 4 (1.626 m)   Wt 184 lb 12.8 oz (83.8 kg)   SpO2 97%   BMI 31.72 kg/m    Physical Exam Vitals and nursing note reviewed.  Constitutional:      General: He is not in acute distress.    Appearance: He is not ill-appearing, toxic-appearing or diaphoretic.  Musculoskeletal:     Left shoulder: Tenderness present. No swelling, effusion, bony tenderness or crepitus.     Left upper arm: No swelling or edema.     Left elbow: No swelling or  deformity.     Left forearm: No swelling or edema.     Left wrist: No swelling, deformity or effusion.     Left hand: No swelling, deformity or tenderness. Normal range of motion. Normal strength. Normal capillary refill.     Cervical back: No edema, erythema or rigidity. No spinous process tenderness or muscular tenderness.     Comments: + empty can test of left shoulder  Neurological:     Mental Status: He is alert.     Motor: Weakness (right side) present.  Psychiatric:        Mood and Affect: Mood normal.        Behavior: Behavior normal.     No results found for any visits on 03/16/24.      Assessment & Plan:   Bruce Guerrero was seen today for neck pain.  Diagnoses and all orders for this visit:  Acute pain of left shoulder -     DG Shoulder Left; Future -     DG Cervical Spine 2 or 3 views; Future -     predniSONE  (DELTASONE ) 20 MG tablet; Take 2 tablets (40 mg total) by mouth daily with breakfast for 5 days.  Left arm pain -     DG Shoulder Left; Future -  DG Cervical Spine 2 or 3 views; Future -     predniSONE  (DELTASONE ) 20 MG tablet; Take 2 tablets (40 mg total) by mouth daily with breakfast for 5 days.  Spastic hemiplegia affecting dominant side (HCC) Affecting right side  Assessment and Plan    Acute left shoulder and arm pain Acute left shoulder and arm pain with weakness, likely due to inflammation. Differential includes shoulder pathology versus cervical radiculopathy. Steroid IM injection provided partial relief. - Ordered x-ray of left shoulder and neck to assess structural abnormalities, signs of possible nerve compression. - Prescribed prednisone  40 mg daily for 5 days. - Advised against diclofenac  while on prednisone ; resume if pain persists post-prednisone . - Instructed to rest affected arm. - Follow up with x-ray results for further management.     Will notify of xray reports and plan of care pending reports.   Return to office for new or  worsening symptoms, or if symptoms persist.    Bruce CHRISTELLA Search, FNP   "

## 2024-03-17 ENCOUNTER — Ambulatory Visit: Payer: Self-pay | Admitting: Family Medicine

## 2024-03-17 NOTE — Telephone Encounter (Signed)
 Patient is returning call from the office about x-ray results. Please call back when available.

## 2024-03-21 ENCOUNTER — Other Ambulatory Visit: Payer: Self-pay | Admitting: Family Medicine

## 2024-03-21 DIAGNOSIS — M62838 Other muscle spasm: Secondary | ICD-10-CM

## 2024-03-22 ENCOUNTER — Telehealth: Payer: Self-pay | Admitting: Family Medicine

## 2024-03-22 DIAGNOSIS — M19012 Primary osteoarthritis, left shoulder: Secondary | ICD-10-CM

## 2024-03-22 NOTE — Telephone Encounter (Signed)
 Pt seen Bruce Search, NP for this concern in the office on  03/16/2024. Per xray result note if sx's did not improve a ortho referral is recommended.  Referral placed. Pt aware.

## 2024-03-22 NOTE — Telephone Encounter (Signed)
 Copied from CRM [id number]. Topic: Referral - Request for Referral >> Mar 22, 2024  8:57 AM Everette C wrote: Did the patient discuss referral with their provider in the last year? No (If No - schedule appointment) (If Yes - send message)  Appointment offered? No  Type of order/referral and detailed reason for visit: Orthopedic Specialist, left shoulder, arm and hand concerns   Preference of office, provider, location: Patient would like to be seen by [facility]   If referral order, have you been seen by this specialty before? Yes (If Yes, this issue or another issue? When? Where?  Can we respond through MyChart? No

## 2024-03-24 ENCOUNTER — Ambulatory Visit: Payer: Self-pay | Admitting: Family Medicine

## 2024-03-24 ENCOUNTER — Telehealth: Payer: Self-pay | Admitting: Family Medicine

## 2024-03-24 ENCOUNTER — Other Ambulatory Visit: Payer: Self-pay | Admitting: Family Medicine

## 2024-03-24 DIAGNOSIS — M79602 Pain in left arm: Secondary | ICD-10-CM

## 2024-03-24 DIAGNOSIS — M25512 Pain in left shoulder: Secondary | ICD-10-CM

## 2024-03-24 NOTE — Telephone Encounter (Unsigned)
 Copied from CRM [id number]. Topic: Clinical - Medication Refill >> Mar 24, 2024  1:06 PM Larissa S wrote: Medication: predniSONE  (DELTASONE ) 20 MG tablet  Has the patient contacted their pharmacy? Yes (Agent: If no, request that the patient contact the pharmacy for the refill. If patient does not wish to contact the pharmacy document the reason why and proceed with request.) (Agent: If yes, when and what did the pharmacy advise?)  This is the patient's preferred pharmacy:  [id number] - [place], [place] - [address] [place] [address] Phone: [phone] Fax: [phone]  Is this the correct pharmacy for this prescription? Yes If no, delete pharmacy and type the correct one.   Has the prescription been filled recently? No  Is the patient out of the medication? Yes  Has the patient been seen for an appointment in the last year OR does the patient have an upcoming appointment? Yes  Can we respond through MyChart? No  Agent: Please be advised that Rx refills may take up to 3 business days. We ask that you follow-up with your pharmacy.

## 2024-03-24 NOTE — Telephone Encounter (Signed)
 Attempted to contact patient x 1 to discuss refill request for Prednisone  which was written for 5 days only; LVM to return call, Will attempt to contact patient at a later time to further discuss concerns.       Copied from CRM [id number]. Topic: Clinical - Medication Refill >> Mar 24, 2024  1:06 PM Larissa S wrote: Medication: predniSONE  (DELTASONE ) 20 MG tablet

## 2024-03-24 NOTE — Telephone Encounter (Signed)
 FYI Only or Action Required?: Action required by provider: medication refill request.  Patient was last seen in primary care on 03/16/2024 by Bruce Annabella HERO, FNP.  Called Nurse Triage reporting Shoulder Pain.   Triage Disposition: See HCP Within 4 Hours (Or PCP Triage)  Patient/caregiver understands and will follow disposition?: No, wishes to speak with PCP    Attempted to contact patient x 1 to discuss refill request for Prednisone  which was written for 5 days only; LVM to return call, Will attempt to contact patient at a later time to further discuss concerns.        Reason for Disposition  Weakness (i.e., loss of strength) in hand or fingers  (Exception: Not truly weak; hand feels weak because of pain.)  Answer Assessment - Initial Assessment Questions 1. ONSET: When did the pain start?     X 2 weeks   2. LOCATION: Where is the pain located?     Left shoulder  3. PAIN: How bad is the pain? (Scale 1-10; or mild, moderate, severe)     8/10 at night   4. WORK OR EXERCISE: Has there been any recent work or exercise that involved this part of the body?     Denies  5. CAUSE: What do you think is causing the shoulder pain?     Unsure  6. OTHER SYMPTOMS: Pt denies neck pain, swelling, rash, fever, numbness  Pt states he has weakness in left hand       Pt reports Left shoulder pain Pt requesting a refill on Prednisone  stating he has an appt to see Ortho next week Routing to care team for assistance. Pt agrees with plan of care, will call back for any worsening symptoms  Protocols used: Shoulder Pain-A-AH

## 2024-03-25 ENCOUNTER — Other Ambulatory Visit: Payer: Self-pay

## 2024-03-25 ENCOUNTER — Telehealth: Payer: Self-pay | Admitting: Family Medicine

## 2024-03-25 DIAGNOSIS — S161XXD Strain of muscle, fascia and tendon at neck level, subsequent encounter: Secondary | ICD-10-CM

## 2024-03-25 DIAGNOSIS — M545 Low back pain, unspecified: Secondary | ICD-10-CM

## 2024-03-25 MED ORDER — PREDNISONE 20 MG PO TABS: ORAL_TABLET | ORAL | 0 refills | Status: AC

## 2024-03-25 NOTE — Telephone Encounter (Signed)
 Pt made aware. States that he has an appt with Bone Doctor next week.

## 2024-03-25 NOTE — Telephone Encounter (Signed)
 Copied from CRM [id number]. Topic: Clinical - Medication Question >> Mar 25, 2024  4:09 PM Sophia H wrote: Reason for CRM: Following up on request for prednisone . States he would like to speak with nurse to clarify since he was told by her meds would be sent in and pharmacy hasn't received anything. Please reach out # [phone]

## 2024-03-25 NOTE — Telephone Encounter (Signed)
 Given 1 refill on the prednisone , 5 more days

## 2024-03-25 NOTE — Telephone Encounter (Signed)
 Per Dettinger ok to send [facility]mg   [facility] [place]. Pt aware.

## 2024-05-04 ENCOUNTER — Ambulatory Visit: Admitting: Family Medicine

## 2024-09-27 ENCOUNTER — Ambulatory Visit: Payer: Self-pay
